# Patient Record
Sex: Female | Born: 1980 | Race: White | Hispanic: No | Marital: Single | Smoking: Current every day smoker
Health system: Southern US, Community
[De-identification: ages and names within clinical notes are randomized; demographics above are authoritative.]

## PROBLEM LIST (undated history)

## (undated) DIAGNOSIS — Z9889 Other specified postprocedural states: Secondary | ICD-10-CM

## (undated) DIAGNOSIS — N3010 Interstitial cystitis (chronic) without hematuria: Secondary | ICD-10-CM

## (undated) DIAGNOSIS — B977 Papillomavirus as the cause of diseases classified elsewhere: Secondary | ICD-10-CM

## (undated) DIAGNOSIS — R0600 Dyspnea, unspecified: Secondary | ICD-10-CM

## (undated) DIAGNOSIS — R569 Unspecified convulsions: Secondary | ICD-10-CM

## (undated) DIAGNOSIS — I10 Essential (primary) hypertension: Secondary | ICD-10-CM

## (undated) DIAGNOSIS — M797 Fibromyalgia: Secondary | ICD-10-CM

## (undated) DIAGNOSIS — E282 Polycystic ovarian syndrome: Secondary | ICD-10-CM

## (undated) DIAGNOSIS — K219 Gastro-esophageal reflux disease without esophagitis: Secondary | ICD-10-CM

## (undated) DIAGNOSIS — G8929 Other chronic pain: Secondary | ICD-10-CM

## (undated) DIAGNOSIS — R109 Unspecified abdominal pain: Secondary | ICD-10-CM

## (undated) DIAGNOSIS — K449 Diaphragmatic hernia without obstruction or gangrene: Secondary | ICD-10-CM

## (undated) DIAGNOSIS — R112 Nausea with vomiting, unspecified: Secondary | ICD-10-CM

## (undated) DIAGNOSIS — R0609 Other forms of dyspnea: Secondary | ICD-10-CM

## (undated) DIAGNOSIS — F419 Anxiety disorder, unspecified: Secondary | ICD-10-CM

## (undated) DIAGNOSIS — G2581 Restless legs syndrome: Secondary | ICD-10-CM

## (undated) DIAGNOSIS — M549 Dorsalgia, unspecified: Secondary | ICD-10-CM

## (undated) DIAGNOSIS — Z9289 Personal history of other medical treatment: Secondary | ICD-10-CM

## (undated) DIAGNOSIS — R609 Edema, unspecified: Secondary | ICD-10-CM

## (undated) DIAGNOSIS — N200 Calculus of kidney: Secondary | ICD-10-CM

## (undated) DIAGNOSIS — K589 Irritable bowel syndrome without diarrhea: Secondary | ICD-10-CM

## (undated) DIAGNOSIS — R51 Headache: Secondary | ICD-10-CM

## (undated) DIAGNOSIS — E119 Type 2 diabetes mellitus without complications: Secondary | ICD-10-CM

## (undated) HISTORY — PX: ESOPHAGOGASTRODUODENOSCOPY ENDOSCOPY: SHX5814

## (undated) HISTORY — PX: TYMPANOPLASTY: SHX33

## (undated) HISTORY — DX: Dyspnea, unspecified: R06.00

## (undated) HISTORY — DX: Other forms of dyspnea: R06.09

## (undated) HISTORY — DX: Edema, unspecified: R60.9

## (undated) HISTORY — PX: CHOLECYSTECTOMY: SHX55

## (undated) HISTORY — PX: TUBAL LIGATION: SHX77

## (undated) HISTORY — PX: TONSILLECTOMY: SUR1361

---

## 1999-04-21 ENCOUNTER — Other Ambulatory Visit: Admission: RE | Admit: 1999-04-21 | Discharge: 1999-04-21 | Payer: Self-pay | Admitting: Obstetrics and Gynecology

## 1999-08-06 ENCOUNTER — Inpatient Hospital Stay (HOSPITAL_COMMUNITY): Admission: AD | Admit: 1999-08-06 | Discharge: 1999-08-06 | Payer: Self-pay | Admitting: *Deleted

## 1999-09-05 ENCOUNTER — Observation Stay (HOSPITAL_COMMUNITY): Admission: AD | Admit: 1999-09-05 | Discharge: 1999-09-06 | Payer: Self-pay | Admitting: Obstetrics and Gynecology

## 1999-09-22 ENCOUNTER — Inpatient Hospital Stay (HOSPITAL_COMMUNITY): Admission: AD | Admit: 1999-09-22 | Discharge: 1999-09-22 | Payer: Self-pay | Admitting: *Deleted

## 1999-09-29 ENCOUNTER — Inpatient Hospital Stay (HOSPITAL_COMMUNITY): Admission: AD | Admit: 1999-09-29 | Discharge: 1999-09-29 | Payer: Self-pay | Admitting: Obstetrics and Gynecology

## 1999-09-30 ENCOUNTER — Inpatient Hospital Stay (HOSPITAL_COMMUNITY): Admission: AD | Admit: 1999-09-30 | Discharge: 1999-09-30 | Payer: Self-pay | Admitting: *Deleted

## 1999-10-17 ENCOUNTER — Observation Stay (HOSPITAL_COMMUNITY): Admission: AD | Admit: 1999-10-17 | Discharge: 1999-10-18 | Payer: Self-pay | Admitting: *Deleted

## 1999-10-21 ENCOUNTER — Inpatient Hospital Stay (HOSPITAL_COMMUNITY): Admission: AD | Admit: 1999-10-21 | Discharge: 1999-10-21 | Payer: Self-pay | Admitting: Obstetrics & Gynecology

## 1999-11-04 ENCOUNTER — Inpatient Hospital Stay (HOSPITAL_COMMUNITY): Admission: AD | Admit: 1999-11-04 | Discharge: 1999-11-04 | Payer: Self-pay | Admitting: Obstetrics and Gynecology

## 1999-11-08 ENCOUNTER — Inpatient Hospital Stay (HOSPITAL_COMMUNITY): Admission: AD | Admit: 1999-11-08 | Discharge: 1999-11-11 | Payer: Self-pay | Admitting: Obstetrics & Gynecology

## 2000-02-28 ENCOUNTER — Encounter: Admission: RE | Admit: 2000-02-28 | Discharge: 2000-02-28 | Payer: Self-pay | Admitting: Obstetrics and Gynecology

## 2000-02-28 ENCOUNTER — Encounter: Payer: Self-pay | Admitting: Obstetrics and Gynecology

## 2001-04-20 ENCOUNTER — Emergency Department (HOSPITAL_COMMUNITY): Admission: EM | Admit: 2001-04-20 | Discharge: 2001-04-20 | Payer: Self-pay | Admitting: Emergency Medicine

## 2001-04-23 ENCOUNTER — Emergency Department (HOSPITAL_COMMUNITY): Admission: EM | Admit: 2001-04-23 | Discharge: 2001-04-23 | Payer: Self-pay | Admitting: Emergency Medicine

## 2001-04-26 ENCOUNTER — Emergency Department (HOSPITAL_COMMUNITY): Admission: EM | Admit: 2001-04-26 | Discharge: 2001-04-26 | Payer: Self-pay | Admitting: Emergency Medicine

## 2002-04-24 ENCOUNTER — Other Ambulatory Visit: Admission: RE | Admit: 2002-04-24 | Discharge: 2002-04-24 | Payer: Self-pay | Admitting: Obstetrics and Gynecology

## 2002-07-08 ENCOUNTER — Ambulatory Visit (HOSPITAL_COMMUNITY): Admission: RE | Admit: 2002-07-08 | Discharge: 2002-07-08 | Payer: Self-pay | Admitting: Obstetrics and Gynecology

## 2002-07-08 ENCOUNTER — Encounter: Payer: Self-pay | Admitting: Obstetrics and Gynecology

## 2002-10-22 ENCOUNTER — Encounter: Payer: Self-pay | Admitting: Obstetrics and Gynecology

## 2002-10-22 ENCOUNTER — Inpatient Hospital Stay (HOSPITAL_COMMUNITY): Admission: AD | Admit: 2002-10-22 | Discharge: 2002-10-22 | Payer: Self-pay | Admitting: Obstetrics and Gynecology

## 2002-10-27 ENCOUNTER — Inpatient Hospital Stay (HOSPITAL_COMMUNITY): Admission: AD | Admit: 2002-10-27 | Discharge: 2002-10-29 | Payer: Self-pay | Admitting: Obstetrics and Gynecology

## 2002-12-11 ENCOUNTER — Ambulatory Visit (HOSPITAL_COMMUNITY): Admission: RE | Admit: 2002-12-11 | Discharge: 2002-12-11 | Payer: Self-pay | Admitting: Obstetrics and Gynecology

## 2003-03-15 ENCOUNTER — Encounter: Payer: Self-pay | Admitting: Emergency Medicine

## 2003-03-15 ENCOUNTER — Emergency Department (HOSPITAL_COMMUNITY): Admission: EM | Admit: 2003-03-15 | Discharge: 2003-03-15 | Payer: Self-pay | Admitting: Emergency Medicine

## 2003-03-27 ENCOUNTER — Inpatient Hospital Stay (HOSPITAL_COMMUNITY): Admission: RE | Admit: 2003-03-27 | Discharge: 2003-03-28 | Payer: Self-pay | Admitting: Family Medicine

## 2003-03-27 ENCOUNTER — Encounter (INDEPENDENT_AMBULATORY_CARE_PROVIDER_SITE_OTHER): Payer: Self-pay

## 2003-03-27 ENCOUNTER — Encounter: Payer: Self-pay | Admitting: Surgery

## 2003-07-07 ENCOUNTER — Encounter: Payer: Self-pay | Admitting: Surgery

## 2003-07-07 ENCOUNTER — Encounter: Admission: RE | Admit: 2003-07-07 | Discharge: 2003-07-07 | Payer: Self-pay | Admitting: Surgery

## 2003-11-14 ENCOUNTER — Emergency Department (HOSPITAL_COMMUNITY): Admission: EM | Admit: 2003-11-14 | Discharge: 2003-11-14 | Payer: Self-pay | Admitting: Emergency Medicine

## 2003-11-25 ENCOUNTER — Emergency Department (HOSPITAL_COMMUNITY): Admission: EM | Admit: 2003-11-25 | Discharge: 2003-11-26 | Payer: Self-pay | Admitting: Emergency Medicine

## 2004-02-19 ENCOUNTER — Emergency Department (HOSPITAL_COMMUNITY): Admission: AD | Admit: 2004-02-19 | Discharge: 2004-02-19 | Payer: Self-pay | Admitting: Family Medicine

## 2004-07-11 ENCOUNTER — Ambulatory Visit (HOSPITAL_COMMUNITY): Admission: RE | Admit: 2004-07-11 | Discharge: 2004-07-11 | Payer: Self-pay | Admitting: Family Medicine

## 2004-11-08 ENCOUNTER — Emergency Department (HOSPITAL_COMMUNITY): Admission: EM | Admit: 2004-11-08 | Discharge: 2004-11-08 | Payer: Self-pay | Admitting: Family Medicine

## 2005-02-23 ENCOUNTER — Ambulatory Visit (HOSPITAL_COMMUNITY): Admission: RE | Admit: 2005-02-23 | Discharge: 2005-02-23 | Payer: Self-pay | Admitting: Family Medicine

## 2008-02-18 ENCOUNTER — Ambulatory Visit: Admission: RE | Admit: 2008-02-18 | Discharge: 2008-02-18 | Payer: Self-pay | Admitting: Family Medicine

## 2008-02-18 ENCOUNTER — Encounter (INDEPENDENT_AMBULATORY_CARE_PROVIDER_SITE_OTHER): Payer: Self-pay | Admitting: Family Medicine

## 2008-02-18 ENCOUNTER — Ambulatory Visit: Payer: Self-pay | Admitting: Vascular Surgery

## 2008-04-22 ENCOUNTER — Encounter: Admission: RE | Admit: 2008-04-22 | Discharge: 2008-04-22 | Payer: Self-pay | Admitting: Rheumatology

## 2008-04-23 ENCOUNTER — Emergency Department (HOSPITAL_COMMUNITY): Admission: EM | Admit: 2008-04-23 | Discharge: 2008-04-23 | Payer: Self-pay | Admitting: Emergency Medicine

## 2008-08-12 ENCOUNTER — Emergency Department (HOSPITAL_COMMUNITY): Admission: EM | Admit: 2008-08-12 | Discharge: 2008-08-12 | Payer: Self-pay | Admitting: Emergency Medicine

## 2008-12-10 ENCOUNTER — Emergency Department (HOSPITAL_COMMUNITY): Admission: EM | Admit: 2008-12-10 | Discharge: 2008-12-10 | Payer: Self-pay | Admitting: Emergency Medicine

## 2009-02-28 ENCOUNTER — Emergency Department (HOSPITAL_COMMUNITY): Admission: EM | Admit: 2009-02-28 | Discharge: 2009-02-28 | Payer: Self-pay | Admitting: Emergency Medicine

## 2009-03-16 ENCOUNTER — Emergency Department (HOSPITAL_COMMUNITY): Admission: EM | Admit: 2009-03-16 | Discharge: 2009-03-16 | Payer: Self-pay | Admitting: Emergency Medicine

## 2009-03-19 ENCOUNTER — Encounter: Admission: RE | Admit: 2009-03-19 | Discharge: 2009-03-19 | Payer: Self-pay | Admitting: Family Medicine

## 2009-03-23 ENCOUNTER — Emergency Department (HOSPITAL_COMMUNITY): Admission: EM | Admit: 2009-03-23 | Discharge: 2009-03-23 | Payer: Self-pay | Admitting: Emergency Medicine

## 2009-05-08 ENCOUNTER — Emergency Department (HOSPITAL_COMMUNITY): Admission: EM | Admit: 2009-05-08 | Discharge: 2009-05-09 | Payer: Self-pay | Admitting: Emergency Medicine

## 2009-05-16 ENCOUNTER — Emergency Department (HOSPITAL_COMMUNITY): Admission: EM | Admit: 2009-05-16 | Discharge: 2009-05-17 | Payer: Self-pay | Admitting: Emergency Medicine

## 2009-06-18 ENCOUNTER — Ambulatory Visit (HOSPITAL_COMMUNITY): Admission: RE | Admit: 2009-06-18 | Discharge: 2009-06-18 | Payer: Self-pay | Admitting: Urology

## 2009-08-20 ENCOUNTER — Emergency Department (HOSPITAL_COMMUNITY): Admission: EM | Admit: 2009-08-20 | Discharge: 2009-08-20 | Payer: Self-pay | Admitting: Emergency Medicine

## 2009-08-24 ENCOUNTER — Emergency Department (HOSPITAL_COMMUNITY): Admission: EM | Admit: 2009-08-24 | Discharge: 2009-08-24 | Payer: Self-pay | Admitting: Emergency Medicine

## 2009-10-13 ENCOUNTER — Emergency Department (HOSPITAL_COMMUNITY): Admission: EM | Admit: 2009-10-13 | Discharge: 2009-10-13 | Payer: Self-pay | Admitting: Emergency Medicine

## 2009-11-30 ENCOUNTER — Emergency Department (HOSPITAL_COMMUNITY): Admission: EM | Admit: 2009-11-30 | Discharge: 2009-11-30 | Payer: Self-pay | Admitting: Emergency Medicine

## 2010-02-13 ENCOUNTER — Emergency Department (HOSPITAL_COMMUNITY): Admission: EM | Admit: 2010-02-13 | Discharge: 2010-02-13 | Payer: Self-pay | Admitting: Emergency Medicine

## 2010-02-21 ENCOUNTER — Emergency Department (HOSPITAL_COMMUNITY): Admission: EM | Admit: 2010-02-21 | Discharge: 2010-02-21 | Payer: Self-pay | Admitting: Emergency Medicine

## 2010-03-21 ENCOUNTER — Emergency Department (HOSPITAL_COMMUNITY): Admission: EM | Admit: 2010-03-21 | Discharge: 2010-03-21 | Payer: Self-pay | Admitting: Emergency Medicine

## 2010-03-26 ENCOUNTER — Emergency Department (HOSPITAL_COMMUNITY): Admission: EM | Admit: 2010-03-26 | Discharge: 2010-03-26 | Payer: Self-pay | Admitting: Emergency Medicine

## 2010-04-16 ENCOUNTER — Encounter: Admission: RE | Admit: 2010-04-16 | Discharge: 2010-04-16 | Payer: Self-pay | Admitting: Internal Medicine

## 2010-07-18 ENCOUNTER — Emergency Department (HOSPITAL_COMMUNITY): Admission: EM | Admit: 2010-07-18 | Discharge: 2010-07-18 | Payer: Self-pay | Admitting: Emergency Medicine

## 2010-07-28 ENCOUNTER — Encounter: Admission: RE | Admit: 2010-07-28 | Discharge: 2010-07-28 | Payer: Self-pay | Admitting: Family Medicine

## 2010-08-18 ENCOUNTER — Emergency Department (HOSPITAL_COMMUNITY): Admission: EM | Admit: 2010-08-18 | Discharge: 2010-08-18 | Payer: Self-pay | Admitting: Emergency Medicine

## 2010-08-29 ENCOUNTER — Emergency Department (HOSPITAL_COMMUNITY): Admission: EM | Admit: 2010-08-29 | Discharge: 2010-08-29 | Payer: Self-pay | Admitting: Emergency Medicine

## 2010-09-15 ENCOUNTER — Emergency Department (HOSPITAL_COMMUNITY): Admission: EM | Admit: 2010-09-15 | Discharge: 2010-09-15 | Payer: Self-pay | Admitting: Emergency Medicine

## 2010-09-23 ENCOUNTER — Ambulatory Visit (HOSPITAL_BASED_OUTPATIENT_CLINIC_OR_DEPARTMENT_OTHER): Admission: RE | Admit: 2010-09-23 | Discharge: 2010-09-23 | Payer: Self-pay | Admitting: Urology

## 2010-11-30 ENCOUNTER — Encounter
Admission: RE | Admit: 2010-11-30 | Discharge: 2010-12-20 | Payer: Self-pay | Source: Home / Self Care | Attending: Physical Medicine & Rehabilitation | Admitting: Physical Medicine & Rehabilitation

## 2010-12-02 ENCOUNTER — Ambulatory Visit
Admission: RE | Admit: 2010-12-02 | Discharge: 2010-12-02 | Payer: Self-pay | Source: Home / Self Care | Attending: Physical Medicine & Rehabilitation | Admitting: Physical Medicine & Rehabilitation

## 2011-01-30 ENCOUNTER — Emergency Department (HOSPITAL_COMMUNITY): Payer: Medicaid Other

## 2011-01-30 ENCOUNTER — Emergency Department (HOSPITAL_COMMUNITY)
Admission: EM | Admit: 2011-01-30 | Discharge: 2011-01-30 | Disposition: A | Discharge status: Home / Self Care | Payer: Medicaid Other | Attending: Emergency Medicine | Admitting: Emergency Medicine

## 2011-01-30 DIAGNOSIS — M79609 Pain in unspecified limb: Secondary | ICD-10-CM | POA: Insufficient documentation

## 2011-01-30 DIAGNOSIS — G8929 Other chronic pain: Secondary | ICD-10-CM | POA: Insufficient documentation

## 2011-01-30 DIAGNOSIS — IMO0001 Reserved for inherently not codable concepts without codable children: Secondary | ICD-10-CM | POA: Insufficient documentation

## 2011-01-31 LAB — POCT I-STAT 4, (NA,K, GLUC, HGB,HCT): Hemoglobin: 15 g/dL (ref 12.0–15.0)

## 2011-02-01 LAB — URINE CULTURE: Culture  Setup Time: 201110271158

## 2011-02-01 LAB — URINALYSIS, ROUTINE W REFLEX MICROSCOPIC
Bilirubin Urine: NEGATIVE
Leukocytes, UA: NEGATIVE
Nitrite: POSITIVE — AB
Protein, ur: NEGATIVE mg/dL
Specific Gravity, Urine: 1.01 (ref 1.005–1.030)
pH: 5.5 (ref 5.0–8.0)

## 2011-02-01 LAB — POCT PREGNANCY, URINE: Preg Test, Ur: NEGATIVE

## 2011-02-01 LAB — URINE MICROSCOPIC-ADD ON

## 2011-02-02 LAB — URINALYSIS, ROUTINE W REFLEX MICROSCOPIC
Hgb urine dipstick: NEGATIVE
Nitrite: NEGATIVE
Specific Gravity, Urine: 1.025 (ref 1.005–1.030)

## 2011-02-02 LAB — POCT PREGNANCY, URINE: Preg Test, Ur: NEGATIVE

## 2011-02-02 LAB — URINE MICROSCOPIC-ADD ON

## 2011-02-07 LAB — URINALYSIS, ROUTINE W REFLEX MICROSCOPIC
Bilirubin Urine: NEGATIVE
Hgb urine dipstick: NEGATIVE
Ketones, ur: NEGATIVE mg/dL
Protein, ur: NEGATIVE mg/dL
Urobilinogen, UA: 0.2 mg/dL (ref 0.0–1.0)

## 2011-02-07 LAB — URINE CULTURE: Colony Count: 9000

## 2011-02-07 LAB — PREGNANCY, URINE: Preg Test, Ur: NEGATIVE

## 2011-02-08 LAB — POCT I-STAT, CHEM 8
Hemoglobin: 16.3 g/dL — ABNORMAL HIGH (ref 12.0–15.0)
Sodium: 137 mEq/L (ref 135–145)
TCO2: 26 mmol/L (ref 0–100)

## 2011-02-08 LAB — POCT CARDIAC MARKERS: Myoglobin, poc: 46.6 ng/mL (ref 12–200)

## 2011-02-22 ENCOUNTER — Other Ambulatory Visit (HOSPITAL_COMMUNITY): Payer: Self-pay | Admitting: Physician Assistant

## 2011-02-22 ENCOUNTER — Ambulatory Visit (HOSPITAL_COMMUNITY)
Admission: RE | Admit: 2011-02-22 | Discharge: 2011-02-22 | Disposition: A | Discharge status: Home / Self Care | Payer: Medicaid Other | Source: Ambulatory Visit | Attending: Physician Assistant | Admitting: Physician Assistant

## 2011-02-22 ENCOUNTER — Emergency Department (HOSPITAL_COMMUNITY)
Admission: EM | Admit: 2011-02-22 | Discharge: 2011-02-22 | Disposition: A | Discharge status: Home / Self Care | Payer: Medicaid Other | Attending: Emergency Medicine | Admitting: Emergency Medicine

## 2011-02-22 DIAGNOSIS — Y9311 Activity, swimming: Secondary | ICD-10-CM | POA: Insufficient documentation

## 2011-02-22 DIAGNOSIS — S60229A Contusion of unspecified hand, initial encounter: Secondary | ICD-10-CM | POA: Insufficient documentation

## 2011-02-22 DIAGNOSIS — Z886 Allergy status to analgesic agent status: Secondary | ICD-10-CM | POA: Insufficient documentation

## 2011-02-22 DIAGNOSIS — W1809XA Striking against other object with subsequent fall, initial encounter: Secondary | ICD-10-CM | POA: Insufficient documentation

## 2011-02-22 DIAGNOSIS — Z880 Allergy status to penicillin: Secondary | ICD-10-CM | POA: Insufficient documentation

## 2011-02-22 DIAGNOSIS — Z79899 Other long term (current) drug therapy: Secondary | ICD-10-CM | POA: Insufficient documentation

## 2011-02-22 DIAGNOSIS — G8929 Other chronic pain: Secondary | ICD-10-CM | POA: Insufficient documentation

## 2011-02-22 DIAGNOSIS — N3010 Interstitial cystitis (chronic) without hematuria: Secondary | ICD-10-CM | POA: Insufficient documentation

## 2011-02-22 DIAGNOSIS — T1490XA Injury, unspecified, initial encounter: Secondary | ICD-10-CM

## 2011-02-22 DIAGNOSIS — R609 Edema, unspecified: Secondary | ICD-10-CM

## 2011-02-22 DIAGNOSIS — E282 Polycystic ovarian syndrome: Secondary | ICD-10-CM | POA: Insufficient documentation

## 2011-02-22 DIAGNOSIS — Y999 Unspecified external cause status: Secondary | ICD-10-CM | POA: Insufficient documentation

## 2011-02-22 DIAGNOSIS — Y9239 Other specified sports and athletic area as the place of occurrence of the external cause: Secondary | ICD-10-CM | POA: Insufficient documentation

## 2011-02-22 DIAGNOSIS — E669 Obesity, unspecified: Secondary | ICD-10-CM | POA: Insufficient documentation

## 2011-02-22 DIAGNOSIS — K219 Gastro-esophageal reflux disease without esophagitis: Secondary | ICD-10-CM | POA: Insufficient documentation

## 2011-02-22 DIAGNOSIS — M79609 Pain in unspecified limb: Secondary | ICD-10-CM | POA: Insufficient documentation

## 2011-02-22 DIAGNOSIS — K589 Irritable bowel syndrome without diarrhea: Secondary | ICD-10-CM | POA: Insufficient documentation

## 2011-02-22 DIAGNOSIS — IMO0001 Reserved for inherently not codable concepts without codable children: Secondary | ICD-10-CM | POA: Insufficient documentation

## 2011-02-22 DIAGNOSIS — M7989 Other specified soft tissue disorders: Secondary | ICD-10-CM | POA: Insufficient documentation

## 2011-02-23 LAB — URINALYSIS, ROUTINE W REFLEX MICROSCOPIC
Bilirubin Urine: NEGATIVE
Glucose, UA: NEGATIVE mg/dL
Ketones, ur: NEGATIVE mg/dL
Leukocytes, UA: NEGATIVE
pH: 5 (ref 5.0–8.0)

## 2011-02-23 LAB — URINE MICROSCOPIC-ADD ON

## 2011-02-26 LAB — HEMOGLOBIN AND HEMATOCRIT, BLOOD: Hemoglobin: 15.5 g/dL — ABNORMAL HIGH (ref 12.0–15.0)

## 2011-02-26 LAB — PREGNANCY, URINE: Preg Test, Ur: NEGATIVE

## 2011-02-27 LAB — STREP A DNA PROBE

## 2011-02-27 LAB — RAPID STREP SCREEN (MED CTR MEBANE ONLY): Streptococcus, Group A Screen (Direct): NEGATIVE

## 2011-02-28 LAB — URINALYSIS, ROUTINE W REFLEX MICROSCOPIC
Glucose, UA: NEGATIVE mg/dL
Leukocytes, UA: NEGATIVE
Nitrite: NEGATIVE
Protein, ur: NEGATIVE mg/dL
Urobilinogen, UA: 0.2 mg/dL (ref 0.0–1.0)

## 2011-02-28 LAB — URINE MICROSCOPIC-ADD ON

## 2011-02-28 LAB — PREGNANCY, URINE: Preg Test, Ur: NEGATIVE

## 2011-03-01 LAB — CBC
HCT: 46 % (ref 36.0–46.0)
Hemoglobin: 16 g/dL — ABNORMAL HIGH (ref 12.0–15.0)
RBC: 5.42 MIL/uL — ABNORMAL HIGH (ref 3.87–5.11)
WBC: 9.2 10*3/uL (ref 4.0–10.5)

## 2011-03-01 LAB — URINALYSIS, ROUTINE W REFLEX MICROSCOPIC
Bilirubin Urine: NEGATIVE
Glucose, UA: NEGATIVE mg/dL
Ketones, ur: 15 mg/dL — AB
Protein, ur: >300 mg/dL — AB
pH: 6.5 (ref 5.0–8.0)

## 2011-03-01 LAB — URINE MICROSCOPIC-ADD ON

## 2011-03-01 LAB — RAPID STREP SCREEN (MED CTR MEBANE ONLY): Streptococcus, Group A Screen (Direct): NEGATIVE

## 2011-04-04 NOTE — Op Note (Signed)
NAMERENLEY, GUTMAN              ACCOUNT NO.:  [id number]   MEDICAL RECORD NO.:  [id number]          PATIENT TYPE:  AMB   LOCATION:  DAY                          FACILITY:  [facility]   PHYSICIAN:  Maretta Bees. Vonita Moss, M.D.DATE OF BIRTH:  [birth year]   DATE OF PROCEDURE:  06/18/2009  DATE OF DISCHARGE:                               OPERATIVE REPORT   Preop Diagnosis: Left Ureteral Stone  Postop Diagnosis: Same  Procedure: Cystoscopy and Left Ureteroscopy  Surgeon: Dr Larey Dresser  Anesthesia: General   This 30 year old lady has had several weeks of left lower abdominal pain  due to a stone.  She was found to have a stone at the left UPJ in May  2010, and has been treated with pain medications and recently Rapaflo.  CT scan done on June 08, 2009, showed a 6 x 3 mm stone at the left UVJ  and a tiny nonobstructing left renal calculus.  Because of persistent  symptoms and chronic narcotic usage, the patient requested operative  intervention.  She has been having pain up until now and has not seen a  stone pass.   PROCEDURE:  The patient was brought to the operating room and placed in  lithotomy position.  The external genitalia prepped and draped in the  usual fashion.  She was cystoscoped and the bladder was normal with no  stones.  I could not see a definite stone under fluoroscopy, but that  did not mean that there was not a stone.  Therefore, I inserted a  retractable core guidewire up the left ureter without difficulty.  I  then dilated the intramural ureter with the inner core of an ureteral  access sheath.  I then performed rigid ureteroscopy and saw no evidence  of a stone.  I inserted the scope all the way up to the very upper  ureter.  No proximal stones were seen.  There was no significant edema  or irritation in the ureteral mucosa.  I think it would be unlikely that  the stone was pushed back up in the kidney with the guidewire.  At this  point, I looked my way out and there  was no evidence of any trauma or  injury to the ureter and I felt it best probably just to leave out a  double-J catheter since they can be symptomatic in and of themselves.  At this point, the bladder was reexamined to see if there was a stone  and there was not.  The bladder was emptied, the scope removed and the  patient sent to the recovery room in good condition having tolerated the  procedure well.      Maretta Bees. Vonita Moss, M.D.  Electronically Signed     LJP/MEDQ  D:  06/18/2009  T:  06/18/2009  Job:  [id number]

## 2011-04-07 NOTE — Op Note (Signed)
NAME:  Isabella Buchanan, Isabella Buchanan                        ACCOUNT NO.:  [id number]   MEDICAL RECORD NO.:  [id number]                   PATIENT TYPE:  AMB   LOCATION:  SDC                                  FACILITY:  [facility]   PHYSICIAN:  Hal Morales, M.D.             DATE OF BIRTH:  1981/05/28   DATE OF PROCEDURE:  12/11/2002  DATE OF DISCHARGE:                                 OPERATIVE REPORT   PREOPERATIVE DIAGNOSIS:  Desire for surgical sterilization.   POSTOPERATIVE DIAGNOSIS:  Desire for surgical sterilization.   PROCEDURE:  Laparoscopic tubal cautery.   SURGEON:  Hal Morales, M.D.   ANESTHESIA:  General orotracheal and local.   ESTIMATED BLOOD LOSS:  Less than 25 mL.   COMPLICATIONS:  Initial  preperitoneal trocar placement.   FINDINGS:  The uterus, tubes, and ovaries were within normal limits.   DESCRIPTION OF PROCEDURE:  The patient was taken to the operating room after  appropriate identification and placed on the operating table.  After the  attainment of adequate general anesthesia, she was placed in the modified  lithotomy position.  The abdomen, perineum, and vagina were prepped with  multiple layers of Betadine and a red Robinson catheter used to empty the  bladder.  A single-tooth tenaculum was placed on the anterior cervix.  The  abdomen was draped as a sterile field.  The subumbilical area was  infiltrated with 0.25% Marcaine for a total of 7 mL.  Two areas on either  side of midline suprapubically were likewise infiltrated with a total of 3  mL of 0.25% Marcaine.  A subumbilical incision was made and the Veress  cannula placed through that incision into the peritoneal cavity.  A  pneumoperitoneum was created with 3 L of CO2.  The Veress cannula was  removed and the laparoscopic trocar placed.  The laparoscope was placed with  the trocar sleeve and though it could be seen that a pneumoperitoneum  existed, the trocar had not progressed completely to the  pneumoperitoneum.  The laparoscope was thus removed, as was the trocar, and the trocar  replaced, this time within the peritoneal cavity.  The laparoscope was  placed through the trocar sleeve into the peritoneal cavity.  The right  fallopian tube was identified, followed to its fimbriated end, then grasped  at the isthmic portion and cauterized in two adjacent areas.  A similar  procedure was carried out on the opposite side.  Copious irrigation was  carried out.  It was noted that there was some bleeding from the abdominal  incision site that was dripping along the laparoscope.  This was identified.  The peritoneum was copiously irrigated to make sure that no intraperitoneal  bleeding was occurring and approximately 60 mL of warm lactated Ringer's  left in the peritoneal cavity.  The CO2 was then allowed to escape and the  laparoscope and trocar sleeve removed under direct visualization.  A deep  fascial suture of 0 Vicryl was then placed in the subumbilical incision to  allow for hemostasis at the incision site.  The incision edges were  approximated with subcuticular sutures  of 3-0 Vicryl.  A sterile dressing was applied and the single-tooth  tenaculum removed from the anterior cervix.  The patient was awakened from  general anesthesia and taken to the recovery room in satisfactory condition,  having tolerated the procedure well with sponge and instrument counts  correct.                                               Hal Morales, M.D.    VPH/MEDQ  D:  12/11/2002  T:  12/11/2002  Job:  [id number]

## 2011-04-07 NOTE — Op Note (Signed)
NAME:  Isabella Buchanan, Isabella Buchanan                        ACCOUNT NO.:  [id number]   MEDICAL RECORD NO.:  [id number]                   PATIENT TYPE:  INP   LOCATION:  0451                                 FACILITY:  [facility]   PHYSICIAN:  Thornton Park. Daphine Deutscher, M.D.             DATE OF BIRTH:  17-Mar-1981   DATE OF PROCEDURE:  03/27/2003  DATE OF DISCHARGE:  03/28/2003                                 OPERATIVE REPORT   PREOPERATIVE DIAGNOSES:  Gallstones and chronic cholecystitis.   POSTOPERATIVE DIAGNOSES:  Gallstones and chronic cholecystitis.   PROCEDURE:  Laparoscopic cholecystectomy with intraoperative cholangiogram.   SURGEON:  Thornton Park. Daphine Deutscher, M.D.   ASSISTANT:  Sandria Bales. Ezzard Standing, M.D.   DESCRIPTION OF PROCEDURE:  [name] is a 30 year old lady who had been  seen in the ED earlier this month with right upper quadrant abdominal pain  with gallstones on an ultrasound and slightly elevated liver function  studies. She has managed to have recurrent bouts of upper abdominal pain.   Informed consent was obtained once again in the holding prior to taking her  back regarding potential complications not limited to common duct injury,  bleeding, bile leaks and the need to do it open.   Taken back to the OR and given general anesthesia. The whole abdomen was  prepped with Betadine and draped sterilely. I infiltrated the umbilical  region with Marcaine and made a longitudinal incision into a previously made  tubal ligation site. Using Hasson technique, I entered the abdomen,  insufflated and placed three trocars in the upper abdomen. The gallbladder  was grasped and elevated and I dissected free Calot's triangle. There was a  large plump Calot's node. I clipped upon the gallbladder and incised that  and did a dynamic cholangiogram showing free flow into the duodenum and good  intrahepatic filling. The cystic duct was then triple clipped, divided,  cystic artery was triple clipped divided. This  Calot's node was sent along  with the specimen. I removed the gallbladder from the gallbladder bed with  the hook cautery without entering it. It was then easily brought out through  the umbilicus. I went back in and looked at the gallbladder bed and no  bleeding or bile leaks were noted. The umbilical defect was repaired with a  figure-of-eight suture of #0 Vicryl under laparoscopic vision. This was tied  down. The abdomen was then reinspected and the port sites were reinspected  as the trocars were withdrawn. The skin was closed with 4-0 Monocryl with  Benzoin and Steri-Strips. The patient seemed to tolerate the procedure well  and was taken to the recovery room in satisfactory condition.                                               Molli Hazard  Hortencia Conradi, M.D.    MBM/MEDQ  D:  03/27/2003  T:  03/30/2003  Job:  [id number]

## 2011-04-07 NOTE — H&P (Signed)
NAME:  Isabella Buchanan, Isabella Buchanan                        ACCOUNT NO.:  [id number]   MEDICAL RECORD NO.:  [id number]                   PATIENT TYPE:  AMB   LOCATION:  SDC                                  FACILITY:  [facility]   PHYSICIAN:  Hal Morales, M.D.             DATE OF BIRTH:  1980-12-11   DATE OF ADMISSION:  DATE OF DISCHARGE:                                HISTORY & PHYSICAL   HISTORY OF PRESENT ILLNESS:  The patient is a 30 year old white female Para  2, 0/0/2, who presents for surgical sterilization. She underwent a normal  spontaneous vaginal delivery on October 27, 2002 and did well post partum.  She had her last menstrual period prior to her delivery. She presents for  surgical sterilization, having considered other options for birth control.   PAST OB HISTORY:  In 2000, the patient delivered an 8 pound 2 ounce female  infant at 70 1/2 weeks, complicated by third degree laceration but did well  subsequent to that. She then delivered a 6 pound 2 ounce female infant on  October 27, 2002 without difficulty.   PAST MEDICAL HISTORY:  The patient has a history of interstitial cystitis  and depression.   PAST SURGICAL HISTORY:  Tonsillectomy and adenoidectomy in 1994 and  tympanoplasty in 1994.   FAMILY HISTORY:  Positive for heart disease, chronic hypertension, asthma,  diabetes mellitus, cancer, and drug use.   SOCIAL HISTORY:  The patient takes a regular diet. She does smoke 1/2 to 1  pack per day of cigarettes per day. She is currently unemployed.   REVIEW OF SYSTEMS:  Positive for some epigastric burning pain which has  recently been treated with Nexium with good relief. The patient also has  some suprapubic discomfort, thought to be secondary to interstitial  cystitis.   CURRENT MEDICATIONS:  Nexium 40 mg per day.   ALLERGIES:  SULFA, AMOXICILLIN, PENICILLIN.   PHYSICAL EXAMINATION:  GENERAL: A well developed, well nourished  white  female in no acute distress.  VITAL  SIGNS: Blood pressure 140/80.  LUNGS: Clear.  HEART: Regular rate and rhythm.  ABDOMEN: Benign.  PELVIC: Within normal limits. Vagina rugae. Cervix without lesions. Uterus  normal size, shape, and consistency. Anterior mobile and nontender. Adnexa  no masses.   IMPRESSION:  1. Desire for surgical sterilization.  2. Gastroesophageal reflux disease.  3. Cigarette use.   DISPOSITION:  Several discussions were held with the patient concerning  indications for her procedure as well as options for other birth control  methods which include barrier methods, oral contraceptive pills, other  hormonal methods, intrauterine contraceptive device and surgical  sterilization for her partner. She wishes to proceed with surgical  sterilization for herself and understands the risks of anesthesia, bleeding,  infection, damage to adjacent organs and the small risk of failure of the  tubal sterilization resulting in subsequent pregnancy. She wishes to proceed  with tubal sterilization by  laparoscopic tubal cautery at [facility]  on December 11, 2002.                                                Hal Morales, M.D.    VPH/MEDQ  D:  12/10/2002  T:  12/10/2002  Job:  [id number]

## 2011-04-07 NOTE — H&P (Signed)
NAME:  Isabella Buchanan, Isabella Buchanan                        ACCOUNT NO.:  [id number]   MEDICAL RECORD NO.:  [id number]                   PATIENT TYPE:  INP   LOCATION:  9164                                 FACILITY:  [facility]   PHYSICIAN:  Hal Morales, M.D.             DATE OF BIRTH:  [birth year]   DATE OF ADMISSION:  10/27/2002  DATE OF DISCHARGE:                                HISTORY & PHYSICAL   HISTORY OF PRESENT ILLNESS:  The patient is a 30 year old single white  female gravida 2, para 1-0-0-1 at 57 and 4/7 weeks by ultrasound and LMP who  presents complaining of uterine contractions every three to five minutes all  night and all morning.  She denies any leaking or vaginal bleeding.  She  denies any nausea, vomiting, headaches, or visual disturbances.  She reports  that she was 3 cm at her previous examination.  She is requesting an  epidural for labor.  She desires a bilateral tubal ligation, but will  probably have it done at postpartum visit.  Her pregnancy has been followed  at [facility] by the certified nurse midwife service and has  been essentially uncomplicated, though at risk for history of preterm labor  with full-term delivery in the past, history of smoking with this pregnancy,  history of interstitial cystitis, history of depression.  Her group B Strep  is negative.   OB/GYN HISTORY:  She is a gravida 2, para 1-0-0-1 who delivered a viable  female infant in December 2000 that weighed 8 pounds 2 ounces at 37-1/[redacted]  weeks gestation following a 16-hour labor.  She has a history requiring a  vacuum for that delivery.  She had a third degree laceration with that  delivery also.   ALLERGIES:  She is allergic to SULFA, PENICILLIN, and AMOXICILLIN.   PAST MEDICAL HISTORY:  She reports occasional yeast infections.  She reports  having the usual childhood diseases.  She reports occasional urinary tract  infection with a history of interstitial cystitis, occasional  pyelonephritis, history of depression in the past and had been on Wellbutrin  in the past.  Smoking.  She smokes about half a pack a day.  She has had  surgery with a right tympanoplasty in 1994 and her tonsils and adenoids were  out also in 1994.   FAMILY HISTORY:  Significant for maternal great grandfather with MI.  Maternal grandfather with MI.  Cousins with irregular heartbeat.  Multiple  relatives with hypertension.  Cousin with asthma.  Multiple relatives with  diabetes.  Maternal grandfather leukemia.  Maternal grandmother with  lymphoma.  Maternal great grandmother with breast cancer.   GENETIC HISTORY:  Essentially negative, though she has a cousin with some  kind of congenital heart defect.   SOCIAL HISTORY:  She is single.  High school educated.  Her current partner  is different than the father of her last baby.  His name  is Ray and he is  supportive.  They deny any religious affiliation affecting this pregnancy.   PRENATAL LABORATORIES:  Her blood type is O+.  Antibody screen is negative.  Syphilis is nonreactive.  Rubella is immune.  Hepatitis B surface antigen is  negative.  HIV is negative.  RPR at 28 weeks is negative.  Cystic fibrosis  screen was negative.  Gonorrhea and Chlamydia are all negative.  Her one-  hour Glucola was slightly elevated but her three-hour GTT was within normal  range.  Her group B Strep was negative.   PHYSICAL EXAMINATION:  VITAL SIGNS:  Vital signs are stable.  She is  afebrile.  HEENT:  Grossly within normal limits.  HEART:  Regular rhythm and rate.  CHEST:  Clear.  BREASTS:  Soft and nontender.  ABDOMEN:  Gravid with uterine contractions every three to five minutes.  Her  fetal heart rate is reactive and reassuring.  PELVIC:  5-6 cm, 80%, vertex, -2 per Chip Boer L. Emilee Hero, C.N.M. at [facility].  EXTREMITIES:  Within normal limits.   ASSESSMENT:  1. Intrauterine pregnancy at 37-1/2 weeks.  2. Active labor.  3. Negative  group B Strep.  4. Desires epidural for labor.   PLAN:  Admit to labor and delivery, to follow routine standard orders.  The  patient may have an epidural for labor.  Will notify Hal Morales, M.D.  of patient's admission.     Concha Pyo. Duplantis, C.N.M.              Hal Morales, M.D.    SJD/MEDQ  D:  10/27/2002  T:  10/27/2002  Job:  [id number]

## 2011-04-07 NOTE — Consult Note (Signed)
NAME:  Isabella Buchanan, Isabella Buchanan                        ACCOUNT NO.:  [id number]   MEDICAL RECORD NO.:  [id number]                   PATIENT TYPE:  EMS   LOCATION:  ED                                   FACILITY:  [facility]   PHYSICIAN:  Thornton Park. Daphine Deutscher, M.D.             DATE OF BIRTH:  [birth year]   DATE OF CONSULTATION:  03/15/2003  DATE OF DISCHARGE:                                   CONSULTATION   CHIEF COMPLAINT:  Upper abdominal pain.   HISTORY OF PRESENT ILLNESS:  The patient is a 30 year old white female who  is about four months' postpartum, who has been having episodic midepigastric  pain with nausea and vomiting.  This can be moderate to severe.  She had an  attack in midday today, March 15, 2003.  She had no pain medications, and  the pain was described as epigastric radiating through to her back.  She was  seen in the emergency department, where an ultrasound was obtained which I  reviewed, and which shows gallstones.  Her common duct is not dilated.   PAST MEDICAL HISTORY:  Positive for interstitial cystitis for which she sees  Dr. Marcelyn Bruins.   PAST SURGICAL HISTORY:  Bilateral tubal ligation.   MEDICATIONS:  She was given Nexium which was stopped in February 2004.   ALLERGIES:  1. SULFA.  2. AMOXICILLIN.  3. PENICILLIN.   SOCIAL HISTORY:  Positive for smoking.  The patient is unemployed.   REVIEW OF SYSTEMS:  Negative for dark urine.  The last menstrual period was  February 11, 2003.  Negative for fevers or chills, headache, sore throat,  blurred vision.   PHYSICAL EXAMINATION:  GENERAL:  The patient is sitting up and is  comfortable at this time, 2030 hours on Sunday night, March 15, 2003.  She  indicates that her pain has passed.  She does have some residual soreness in  her right upper quadrant.  VITAL SIGNS:  Temperature has been 97.8, pulse 54, respirations 20, blood  pressure 112/65.   LABORATORY DATA:  Includes a urine pregnancy test which was negative.   White  count 17,700, hemoglobin 14.6, normal platelet count.  Lipase is 22 which is  normal.  Electrolytes are normal.  Creatinine 0.9.  Liver function studies  include SGOT elevated at 231 (0-37), SGPT 102 (0-40), alkaline phosphatase  177 (normal 39-117), total bilirubin 0.6 mg/dl (0.4-5.4).   I have discussed laparoscopic cholecystectomy with her and she wants to go  home and schedule this through our office.  I will notify our office to  expect her call and will try to set her up with the first available surgeon  who can  perform this when she calls and gives [place] some times.  She does have two  small children at home and needs to make some arrangements there.   Will give her a prescription for Tylox (20) to take for pain  along with a  card and a number to call.                                                   Thornton Park Daphine Deutscher, M.D.    MBM/MEDQ  D:  03/15/2003  T:  03/15/2003  Job:  [id number]   cc:   [facility]   [facility]
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
## 2011-04-28 ENCOUNTER — Emergency Department (HOSPITAL_COMMUNITY): Payer: Medicaid Other

## 2011-04-28 ENCOUNTER — Emergency Department (HOSPITAL_COMMUNITY)
Admission: EM | Admit: 2011-04-28 | Discharge: 2011-04-28 | Disposition: A | Discharge status: Home / Self Care | Payer: Medicaid Other | Attending: Emergency Medicine | Admitting: Emergency Medicine

## 2011-04-28 DIAGNOSIS — K589 Irritable bowel syndrome without diarrhea: Secondary | ICD-10-CM | POA: Insufficient documentation

## 2011-04-28 DIAGNOSIS — R071 Chest pain on breathing: Secondary | ICD-10-CM | POA: Insufficient documentation

## 2011-04-28 DIAGNOSIS — J189 Pneumonia, unspecified organism: Secondary | ICD-10-CM | POA: Insufficient documentation

## 2011-04-28 DIAGNOSIS — R05 Cough: Secondary | ICD-10-CM | POA: Insufficient documentation

## 2011-04-28 DIAGNOSIS — R059 Cough, unspecified: Secondary | ICD-10-CM | POA: Insufficient documentation

## 2011-05-27 DIAGNOSIS — G8929 Other chronic pain: Secondary | ICD-10-CM | POA: Insufficient documentation

## 2011-05-27 DIAGNOSIS — M549 Dorsalgia, unspecified: Secondary | ICD-10-CM | POA: Insufficient documentation

## 2011-05-27 DIAGNOSIS — IMO0001 Reserved for inherently not codable concepts without codable children: Secondary | ICD-10-CM | POA: Insufficient documentation

## 2011-05-28 ENCOUNTER — Encounter: Payer: Self-pay | Admitting: *Deleted

## 2011-05-28 ENCOUNTER — Emergency Department (HOSPITAL_COMMUNITY)
Admission: EM | Admit: 2011-05-28 | Discharge: 2011-05-28 | Disposition: A | Discharge status: Home / Self Care | Payer: Medicaid Other | Attending: Emergency Medicine | Admitting: Emergency Medicine

## 2011-05-28 DIAGNOSIS — G8929 Other chronic pain: Secondary | ICD-10-CM

## 2011-05-28 HISTORY — DX: Fibromyalgia: M79.7

## 2011-05-28 HISTORY — DX: Restless legs syndrome: G25.81

## 2011-05-28 HISTORY — DX: Dorsalgia, unspecified: M54.9

## 2011-05-28 HISTORY — DX: Calculus of kidney: N20.0

## 2011-05-28 HISTORY — DX: Other chronic pain: G89.29

## 2011-05-28 HISTORY — DX: Anxiety disorder, unspecified: F41.9

## 2011-05-28 MED ORDER — HYDROMORPHONE HCL 2 MG/ML IJ SOLN
2.0000 mg | Freq: Once | INTRAMUSCULAR | Status: AC
  Administered 2011-05-28: 2 mg via INTRAMUSCULAR
  Filled 2011-05-28: qty 1

## 2011-05-28 MED ORDER — ONDANSETRON 8 MG PO TBDP
8.0000 mg | ORAL_TABLET | Freq: Once | ORAL | Status: AC
  Administered 2011-05-28: 8 mg via ORAL
  Filled 2011-05-28: qty 1

## 2011-05-28 NOTE — ED Provider Notes (Addendum)
History     Chief Complaint  Patient presents with  . Back Pain    pt c/o lower back pain radiating down lower left leg x 1 wk.   Patient is a 30 y.o. female presenting with back pain. The history is provided by the patient.  Back Pain  This is a chronic problem. Episode onset: more than a year ago. The problem occurs constantly. The problem has been gradually worsening. The pain is associated with no known injury. The pain is present in the lumbar spine. The pain radiates to the left thigh. The pain is severe. The symptoms are aggravated by bending, twisting and certain positions. Pertinent negatives include no fever, no numbness, no perianal numbness, no bladder incontinence, no dysuria, no pelvic pain, no paresthesias, no paresis and no weakness.    Past Medical History  Diagnosis Date  . Anxiety   . Fibromyalgia   . Kidney stones   . Restless leg   . Chronic back pain     Past Surgical History  Procedure Date  . Cholecystectomy   . Tonsillectomy   . Tubal ligation     Family History  Problem Relation Age of Onset  . Hypertension Father   . Diabetes Father   . Cancer Other   . Diabetes Other     History  Substance Use Topics  . Smoking status: Current Everyday Smoker -- 1.0 packs/day  . Smokeless tobacco: Not on file  . Alcohol Use: No    OB History    Grav Para Term Preterm Abortions TAB SAB Ect Mult Living   2 2  2             Review of Systems  Constitutional: Negative for fever.  Genitourinary: Negative for bladder incontinence, dysuria and pelvic pain.  Musculoskeletal: Positive for back pain.  Neurological: Negative for weakness, numbness and paresthesias.  All other systems reviewed and are negative.    Physical Exam  BP 119/86  Pulse 110  Temp(Src) 98 F (36.7 C) (Oral)  Resp 20  Ht 5\' 7"  (1.702 m)  Wt 201 lb (91.173 kg)  BMI 31.48 kg/m2  SpO2 98%  Physical Exam  Constitutional: She appears well-developed and well-nourished.  HENT:    Head: Normocephalic and atraumatic.  Right Ear: External ear normal.  Left Ear: External ear normal.  Nose: Nose normal.  Eyes: Conjunctivae and EOM are normal.  Neck: Neck supple. No tracheal deviation present.  Pulmonary/Chest: Effort normal. No stridor. No respiratory distress.  Musculoskeletal: She exhibits no edema and no tenderness.       Lumbar back: She exhibits decreased range of motion, tenderness, pain and spasm. She exhibits no swelling and no edema.  Neurological: She is alert. She is not disoriented. No cranial nerve deficit or sensory deficit. She exhibits normal muscle tone. Coordination normal.  Reflex Scores:      Patellar reflexes are 2+ on the right side and 2+ on the left side.      Achilles reflexes are 2+ on the right side and 2+ on the left side. Skin: Skin is warm and dry. No rash noted. She is not diaphoretic. No erythema.  Psychiatric: She has a normal mood and affect. Her behavior is normal. Thought content normal.    ED Course  Procedures  MDM No sign of acute neurological or vascular emergency associated with pt's back pain.  May have a component of sciatica.  Safe for outpatient follow up.  Pt has seen Dr Jillyn Hidden in the  past and her PCP.  May want to consider MRI but no emergent need      Celene Kras, MD 05/28/11 4782  Celene Kras, MD 05/28/11 [phone]

## 2011-05-28 NOTE — ED Notes (Signed)
Able to dress self w/o difficulty. Ambulates slowly declining w/c.

## 2011-05-29 ENCOUNTER — Encounter (HOSPITAL_COMMUNITY): Payer: Self-pay

## 2011-07-23 IMAGING — CT CT ABD-PELV W/O CM
3 of 5 series · 6 of 46 positions shown, 11 images · non-contrast
Comparison: 08/18/2010

CLINICAL DATA: Left flank pain.  History of kidney stones

CT ABDOMEN AND PELVIS WITHOUT CONTRAST
TECHNIQUE: Multidetector CT imaging of the abdomen and pelvis was
performed following the standard protocol without intravenous
contrast.

[Series 3: lung 5.0 b60f · axial · 0.66mm/px · z∈[-26,-1]mm · 2 of 17 slices shown, 5 images]
[im 6/17  soft-tissue]
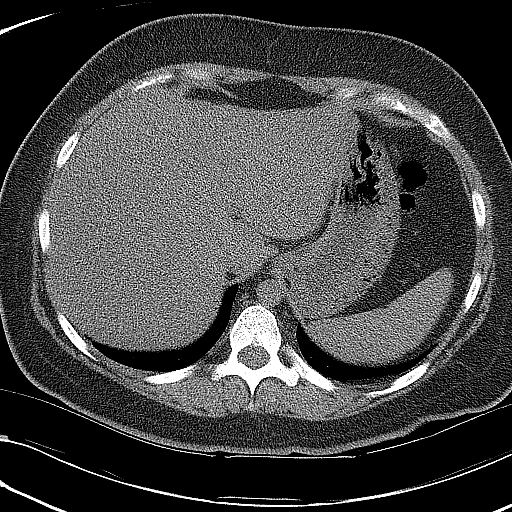
[im 6/17  lung]
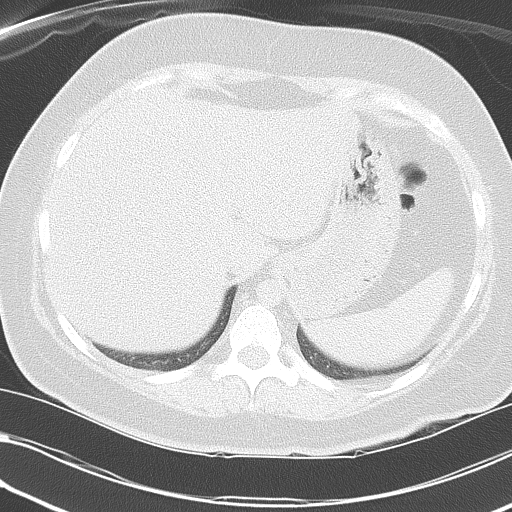
[im 6/17  bone]
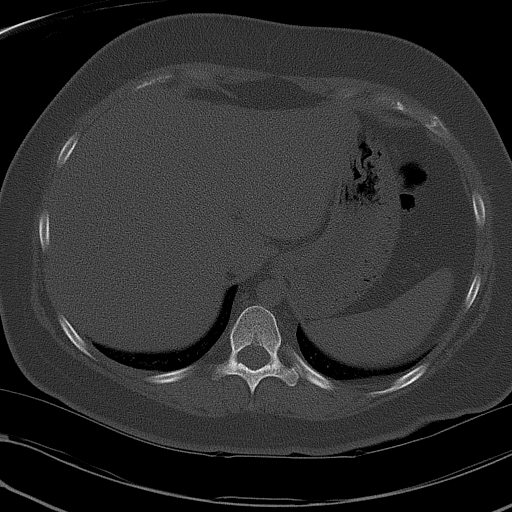
[im 11/17  soft-tissue]
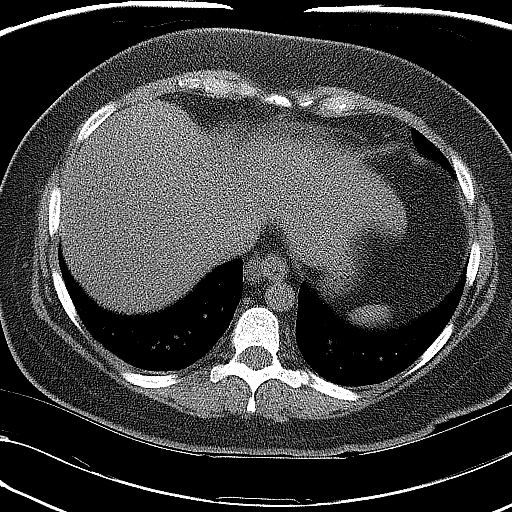
[im 11/17  lung]
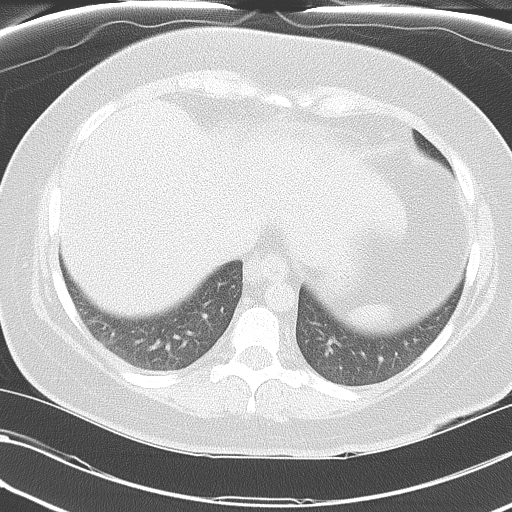

[Series 4: mpr coronal (id) · coronal · 0.68mm/px · 3 of 73 slices shown, 4 images]
[im 25/73  soft-tissue]
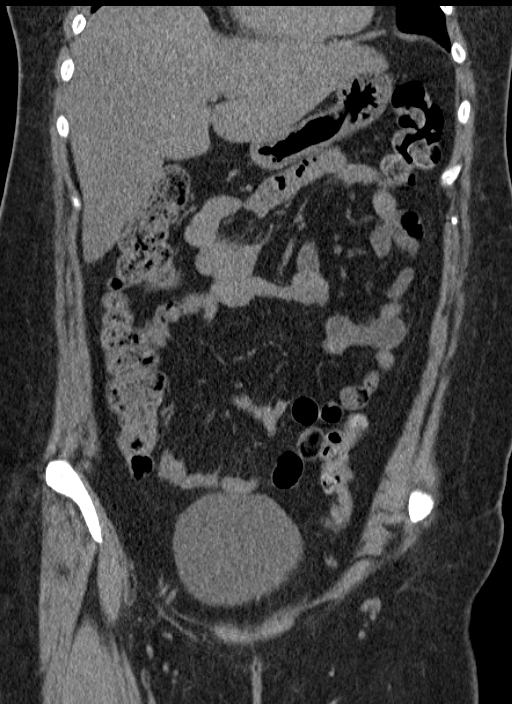
[im 33/73  soft-tissue]
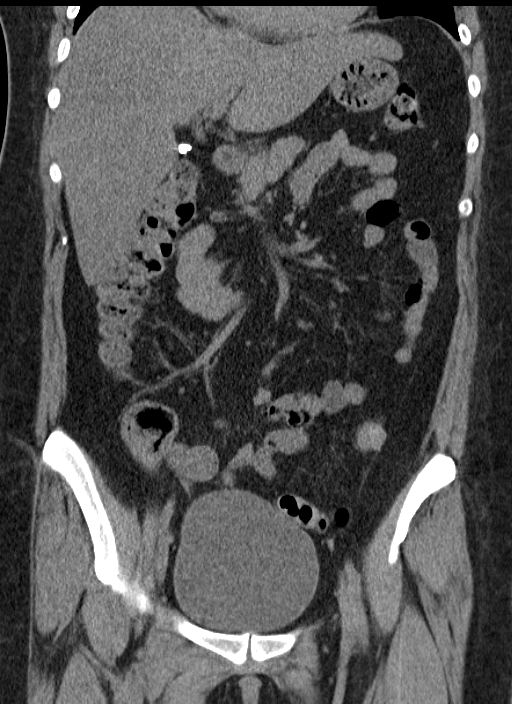
[im 33/73  bone]
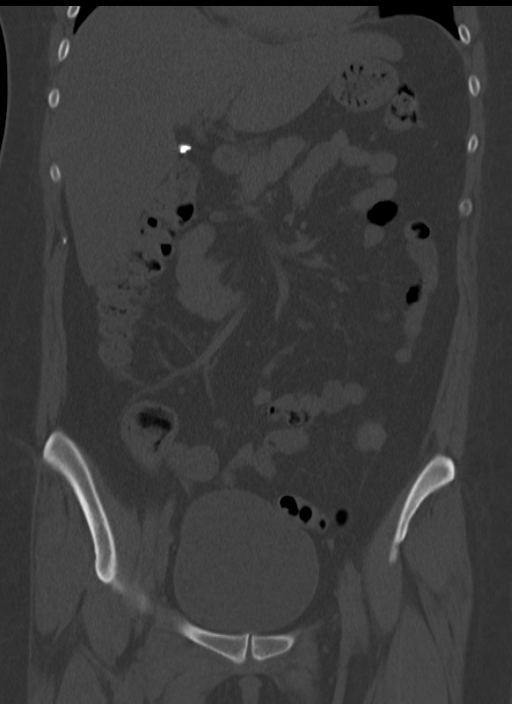
[im 41/73  soft-tissue]
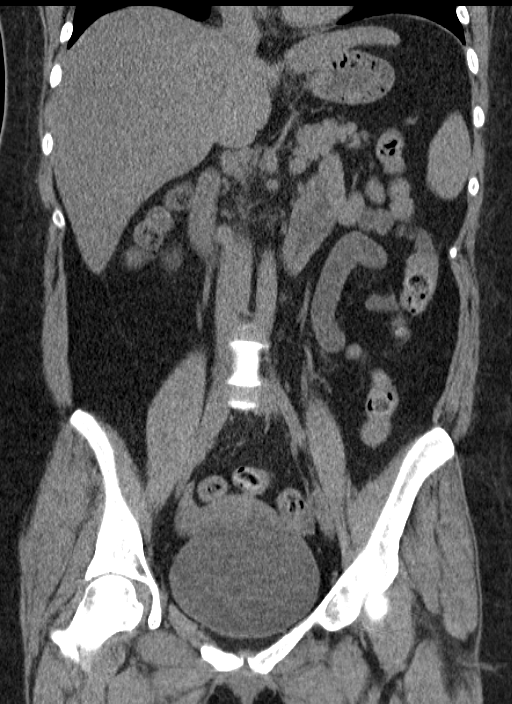

[Series 5: mpr sagittal (id) · sagittal · 0.60mm/px · 1 of 105 slices shown, 2 images]
[im 35/105  soft-tissue]
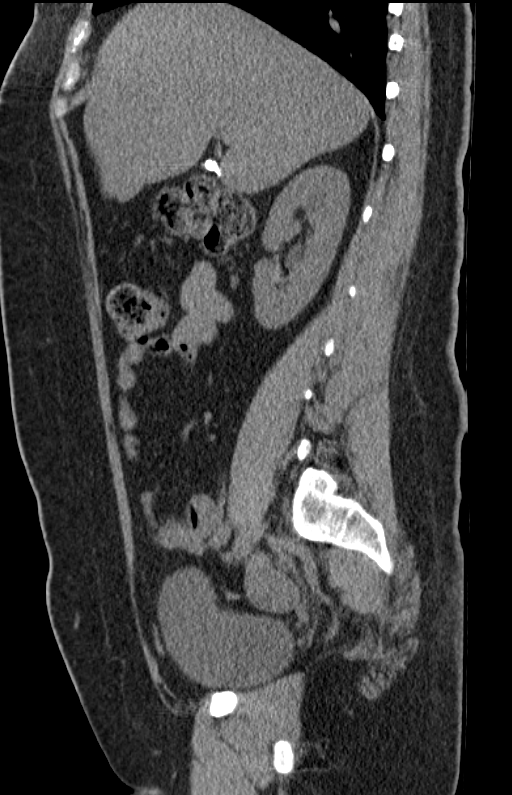
[im 35/105  bone]
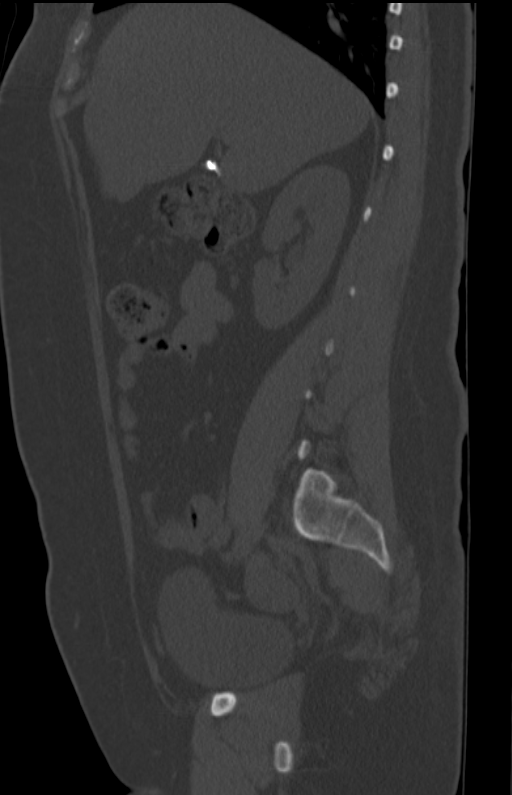

[6 of 46 positions shown; findings below may reference images not displayed]

FINDINGS: The lung bases are clear.  There is a small left renal
calculus as before.  A mildly obstructing stone in the proximal
left ureter has dropped down into the distal ureter.  It measures
approximately 4.4 x  5.5 x 4.8 mm.  There is mild, stable
hydronephrosis and hydroureter.  No calculi are present on the
right.

Prior cholecystectomy.  Abdominal viscera unremarkable in the
unenhanced state. No other acute or significant findings.
IMPRESSION: 1. A 4.4 x 5.5 x 4.8 mm calculus has dropped from the proximal left
ureter to the distal left ureter.  There is a mild degree of
obstruction which has not significantly changed.
2.  No other acute or significant findings.

## 2011-10-08 ENCOUNTER — Encounter (HOSPITAL_COMMUNITY): Payer: Self-pay

## 2011-10-08 DIAGNOSIS — N200 Calculus of kidney: Secondary | ICD-10-CM | POA: Insufficient documentation

## 2011-10-08 DIAGNOSIS — F411 Generalized anxiety disorder: Secondary | ICD-10-CM | POA: Insufficient documentation

## 2011-10-08 DIAGNOSIS — IMO0001 Reserved for inherently not codable concepts without codable children: Secondary | ICD-10-CM | POA: Insufficient documentation

## 2011-10-08 DIAGNOSIS — F17200 Nicotine dependence, unspecified, uncomplicated: Secondary | ICD-10-CM | POA: Insufficient documentation

## 2011-10-08 NOTE — ED Notes (Signed)
Left lower back pain radiates around to left lower abd since yesterday, also nausea and vomiting

## 2011-10-09 ENCOUNTER — Emergency Department (HOSPITAL_COMMUNITY)
Admission: EM | Admit: 2011-10-09 | Discharge: 2011-10-09 | Disposition: A | Discharge status: Home / Self Care | Payer: Medicaid Other | Attending: Emergency Medicine | Admitting: Emergency Medicine

## 2011-10-09 ENCOUNTER — Emergency Department (HOSPITAL_COMMUNITY): Payer: Medicaid Other

## 2011-10-09 DIAGNOSIS — N200 Calculus of kidney: Secondary | ICD-10-CM

## 2011-10-09 LAB — URINE MICROSCOPIC-ADD ON

## 2011-10-09 LAB — URINALYSIS, ROUTINE W REFLEX MICROSCOPIC: Glucose, UA: NEGATIVE mg/dL

## 2011-10-09 MED ORDER — HYDROMORPHONE HCL PF 1 MG/ML IJ SOLN
1.0000 mg | Freq: Once | INTRAMUSCULAR | Status: AC
  Administered 2011-10-09: 1 mg via INTRAVENOUS

## 2011-10-09 MED ORDER — CIPROFLOXACIN HCL 500 MG PO TABS: 500.0000 mg | ORAL_TABLET | Freq: Two times a day (BID) | ORAL | Status: AC

## 2011-10-09 MED ORDER — ONDANSETRON HCL 4 MG/2ML IJ SOLN
4.0000 mg | Freq: Once | INTRAMUSCULAR | Status: AC
  Administered 2011-10-09: 4 mg via INTRAVENOUS
  Filled 2011-10-09: qty 2

## 2011-10-09 MED ORDER — SODIUM CHLORIDE 0.9 % IV SOLN
Freq: Once | INTRAVENOUS | Status: AC
  Administered 2011-10-09: 1000 mL via INTRAVENOUS

## 2011-10-09 MED ORDER — HYDROMORPHONE HCL PF 1 MG/ML IJ SOLN
1.0000 mg | Freq: Once | INTRAMUSCULAR | Status: AC
  Administered 2011-10-09: 1 mg via INTRAVENOUS
  Filled 2011-10-09: qty 1

## 2011-10-09 MED ORDER — HYDROMORPHONE HCL PF 1 MG/ML IJ SOLN
INTRAMUSCULAR | Status: AC
  Filled 2011-10-09: qty 1

## 2011-10-09 MED ORDER — HYDROCODONE-ACETAMINOPHEN 5-325 MG PO TABS: 1.0000 | ORAL_TABLET | ORAL | Status: DC | PRN

## 2011-10-09 MED ORDER — CIPROFLOXACIN IN D5W 400 MG/200ML IV SOLN
400.0000 mg | Freq: Once | INTRAVENOUS | Status: AC
  Administered 2011-10-09: 400 mg via INTRAVENOUS
  Filled 2011-10-09: qty 200

## 2011-10-09 NOTE — ED Provider Notes (Addendum)
History     CSN: [id number] Arrival date & time: 10/09/2011 12:19 AM   First MD Initiated Contact with Patient 10/09/11 0134      Chief Complaint  Patient presents with  . Back Pain    (Consider location/radiation/quality/duration/timing/severity/associated sxs/prior treatment) HPI Comments: Isabella Buchanan is a 30 y.o. female who presents to the Emergency Department complaining of left flank and back pain with radiation to the left abdomen that began yesterday. Associated with nausea and vomiting. Denies fever, chills. H/o kidney stones, all of which have required intervention. Has not taken any medicines for the pain and nothing improves the pain.Patient of [facility].  Patient is a 30 y.o. female presenting with back pain. The history is provided by the patient.  Back Pain  This is a new problem. The current episode started yesterday. The problem has been gradually worsening. The pain is associated with no known injury. Pain location: left flank and lower back. The quality of the pain is described as stabbing and aching. Radiates to: left abdomen. The pain is at a severity of 6/10. The pain is moderate. Exacerbated by: nothing. Associated symptoms include abdominal pain. Pertinent negatives include no chest pain, no fever and no headaches. Associated symptoms comments: Nausea, vomiting. She has tried nothing for the symptoms. The treatment provided no relief.    Past Medical History  Diagnosis Date  . Anxiety   . Fibromyalgia   . Kidney stones   . Restless leg   . Chronic back pain     Past Surgical History  Procedure Date  . Cholecystectomy   . Tonsillectomy   . Tubal ligation     Family History  Problem Relation Age of Onset  . Hypertension Father   . Diabetes Father   . Cancer Other   . Diabetes Other     History  Substance Use Topics  . Smoking status: Current Everyday Smoker -- 1.0 packs/day  . Smokeless tobacco: Not on file  . Alcohol Use: No    OB  History    Grav Para Term Preterm Abortions TAB SAB Ect Mult Living   2 2  2             Review of Systems  Constitutional: Negative for fever.  Cardiovascular: Negative for chest pain.  Gastrointestinal: Positive for abdominal pain.  Musculoskeletal: Positive for back pain.  Neurological: Negative for headaches.  10 Systems reviewed and are negative for acute change except as noted in the HPI.  Allergies  Amoxicillin; Codeine; Omnicef; and Penicillins  Home Medications   Current Outpatient Rx  Name Route Sig Dispense Refill  . CITALOPRAM HYDROBROMIDE 40 MG PO TABS Oral Take 40 mg by mouth daily.      Marland Kitchen CLONAZEPAM 0.5 MG PO TABS Oral Take 0.5 mg by mouth 2 (two) times daily as needed.      Marland Kitchen HYDROCODONE-ACETAMINOPHEN 7.5-650 MG PO TABS Oral Take 1 tablet by mouth every 6 (six) hours as needed.      Marland Kitchen PROMETHAZINE HCL 25 MG PO TABS Oral Take 25 mg by mouth every 6 (six) hours as needed.      Marland Kitchen PRAMIPEXOLE DIHYDROCHLORIDE 0.125 MG PO TABS Oral Take 0.125 mg by mouth 3 (three) times daily.        BP 141/96  Pulse 87  Resp 16  Ht 5\' 7"  (1.702 m)  Wt 214 lb (97.07 kg)  BMI 33.52 kg/m2  SpO2 98%  LMP 08/28/2011  Physical Exam  Nursing note and vitals  reviewed. Constitutional: She is oriented to person, place, and time. She appears well-developed and well-nourished. She appears distressed.  HENT:  Right Ear: External ear normal.  Left Ear: External ear normal.  Mouth/Throat: Oropharynx is clear and moist.  Eyes: Conjunctivae and EOM are normal. Pupils are equal, round, and reactive to light.  Neck: Normal range of motion. Neck supple.  Cardiovascular: Normal rate, normal heart sounds and intact distal pulses.   Pulmonary/Chest: Effort normal and breath sounds normal.  Abdominal: Soft. Bowel sounds are normal. There is tenderness. There is no rebound and no guarding.       LLQ tenderness, mild to palpation  Genitourinary:       Left CVA tenderness to percussion.    Musculoskeletal: Normal range of motion.  Neurological: She is alert and oriented to person, place, and time. She has normal reflexes.  Skin: Skin is warm and dry.    ED Course  Procedures (including critical care time)  Results for orders placed during the hospital encounter of 10/09/11  URINALYSIS, ROUTINE W REFLEX MICROSCOPIC      Component Value Range   Color, Urine YELLOW  YELLOW    Appearance HAZY (*) CLEAR    Specific Gravity, Urine >1.030 (*) 1.005 - 1.030    pH 5.5  5.0 - 8.0    Glucose, UA NEGATIVE  NEGATIVE (mg/dL)   Hgb urine dipstick MODERATE (*) NEGATIVE    Bilirubin Urine SMALL (*) NEGATIVE    Ketones, ur TRACE (*) NEGATIVE (mg/dL)   Protein, ur TRACE (*) NEGATIVE (mg/dL)   Urobilinogen, UA 0.2  0.0 - 1.0 (mg/dL)   Nitrite NEGATIVE  NEGATIVE    Leukocytes, UA TRACE (*) NEGATIVE   PREGNANCY, URINE      Component Value Range   Preg Test, Ur NEGATIVE    URINE MICROSCOPIC-ADD ON      Component Value Range   Squamous Epithelial / LPF MANY (*) RARE    WBC, UA 7-10  <3 (WBC/hpf)   RBC / HPF 7-10  <3 (RBC/hpf)   Bacteria, UA MANY (*) RARE    Crystals CA OXALATE CRYSTALS (*) NEGATIVE    Urine-Other CLUE CELLS PRESENT     Ct Abdomen Pelvis Wo Contrast  10/09/2011  *RADIOLOGY REPORT*  Clinical Data: Left-sided flank pain.  CT ABDOMEN AND PELVIS WITHOUT CONTRAST  Technique:  Multidetector CT imaging of the abdomen and pelvis was performed following the standard protocol without intravenous contrast.  Comparison: 10/11/2010  Findings: Limited images through the lung bases demonstrate no significant appreciable abnormality. The heart size is within normal limits. No pleural or pericardial effusion.  Intra-abdominal organ evaluation is limited without intravenous contrast.  Within this limitation, unremarkable liver, spleen, pancreas, adrenal glands.  Status post cholecystectomy.  No biliary ductal dilatation.  Unremarkable right kidney.  There are nonobstructing left renal  stones and mild hydroureteronephrosis to the level of a 4 mm stone within the mid left ureter.  No bowel obstruction.  No CT evidence for colitis.  Normal appendix.  No free intraperitoneal air or fluid.  No lymphadenopathy.  Normal caliber vasculature.  Thin-walled bladder.  Unremarkable uterus and adnexa.  No aggressive osseous abnormality.  IMPRESSION: 4 mm stone within the mid left ureter, results in mild proximal hydroureteronephrosis.  There are additional nonobstructing left renal stones.  Original Report Authenticated By: Waneta Martins, M.D.    No diagnosis found.    MDM  Patient with left flank pain, hematuria, CT with 4 mm stone in left mid  ureter. Given IVF, analgesics, antibiotics. Patient to follow up with [facility].Pt feels improved after observation and/or treatment in ED.Pt stable in ED with no significant deterioration in condition.The patient appears reasonably screened and/or stabilized for discharge and I doubt any other medical condition or other [facility] requiring further screening, evaluation, or treatment in the ED at this time prior to discharge.  CRITICAL CARE Performed by: Annamarie Dawley.   Total critical care time: 40 Critical care time was exclusive of separately billable procedures and treating other patients.  Critical care was necessary to treat or prevent imminent or life-threatening deterioration.  Critical care was time spent personally by me on the following activities: development of treatment plan with patient and/or surrogate as well as nursing, discussions with consultants, evaluation of patient's response to treatment, examination of patient, obtaining history from patient or surrogate, ordering and performing treatments and interventions, ordering and review of laboratory studies, ordering and review of radiographic studies, pulse oximetry and re-evaluation of patient's condition.  Nicoletta Dress. Colon Branch, MD 10/09/11 0501  Nicoletta Dress. Colon Branch,  MD 12/06/11 [phone]

## 2011-10-10 ENCOUNTER — Other Ambulatory Visit: Payer: Self-pay | Admitting: Urology

## 2011-10-10 ENCOUNTER — Ambulatory Visit (HOSPITAL_COMMUNITY)
Admission: RE | Admit: 2011-10-10 | Discharge: 2011-10-10 | Disposition: A | Discharge status: Home / Self Care | Payer: Medicaid Other | Source: Ambulatory Visit | Attending: Urology | Admitting: Urology

## 2011-10-10 ENCOUNTER — Ambulatory Visit (INDEPENDENT_AMBULATORY_CARE_PROVIDER_SITE_OTHER): Payer: Medicaid Other | Admitting: Urology

## 2011-10-10 DIAGNOSIS — Z87442 Personal history of urinary calculi: Secondary | ICD-10-CM | POA: Insufficient documentation

## 2011-10-10 DIAGNOSIS — N201 Calculus of ureter: Secondary | ICD-10-CM

## 2011-10-10 DIAGNOSIS — N3010 Interstitial cystitis (chronic) without hematuria: Secondary | ICD-10-CM

## 2011-10-10 DIAGNOSIS — R9389 Abnormal findings on diagnostic imaging of other specified body structures: Secondary | ICD-10-CM | POA: Insufficient documentation

## 2011-10-10 DIAGNOSIS — R1032 Left lower quadrant pain: Secondary | ICD-10-CM | POA: Insufficient documentation

## 2011-10-11 ENCOUNTER — Encounter (HOSPITAL_COMMUNITY): Payer: Self-pay | Admitting: *Deleted

## 2011-10-11 ENCOUNTER — Other Ambulatory Visit: Payer: Self-pay | Admitting: Urology

## 2011-10-11 NOTE — Progress Notes (Signed)
Pt instructed to bring blue folder take laxative HS before procedure and no aspirin 3 days prior to procedure

## 2011-10-13 ENCOUNTER — Emergency Department (HOSPITAL_COMMUNITY)
Admission: EM | Admit: 2011-10-13 | Discharge: 2011-10-13 | Disposition: A | Discharge status: Home / Self Care | Payer: Medicaid Other | Attending: Emergency Medicine | Admitting: Emergency Medicine

## 2011-10-13 ENCOUNTER — Encounter (HOSPITAL_COMMUNITY): Payer: Self-pay | Admitting: Emergency Medicine

## 2011-10-13 ENCOUNTER — Encounter (HOSPITAL_COMMUNITY): Payer: Self-pay

## 2011-10-13 DIAGNOSIS — N23 Unspecified renal colic: Secondary | ICD-10-CM

## 2011-10-13 DIAGNOSIS — N201 Calculus of ureter: Secondary | ICD-10-CM | POA: Insufficient documentation

## 2011-10-13 DIAGNOSIS — R112 Nausea with vomiting, unspecified: Secondary | ICD-10-CM | POA: Insufficient documentation

## 2011-10-13 DIAGNOSIS — R1032 Left lower quadrant pain: Secondary | ICD-10-CM | POA: Insufficient documentation

## 2011-10-13 LAB — URINALYSIS, ROUTINE W REFLEX MICROSCOPIC
Ketones, ur: NEGATIVE mg/dL
Nitrite: NEGATIVE
Protein, ur: NEGATIVE mg/dL
Urobilinogen, UA: 0.2 mg/dL (ref 0.0–1.0)

## 2011-10-13 LAB — URINE MICROSCOPIC-ADD ON

## 2011-10-13 MED ORDER — MORPHINE SULFATE 4 MG/ML IJ SOLN
8.0000 mg | Freq: Once | INTRAMUSCULAR | Status: AC
  Administered 2011-10-13: 8 mg via INTRAVENOUS
  Filled 2011-10-13: qty 2

## 2011-10-13 MED ORDER — SODIUM CHLORIDE 0.9 % IV SOLN
Freq: Once | INTRAVENOUS | Status: AC
  Administered 2011-10-13: 02:00:00 via INTRAVENOUS

## 2011-10-13 MED ORDER — KETOROLAC TROMETHAMINE 30 MG/ML IJ SOLN
30.0000 mg | Freq: Once | INTRAMUSCULAR | Status: DC
  Filled 2011-10-13: qty 1

## 2011-10-13 MED ORDER — OXYCODONE-ACETAMINOPHEN 10-325 MG PO TABS: 1.0000 | ORAL_TABLET | ORAL | Status: AC | PRN

## 2011-10-13 MED ORDER — ONDANSETRON HCL 4 MG/2ML IJ SOLN
4.0000 mg | Freq: Once | INTRAMUSCULAR | Status: AC
  Administered 2011-10-13: 4 mg via INTRAVENOUS
  Filled 2011-10-13: qty 2

## 2011-10-13 MED ORDER — HYDROMORPHONE HCL PF 1 MG/ML IJ SOLN
1.0000 mg | INTRAMUSCULAR | Status: AC
  Administered 2011-10-13: 1 mg via INTRAVENOUS
  Filled 2011-10-13: qty 1

## 2011-10-13 NOTE — ED Notes (Signed)
States was seen here on Sunday and diagnosed with kidney stones; states was given Oxycodone and it's not helping.  States having lithotripsy on Monday.

## 2011-10-13 NOTE — ED Notes (Signed)
Patient c/o nausea when she received Morphine.  Dr. Hyacinth Meeker aware and order received for Zofran.

## 2011-10-13 NOTE — ED Provider Notes (Signed)
History     CSN: [id number] Arrival date & time: 10/13/2011  1:33 AM   First MD Initiated Contact with Patient 10/13/11 0155      Chief Complaint  Patient presents with  . Flank Pain    (Consider location/radiation/quality/duration/timing/severity/associated sxs/prior treatment) HPI Comments: 30 year old female  Left flank and abdominal pain, acute in onset several days ago Pain is intermittent Pain is associated with nausea and vomiting Pain is associated with hematuria Symptoms improved with Percocet intermittently Patient is scheduled to see the urologist on Monday for a lithotripsy. Currently symptoms are offered  She was seen and evaluated in the emergency department several days ago and diagnosed with a 4 mm mid ureteral stone. There was some hydronephrosis at that time there was also evidence of a urinary infection which was treated with ciprofloxacin. Since that time she has been seen by her urologist and has been given Flomax, Percocet and scheduled for outpatient therapy for lithotripsy. Today the pain got worse prompting her visit to the emergency department. It still continues to come in a waxing and waning pattern   Patient is a 30 y.o. female presenting with flank pain. The history is provided by the patient, medical records and a friend.  Flank Pain    Past Medical History  Diagnosis Date  . Anxiety   . Fibromyalgia   . Restless leg   . Chronic back pain   . Kidney stones     interstitual cystitis  . GERD (gastroesophageal reflux disease)   . Headache     mygrains    Past Surgical History  Procedure Date  . Cholecystectomy   . Tonsillectomy   . Tubal ligation     Family History  Problem Relation Age of Onset  . Hypertension Father   . Diabetes Father   . Cancer Other   . Diabetes Other     History  Substance Use Topics  . Smoking status: Current Everyday Smoker -- 1.0 packs/day  . Smokeless tobacco: Not on file  . Alcohol Use: No     OB History    Grav Para Term Preterm Abortions TAB SAB Ect Mult Living   2 2  2             Review of Systems  Genitourinary: Positive for flank pain.  All other systems reviewed and are negative.    Allergies  Amoxicillin; Codeine; Omnicef; Penicillins; and Sulfa antibiotics  Home Medications   Current Outpatient Rx  Name Route Sig Dispense Refill  . CIPROFLOXACIN HCL 500 MG PO TABS Oral Take 1 tablet (500 mg total) by mouth 2 (two) times daily. 14 tablet 0  . CITALOPRAM HYDROBROMIDE 40 MG PO TABS Oral Take 40 mg by mouth daily.      Marland Kitchen CLONAZEPAM 0.5 MG PO TABS Oral Take 0.5 mg by mouth 2 (two) times daily as needed.      Marland Kitchen HYDROCODONE-ACETAMINOPHEN 7.5-650 MG PO TABS Oral Take 1 tablet by mouth every 6 (six) hours as needed.      Marland Kitchen HYDROCODONE-ACETAMINOPHEN 5-325 MG PO TABS Oral Take 1 tablet by mouth every 4 (four) hours as needed for pain. 15 tablet 0  . OXYCODONE-ACETAMINOPHEN 10-325 MG PO TABS Oral Take 1 tablet by mouth every 4 (four) hours as needed for pain. 20 tablet 0  . PRAMIPEXOLE DIHYDROCHLORIDE 0.125 MG PO TABS Oral Take 0.125 mg by mouth 3 (three) times daily.      Marland Kitchen PROMETHAZINE HCL 25 MG PO TABS Oral Take 25 mg  by mouth every 6 (six) hours as needed.        BP 137/100  Pulse 91  Temp(Src) 98.5 F (36.9 C) (Oral)  Resp 20  Ht 5\' 7"  (1.702 m)  Wt 214 lb (97.07 kg)  BMI 33.52 kg/m2  SpO2 98%  LMP 10/12/2011  Physical Exam  Nursing note and vitals reviewed. Constitutional: She appears well-developed and well-nourished.       Uncomfortable appearing  HENT:  Head: Normocephalic and atraumatic.  Mouth/Throat: Oropharynx is clear and moist. No oropharyngeal exudate.  Eyes: Conjunctivae and EOM are normal. Pupils are equal, round, and reactive to light. Right eye exhibits no discharge. Left eye exhibits no discharge. No scleral icterus.  Neck: Normal range of motion. Neck supple. No JVD present. No thyromegaly present.  Cardiovascular: Normal rate,  regular rhythm, normal heart sounds and intact distal pulses.  Exam reveals no gallop and no friction rub.   No murmur heard. Pulmonary/Chest: Effort normal and breath sounds normal. No respiratory distress. She has no wheezes. She has no rales.  Abdominal: Soft. Bowel sounds are normal. She exhibits no distension and no mass. There is no tenderness.       Left Sided CVA  Musculoskeletal: Normal range of motion. She exhibits no edema and no tenderness.  Lymphadenopathy:    She has no cervical adenopathy.  Neurological: She is alert. Coordination normal.  Skin: Skin is warm and dry. No rash noted. No erythema.  Psychiatric: She has a normal mood and affect. Her behavior is normal.    ED Course  Procedures (including critical care time)  Labs Reviewed  URINALYSIS, ROUTINE W REFLEX MICROSCOPIC - Abnormal; Notable for the following:    Color, Urine AMBER (*) BIOCHEMICALS MAY BE AFFECTED BY COLOR   Appearance CLOUDY (*)    Hgb urine dipstick LARGE (*)    Leukocytes, UA SMALL (*)    All other components within normal limits  URINE MICROSCOPIC-ADD ON - Abnormal; Notable for the following:    Squamous Epithelial / LPF MANY (*)    Bacteria, UA MANY (*)    All other components within normal limits  URINE CULTURE   No results found.   1. Ureteral colic       MDM  Known kidney stone on the left, ureteral colic consistent with patient's history and exam. We'll recheck urinalysis to rule out worsening infection, vital signs appear stable with no signs of fever or tachycardia. Toradol to be held given the patient's attending procedure on Monday, opiate medication ordered.   Labs reviewed with patient, urine culture sent, significant improvement after pain medications in the emergency department, Toradol held, no fever no vomiting and no tachycardia on discharge.     Vida Roller, MD 10/13/11 [phone]

## 2011-10-16 ENCOUNTER — Ambulatory Visit (HOSPITAL_COMMUNITY): Payer: Medicaid Other

## 2011-10-16 ENCOUNTER — Ambulatory Visit (HOSPITAL_COMMUNITY)
Admission: RE | Admit: 2011-10-16 | Discharge: 2011-10-16 | Disposition: A | Discharge status: Home / Self Care | Payer: Medicaid Other | Source: Ambulatory Visit | Attending: Urology | Admitting: Urology

## 2011-10-16 ENCOUNTER — Encounter (HOSPITAL_COMMUNITY)
Admission: RE | Disposition: A | Discharge status: Home / Self Care | Payer: Self-pay | Source: Ambulatory Visit | Attending: Urology

## 2011-10-16 ENCOUNTER — Encounter (HOSPITAL_COMMUNITY): Payer: Self-pay | Admitting: *Deleted

## 2011-10-16 DIAGNOSIS — N201 Calculus of ureter: Secondary | ICD-10-CM | POA: Insufficient documentation

## 2011-10-16 DIAGNOSIS — Z538 Procedure and treatment not carried out for other reasons: Secondary | ICD-10-CM | POA: Insufficient documentation

## 2011-10-16 DIAGNOSIS — F17200 Nicotine dependence, unspecified, uncomplicated: Secondary | ICD-10-CM | POA: Insufficient documentation

## 2011-10-16 HISTORY — DX: Other specified postprocedural states: Z98.890

## 2011-10-16 HISTORY — DX: Headache: R51

## 2011-10-16 HISTORY — DX: Gastro-esophageal reflux disease without esophagitis: K21.9

## 2011-10-16 HISTORY — DX: Nausea with vomiting, unspecified: R11.2

## 2011-10-16 SURGERY — LITHOTRIPSY, ESWL
Anesthesia: LOCAL | Laterality: Left

## 2011-10-16 MED ORDER — DIAZEPAM 5 MG PO TABS
10.0000 mg | ORAL_TABLET | ORAL | Status: AC
  Administered 2011-10-16: 10 mg via ORAL

## 2011-10-16 MED ORDER — DIPHENHYDRAMINE HCL 25 MG PO CAPS
ORAL_CAPSULE | ORAL | Status: AC
  Administered 2011-10-16: 25 mg via ORAL
  Filled 2011-10-16: qty 1

## 2011-10-16 MED ORDER — DIPHENHYDRAMINE HCL 25 MG PO CAPS
25.0000 mg | ORAL_CAPSULE | ORAL | Status: AC
  Administered 2011-10-16: 25 mg via ORAL

## 2011-10-16 MED ORDER — DEXTROSE-NACL 5-0.45 % IV SOLN
INTRAVENOUS | Status: DC
  Administered 2011-10-16: 1000 mL via INTRAVENOUS

## 2011-10-16 MED ORDER — DIAZEPAM 5 MG PO TABS
ORAL_TABLET | ORAL | Status: AC
  Administered 2011-10-16: 10 mg via ORAL
  Filled 2011-10-16: qty 2

## 2011-10-17 ENCOUNTER — Encounter (HOSPITAL_COMMUNITY): Payer: Self-pay

## 2011-11-28 ENCOUNTER — Ambulatory Visit: Payer: Medicaid Other | Admitting: Urology

## 2012-01-28 ENCOUNTER — Encounter (HOSPITAL_COMMUNITY): Payer: Self-pay | Admitting: Emergency Medicine

## 2012-01-28 ENCOUNTER — Emergency Department (HOSPITAL_COMMUNITY)
Admission: EM | Admit: 2012-01-28 | Discharge: 2012-01-28 | Disposition: A | Discharge status: Home / Self Care | Payer: Medicaid Other | Attending: Emergency Medicine | Admitting: Emergency Medicine

## 2012-01-28 ENCOUNTER — Emergency Department (HOSPITAL_COMMUNITY): Payer: Medicaid Other

## 2012-01-28 DIAGNOSIS — R300 Dysuria: Secondary | ICD-10-CM | POA: Insufficient documentation

## 2012-01-28 DIAGNOSIS — R109 Unspecified abdominal pain: Secondary | ICD-10-CM | POA: Insufficient documentation

## 2012-01-28 DIAGNOSIS — K219 Gastro-esophageal reflux disease without esophagitis: Secondary | ICD-10-CM | POA: Insufficient documentation

## 2012-01-28 DIAGNOSIS — M549 Dorsalgia, unspecified: Secondary | ICD-10-CM | POA: Insufficient documentation

## 2012-01-28 DIAGNOSIS — G8929 Other chronic pain: Secondary | ICD-10-CM | POA: Insufficient documentation

## 2012-01-28 DIAGNOSIS — R112 Nausea with vomiting, unspecified: Secondary | ICD-10-CM | POA: Insufficient documentation

## 2012-01-28 DIAGNOSIS — E860 Dehydration: Secondary | ICD-10-CM

## 2012-01-28 DIAGNOSIS — Z79899 Other long term (current) drug therapy: Secondary | ICD-10-CM | POA: Insufficient documentation

## 2012-01-28 LAB — URINALYSIS, ROUTINE W REFLEX MICROSCOPIC
Ketones, ur: NEGATIVE mg/dL
Leukocytes, UA: NEGATIVE
Nitrite: NEGATIVE
Urobilinogen, UA: 0.2 mg/dL (ref 0.0–1.0)
pH: 5.5 (ref 5.0–8.0)

## 2012-01-28 LAB — URINE MICROSCOPIC-ADD ON

## 2012-01-28 LAB — RAPID URINE DRUG SCREEN, HOSP PERFORMED
Barbiturates: NOT DETECTED
Cocaine: NOT DETECTED
Tetrahydrocannabinol: NOT DETECTED

## 2012-01-28 MED ORDER — FENTANYL CITRATE 0.05 MG/ML IJ SOLN
50.0000 ug | Freq: Once | INTRAMUSCULAR | Status: AC
  Administered 2012-01-28: 50 ug via INTRAVENOUS
  Filled 2012-01-28: qty 2

## 2012-01-28 MED ORDER — PROMETHAZINE HCL 25 MG RE SUPP: 25.0000 mg | Freq: Four times a day (QID) | RECTAL | Status: DC | PRN

## 2012-01-28 MED ORDER — KETOROLAC TROMETHAMINE 30 MG/ML IJ SOLN
30.0000 mg | Freq: Once | INTRAMUSCULAR | Status: AC
  Administered 2012-01-28: 30 mg via INTRAVENOUS
  Filled 2012-01-28: qty 1

## 2012-01-28 MED ORDER — SODIUM CHLORIDE 0.9 % IV BOLUS (SEPSIS)
1000.0000 mL | Freq: Once | INTRAVENOUS | Status: AC
  Administered 2012-01-28: 1000 mL via INTRAVENOUS

## 2012-01-28 MED ORDER — TAMSULOSIN HCL 0.4 MG PO CAPS: ORAL_CAPSULE | ORAL | Status: DC

## 2012-01-28 MED ORDER — DIPHENHYDRAMINE HCL 50 MG/ML IJ SOLN
50.0000 mg | Freq: Once | INTRAMUSCULAR | Status: AC
  Administered 2012-01-28: 50 mg via INTRAVENOUS
  Filled 2012-01-28: qty 1

## 2012-01-28 MED ORDER — PERCOCET 5-325 MG PO TABS: 1.0000 | ORAL_TABLET | Freq: Four times a day (QID) | ORAL | Status: AC | PRN

## 2012-01-28 MED ORDER — METOCLOPRAMIDE HCL 5 MG/ML IJ SOLN
10.0000 mg | Freq: Once | INTRAMUSCULAR | Status: AC
  Administered 2012-01-28: 10 mg via INTRAVENOUS
  Filled 2012-01-28: qty 2

## 2012-01-28 NOTE — ED Notes (Signed)
Patient c/o left flank pain x 2 days. Patient reports nausea, vomiting, painful/bloody urination. Reports hx of kidney stones.

## 2012-01-28 NOTE — ED Notes (Signed)
Patient requesting something for nausea. EDP made aware.  

## 2012-01-28 NOTE — Discharge Instructions (Signed)
Drink plenty of fluids. Take the percocet for severe pain, then your hydrocodone for milder pain. Let Dr Hillis Range know you are having pain again. Return to the ED if you get a fever or have uncontrolled vomiting or pain.

## 2012-01-28 NOTE — ED Provider Notes (Cosign Needed)
History  This chart was scribed for Ward Givens, MD by Bennett Scrape. This patient was seen in room APA08/[facility]08 and the patient's care was started at 11:08AM.  CSN: [id number]  Arrival date & time 01/28/12  1010   First MD Initiated Contact with Patient 01/28/12 1057      Chief Complaint  Patient presents with  . Flank Pain    Patient is a 31 y.o. female presenting with flank pain. The history is provided by the patient. No language interpreter was used.  Flank Pain This is a new problem. The current episode started 2 days ago. The problem occurs constantly. The problem has been gradually worsening. The symptoms are aggravated by nothing. The symptoms are relieved by nothing. She has tried nothing for the symptoms.    Isabella Buchanan is a 31 y.o. female with a h/o kidney stones who presents to the Emergency Department complaining of 2 days of gradual onset, gradually worsening, non-radiating left flank pain that she describes as sharp. She states that the pain was intermittent for the first 24 hours and then became constant last night. She lists nausea, emesis and dysuria as associated symptoms. The pain is worse with movement. She reports taking hydrocodone with no improvement in her symptoms. She denies frequency and fever as associated symptoms. She also has a h/o chronic back pain, fibromyalgia and GERD.  Pt had a lithotripsy done in November by Dr Lynden Ang and her CT at that time noted several small nonobstructing renal stone.    Dr. Lynden Ang for Urology.  Dr. Elvera Lennox is PCP.  Past Medical History  Diagnosis Date  . Anxiety   . Fibromyalgia   . Restless leg   . Chronic back pain   . Kidney stones     interstitual cystitis  . GERD (gastroesophageal reflux disease)   . Headache     mygrains  . PONV (postoperative nausea and vomiting)   . Nephrolithiasis     Past Surgical History  Procedure Date  . Cholecystectomy   . Tonsillectomy   . Tubal ligation      Family History  Problem Relation Age of Onset  . Hypertension Father   . Diabetes Father   . Cancer Other   . Diabetes Other     History  Substance Use Topics  . Smoking status: Current Everyday Smoker -- 1.0 packs/day for 15 years    Types: Cigarettes  . Smokeless tobacco: Never Used  . Alcohol Use: No  Pt lives with boyfriend and daughters.  unemployed  OB History    Grav Para Term Preterm Abortions TAB SAB Ect Mult Living   2 2  2      2       Review of Systems  Genitourinary: Positive for flank pain.    A complete 10 system review of systems was obtained and is otherwise negative except as noted in the HPI.   Allergies  Codeine; Omni-pac; Sulfa antibiotics; Amoxicillin; and Penicillins  Home Medications   Current Outpatient Rx  Name Route Sig Dispense Refill  . CITALOPRAM HYDROBROMIDE 40 MG PO TABS Oral Take 40 mg by mouth every evening.     Marland Kitchen CLONAZEPAM 0.5 MG PO TABS Oral Take 0.5 mg by mouth 2 (two) times daily as needed. For anxiety    . GABAPENTIN 300 MG PO CAPS Oral Take 900 mg by mouth at bedtime.    Marland Kitchen HYDROCODONE-ACETAMINOPHEN 7.5-650 MG PO TABS Oral Take 1 tablet by mouth every 6 (six) hours  as needed. For fibromyalgia pain    . IBUPROFEN 200 MG PO TABS Oral Take 800 mg by mouth every 6 (six) hours as needed. For pain    . PROMETHAZINE HCL 25 MG PO TABS Oral Take 25-50 mg by mouth every 6 (six) hours as needed. For nausea    . SUMATRIPTAN SUCCINATE 100 MG PO TABS Oral Take 100 mg by mouth every 2 (two) hours as needed. For migraine pain      Triage Vitals: BP 146/100  Pulse 103  Temp(Src) 98.8 F (37.1 C) (Oral)  Resp 17  Ht 5\' 7"  (1.702 m)  Wt 210 lb (95.255 kg)  BMI 32.89 kg/m2  SpO2 99%  LMP 01/28/2012  Vital signs normal except for tachycardia   Physical Exam  Nursing note and vitals reviewed. Constitutional: She is oriented to person, place, and time. She appears well-developed and well-nourished.       Appears uncomfortable  HENT:   Head: Normocephalic and atraumatic.  Right Ear: External ear normal.  Left Ear: External ear normal.  Mouth/Throat: Oropharynx is clear and moist.  Eyes: Conjunctivae and EOM are normal.  Neck: Normal range of motion. Neck supple.  Cardiovascular: Normal rate, regular rhythm, normal heart sounds and intact distal pulses.  Exam reveals no gallop and no friction rub.   No murmur heard. Pulmonary/Chest: Effort normal and breath sounds normal. No respiratory distress. She has no wheezes. She has no rales. She exhibits no tenderness.  Abdominal: Soft. Bowel sounds are normal. She exhibits no distension. There is no tenderness. There is no rebound and no guarding.  Musculoskeletal: Normal range of motion. She exhibits tenderness (Left flank). She exhibits no edema.  Neurological: She is alert and oriented to person, place, and time. No cranial nerve deficit.  Skin: Skin is warm and dry.  Psychiatric: She has a normal mood and affect. Her behavior is normal.    ED Course  Procedures (including critical care time)   Medications  ketorolac (TORADOL) 30 MG/ML injection 30 mg (30 mg Intravenous Given 01/28/12 1125)  diphenhydrAMINE (BENADRYL) injection 50 mg (50 mg Intravenous Given 01/28/12 1125)  fentaNYL (SUBLIMAZE) injection 50 mcg (50 mcg Intravenous Given 01/28/12 1126)  metoCLOPramide (REGLAN) injection 10 mg (10 mg Intravenous Given 01/28/12 1138)  sodium chloride 0.9 % bolus 1,000 mL (1000 mL Intravenous Given 01/28/12 1250)  fentaNYL (SUBLIMAZE) injection 50 mcg (50 mcg Intravenous Given 01/28/12 1306)     DIAGNOSTIC STUDIES: Oxygen Saturation is 99% on room air, normal by my interpretation.    COORDINATION OF CARE: 11:10AM-Advised pt to cut down on caffeine use and to stop smoking.  11:15AM-Discussed treatment plan with pt and pt agreed to plan.  2:15PM-Pt rechecked and is feeling better. Will continue with IV fluids.   Review of [facility] site shows she gets# 90  hydrocodone 7.5/500  around the 20th of the month from [place], [place], the last time was 01/11/2012  Results for orders placed during the hospital encounter of 01/28/12  URINALYSIS, ROUTINE W REFLEX MICROSCOPIC      Component Value Range   Color, Urine AMBER (*) YELLOW    APPearance CLOUDY (*) CLEAR    Specific Gravity, Urine >1.030 (*) 1.005 - 1.030    pH 5.5  5.0 - 8.0    Glucose, UA NEGATIVE  NEGATIVE (mg/dL)   Hgb urine dipstick TRACE (*) NEGATIVE    Bilirubin Urine SMALL (*) NEGATIVE    Ketones, ur NEGATIVE  NEGATIVE (mg/dL)   Protein, ur NEGATIVE  NEGATIVE (mg/dL)   Urobilinogen, UA 0.2  0.0 - 1.0 (mg/dL)   Nitrite NEGATIVE  NEGATIVE    Leukocytes, UA NEGATIVE  NEGATIVE   POCT PREGNANCY, URINE      Component Value Range   Preg Test, Ur NEGATIVE  NEGATIVE   URINE RAPID DRUG SCREEN (HOSP PERFORMED)      Component Value Range   Opiates NONE DETECTED  NONE DETECTED    Cocaine NONE DETECTED  NONE DETECTED    Benzodiazepines POSITIVE (*) NONE DETECTED    Amphetamines NONE DETECTED  NONE DETECTED    Tetrahydrocannabinol NONE DETECTED  NONE DETECTED    Barbiturates NONE DETECTED  NONE DETECTED   URINE MICROSCOPIC-ADD ON      Component Value Range   Squamous Epithelial / LPF MANY (*) RARE    WBC, UA 7-10  <3 (WBC/hpf)   RBC / HPF 7-10  <3 (RBC/hpf)   Bacteria, UA FEW (*) RARE    Laboratory interpretation all normal except contaminated urine   Dg Abd 1 View  01/28/2012  *RADIOLOGY REPORT*  Clinical Data: Flank pain and hematuria.  ABDOMEN - 1 VIEW  Comparison: 10/18/2011.  Findings: No definite renal or ureteral calcifications.  The bowel gas pattern is unremarkable.  The bony structures are intact.  IMPRESSION: No definite renal or ureteral calculi.  Original Report Authenticated By: P. Loralie Champagne, M.D.      1. Lt flank pain   2. Dehydration    New Prescriptions   PERCOCET 5-325 MG PER TABLET    Take 1 tablet by mouth every 6 (six) hours as needed for pain.   PROMETHAZINE (PHENERGAN) 25  MG SUPPOSITORY    Place 1 suppository (25 mg total) rectally every 6 (six) hours as needed for nausea.   TAMSULOSIN HCL (FLOMAX) 0.4 MG CAPS    Take 1 po QD until you pass the stone.   Plan discharge  Devoria Albe, MD, FACEP    MDM    I personally performed the services described in this documentation, which was scribed in my presence. The recorded information has been reviewed and considered. Devoria Albe, MD, Armando Gang        Ward Givens, MD 01/28/12 [phone]

## 2012-03-23 ENCOUNTER — Encounter (HOSPITAL_COMMUNITY): Payer: Self-pay

## 2012-03-23 ENCOUNTER — Emergency Department (HOSPITAL_COMMUNITY)
Admission: EM | Admit: 2012-03-23 | Discharge: 2012-03-23 | Disposition: A | Discharge status: Home / Self Care | Payer: Medicaid Other | Attending: Emergency Medicine | Admitting: Emergency Medicine

## 2012-03-23 DIAGNOSIS — X500XXA Overexertion from strenuous movement or load, initial encounter: Secondary | ICD-10-CM | POA: Insufficient documentation

## 2012-03-23 DIAGNOSIS — IMO0001 Reserved for inherently not codable concepts without codable children: Secondary | ICD-10-CM | POA: Insufficient documentation

## 2012-03-23 DIAGNOSIS — M545 Low back pain, unspecified: Secondary | ICD-10-CM | POA: Insufficient documentation

## 2012-03-23 DIAGNOSIS — S39012A Strain of muscle, fascia and tendon of lower back, initial encounter: Secondary | ICD-10-CM

## 2012-03-23 DIAGNOSIS — N3010 Interstitial cystitis (chronic) without hematuria: Secondary | ICD-10-CM | POA: Insufficient documentation

## 2012-03-23 DIAGNOSIS — S335XXA Sprain of ligaments of lumbar spine, initial encounter: Secondary | ICD-10-CM | POA: Insufficient documentation

## 2012-03-23 DIAGNOSIS — F17200 Nicotine dependence, unspecified, uncomplicated: Secondary | ICD-10-CM | POA: Insufficient documentation

## 2012-03-23 HISTORY — DX: Interstitial cystitis (chronic) without hematuria: N30.10

## 2012-03-23 MED ORDER — METHOCARBAMOL 750 MG PO TABS: ORAL_TABLET | ORAL | Status: DC

## 2012-03-23 MED ORDER — METHOCARBAMOL 500 MG PO TABS
ORAL_TABLET | ORAL | Status: AC
  Administered 2012-03-23: 1000 mg via ORAL
  Filled 2012-03-23: qty 2

## 2012-03-23 MED ORDER — PREDNISONE 50 MG PO TABS: 50.0000 mg | ORAL_TABLET | Freq: Every day | ORAL | Status: AC

## 2012-03-23 MED ORDER — METHOCARBAMOL 500 MG PO TABS
1000.0000 mg | ORAL_TABLET | Freq: Once | ORAL | Status: AC
  Administered 2012-03-23: 1000 mg via ORAL

## 2012-03-23 MED ORDER — PREDNISONE 20 MG PO TABS
ORAL_TABLET | ORAL | Status: AC
  Administered 2012-03-23: 60 mg via ORAL
  Filled 2012-03-23: qty 3

## 2012-03-23 MED ORDER — PREDNISONE 20 MG PO TABS
60.0000 mg | ORAL_TABLET | Freq: Once | ORAL | Status: AC
  Administered 2012-03-23: 60 mg via ORAL

## 2012-03-23 NOTE — ED Provider Notes (Signed)
History     CSN: [id number]  Arrival date & time 03/23/12  1218   First MD Initiated Contact with Patient 03/23/12 1309      Chief Complaint  Patient presents with  . Back Pain    (Consider location/radiation/quality/duration/timing/severity/associated sxs/prior treatment) HPI Comments: Injured lower back helping a friend move a couch.  Patient is a 31 y.o. female presenting with back pain. The history is provided by the patient. No language interpreter was used.  Back Pain  This is a recurrent problem. The problem occurs constantly. The problem has not changed since onset.The pain is associated with lifting heavy objects. The pain is present in the lumbar spine. The pain is moderate. The pain is the same all the time. Pertinent negatives include no numbness and no weakness. She has tried NSAIDs for the symptoms. The treatment provided no relief. Risk factors include obesity.    Past Medical History  Diagnosis Date  . Anxiety   . Fibromyalgia   . Restless leg   . Chronic back pain   . Kidney stones     interstitual cystitis  . GERD (gastroesophageal reflux disease)   . Headache     mygrains  . PONV (postoperative nausea and vomiting)   . Nephrolithiasis   . Interstitial cystitis     Past Surgical History  Procedure Date  . Cholecystectomy   . Tonsillectomy   . Tubal ligation     Family History  Problem Relation Age of Onset  . Hypertension Father   . Diabetes Father   . Cancer Other   . Diabetes Other     History  Substance Use Topics  . Smoking status: Current Everyday Smoker -- 1.0 packs/day for 15 years    Types: Cigarettes  . Smokeless tobacco: Never Used  . Alcohol Use: No    OB History    Grav Para Term Preterm Abortions TAB SAB Ect Mult Living   2 2  2      2       Review of Systems  Musculoskeletal: Positive for back pain.  Neurological: Negative for weakness and numbness.  All other systems reviewed and are negative.    Allergies    Omnicef; Codeine; Sulfa antibiotics; Amoxicillin; and Penicillins  Home Medications   Current Outpatient Rx  Name Route Sig Dispense Refill  . CITALOPRAM HYDROBROMIDE 40 MG PO TABS Oral Take 40 mg by mouth every evening.     Marland Kitchen CLONAZEPAM 0.5 MG PO TABS Oral Take 0.5 mg by mouth 2 (two) times daily as needed. For anxiety    . GABAPENTIN 300 MG PO CAPS Oral Take 900 mg by mouth at bedtime.    Marland Kitchen HYDROCODONE-ACETAMINOPHEN 7.5-650 MG PO TABS Oral Take 1 tablet by mouth every 6 (six) hours as needed. For pain    . IBUPROFEN 200 MG PO TABS Oral Take 800 mg by mouth every 6 (six) hours as needed. For pain    . PROMETHAZINE HCL 25 MG PO TABS Oral Take 25-50 mg by mouth every 6 (six) hours as needed. For nausea    . METHOCARBAMOL 750 MG PO TABS  One or two tabs po QID. 40 tablet 0  . PREDNISONE 50 MG PO TABS Oral Take 1 tablet (50 mg total) by mouth daily. 6 tablet 0  . SUMATRIPTAN SUCCINATE 100 MG PO TABS Oral Take 100 mg by mouth every 2 (two) hours as needed. For migraine pain      BP 125/89  Pulse 100  Temp(Src)  98.7 F (37.1 C) (Oral)  Resp 20  Ht 5\' 7"  (1.702 m)  Wt 185 lb (83.915 kg)  BMI 28.97 kg/m2  SpO2 100%  LMP 02/19/2012  Physical Exam  Nursing note and vitals reviewed. Constitutional: She is oriented to person, place, and time. She appears well-developed and well-nourished. No distress.  HENT:  Head: Normocephalic and atraumatic.  Eyes: EOM are normal.  Neck: Normal range of motion.  Cardiovascular: Normal rate, regular rhythm and normal heart sounds.   Pulmonary/Chest: Effort normal and breath sounds normal.  Abdominal: Soft. She exhibits no distension. There is no tenderness.  Musculoskeletal: She exhibits tenderness.       Lumbar back: She exhibits decreased range of motion, tenderness and pain. She exhibits no bony tenderness, no deformity, no laceration and no spasm.       B lumbar paravertebral muscle pain and PT.  Neurological: She is alert and oriented to  person, place, and time.  Skin: Skin is warm and dry.  Psychiatric: She has a normal mood and affect. Judgment normal.    ED Course  Procedures (including critical care time)  Labs Reviewed - No data to display No results found.   1. Lumbar strain       MDM  rx- pred 50 mg, 6 rx-robaxin 750 mg, 40 Ice F/u with dr. Shelle Iron.        Worthy Rancher, PA 03/23/12 1407

## 2012-03-23 NOTE — Discharge Instructions (Signed)
Back Pain, Adult Low back pain is very common. About 1 in 5 people have back pain.The cause of low back pain is rarely dangerous. The pain often gets better over time.About half of people with a sudden onset of back pain feel better in just 2 weeks. About 8 in 10 people feel better by 6 weeks.  CAUSES Some common causes of back pain include:  Strain of the muscles or ligaments supporting the spine.   Wear and tear (degeneration) of the spinal discs.   Arthritis.   Direct injury to the back.  DIAGNOSIS Most of the time, the direct cause of low back pain is not known.However, back pain can be treated effectively even when the exact cause of the pain is unknown.Answering your caregiver's questions about your overall health and symptoms is one of the most accurate ways to make sure the cause of your pain is not dangerous. If your caregiver needs more information, he or she may order lab work or imaging tests (X-rays or MRIs).However, even if imaging tests show changes in your back, this usually does not require surgery. HOME CARE INSTRUCTIONS For many people, back pain returns.Since low back pain is rarely dangerous, it is often a condition that people can learn to manageon their own.   Remain active. It is stressful on the back to sit or stand in one place. Do not sit, drive, or stand in one place for more than 30 minutes at a time. Take short walks on level surfaces as soon as pain allows.Try to increase the length of time you walk each day.   Do not stay in bed.Resting more than 1 or 2 days can delay your recovery.   Do not avoid exercise or work.Your body is made to move.It is not dangerous to be active, even though your back may hurt.Your back will likely heal faster if you return to being active before your pain is gone.   Pay attention to your body when you bend and lift. Many people have less discomfortwhen lifting if they bend their knees, keep the load close to their  bodies,and avoid twisting. Often, the most comfortable positions are those that put less stress on your recovering back.   Find a comfortable position to sleep. Use a firm mattress and lie on your side with your knees slightly bent. If you lie on your back, put a pillow under your knees.   Only take over-the-counter or prescription medicines as directed by your caregiver. Over-the-counter medicines to reduce pain and inflammation are often the most helpful.Your caregiver may prescribe muscle relaxant drugs.These medicines help dull your pain so you can more quickly return to your normal activities and healthy exercise.   Put ice on the injured area.   Put ice in a plastic bag.   Place a towel between your skin and the bag.   Leave the ice on for 15 to 20 minutes, 3 to 4 times a day for the first 2 to 3 days. After that, ice and heat may be alternated to reduce pain and spasms.   Ask your caregiver about trying back exercises and gentle massage. This may be of some benefit.   Avoid feeling anxious or stressed.Stress increases muscle tension and can worsen back pain.It is important to recognize when you are anxious or stressed and learn ways to manage it.Exercise is a great option.  SEEK MEDICAL CARE IF:  You have pain that is not relieved with rest or medicine.   You have   pain that does not improve in 1 week.   You have new symptoms.   You are generally not feeling well.  SEEK IMMEDIATE MEDICAL CARE IF:   You have pain that radiates from your back into your legs.   You develop new bowel or bladder control problems.   You have unusual weakness or numbness in your arms or legs.   You develop nausea or vomiting.   You develop abdominal pain.   You feel faint.  Document Released: 11/06/2005 Document Revised: 10/26/2011 Document Reviewed: 03/27/2011 [facility] Patient Information 2012 [place], [place].Cryotherapy Cryotherapy means treatment with cold. Ice or gel packs can be  used to reduce both pain and swelling. Ice is the most helpful within the first 24 to 48 hours after an injury or flareup from overusing a muscle or joint. Sprains, strains, spasms, burning pain, shooting pain, and aches can all be eased with ice. Ice can also be used when recovering from surgery. Ice is effective, has very few side effects, and is safe for most people to use. PRECAUTIONS  Ice is not a safe treatment option for people with:  Raynaud's phenomenon. This is a condition affecting small blood vessels in the extremities. Exposure to cold may cause your problems to return.   Cold hypersensitivity. There are many forms of cold hypersensitivity, including:   Cold urticaria. Red, itchy hives appear on the skin when the tissues begin to warm after being iced.   Cold erythema. This is a red, itchy rash caused by exposure to cold.   Cold hemoglobinuria. Red blood cells break down when the tissues begin to warm after being iced. The hemoglobin that carry oxygen are passed into the urine because they cannot combine with blood proteins fast enough.   Numbness or altered sensitivity in the area being iced.  If you have any of the following conditions, do not use ice until you have discussed cryotherapy with your caregiver:  Heart conditions, such as arrhythmia, angina, or chronic heart disease.   High blood pressure.   Healing wounds or open skin in the area being iced.   Current infections.   Rheumatoid arthritis.   Poor circulation.   Diabetes.  Ice slows the blood flow in the region it is applied. This is beneficial when trying to stop inflamed tissues from spreading irritating chemicals to surrounding tissues. However, if you expose your skin to cold temperatures for too long or without the proper protection, you can damage your skin or nerves. Watch for signs of skin damage due to cold. HOME CARE INSTRUCTIONS Follow these tips to use ice and cold packs safely.  Place a dry or  damp towel between the ice and skin. A damp towel will cool the skin more quickly, so you may need to shorten the time that the ice is used.   For a more rapid response, add gentle compression to the ice.   Ice for no more than 10 to 20 minutes at a time. The bonier the area you are icing, the less time it will take to get the benefits of ice.   Check your skin after 5 minutes to make sure there are no signs of a poor response to cold or skin damage.   Rest 20 minutes or more in between uses.   Once your skin is numb, you can end your treatment. You can test numbness by very lightly touching your skin. The touch should be so light that you do not see the skin dimple from  the pressure of your fingertip. When using ice, most people will feel these normal sensations in this order: cold, burning, aching, and numbness.   Do not use ice on someone who cannot communicate their responses to pain, such as small children or people with dementia.  HOW TO MAKE AN ICE PACK Ice packs are the most common way to use ice therapy. Other methods include ice massage, ice baths, and cryo-sprays. Muscle creams that cause a cold, tingly feeling do not offer the same benefits that ice offers and should not be used as a substitute unless recommended by your caregiver. To make an ice pack, do one of the following:  Place crushed ice or a bag of frozen vegetables in a sealable plastic bag. Squeeze out the excess air. Place this bag inside another plastic bag. Slide the bag into a pillowcase or place a damp towel between your skin and the bag.   Mix 3 parts water with 1 part rubbing alcohol. Freeze the mixture in a sealable plastic bag. When you remove the mixture from the freezer, it will be slushy. Squeeze out the excess air. Place this bag inside another plastic bag. Slide the bag into a pillowcase or place a damp towel between your skin and the bag.  SEEK MEDICAL CARE IF:  You develop white spots on your skin. This  may give the skin a blotchy (mottled) appearance.   Your skin turns blue or pale.   Your skin becomes waxy or hard.   Your swelling gets worse.  MAKE SURE YOU:   Understand these instructions.   Will watch your condition.   Will get help right away if you are not doing well or get worse.  Document Released: 07/03/2011 Document Revised: 10/26/2011 Document Reviewed: 07/03/2011 [facility] Patient Information 2012 [place], [place]. Take the meds as directed.  Do NOT take any NSAID's while taking the prednisone.  Apply ice several times daily.  Follow  Up with dr. Shelle Iron as needed.

## 2012-03-23 NOTE — ED Provider Notes (Signed)
Medical screening examination/treatment/procedure(s) were performed by non-physician practitioner and as supervising physician I was immediately available for consultation/collaboration.   Joya Gaskins, MD 03/23/12 1435

## 2012-03-23 NOTE — ED Notes (Signed)
Pt presents with lower back pain that started today when moving a couch. Pt has history of chronic back pain. No deformity noted. Pt denies loss of bowel/bladder function.

## 2012-03-23 NOTE — ED Notes (Signed)
Pt reports that she was moving furniture today and is now having low back  Pain.

## 2012-05-11 ENCOUNTER — Emergency Department (HOSPITAL_COMMUNITY)
Admission: EM | Admit: 2012-05-11 | Discharge: 2012-05-11 | Disposition: A | Discharge status: Home / Self Care | Payer: Medicaid Other | Attending: Emergency Medicine | Admitting: Emergency Medicine

## 2012-05-11 ENCOUNTER — Encounter (HOSPITAL_COMMUNITY): Payer: Self-pay

## 2012-05-11 DIAGNOSIS — Z882 Allergy status to sulfonamides status: Secondary | ICD-10-CM | POA: Insufficient documentation

## 2012-05-11 DIAGNOSIS — Z87442 Personal history of urinary calculi: Secondary | ICD-10-CM | POA: Insufficient documentation

## 2012-05-11 DIAGNOSIS — M5432 Sciatica, left side: Secondary | ICD-10-CM

## 2012-05-11 DIAGNOSIS — G2581 Restless legs syndrome: Secondary | ICD-10-CM | POA: Insufficient documentation

## 2012-05-11 DIAGNOSIS — F17200 Nicotine dependence, unspecified, uncomplicated: Secondary | ICD-10-CM | POA: Insufficient documentation

## 2012-05-11 DIAGNOSIS — Z880 Allergy status to penicillin: Secondary | ICD-10-CM | POA: Insufficient documentation

## 2012-05-11 DIAGNOSIS — X500XXA Overexertion from strenuous movement or load, initial encounter: Secondary | ICD-10-CM | POA: Insufficient documentation

## 2012-05-11 DIAGNOSIS — S335XXA Sprain of ligaments of lumbar spine, initial encounter: Secondary | ICD-10-CM | POA: Insufficient documentation

## 2012-05-11 DIAGNOSIS — K219 Gastro-esophageal reflux disease without esophagitis: Secondary | ICD-10-CM | POA: Insufficient documentation

## 2012-05-11 DIAGNOSIS — S39012A Strain of muscle, fascia and tendon of lower back, initial encounter: Secondary | ICD-10-CM

## 2012-05-11 DIAGNOSIS — M5430 Sciatica, unspecified side: Secondary | ICD-10-CM | POA: Insufficient documentation

## 2012-05-11 DIAGNOSIS — F411 Generalized anxiety disorder: Secondary | ICD-10-CM | POA: Insufficient documentation

## 2012-05-11 MED ORDER — PREDNISONE 50 MG PO TABS: ORAL_TABLET | ORAL | Status: AC

## 2012-05-11 MED ORDER — CYCLOBENZAPRINE HCL 10 MG PO TABS
10.0000 mg | ORAL_TABLET | Freq: Once | ORAL | Status: AC
  Administered 2012-05-11: 10 mg via ORAL
  Filled 2012-05-11: qty 1

## 2012-05-11 MED ORDER — CYCLOBENZAPRINE HCL 10 MG PO TABS: ORAL_TABLET | ORAL | Status: DC

## 2012-05-11 MED ORDER — PREDNISONE 20 MG PO TABS
60.0000 mg | ORAL_TABLET | Freq: Once | ORAL | Status: AC
  Administered 2012-05-11: 60 mg via ORAL
  Filled 2012-05-11: qty 3

## 2012-05-11 NOTE — ED Notes (Signed)
Pt verbalized understanding of d/c instructions and without any further questions

## 2012-05-11 NOTE — ED Notes (Signed)
Pt states they were moving furniture yesterday. Complain of pain in lower back today

## 2012-05-11 NOTE — ED Notes (Signed)
C/o lower back pain that radiates to left hip and left leg, hx of sciatica pain, started yesterday after moving furniture

## 2012-05-11 NOTE — ED Notes (Signed)
R. Miller, PA at bedside  

## 2012-05-11 NOTE — ED Provider Notes (Signed)
History     CSN: [id number]  Arrival date & time 05/11/12  1209   First MD Initiated Contact with Patient 05/11/12 1358      Chief Complaint  Patient presents with  . Back Pain    (Consider location/radiation/quality/duration/timing/severity/associated sxs/prior treatment) HPI Comments: Lifting and moving furniture this AM.  Radiates to L thigh.  Patient is a 31 y.o. female presenting with back pain. The history is provided by the patient. No language interpreter was used.  Back Pain  This is a new problem. The current episode started 3 to 5 hours ago. The problem occurs constantly. The problem has not changed since onset.The pain is associated with lifting heavy objects. The pain radiates to the left thigh. The pain is at a severity of 7/10. The symptoms are aggravated by bending, twisting and certain positions. The pain is the same all the time. Associated symptoms include leg pain. Pertinent negatives include no numbness, no bowel incontinence, no perianal numbness, no bladder incontinence, no paresthesias, no paresis, no tingling and no weakness. She has tried NSAIDs for the symptoms. Risk factors include obesity.    Past Medical History  Diagnosis Date  . Anxiety   . Fibromyalgia   . Restless leg   . Chronic back pain   . Kidney stones     interstitual cystitis  . GERD (gastroesophageal reflux disease)   . Headache     mygrains  . PONV (postoperative nausea and vomiting)   . Nephrolithiasis   . Interstitial cystitis     Past Surgical History  Procedure Date  . Cholecystectomy   . Tonsillectomy   . Tubal ligation     Family History  Problem Relation Age of Onset  . Hypertension Father   . Diabetes Father   . Cancer Other   . Diabetes Other     History  Substance Use Topics  . Smoking status: Current Everyday Smoker -- 1.0 packs/day for 15 years    Types: Cigarettes  . Smokeless tobacco: Never Used  . Alcohol Use: No    OB History    Grav Para Term  Preterm Abortions TAB SAB Ect Mult Living   2 2  2      2       Review of Systems  Gastrointestinal: Negative for bowel incontinence.  Genitourinary: Negative for bladder incontinence.  Musculoskeletal: Positive for back pain.  Neurological: Negative for tingling, weakness, numbness and paresthesias.  All other systems reviewed and are negative.    Allergies  Omnicef; Codeine; Sulfa antibiotics; Amoxicillin; and Penicillins  Home Medications   Current Outpatient Rx  Name Route Sig Dispense Refill  . CITALOPRAM HYDROBROMIDE 40 MG PO TABS Oral Take 40 mg by mouth every evening.     Marland Kitchen CLONAZEPAM 0.5 MG PO TABS Oral Take 0.5 mg by mouth 2 (two) times daily as needed. For anxiety    . CYCLOBENZAPRINE HCL 10 MG PO TABS  1/2 to one tab po TID for low back pain 20 tablet 0  . GABAPENTIN 300 MG PO CAPS Oral Take 900 mg by mouth at bedtime.    Marland Kitchen HYDROCODONE-ACETAMINOPHEN 7.5-650 MG PO TABS Oral Take 1 tablet by mouth every 6 (six) hours as needed. For pain    . IBUPROFEN 200 MG PO TABS Oral Take 800 mg by mouth every 6 (six) hours as needed. For pain    . METHOCARBAMOL 750 MG PO TABS  One or two tabs po QID. 40 tablet 0  . PREDNISONE 50 MG  PO TABS  One tab po QD 6 tablet 0  . PROMETHAZINE HCL 25 MG PO TABS Oral Take 25-50 mg by mouth every 6 (six) hours as needed. For nausea    . SUMATRIPTAN SUCCINATE 100 MG PO TABS Oral Take 100 mg by mouth every 2 (two) hours as needed. For migraine pain      BP 117/82  Pulse 94  Temp 98.4 F (36.9 C) (Oral)  Resp 18  Ht 5\' 6"  (1.676 m)  Wt 211 lb (95.709 kg)  BMI 34.06 kg/m2  SpO2 98%  LMP 03/20/2012  Physical Exam  Nursing note and vitals reviewed. Constitutional: She is oriented to person, place, and time. She appears well-developed and well-nourished. No distress.  HENT:  Head: Normocephalic and atraumatic.  Eyes: EOM are normal.  Neck: Normal range of motion.  Cardiovascular: Normal rate, regular rhythm and normal heart sounds.     Pulmonary/Chest: Effort normal and breath sounds normal.  Abdominal: Soft. She exhibits no distension. There is no tenderness.  Musculoskeletal:       Lumbar back: She exhibits decreased range of motion and tenderness.       Back:  Neurological: She is alert and oriented to person, place, and time. She has normal strength. She displays normal reflexes. No cranial nerve deficit or sensory deficit. Coordination and gait normal. GCS eye subscore is 4. GCS verbal subscore is 5. GCS motor subscore is 6.  Skin: Skin is warm and dry.  Psychiatric: She has a normal mood and affect. Judgment normal.    ED Course  Procedures (including critical care time)  Labs Reviewed - No data to display No results found.   1. Lumbar strain   2. Left sided sciatica       MDM  rx flexeril 10 mg, 20 rx-prednisone 50 mg, 6 No saddle dysthesia or incontinence.        Worthy Rancher, PA 05/11/12 1421

## 2012-05-11 NOTE — Discharge Instructions (Signed)
Back Pain, Adult Low back pain is very common. About 1 in 5 people have back pain.The cause of low back pain is rarely dangerous. The pain often gets better over time.About half of people with a sudden onset of back pain feel better in just 2 weeks. About 8 in 10 people feel better by 6 weeks.  CAUSES Some common causes of back pain include:  Strain of the muscles or ligaments supporting the spine.   Wear and tear (degeneration) of the spinal discs.   Arthritis.   Direct injury to the back.  DIAGNOSIS Most of the time, the direct cause of low back pain is not known.However, back pain can be treated effectively even when the exact cause of the pain is unknown.Answering your caregiver's questions about your overall health and symptoms is one of the most accurate ways to make sure the cause of your pain is not dangerous. If your caregiver needs more information, he or she may order lab work or imaging tests (X-rays or MRIs).However, even if imaging tests show changes in your back, this usually does not require surgery. HOME CARE INSTRUCTIONS For many people, back pain returns.Since low back pain is rarely dangerous, it is often a condition that people can learn to manageon their own.   Remain active. It is stressful on the back to sit or stand in one place. Do not sit, drive, or stand in one place for more than 30 minutes at a time. Take short walks on level surfaces as soon as pain allows.Try to increase the length of time you walk each day.   Do not stay in bed.Resting more than 1 or 2 days can delay your recovery.   Do not avoid exercise or work.Your body is made to move.It is not dangerous to be active, even though your back may hurt.Your back will likely heal faster if you return to being active before your pain is gone.   Pay attention to your body when you bend and lift. Many people have less discomfortwhen lifting if they bend their knees, keep the load close to their  bodies,and avoid twisting. Often, the most comfortable positions are those that put less stress on your recovering back.   Find a comfortable position to sleep. Use a firm mattress and lie on your side with your knees slightly bent. If you lie on your back, put a pillow under your knees.   Only take over-the-counter or prescription medicines as directed by your caregiver. Over-the-counter medicines to reduce pain and inflammation are often the most helpful.Your caregiver may prescribe muscle relaxant drugs.These medicines help dull your pain so you can more quickly return to your normal activities and healthy exercise.   Put ice on the injured area.   Put ice in a plastic bag.   Place a towel between your skin and the bag.   Leave the ice on for 15 to 20 minutes, 3 to 4 times a day for the first 2 to 3 days. After that, ice and heat may be alternated to reduce pain and spasms.   Ask your caregiver about trying back exercises and gentle massage. This may be of some benefit.   Avoid feeling anxious or stressed.Stress increases muscle tension and can worsen back pain.It is important to recognize when you are anxious or stressed and learn ways to manage it.Exercise is a great option.  SEEK MEDICAL CARE IF:  You have pain that is not relieved with rest or medicine.   You have   pain that does not improve in 1 week.   You have new symptoms.   You are generally not feeling well.  SEEK IMMEDIATE MEDICAL CARE IF:   You have pain that radiates from your back into your legs.   You develop new bowel or bladder control problems.   You have unusual weakness or numbness in your arms or legs.   You develop nausea or vomiting.   You develop abdominal pain.   You feel faint.  Document Released: 11/06/2005 Document Revised: 10/26/2011 Document Reviewed: 03/27/2011 [facility] Patient Information 2012 [employer], [place].   Take the meds as directed.  Do NOT take any NSAID's while taking the  prednisone.  Apply ice several times daily to lower. Back.  Follow up with your MD as needed.

## 2012-05-14 NOTE — ED Provider Notes (Signed)
Medical screening examination/treatment/procedure(s) were performed by non-physician practitioner and as supervising physician I was immediately available for consultation/collaboration.   Shelda Jakes, MD 05/14/12 1106

## 2012-06-03 ENCOUNTER — Encounter (HOSPITAL_COMMUNITY): Payer: Self-pay | Admitting: *Deleted

## 2012-06-03 ENCOUNTER — Emergency Department (HOSPITAL_COMMUNITY)
Admission: EM | Admit: 2012-06-03 | Discharge: 2012-06-03 | Disposition: A | Discharge status: Home / Self Care | Payer: Medicaid Other | Attending: Emergency Medicine | Admitting: Emergency Medicine

## 2012-06-03 ENCOUNTER — Emergency Department (HOSPITAL_COMMUNITY): Payer: Medicaid Other

## 2012-06-03 DIAGNOSIS — K219 Gastro-esophageal reflux disease without esophagitis: Secondary | ICD-10-CM | POA: Insufficient documentation

## 2012-06-03 DIAGNOSIS — F411 Generalized anxiety disorder: Secondary | ICD-10-CM | POA: Insufficient documentation

## 2012-06-03 DIAGNOSIS — M545 Low back pain, unspecified: Secondary | ICD-10-CM

## 2012-06-03 DIAGNOSIS — Z9089 Acquired absence of other organs: Secondary | ICD-10-CM | POA: Insufficient documentation

## 2012-06-03 DIAGNOSIS — F17200 Nicotine dependence, unspecified, uncomplicated: Secondary | ICD-10-CM | POA: Insufficient documentation

## 2012-06-03 DIAGNOSIS — R300 Dysuria: Secondary | ICD-10-CM | POA: Insufficient documentation

## 2012-06-03 DIAGNOSIS — Z79899 Other long term (current) drug therapy: Secondary | ICD-10-CM | POA: Insufficient documentation

## 2012-06-03 DIAGNOSIS — IMO0001 Reserved for inherently not codable concepts without codable children: Secondary | ICD-10-CM | POA: Insufficient documentation

## 2012-06-03 DIAGNOSIS — N200 Calculus of kidney: Secondary | ICD-10-CM | POA: Insufficient documentation

## 2012-06-03 DIAGNOSIS — G8929 Other chronic pain: Secondary | ICD-10-CM | POA: Insufficient documentation

## 2012-06-03 LAB — URINALYSIS, ROUTINE W REFLEX MICROSCOPIC
Hgb urine dipstick: NEGATIVE
Ketones, ur: NEGATIVE mg/dL
Leukocytes, UA: NEGATIVE
Protein, ur: NEGATIVE mg/dL
Urobilinogen, UA: 0.2 mg/dL (ref 0.0–1.0)

## 2012-06-03 LAB — PREGNANCY, URINE: Preg Test, Ur: NEGATIVE

## 2012-06-03 MED ORDER — HYDROCODONE-ACETAMINOPHEN 5-325 MG PO TABS: ORAL_TABLET | ORAL | Status: AC

## 2012-06-03 MED ORDER — ONDANSETRON 8 MG PO TBDP
8.0000 mg | ORAL_TABLET | Freq: Once | ORAL | Status: AC
Start: 2012-06-03 — End: 2012-06-03
  Administered 2012-06-03: 8 mg via ORAL
  Filled 2012-06-03: qty 1

## 2012-06-03 MED ORDER — HYDROMORPHONE HCL PF 1 MG/ML IJ SOLN
1.0000 mg | Freq: Once | INTRAMUSCULAR | Status: AC
  Administered 2012-06-03: 1 mg via INTRAMUSCULAR
  Filled 2012-06-03: qty 1

## 2012-06-03 MED ORDER — CYCLOBENZAPRINE HCL 10 MG PO TABS: 10.0000 mg | ORAL_TABLET | Freq: Three times a day (TID) | ORAL | Status: AC | PRN

## 2012-06-03 NOTE — ED Notes (Signed)
Nausea ,onset this am. Pain lt flank for 3 hours.  Vomiting today.  NO fever or chills.

## 2012-06-03 NOTE — ED Provider Notes (Signed)
History     CSN: [id number]  Arrival date & time 06/03/12  1552   First MD Initiated Contact with Patient 06/03/12 1623      Chief Complaint  Patient presents with  . Flank Pain     HPI Pt was seen at 1635. Isabella Buchanan is a 31 y.o. female who presents to the Emergency Department complaining of gradual onset and persistence of constant acute flair of her chronic left sided back "pain" that began approx 3 hours ago.  Has been assoc with mild dysuria as well as several episodes of N/V.  Describes the pain as "either my kidney stones or my back pain." Pain worsens with palpation of the area and body position changes. Denies fevers, no diarrhea, no CP/SOB, no cough, no rash, no vaginal bleeding/discharge, no hematuria.  Denies incont/retention of bowel or bladder, no saddle anesthesia, no focal motor weakness, no tingling/numbness in extremities.  The patient has a significant history of similar symptoms previously, recently being evaluated for this complaint and multiple prior evals for same.    Past Medical History  Diagnosis Date  . Anxiety   . Fibromyalgia   . Restless leg   . Chronic back pain   . GERD (gastroesophageal reflux disease)   . Headache     mygrains  . PONV (postoperative nausea and vomiting)   . Interstitial cystitis   . Kidney stones     interstitual cystitis  . Nephrolithiasis     Past Surgical History  Procedure Date  . Cholecystectomy   . Tonsillectomy   . Tubal ligation   . Tympanoplasty     Family History  Problem Relation Age of Onset  . Hypertension Father   . Diabetes Father   . Cancer Other   . Diabetes Other     History  Substance Use Topics  . Smoking status: Current Everyday Smoker -- 1.0 packs/day for 15 years    Types: Cigarettes  . Smokeless tobacco: Never Used  . Alcohol Use: No    OB History    Grav Para Term Preterm Abortions TAB SAB Ect Mult Living   2 2  2      2       Review of Systems ROS: Statement: All systems  negative except as marked or noted in the HPI; Constitutional: Negative for fever and chills. ; ; Eyes: Negative for eye pain, redness and discharge. ; ; ENMT: Negative for ear pain, hoarseness, nasal congestion, sinus pressure and sore throat. ; ; Cardiovascular: Negative for chest pain, palpitations, diaphoresis, dyspnea and peripheral edema. ; ; Respiratory: Negative for cough, wheezing and stridor. ; ; Gastrointestinal: +N/V. Negative for diarrhea, abdominal pain, blood in stool, hematemesis, jaundice and rectal bleeding. . ; ; Genitourinary: +dysuria, flank/back pain. Negative for hematuria. ; ; Musculoskeletal: Negative for back pain and neck pain. Negative for swelling and trauma.; ; Skin: Negative for pruritus, rash, abrasions, blisters, bruising and skin lesion.; ; Neuro: Negative for headache, lightheadedness and neck stiffness. Negative for weakness, altered level of consciousness , altered mental status, extremity weakness, paresthesias, involuntary movement, seizure and syncope.     Allergies  Omnicef; Codeine; Sulfa antibiotics; Amoxicillin; and Penicillins  Home Medications   Current Outpatient Rx  Name Route Sig Dispense Refill  . CITALOPRAM HYDROBROMIDE 40 MG PO TABS Oral Take 40 mg by mouth every evening.     Marland Kitchen CLONAZEPAM 0.5 MG PO TABS Oral Take 0.5 mg by mouth 2 (two) times daily as needed. For anxiety    .  CYCLOBENZAPRINE HCL 10 MG PO TABS  1/2 to one tab po TID for low back pain 20 tablet 0  . GABAPENTIN 300 MG PO CAPS Oral Take 900 mg by mouth at bedtime.    Marland Kitchen HYDROCODONE-ACETAMINOPHEN 7.5-650 MG PO TABS Oral Take 1 tablet by mouth every 6 (six) hours as needed. For pain    . IBUPROFEN 200 MG PO TABS Oral Take 800 mg by mouth every 6 (six) hours as needed. For pain    . METHOCARBAMOL 750 MG PO TABS  One or two tabs po QID. 40 tablet 0  . PROMETHAZINE HCL 25 MG PO TABS Oral Take 25-50 mg by mouth every 6 (six) hours as needed. For nausea    . SUMATRIPTAN SUCCINATE 100 MG PO  TABS Oral Take 100 mg by mouth every 2 (two) hours as needed. For migraine pain      BP 126/85  Pulse 105  Temp 98.4 F (36.9 C) (Oral)  Resp 20  Ht 5\' 7"  (1.702 m)  Wt 195 lb (88.451 kg)  BMI 30.54 kg/m2  SpO2 98%  LMP 05/06/2012  Physical Exam 1640: Physical examination:  Nursing notes reviewed; Vital signs and O2 SAT reviewed;  Constitutional: Well developed, Well nourished, Well hydrated, In no acute distress; Head:  Normocephalic, atraumatic; Eyes: EOMI, PERRL, No scleral icterus; ENMT: Mouth and pharynx normal, Mucous membranes moist; Neck: Supple, Full range of motion, No lymphadenopathy; Cardiovascular: Regular rate and rhythm, No murmur, rub, or gallop; Respiratory: Breath sounds clear & equal bilaterally, No rales, rhonchi, wheezes.  Speaking full sentences with ease, Normal respiratory effort/excursion; Chest: Nontender, Movement normal; Abdomen: Soft, Nontender, Nondistended, Normal bowel sounds; Genitourinary: No CVA tenderness; Spine:  No midline CS, TS, LS tenderness.  +TTP left lumbar paraspinal muscles.;;   Extremities: Pulses normal, No tenderness, No edema, No calf edema or asymmetry.; Neuro: AA&Ox3, Major CN grossly intact.  Speech clear. Strength 5/5 equal bilat UE's and LE's, including great toe dorsiflexion.  DTR 2/4 equal bilat UE's and LE's.  No gross sensory deficits.  Neg straight leg raises bilat.;; Skin: Color normal, Warm, Dry, no rash.   ED Course  Procedures    MDM  MDM Reviewed: previous chart, nursing note and vitals Reviewed previous: CT scan Interpretation: CT scan and labs   Results for orders placed during the hospital encounter of 06/03/12  URINALYSIS, ROUTINE W REFLEX MICROSCOPIC      Component Value Range   Color, Urine AMBER (*) YELLOW   APPearance CLOUDY (*) CLEAR   Specific Gravity, Urine 1.015  1.005 - 1.030   pH 7.0  5.0 - 8.0   Glucose, UA NEGATIVE  NEGATIVE mg/dL   Hgb urine dipstick NEGATIVE  NEGATIVE   Bilirubin Urine NEGATIVE   NEGATIVE   Ketones, ur NEGATIVE  NEGATIVE mg/dL   Protein, ur NEGATIVE  NEGATIVE mg/dL   Urobilinogen, UA 0.2  0.0 - 1.0 mg/dL   Nitrite NEGATIVE  NEGATIVE   Leukocytes, UA NEGATIVE  NEGATIVE  PREGNANCY, URINE      Component Value Range   Preg Test, Ur NEGATIVE  NEGATIVE   Ct Abdomen Pelvis Wo Contrast 06/03/2012  *RADIOLOGY REPORT*  Clinical Data: Flank pain.  History of kidney stones.  CT ABDOMEN AND PELVIS WITHOUT CONTRAST  Technique:  Multidetector CT imaging of the abdomen and pelvis was performed following the standard protocol without intravenous contrast.  Comparison: CT of abdomen and pelvis 03/13/2012.  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  Abdomen/Pelvis:  10 mm nonobstructive calculus in the left renal collecting system.  No additional calculi are noted within the right renal collecting system, along the course of either ureter, or within the lumen of the urinary bladder.  No hydroureteronephrosis or perinephric stranding to suggest urinary tract obstruction at this time. Normal appendix.  Status post cholecystectomy.  The unenhanced appearance of the liver, pancreas, spleen and bilateral adrenal glands is unremarkable.  No ascites or pneumoperitoneum and no pathologic distension of bowel.  No definite pathologic lymphadenopathy identified within the abdomen or pelvis on this noncontrast CT examination.  Uterus and bilateral ovaries are unremarkable.  Musculoskeletal: There are no aggressive appearing lytic or blastic lesions noted in the visualized portions of the skeleton.  IMPRESSION: 1.  3 mm nonobstructive calculus in the left renal collecting system. 2.  No ureteral calculi or findings of  urinary tract obstruction at this time. 3.  Normal appendix. 4.  Status post cholecystectomy.  Original Report Authenticated By: Florencia Reasons, M.D.      5:56 PM:  Improved after meds.  No N/V while in the ED.  No ureteral calculi.  Appears msk pain at this time, will tx symptomatically.  Dx testing d/w pt.  Questions answered.  Verb understanding, agreeable to d/c home with outpt f/u.      Laray Anger, DO 06/05/12 2220

## 2012-09-02 ENCOUNTER — Encounter (HOSPITAL_COMMUNITY): Payer: Self-pay | Admitting: *Deleted

## 2012-09-02 ENCOUNTER — Emergency Department (HOSPITAL_COMMUNITY): Payer: Medicaid Other

## 2012-09-02 ENCOUNTER — Emergency Department (HOSPITAL_COMMUNITY)
Admission: EM | Admit: 2012-09-02 | Discharge: 2012-09-02 | Disposition: A | Discharge status: Home / Self Care | Payer: Medicaid Other | Attending: Emergency Medicine | Admitting: Emergency Medicine

## 2012-09-02 DIAGNOSIS — K219 Gastro-esophageal reflux disease without esophagitis: Secondary | ICD-10-CM | POA: Insufficient documentation

## 2012-09-02 DIAGNOSIS — S7000XA Contusion of unspecified hip, initial encounter: Secondary | ICD-10-CM | POA: Insufficient documentation

## 2012-09-02 DIAGNOSIS — M25559 Pain in unspecified hip: Secondary | ICD-10-CM | POA: Insufficient documentation

## 2012-09-02 DIAGNOSIS — F411 Generalized anxiety disorder: Secondary | ICD-10-CM | POA: Insufficient documentation

## 2012-09-02 DIAGNOSIS — M545 Low back pain, unspecified: Secondary | ICD-10-CM | POA: Insufficient documentation

## 2012-09-02 DIAGNOSIS — S300XXA Contusion of lower back and pelvis, initial encounter: Secondary | ICD-10-CM

## 2012-09-02 DIAGNOSIS — S20229A Contusion of unspecified back wall of thorax, initial encounter: Secondary | ICD-10-CM | POA: Insufficient documentation

## 2012-09-02 DIAGNOSIS — W108XXA Fall (on) (from) other stairs and steps, initial encounter: Secondary | ICD-10-CM | POA: Insufficient documentation

## 2012-09-02 DIAGNOSIS — F17200 Nicotine dependence, unspecified, uncomplicated: Secondary | ICD-10-CM | POA: Insufficient documentation

## 2012-09-02 DIAGNOSIS — IMO0001 Reserved for inherently not codable concepts without codable children: Secondary | ICD-10-CM | POA: Insufficient documentation

## 2012-09-02 DIAGNOSIS — Z79899 Other long term (current) drug therapy: Secondary | ICD-10-CM | POA: Insufficient documentation

## 2012-09-02 DIAGNOSIS — M533 Sacrococcygeal disorders, not elsewhere classified: Secondary | ICD-10-CM | POA: Insufficient documentation

## 2012-09-02 HISTORY — DX: Polycystic ovarian syndrome: E28.2

## 2012-09-02 LAB — URINALYSIS, ROUTINE W REFLEX MICROSCOPIC
Bilirubin Urine: NEGATIVE
Hgb urine dipstick: NEGATIVE
Ketones, ur: NEGATIVE mg/dL
Nitrite: NEGATIVE
Specific Gravity, Urine: <1.005 — ABNORMAL LOW (ref 1.005–1.030)
pH: 7 (ref 5.0–8.0)

## 2012-09-02 MED ORDER — MORPHINE SULFATE 4 MG/ML IJ SOLN
6.0000 mg | Freq: Once | INTRAMUSCULAR | Status: AC
  Administered 2012-09-02: 6 mg via INTRAMUSCULAR
  Filled 2012-09-02: qty 2

## 2012-09-02 MED ORDER — CYCLOBENZAPRINE HCL 10 MG PO TABS: 10.0000 mg | ORAL_TABLET | Freq: Three times a day (TID) | ORAL | Status: DC | PRN

## 2012-09-02 NOTE — ED Notes (Signed)
Pt reports that she slipped and fell last night, she injured left lower back, denies loc, cont. To have pain this am. Able to walk.

## 2012-09-02 NOTE — ED Notes (Signed)
Pt slipped on wet steps yesterday, falling down 5 wooden steps, pt hit her lower back and left hip area. C/o pain to both areas. No abrasion or bruising noted.

## 2012-09-02 NOTE — ED Provider Notes (Signed)
History     CSN: [id number]  Arrival date & time 09/02/12  0911   First MD Initiated Contact with Patient 09/02/12 [phone]      Chief Complaint  Patient presents with  . Back Pain    (Consider location/radiation/quality/duration/timing/severity/associated sxs/prior treatment) HPI Comments: Patient complains of left lower back pain and left hip pain that began last evening. She states pain began when she slipped on some wet steps and fell. She states that she struck the lower portion of her back on the edge of a step. She states the back pain and hip pain are worse with weightbearing and improves with rest. She has been taking her regular pain medication as directed without any improvement in her pain. She denies any incontinence, urinary symptoms, saddle anesthesia"s, or numbness or weakness to the lower extremities.  Patient is a 31 y.o. female presenting with back pain. The history is provided by the patient.  Back Pain  This is a new problem. The current episode started yesterday. The problem occurs constantly. The problem has not changed since onset.The pain is associated with falling. The pain is present in the lumbar spine, sacro-iliac joint and gluteal region. The quality of the pain is described as aching. Radiates to: left hip. The pain is moderate. The symptoms are aggravated by bending, twisting and certain positions. Pertinent negatives include no chest pain, no fever, no numbness, no abdominal pain, no abdominal swelling, no bowel incontinence, no perianal numbness, no bladder incontinence, no dysuria, no pelvic pain, no leg pain, no paresthesias, no paresis, no tingling and no weakness. She has tried analgesics for the symptoms. The treatment provided no relief.    Past Medical History  Diagnosis Date  . Anxiety   . Fibromyalgia   . Restless leg   . Chronic back pain   . GERD (gastroesophageal reflux disease)   . Headache     mygrains  . PONV (postoperative nausea and  vomiting)   . Interstitial cystitis   . Kidney stones     interstitual cystitis  . Nephrolithiasis   . Polycystic ovarian syndrome     Past Surgical History  Procedure Date  . Cholecystectomy   . Tonsillectomy   . Tubal ligation   . Tympanoplasty     Family History  Problem Relation Age of Onset  . Hypertension Father   . Diabetes Father   . Cancer Other   . Diabetes Other     History  Substance Use Topics  . Smoking status: Current Every Day Smoker -- 1.0 packs/day for 15 years    Types: Cigarettes  . Smokeless tobacco: Never Used  . Alcohol Use: No    OB History    Grav Para Term Preterm Abortions TAB SAB Ect Mult Living   2 2  2      2       Review of Systems  Constitutional: Negative for fever.  Respiratory: Negative for shortness of breath.   Cardiovascular: Negative for chest pain.  Gastrointestinal: Negative for vomiting, abdominal pain, constipation and bowel incontinence.  Genitourinary: Negative for bladder incontinence, dysuria, hematuria, flank pain, decreased urine volume, vaginal bleeding, difficulty urinating and pelvic pain.       No perineal numbness or incontinence of urine or feces  Musculoskeletal: Positive for back pain. Negative for joint swelling.  Skin: Negative for rash.  Neurological: Negative for dizziness, tingling, weakness, numbness and paresthesias.  All other systems reviewed and are negative.    Allergies  Omnicef; Codeine; Sulfa  antibiotics; Amoxicillin; and Penicillins  Home Medications   Current Outpatient Rx  Name Route Sig Dispense Refill  . CAMPHOR-MENTHOL-METHYL SAL 1.2-5.7-6.3 % EX PTCH Apply externally Apply 1 application topically as needed. For pain in legs    . CITALOPRAM HYDROBROMIDE 40 MG PO TABS Oral Take 40 mg by mouth every evening.     Marland Kitchen CLONAZEPAM 0.5 MG PO TABS Oral Take 0.5 mg by mouth 2 (two) times daily as needed. For anxiety    . GABAPENTIN 300 MG PO CAPS Oral Take 900 mg by mouth at bedtime.    Marland Kitchen  HYDROCODONE-ACETAMINOPHEN 7.5-650 MG PO TABS Oral Take 1 tablet by mouth every 6 (six) hours as needed. For pain    . IBUPROFEN 200 MG PO TABS Oral Take 800 mg by mouth every 6 (six) hours as needed. For pain    . PENTOSAN POLYSULFATE SODIUM 100 MG PO CAPS Oral Take 100 mg by mouth 2 (two) times daily.    Marland Kitchen PHENAZOPYRIDINE HCL 200 MG PO TABS Oral Take 200 mg by mouth 3 (three) times daily as needed. For urinary pain    . PROMETHAZINE HCL 25 MG PO TABS Oral Take 25-50 mg by mouth every 6 (six) hours as needed. For nausea    . SUMATRIPTAN SUCCINATE 100 MG PO TABS Oral Take 100 mg by mouth every 2 (two) hours as needed. For migraine pain    . ZOLPIDEM TARTRATE 10 MG PO TABS Oral Take 10 mg by mouth at bedtime as needed.      BP 141/102  Pulse 107  Temp 98.1 F (36.7 C)  Resp 20  Ht 5\' 7"  (1.702 m)  Wt 201 lb (91.173 kg)  BMI 31.48 kg/m2  SpO2 99%  LMP 08/04/2012  Physical Exam  Nursing note and vitals reviewed. Constitutional: She is oriented to person, place, and time. She appears well-developed and well-nourished. No distress.  HENT:  Head: Normocephalic and atraumatic.  Neck: Normal range of motion. Neck supple.  Cardiovascular: Normal rate, regular rhythm and intact distal pulses.   No murmur heard. Pulmonary/Chest: Effort normal and breath sounds normal.  Musculoskeletal: She exhibits tenderness. She exhibits no edema.       Lumbar back: She exhibits tenderness and pain. She exhibits normal range of motion, no swelling, no deformity, no laceration and normal pulse.       Back:       Tender to palpation over the left lumbar paraspinal muscles and left SI joint.  No abrasion, bruising, or edema.  Neurological: She is alert and oriented to person, place, and time. No cranial nerve deficit or sensory deficit. She exhibits normal muscle tone. Coordination and gait normal.  Reflex Scores:      Patellar reflexes are 2+ on the right side and 2+ on the left side.      Achilles reflexes  are 2+ on the right side and 2+ on the left side. Skin: Skin is warm and dry.    ED Course  Procedures (including critical care time)  Results for orders placed during the hospital encounter of 09/02/12  URINALYSIS, ROUTINE W REFLEX MICROSCOPIC      Component Value Range   Color, Urine YELLOW  YELLOW   APPearance CLEAR  CLEAR   Specific Gravity, Urine <1.005 (*) 1.005 - 1.030   pH 7.0  5.0 - 8.0   Glucose, UA NEGATIVE  NEGATIVE mg/dL   Hgb urine dipstick NEGATIVE  NEGATIVE   Bilirubin Urine NEGATIVE  NEGATIVE   Ketones,  ur NEGATIVE  NEGATIVE mg/dL   Protein, ur NEGATIVE  NEGATIVE mg/dL   Urobilinogen, UA 0.2  0.0 - 1.0 mg/dL   Nitrite NEGATIVE  NEGATIVE   Leukocytes, UA NEGATIVE  NEGATIVE  PREGNANCY, URINE      Component Value Range   Preg Test, Ur NEGATIVE  NEGATIVE      Dg Lumbar Spine Complete  09/02/2012  *RADIOLOGY REPORT*  Clinical Data: Back pain, left hip pain  LUMBAR SPINE - COMPLETE 4+ VIEW  Comparison: 06/03/2012  Findings: Five views of the lumbar spine submitted.  No acute fracture or subluxation. Alignment and vertebral height are preserved.  Mild disc space flattening at L5 S1 level.  IMPRESSION: No acute fracture or subluxation.  Mild disc space flattening at L5- S1 level.   Original Report Authenticated By: Natasha Mead, M.D.    Dg Hip Complete Left  09/02/2012  *RADIOLOGY REPORT*  Clinical Data: Back pain, left hip pain post fall  LEFT HIP - COMPLETE 2+ VIEW  Comparison: None.  Findings: Three views of the left hip submitted.  No acute fracture or subluxation. No radiopaque foreign body.  IMPRESSION: No acute fracture or subluxation.   Original Report Authenticated By: Natasha Mead, M.D.     MDM     Patient has ttp of the left lumbar paraspinal muscles and left lateral hip.  No focal neuro deficits on exam.  Ambulates with a steady gait.   Patient has a history of fibromyalgia and chronic back pain  X-ray results were reviewed by me and findings discussed with  the patient. I doubt emergent neurological process. Patient agrees to close followup with her primary care physician.  Prescribed: Flexeril   Maxey Ransom L. [place], [place] 09/03/12 1839

## 2012-09-02 NOTE — ED Notes (Signed)
Report to christy rn.

## 2012-09-04 NOTE — ED Provider Notes (Signed)
Medical screening examination/treatment/procedure(s) were performed by non-physician practitioner and as supervising physician I was immediately available for consultation/collaboration.   Gwyneth Sprout, MD 09/04/12 [phone]

## 2012-11-20 ENCOUNTER — Encounter (HOSPITAL_COMMUNITY): Payer: Self-pay | Admitting: Emergency Medicine

## 2012-11-20 ENCOUNTER — Emergency Department (HOSPITAL_COMMUNITY)
Admission: EM | Admit: 2012-11-20 | Discharge: 2012-11-21 | Disposition: A | Discharge status: Home / Self Care | Payer: Medicaid Other | Attending: Emergency Medicine | Admitting: Emergency Medicine

## 2012-11-20 DIAGNOSIS — Z79899 Other long term (current) drug therapy: Secondary | ICD-10-CM | POA: Insufficient documentation

## 2012-11-20 DIAGNOSIS — J069 Acute upper respiratory infection, unspecified: Secondary | ICD-10-CM

## 2012-11-20 DIAGNOSIS — Z8742 Personal history of other diseases of the female genital tract: Secondary | ICD-10-CM | POA: Insufficient documentation

## 2012-11-20 DIAGNOSIS — Z8639 Personal history of other endocrine, nutritional and metabolic disease: Secondary | ICD-10-CM | POA: Insufficient documentation

## 2012-11-20 DIAGNOSIS — Z862 Personal history of diseases of the blood and blood-forming organs and certain disorders involving the immune mechanism: Secondary | ICD-10-CM | POA: Insufficient documentation

## 2012-11-20 DIAGNOSIS — J3489 Other specified disorders of nose and nasal sinuses: Secondary | ICD-10-CM | POA: Insufficient documentation

## 2012-11-20 DIAGNOSIS — R079 Chest pain, unspecified: Secondary | ICD-10-CM | POA: Insufficient documentation

## 2012-11-20 DIAGNOSIS — H9209 Otalgia, unspecified ear: Secondary | ICD-10-CM | POA: Insufficient documentation

## 2012-11-20 DIAGNOSIS — M549 Dorsalgia, unspecified: Secondary | ICD-10-CM | POA: Insufficient documentation

## 2012-11-20 DIAGNOSIS — Z87442 Personal history of urinary calculi: Secondary | ICD-10-CM | POA: Insufficient documentation

## 2012-11-20 DIAGNOSIS — Z8719 Personal history of other diseases of the digestive system: Secondary | ICD-10-CM | POA: Insufficient documentation

## 2012-11-20 DIAGNOSIS — J40 Bronchitis, not specified as acute or chronic: Secondary | ICD-10-CM | POA: Insufficient documentation

## 2012-11-20 DIAGNOSIS — G8929 Other chronic pain: Secondary | ICD-10-CM | POA: Insufficient documentation

## 2012-11-20 DIAGNOSIS — R51 Headache: Secondary | ICD-10-CM | POA: Insufficient documentation

## 2012-11-20 DIAGNOSIS — F411 Generalized anxiety disorder: Secondary | ICD-10-CM | POA: Insufficient documentation

## 2012-11-20 DIAGNOSIS — R6883 Chills (without fever): Secondary | ICD-10-CM | POA: Insufficient documentation

## 2012-11-20 DIAGNOSIS — F17200 Nicotine dependence, unspecified, uncomplicated: Secondary | ICD-10-CM | POA: Insufficient documentation

## 2012-11-20 DIAGNOSIS — Z8739 Personal history of other diseases of the musculoskeletal system and connective tissue: Secondary | ICD-10-CM | POA: Insufficient documentation

## 2012-11-20 MED ORDER — HYDROCOD POLST-CHLORPHEN POLST 10-8 MG/5ML PO LQCR
5.0000 mL | Freq: Once | ORAL | Status: AC
  Administered 2012-11-20: 5 mL via ORAL
  Filled 2012-11-20: qty 5

## 2012-11-20 MED ORDER — PREDNISONE 50 MG PO TABS
60.0000 mg | ORAL_TABLET | Freq: Once | ORAL | Status: AC
  Administered 2012-11-20: 60 mg via ORAL
  Filled 2012-11-20: qty 1

## 2012-11-20 NOTE — ED Provider Notes (Signed)
History     CSN: [id number]  Arrival date & time 11/20/12  2233   First MD Initiated Contact with Patient 11/20/12 2300      Chief Complaint  Patient presents with  . Cough  . Otalgia  . congestion     (Consider location/radiation/quality/duration/timing/severity/associated sxs/prior treatment) Patient is a 32 y.o. female presenting with cough and ear pain. The history is provided by the patient.  Cough This is a new problem. The current episode started more than 2 days ago. The problem occurs hourly. The problem has been gradually worsening. The cough is non-productive. The maximum temperature recorded prior to her arrival was 100 to 100.9 F. Associated symptoms include chest pain, chills, ear pain and headaches. Pertinent negatives include no shortness of breath and no wheezing. She has tried cough syrup for the symptoms. The treatment provided no relief. She is a smoker. Her past medical history is significant for bronchitis.  Otalgia Associated symptoms include headaches and cough. Pertinent negatives include no abdominal pain and no neck pain.    Past Medical History  Diagnosis Date  . Anxiety   . Fibromyalgia   . Restless leg   . Chronic back pain   . GERD (gastroesophageal reflux disease)   . Headache     mygrains  . PONV (postoperative nausea and vomiting)   . Interstitial cystitis   . Kidney stones     interstitual cystitis  . Nephrolithiasis   . Polycystic ovarian syndrome     Past Surgical History  Procedure Date  . Cholecystectomy   . Tonsillectomy   . Tubal ligation   . Tympanoplasty     Family History  Problem Relation Age of Onset  . Hypertension Father   . Diabetes Father   . Cancer Other   . Diabetes Other     History  Substance Use Topics  . Smoking status: Current Every Day Smoker -- 1.0 packs/day for 15 years    Types: Cigarettes  . Smokeless tobacco: Never Used  . Alcohol Use: No    OB History    Grav Para Term Preterm Abortions  TAB SAB Ect Mult Living   2 2  2      2       Review of Systems  Constitutional: Positive for chills. Negative for activity change.       All ROS Neg except as noted in HPI  HENT: Positive for ear pain. Negative for nosebleeds and neck pain.   Eyes: Negative for photophobia and discharge.  Respiratory: Positive for cough. Negative for shortness of breath and wheezing.   Cardiovascular: Positive for chest pain. Negative for palpitations.  Gastrointestinal: Negative for abdominal pain and blood in stool.  Genitourinary: Negative for dysuria, frequency and hematuria.  Musculoskeletal: Negative for back pain and arthralgias.  Skin: Negative.   Neurological: Positive for headaches. Negative for dizziness, seizures and speech difficulty.  Psychiatric/Behavioral: Negative for hallucinations and confusion.    Allergies  Omnicef; Codeine; Sulfa antibiotics; Amoxicillin; and Penicillins  Home Medications   Current Outpatient Rx  Name  Route  Sig  Dispense  Refill  . CITALOPRAM HYDROBROMIDE 40 MG PO TABS   Oral   Take 40 mg by mouth every evening.          Marland Kitchen CLONAZEPAM 0.5 MG PO TABS   Oral   Take 0.5 mg by mouth daily as needed. For anxiety         . GABAPENTIN 300 MG PO CAPS   Oral  Take 900 mg by mouth at bedtime.         Marland Kitchen HYDROCODONE-ACETAMINOPHEN 7.5-650 MG PO TABS   Oral   Take 1 tablet by mouth every 6 (six) hours as needed. For pain         . IBUPROFEN 200 MG PO TABS   Oral   Take 800 mg by mouth every 6 (six) hours as needed. For pain         . PROMETHAZINE HCL 25 MG PO TABS   Oral   Take 25-50 mg by mouth every 6 (six) hours as needed. For nausea         . ZOLPIDEM TARTRATE 10 MG PO TABS   Oral   Take 10 mg by mouth at bedtime as needed. Sleep.           BP 126/96  Pulse 96  Temp 98.6 F (37 C) (Oral)  Resp 20  Ht 5\' 6"  (1.676 m)  Wt 195 lb (88.451 kg)  BMI 31.47 kg/m2  LMP 11/06/2012  Physical Exam  Nursing note and vitals  reviewed. Constitutional: She is oriented to person, place, and time. She appears well-developed and well-nourished.  Non-toxic appearance.  HENT:  Head: Normocephalic.  Right Ear: Tympanic membrane normal.  Left Ear: Tympanic membrane normal.       Nasal congestion Scarring of TM for previous TM ruptures.  Eyes: EOM and lids are normal. Pupils are equal, round, and reactive to light.  Neck: Normal range of motion. Neck supple. Carotid bruit is not present.  Cardiovascular: Normal rate, regular rhythm, normal heart sounds, intact distal pulses and normal pulses.   Pulmonary/Chest: Breath sounds normal. No respiratory distress.       Anterior and lateral chest wall soreness. Rhonchi present.  Abdominal: Soft. Bowel sounds are normal. There is no tenderness. There is no guarding.  Musculoskeletal: Normal range of motion.  Lymphadenopathy:       Head (right side): No submandibular adenopathy present.       Head (left side): No submandibular adenopathy present.    She has no cervical adenopathy.  Neurological: She is alert and oriented to person, place, and time. She has normal strength. No cranial nerve deficit or sensory deficit.  Skin: Skin is warm and dry.  Psychiatric: She has a normal mood and affect. Her speech is normal.    ED Course  Procedures (including critical care time)  Labs Reviewed - No data to display No results found.   No diagnosis found.    MDM  I have reviewed nursing notes, vital signs, and all appropriate lab and imaging results for this patient. Patient presents to the emergency department with 3-4 days of ear pain, congestion, and chest burning with cough. She also reports some body aches. She noted temperature of 101 on one of the days that she's been sick. The examination is consistent with upper respiratory infection. The patient will be treated with promethazine DM cough medication, Sudafed and, and ibuprofen 800 mg. Patient advised to increase fluids  and wash hands frequently.       Kathie Dike, [place] 11/21/12 0002

## 2012-11-20 NOTE — ED Notes (Signed)
Pt c/o cough, congestion, chest burning and bilateral ear pain x 3 days.

## 2012-11-21 MED ORDER — PSEUDOEPHEDRINE HCL 60 MG PO TABS: ORAL_TABLET | ORAL | Status: DC

## 2012-11-21 MED ORDER — PROMETHAZINE-DM 6.25-15 MG/5ML PO SYRP: 5.0000 mL | ORAL_SOLUTION | Freq: Four times a day (QID) | ORAL | Status: DC | PRN

## 2012-11-21 MED ORDER — IBUPROFEN 800 MG PO TABS: 800.0000 mg | ORAL_TABLET | Freq: Three times a day (TID) | ORAL | Status: DC

## 2012-11-21 NOTE — ED Provider Notes (Signed)
Medical screening examination/treatment/procedure(s) were performed by non-physician practitioner and as supervising physician I was immediately available for consultation/collaboration.  Nicoletta Dress. [place], MD 11/21/12 [phone]

## 2012-11-26 ENCOUNTER — Emergency Department (HOSPITAL_COMMUNITY): Payer: Medicaid Other

## 2012-11-26 ENCOUNTER — Emergency Department (HOSPITAL_COMMUNITY)
Admission: EM | Admit: 2012-11-26 | Discharge: 2012-11-26 | Disposition: A | Discharge status: Home / Self Care | Payer: Medicaid Other | Attending: Emergency Medicine | Admitting: Emergency Medicine

## 2012-11-26 ENCOUNTER — Encounter (HOSPITAL_COMMUNITY): Payer: Self-pay | Admitting: Emergency Medicine

## 2012-11-26 DIAGNOSIS — Z791 Long term (current) use of non-steroidal anti-inflammatories (NSAID): Secondary | ICD-10-CM | POA: Insufficient documentation

## 2012-11-26 DIAGNOSIS — Z8701 Personal history of pneumonia (recurrent): Secondary | ICD-10-CM | POA: Insufficient documentation

## 2012-11-26 DIAGNOSIS — F411 Generalized anxiety disorder: Secondary | ICD-10-CM | POA: Insufficient documentation

## 2012-11-26 DIAGNOSIS — G8929 Other chronic pain: Secondary | ICD-10-CM | POA: Insufficient documentation

## 2012-11-26 DIAGNOSIS — Z8679 Personal history of other diseases of the circulatory system: Secondary | ICD-10-CM | POA: Insufficient documentation

## 2012-11-26 DIAGNOSIS — J111 Influenza due to unidentified influenza virus with other respiratory manifestations: Secondary | ICD-10-CM

## 2012-11-26 DIAGNOSIS — J3489 Other specified disorders of nose and nasal sinuses: Secondary | ICD-10-CM | POA: Insufficient documentation

## 2012-11-26 DIAGNOSIS — Z87442 Personal history of urinary calculi: Secondary | ICD-10-CM | POA: Insufficient documentation

## 2012-11-26 DIAGNOSIS — M2550 Pain in unspecified joint: Secondary | ICD-10-CM | POA: Insufficient documentation

## 2012-11-26 DIAGNOSIS — F17200 Nicotine dependence, unspecified, uncomplicated: Secondary | ICD-10-CM | POA: Insufficient documentation

## 2012-11-26 DIAGNOSIS — K219 Gastro-esophageal reflux disease without esophagitis: Secondary | ICD-10-CM | POA: Insufficient documentation

## 2012-11-26 DIAGNOSIS — G2581 Restless legs syndrome: Secondary | ICD-10-CM | POA: Insufficient documentation

## 2012-11-26 DIAGNOSIS — Z79899 Other long term (current) drug therapy: Secondary | ICD-10-CM | POA: Insufficient documentation

## 2012-11-26 DIAGNOSIS — R509 Fever, unspecified: Secondary | ICD-10-CM | POA: Insufficient documentation

## 2012-11-26 DIAGNOSIS — IMO0001 Reserved for inherently not codable concepts without codable children: Secondary | ICD-10-CM | POA: Insufficient documentation

## 2012-11-26 DIAGNOSIS — E282 Polycystic ovarian syndrome: Secondary | ICD-10-CM | POA: Insufficient documentation

## 2012-11-26 MED ORDER — PROMETHAZINE-DM 6.25-15 MG/5ML PO SYRP: ORAL_SOLUTION | ORAL | Status: DC

## 2012-11-26 MED ORDER — IBUPROFEN 800 MG PO TABS
800.0000 mg | ORAL_TABLET | Freq: Once | ORAL | Status: AC
  Administered 2012-11-26: 800 mg via ORAL
  Filled 2012-11-26: qty 1

## 2012-11-26 MED ORDER — HYDROCOD POLST-CHLORPHEN POLST 10-8 MG/5ML PO LQCR
5.0000 mL | Freq: Once | ORAL | Status: AC
  Administered 2012-11-26: 5 mL via ORAL
  Filled 2012-11-26: qty 5

## 2012-11-26 MED ORDER — ALBUTEROL SULFATE HFA 108 (90 BASE) MCG/ACT IN AERS
2.0000 | INHALATION_SPRAY | RESPIRATORY_TRACT | Status: DC
  Administered 2012-11-26: 2 via RESPIRATORY_TRACT
  Filled 2012-11-26: qty 6.7

## 2012-11-26 MED ORDER — HYDROCOD POLST-CHLORPHEN POLST 10-8 MG/5ML PO LQCR: 5.0000 mL | Freq: Two times a day (BID) | ORAL | Status: DC | PRN

## 2012-11-26 MED ORDER — PREDNISONE 10 MG PO TABS: ORAL_TABLET | ORAL | Status: DC

## 2012-11-26 NOTE — ED Provider Notes (Signed)
History     CSN: [id number]  Arrival date & time 11/26/12  1006   First MD Initiated Contact with Patient 11/26/12 1011      Chief Complaint  Patient presents with  . Cough  . Fever    (Consider location/radiation/quality/duration/timing/severity/associated sxs/prior treatment) Patient is a 32 y.o. female presenting with cough and fever. The history is provided by the patient.  Cough This is a recurrent problem. The current episode started more than 1 week ago. The problem occurs every few minutes. The problem has been gradually worsening. The cough is productive of sputum. There has been no fever. Associated symptoms include chills, rhinorrhea and myalgias. Pertinent negatives include no chest pain, no shortness of breath and no wheezing. She has tried decongestants and cough syrup for the symptoms. The treatment provided no relief. She is a smoker. Her past medical history is significant for pneumonia. Her past medical history does not include emphysema.  Fever Primary symptoms of the febrile illness include fever, cough, myalgias and arthralgias. Primary symptoms do not include wheezing, shortness of breath, abdominal pain or dysuria.    Past Medical History  Diagnosis Date  . Anxiety   . Fibromyalgia   . Restless leg   . Chronic back pain   . GERD (gastroesophageal reflux disease)   . Headache     mygrains  . PONV (postoperative nausea and vomiting)   . Interstitial cystitis   . Kidney stones     interstitual cystitis  . Nephrolithiasis   . Polycystic ovarian syndrome     Past Surgical History  Procedure Date  . Cholecystectomy   . Tonsillectomy   . Tubal ligation   . Tympanoplasty     Family History  Problem Relation Age of Onset  . Hypertension Father   . Diabetes Father   . Cancer Other   . Diabetes Other     History  Substance Use Topics  . Smoking status: Current Every Day Smoker -- 1.0 packs/day for 15 years    Types: Cigarettes  . Smokeless  tobacco: Never Used  . Alcohol Use: No    OB History    Grav Para Term Preterm Abortions TAB SAB Ect Mult Living   2 2  2      2       Review of Systems  Constitutional: Positive for fever and chills. Negative for activity change.       All ROS Neg except as noted in HPI  HENT: Positive for rhinorrhea. Negative for nosebleeds and neck pain.   Eyes: Negative for photophobia and discharge.  Respiratory: Positive for cough. Negative for shortness of breath and wheezing.   Cardiovascular: Negative for chest pain and palpitations.  Gastrointestinal: Negative for abdominal pain and blood in stool.  Genitourinary: Negative for dysuria, frequency and hematuria.  Musculoskeletal: Positive for myalgias and arthralgias. Negative for back pain.  Skin: Negative.   Neurological: Negative for dizziness, seizures and speech difficulty.  Psychiatric/Behavioral: Negative for hallucinations and confusion.    Allergies  Omnicef; Codeine; Sulfa antibiotics; Amoxicillin; and Penicillins  Home Medications   Current Outpatient Rx  Name  Route  Sig  Dispense  Refill  . CARISOPRODOL 350 MG PO TABS   Oral   Take 350 mg by mouth 1 day or 1 dose.         Marland Kitchen HYDROXYZINE HCL 10 MG PO TABS   Oral   Take 10 mg by mouth at bedtime.         Marland Kitchen  PANTOPRAZOLE SODIUM 40 MG PO TBEC   Oral   Take 40 mg by mouth daily.         Marland Kitchen VALACYCLOVIR HCL 1 G PO TABS   Oral   Take 1,000 mg by mouth 3 (three) times daily as needed.         Marland Kitchen CITALOPRAM HYDROBROMIDE 40 MG PO TABS   Oral   Take 40 mg by mouth every evening.          Marland Kitchen CLONAZEPAM 0.5 MG PO TABS   Oral   Take 0.5 mg by mouth daily as needed. For anxiety         . GABAPENTIN 300 MG PO CAPS   Oral   Take 600 mg by mouth at bedtime.          Marland Kitchen HYDROCODONE-ACETAMINOPHEN 7.5-650 MG PO TABS   Oral   Take 1 tablet by mouth 3 (three) times daily. Fibromyalgia         . IBUPROFEN 200 MG PO TABS   Oral   Take 800 mg by mouth every 6  (six) hours as needed. For pain         . IBUPROFEN 800 MG PO TABS   Oral   Take 1 tablet (800 mg total) by mouth 3 (three) times daily.   21 tablet   0   . PROMETHAZINE HCL 25 MG PO TABS   Oral   Take 25-50 mg by mouth every 6 (six) hours as needed. For nausea         . PROMETHAZINE-DM 6.25-15 MG/5ML PO SYRP   Oral   Take 5 mLs by mouth 4 (four) times daily as needed for cough.   150 mL   0   . PSEUDOEPHEDRINE HCL 60 MG PO TABS      1 po tid for congestion   30 tablet   0   . ZOLPIDEM TARTRATE 10 MG PO TABS   Oral   Take 10 mg by mouth at bedtime as needed. Sleep.           BP 144/100  Pulse 94  Temp 98.3 F (36.8 C) (Oral)  Resp 20  SpO2 99%  LMP 11/26/2012  Physical Exam  Nursing note and vitals reviewed. Constitutional: She is oriented to person, place, and time. She appears well-developed and well-nourished.  Non-toxic appearance.  HENT:  Head: Normocephalic.  Right Ear: Tympanic membrane and external ear normal.  Left Ear: Tympanic membrane and external ear normal.       Nasal congestion present.  Eyes: EOM and lids are normal. Pupils are equal, round, and reactive to light.  Neck: Normal range of motion. Neck supple. Carotid bruit is not present.  Cardiovascular: Normal rate, regular rhythm, normal heart sounds, intact distal pulses and normal pulses.   Pulmonary/Chest: Breath sounds normal. No respiratory distress.       Few scattered rhonchi wth course breath sounds.  Abdominal: Soft. Bowel sounds are normal. There is no tenderness. There is no guarding.  Musculoskeletal: Normal range of motion.  Lymphadenopathy:       Head (right side): No submandibular adenopathy present.       Head (left side): No submandibular adenopathy present.    She has no cervical adenopathy.  Neurological: She is alert and oriented to person, place, and time. She has normal strength. No cranial nerve deficit or sensory deficit.  Skin: Skin is warm and dry. No rash  noted.  Psychiatric: She has a normal mood  and affect. Her speech is normal.    ED Course  Procedures (including critical care time)  Labs Reviewed - No data to display No results found. Pulse ox 99% on room air. WNL by my interpretation.  No diagnosis found.    MDM  I have reviewed nursing notes, vital signs, and all appropriate lab and imaging results for this patient. Chest xray is negative for pneumonia or acute problem. Pt feeling some better after tussionex and ibuprofen. Rx for Ibuprofen, tussionex, deltasone given to the patient. She is to return if any changes or problem.Kathie Dike, PA 12/04/12 1430

## 2012-11-26 NOTE — ED Notes (Signed)
States was recently diagnosed with URI, not improving since then.  States she continues with fever, cough and chest congestion.

## 2012-12-05 NOTE — ED Provider Notes (Signed)
Medical screening examination/treatment/procedure(s) were performed by non-physician practitioner and as supervising physician I was immediately available for consultation/collaboration.  Jarman Litton, MD 12/05/12 1643 

## 2012-12-17 ENCOUNTER — Emergency Department (HOSPITAL_COMMUNITY): Payer: Medicaid Other

## 2012-12-17 ENCOUNTER — Encounter (HOSPITAL_COMMUNITY): Payer: Self-pay | Admitting: Emergency Medicine

## 2012-12-17 ENCOUNTER — Emergency Department (HOSPITAL_COMMUNITY)
Admission: EM | Admit: 2012-12-17 | Discharge: 2012-12-17 | Disposition: A | Discharge status: Home / Self Care | Payer: Medicaid Other | Attending: Emergency Medicine | Admitting: Emergency Medicine

## 2012-12-17 DIAGNOSIS — G8929 Other chronic pain: Secondary | ICD-10-CM | POA: Insufficient documentation

## 2012-12-17 DIAGNOSIS — Z8742 Personal history of other diseases of the female genital tract: Secondary | ICD-10-CM | POA: Insufficient documentation

## 2012-12-17 DIAGNOSIS — R10A0 Flank pain, unspecified side: Secondary | ICD-10-CM

## 2012-12-17 DIAGNOSIS — Z79899 Other long term (current) drug therapy: Secondary | ICD-10-CM | POA: Insufficient documentation

## 2012-12-17 DIAGNOSIS — Z8679 Personal history of other diseases of the circulatory system: Secondary | ICD-10-CM | POA: Insufficient documentation

## 2012-12-17 DIAGNOSIS — Z8639 Personal history of other endocrine, nutritional and metabolic disease: Secondary | ICD-10-CM | POA: Insufficient documentation

## 2012-12-17 DIAGNOSIS — F411 Generalized anxiety disorder: Secondary | ICD-10-CM | POA: Insufficient documentation

## 2012-12-17 DIAGNOSIS — Z862 Personal history of diseases of the blood and blood-forming organs and certain disorders involving the immune mechanism: Secondary | ICD-10-CM | POA: Insufficient documentation

## 2012-12-17 DIAGNOSIS — R109 Unspecified abdominal pain: Secondary | ICD-10-CM | POA: Insufficient documentation

## 2012-12-17 DIAGNOSIS — Z87448 Personal history of other diseases of urinary system: Secondary | ICD-10-CM | POA: Insufficient documentation

## 2012-12-17 DIAGNOSIS — F17200 Nicotine dependence, unspecified, uncomplicated: Secondary | ICD-10-CM | POA: Insufficient documentation

## 2012-12-17 DIAGNOSIS — R112 Nausea with vomiting, unspecified: Secondary | ICD-10-CM | POA: Insufficient documentation

## 2012-12-17 DIAGNOSIS — N200 Calculus of kidney: Secondary | ICD-10-CM | POA: Insufficient documentation

## 2012-12-17 DIAGNOSIS — K219 Gastro-esophageal reflux disease without esophagitis: Secondary | ICD-10-CM | POA: Insufficient documentation

## 2012-12-17 DIAGNOSIS — Z8669 Personal history of other diseases of the nervous system and sense organs: Secondary | ICD-10-CM | POA: Insufficient documentation

## 2012-12-17 LAB — BASIC METABOLIC PANEL
BUN: 7 mg/dL (ref 6–23)
Calcium: 10.1 mg/dL (ref 8.4–10.5)
Chloride: 93 mEq/L — ABNORMAL LOW (ref 96–112)
Creatinine, Ser: 0.72 mg/dL (ref 0.50–1.10)
GFR calc Af Amer: >90 mL/min (ref >90)
GFR calc non Af Amer: >90 mL/min (ref >90)

## 2012-12-17 LAB — CBC WITH DIFFERENTIAL/PLATELET
Basophils Relative: 1 % (ref 0–1)
Eosinophils Absolute: 0.5 10*3/uL (ref 0.0–0.7)
Eosinophils Relative: 5 % (ref 0–5)
HCT: 43.9 % (ref 36.0–46.0)
Hemoglobin: 15.2 g/dL — ABNORMAL HIGH (ref 12.0–15.0)
MCH: 29.4 pg (ref 26.0–34.0)
MCHC: 34.6 g/dL (ref 30.0–36.0)
MCV: 84.9 fL (ref 78.0–100.0)
Monocytes Absolute: 0.9 10*3/uL (ref 0.1–1.0)
Monocytes Relative: 9 % (ref 3–12)
RDW: 14.2 % (ref 11.5–15.5)

## 2012-12-17 LAB — URINE MICROSCOPIC-ADD ON

## 2012-12-17 LAB — URINALYSIS, ROUTINE W REFLEX MICROSCOPIC
Bilirubin Urine: NEGATIVE
Glucose, UA: NEGATIVE mg/dL
Ketones, ur: NEGATIVE mg/dL
Protein, ur: NEGATIVE mg/dL
pH: 5.5 (ref 5.0–8.0)

## 2012-12-17 MED ORDER — HYDROMORPHONE HCL PF 1 MG/ML IJ SOLN
1.0000 mg | Freq: Once | INTRAMUSCULAR | Status: AC
  Administered 2012-12-17: 1 mg via INTRAVENOUS
  Filled 2012-12-17: qty 1

## 2012-12-17 MED ORDER — KETOROLAC TROMETHAMINE 30 MG/ML IJ SOLN
30.0000 mg | Freq: Once | INTRAMUSCULAR | Status: AC
  Administered 2012-12-17: 30 mg via INTRAVENOUS
  Filled 2012-12-17: qty 1

## 2012-12-17 MED ORDER — OXYCODONE-ACETAMINOPHEN 5-325 MG PO TABS: 1.0000 | ORAL_TABLET | Freq: Four times a day (QID) | ORAL | Status: AC | PRN

## 2012-12-17 MED ORDER — CIPROFLOXACIN HCL 500 MG PO TABS: 500.0000 mg | ORAL_TABLET | Freq: Two times a day (BID) | ORAL | Status: DC

## 2012-12-17 MED ORDER — ONDANSETRON HCL 4 MG/2ML IJ SOLN
4.0000 mg | Freq: Once | INTRAMUSCULAR | Status: AC
  Administered 2012-12-17: 4 mg via INTRAVENOUS
  Filled 2012-12-17: qty 2

## 2012-12-17 NOTE — ED Notes (Signed)
Pt c/o L flank pain, dark conc urine. Emesis this am.

## 2012-12-17 NOTE — ED Provider Notes (Signed)
History    This chart was scribed for [name], MD, MD by Smitty Pluck, ED Scribe. The patient was seen in room APA05/[facility]05 and the patient's care was started at 1:29 PM.   CSN: [id number]  Arrival date & time 12/17/12  1232    Chief Complaint  Patient presents with  . Nephrolithiasis    Patient is a 32 y.o. female presenting with flank pain. The history is provided by the patient. No language interpreter was used.  Flank Pain This is a recurrent problem. The current episode started yesterday. The problem occurs constantly. The problem has not changed since onset.Pertinent negatives include no chest pain, no abdominal pain and no headaches. Nothing aggravates the symptoms. Nothing relieves the symptoms. She has tried nothing for the symptoms.   Isabella Buchanan is a 32 y.o. female who presents to the Emergency Department complaining of constant, moderate left flank pain onset 1 day ago. Pt reports having hx of kidney stones (4 in total). Her last kidney stone was 1 year ago. She reports sudden onset. She vomited once this AM. She denies fever and any other pain.  Past Medical History  Diagnosis Date  . Anxiety   . Fibromyalgia   . Restless leg   . Chronic back pain   . GERD (gastroesophageal reflux disease)   . Headache     mygrains  . PONV (postoperative nausea and vomiting)   . Interstitial cystitis   . Kidney stones     interstitual cystitis  . Nephrolithiasis   . Polycystic ovarian syndrome     Past Surgical History  Procedure Date  . Cholecystectomy   . Tonsillectomy   . Tubal ligation   . Tympanoplasty     Family History  Problem Relation Age of Onset  . Hypertension Father   . Diabetes Father   . Cancer Other   . Diabetes Other     History  Substance Use Topics  . Smoking status: Current Every Day Smoker -- 1.0 packs/day for 15 years    Types: Cigarettes  . Smokeless tobacco: Never Used  . Alcohol Use: No    OB History    Grav Para Term  Preterm Abortions TAB SAB Ect Mult Living   2 2  2      2       Review of Systems  Constitutional: Negative for fatigue.  HENT: Negative for congestion, sinus pressure and ear discharge.   Eyes: Negative for discharge.  Respiratory: Negative for cough.   Cardiovascular: Negative for chest pain.  Gastrointestinal: Positive for nausea and vomiting. Negative for abdominal pain and diarrhea.  Genitourinary: Positive for flank pain. Negative for frequency and hematuria.  Musculoskeletal: Negative for back pain.  Skin: Negative for rash.  Neurological: Negative for seizures and headaches.  Hematological: Negative.   Psychiatric/Behavioral: Negative for hallucinations.  All other systems reviewed and are negative.    Allergies  Omnicef; Codeine; Sulfa antibiotics; Amoxicillin; and Penicillins  Home Medications   Current Outpatient Rx  Name  Route  Sig  Dispense  Refill  . AMITRIPTYLINE HCL 25 MG PO TABS   Oral   Take 25 mg by mouth at bedtime.         Marland Kitchen CARISOPRODOL 350 MG PO TABS   Oral   Take 350 mg by mouth daily.          Marland Kitchen HYDROCOD POLST-CPM POLST ER 10-8 MG/5ML PO LQCR   Oral   Take 5 mLs by mouth every 12 (  twelve) hours as needed.   100 mL   1   . CLONAZEPAM 1 MG PO TABS   Oral   Take 1 mg by mouth daily as needed. For anxiety         . HYDROCODONE-ACETAMINOPHEN 7.5-650 MG PO TABS   Oral   Take 1 tablet by mouth 3 (three) times daily. Fibromyalgia         . IBUPROFEN 800 MG PO TABS   Oral   Take 1 tablet (800 mg total) by mouth 3 (three) times daily.   21 tablet   0   . PANTOPRAZOLE SODIUM 40 MG PO TBEC   Oral   Take 40 mg by mouth daily.         Marland Kitchen PROMETHAZINE HCL 25 MG PO TABS   Oral   Take 25-50 mg by mouth every 6 (six) hours as needed. For nausea         . VALACYCLOVIR HCL 1 G PO TABS   Oral   Take 1,000 mg by mouth 3 (three) times daily as needed. Cold Sores         . ZOLPIDEM TARTRATE 10 MG PO TABS   Oral   Take 10 mg by mouth  at bedtime as needed. Sleep.           BP 135/102  Pulse 101  Temp 98.7 F (37.1 C)  Resp 16  Ht 5\' 6"  (1.676 m)  Wt 213 lb (96.616 kg)  BMI 34.38 kg/m2  SpO2 97%  LMP 11/26/2012  Physical Exam  Nursing note and vitals reviewed. Constitutional: She is oriented to person, place, and time. She appears well-developed.  HENT:  Head: Normocephalic and atraumatic.  Eyes: Conjunctivae normal and EOM are normal. No scleral icterus.  Neck: Neck supple. No thyromegaly present.  Cardiovascular: Normal rate and regular rhythm.  Exam reveals no gallop and no friction rub.   No murmur heard. Pulmonary/Chest: No stridor. She has no wheezes. She has no rales. She exhibits no tenderness.  Abdominal: She exhibits no distension. There is no tenderness. There is no rebound.  Genitourinary:       Left flank tenderness   Musculoskeletal: Normal range of motion. She exhibits no edema.  Lymphadenopathy:    She has no cervical adenopathy.  Neurological: She is oriented to person, place, and time. Coordination normal.  Skin: No rash noted. No erythema.  Psychiatric: She has a normal mood and affect. Her behavior is normal.    ED Course  Procedures (including critical care time) DIAGNOSTIC STUDIES: Oxygen Saturation is 97% on room air, normal by my interpretation.    COORDINATION OF CARE: 1:31 PM Discussed ED treatment with pt and pt agrees.  Meds ordered this encounter  Medications  . amitriptyline (ELAVIL) 25 MG tablet    Sig: Take 25 mg by mouth at bedtime.  . clonazePAM (KLONOPIN) 1 MG tablet    Sig: Take 1 mg by mouth daily as needed. For anxiety  . ketorolac (TORADOL) 30 MG/ML injection 30 mg    Sig:   . ondansetron (ZOFRAN) injection 4 mg    Sig:   . HYDROmorphone (DILAUDID) injection 1 mg    Sig:    2:42 PM  Recheck: Discussed lab results with pt. Pt's symptoms have improved. Pt is ready for discharge.    Labs Reviewed  URINALYSIS, ROUTINE W REFLEX MICROSCOPIC - Abnormal;  Notable for the following:    Leukocytes, UA TRACE (*)     All other components  within normal limits  CBC WITH DIFFERENTIAL - Abnormal; Notable for the following:    RBC 5.17 (*)     Hemoglobin 15.2 (*)     All other components within normal limits  BASIC METABOLIC PANEL - Abnormal; Notable for the following:    Potassium 3.0 (*)     Chloride 93 (*)     Glucose, Bld 153 (*)     All other components within normal limits  URINE MICROSCOPIC-ADD ON - Abnormal; Notable for the following:    Squamous Epithelial / LPF MANY (*)     Bacteria, UA FEW (*)     All other components within normal limits  URINE CULTURE   Dg Abd 1 View  12/17/2012  *RADIOLOGY REPORT*  Clinical Data: Left sided nephrolithiasis.  ABDOMEN - 1 VIEW  Comparison: 06/03/2012 CT urogram.  Findings: Moderate stool overlies the right and left kidney.  This obscures visualization of any potential renal calculi.  There are further investigation desired, consider CT urogram.  Prior cholecystectomy.  Negative osseous structures.  No visible proximal or distal ureteral calculi. Nonobstructive gas pattern.  IMPRESSION: Moderate stool overlies the kidneys.  Difficult to confirm or exclude nephrolithiasis.   Original Report Authenticated By: Davonna Belling, M.D.      No diagnosis found.    MDM  The chart was scribed for me under my direct supervision.  I personally performed the history, physical, and medical decision making and all procedures in the evaluation of this patient.[name], MD 12/17/12 1452

## 2012-12-18 LAB — URINE CULTURE
Colony Count: NO GROWTH
Culture: NO GROWTH

## 2013-01-10 ENCOUNTER — Encounter (HOSPITAL_COMMUNITY): Payer: Self-pay | Admitting: *Deleted

## 2013-01-10 ENCOUNTER — Emergency Department (HOSPITAL_COMMUNITY)
Admission: EM | Admit: 2013-01-10 | Discharge: 2013-01-11 | Disposition: A | Discharge status: Home / Self Care | Payer: Medicaid Other | Source: Home / Self Care | Attending: Emergency Medicine | Admitting: Emergency Medicine

## 2013-01-10 DIAGNOSIS — IMO0001 Reserved for inherently not codable concepts without codable children: Secondary | ICD-10-CM | POA: Insufficient documentation

## 2013-01-10 DIAGNOSIS — G2581 Restless legs syndrome: Secondary | ICD-10-CM | POA: Insufficient documentation

## 2013-01-10 DIAGNOSIS — K219 Gastro-esophageal reflux disease without esophagitis: Secondary | ICD-10-CM | POA: Insufficient documentation

## 2013-01-10 DIAGNOSIS — Z8639 Personal history of other endocrine, nutritional and metabolic disease: Secondary | ICD-10-CM | POA: Insufficient documentation

## 2013-01-10 DIAGNOSIS — Z79899 Other long term (current) drug therapy: Secondary | ICD-10-CM | POA: Insufficient documentation

## 2013-01-10 DIAGNOSIS — G8929 Other chronic pain: Secondary | ICD-10-CM | POA: Insufficient documentation

## 2013-01-10 DIAGNOSIS — F411 Generalized anxiety disorder: Secondary | ICD-10-CM | POA: Insufficient documentation

## 2013-01-10 DIAGNOSIS — Z9889 Other specified postprocedural states: Secondary | ICD-10-CM | POA: Insufficient documentation

## 2013-01-10 DIAGNOSIS — Z87442 Personal history of urinary calculi: Secondary | ICD-10-CM | POA: Insufficient documentation

## 2013-01-10 DIAGNOSIS — Z8669 Personal history of other diseases of the nervous system and sense organs: Secondary | ICD-10-CM | POA: Insufficient documentation

## 2013-01-10 DIAGNOSIS — Z8739 Personal history of other diseases of the musculoskeletal system and connective tissue: Secondary | ICD-10-CM | POA: Insufficient documentation

## 2013-01-10 DIAGNOSIS — M797 Fibromyalgia: Secondary | ICD-10-CM

## 2013-01-10 DIAGNOSIS — Z8742 Personal history of other diseases of the female genital tract: Secondary | ICD-10-CM | POA: Insufficient documentation

## 2013-01-10 DIAGNOSIS — Z791 Long term (current) use of non-steroidal anti-inflammatories (NSAID): Secondary | ICD-10-CM | POA: Insufficient documentation

## 2013-01-10 DIAGNOSIS — F17200 Nicotine dependence, unspecified, uncomplicated: Secondary | ICD-10-CM | POA: Insufficient documentation

## 2013-01-10 DIAGNOSIS — Z862 Personal history of diseases of the blood and blood-forming organs and certain disorders involving the immune mechanism: Secondary | ICD-10-CM | POA: Insufficient documentation

## 2013-01-10 DIAGNOSIS — M79609 Pain in unspecified limb: Secondary | ICD-10-CM | POA: Insufficient documentation

## 2013-01-10 DIAGNOSIS — M7989 Other specified soft tissue disorders: Secondary | ICD-10-CM | POA: Insufficient documentation

## 2013-01-10 MED ORDER — HYDROMORPHONE HCL PF 1 MG/ML IJ SOLN
0.5000 mg | Freq: Once | INTRAMUSCULAR | Status: DC
  Filled 2013-01-10: qty 1

## 2013-01-10 NOTE — ED Provider Notes (Signed)
History    This chart was scribed for Jones Skene, MD by Gerlean Ren, ED Scribe. This patient was seen in room APA19/APA19 and the patient's care was started at 11:40 PM    CSN: [id number]  Arrival date & time 01/10/13  2235   First MD Initiated Contact with Patient 01/10/13 2259      Chief Complaint  Patient presents with  . Back Pain    The history is provided by the patient. No language interpreter was used.  Isabella Buchanan is a 32 y.o. female who presents to the Emergency Department complaining of constant, aching lower back pain rated as 8-9/10 first noticed this morning and gradually worsening throughout the day that feels similar in type and severity to h/o nephrolithiasis.  Pt denies fever, chills, unexpected weight loss, numbness or tingling, dyspnea, cough, emesis, nausea, vaginal discharge, vaginal pain.  No falls, injuries, or traumas.  Pt has IBS but denies any recent changes in chronic diarrhea/constipation.  Pt reports she has been retaining fluid and has occasional pain with urinating, attributed to interstitial cystitis. Denies any urinary or fecal incontinence, denies urinary retention or saddle anesthesia. Pt also c/o pain in bilateral posterior lower extremities attributed to h/o fibromyalgias that has been worsened since onset of current back pain but is not a result of pain radiating from back.  Pt reports 5 year h/o occasional chest pain that is secondary to anxiety and has not recently worsened or changed in any way.  LNMP 11/26/2012, but pt states she has had a tubal ligation and that her menstrual cycle is irregular at baseline.  Pt has used [employer] around 7:00 PM with no improvements.  Past Medical History  Diagnosis Date  . Anxiety   . Fibromyalgia   . Restless leg   . Chronic back pain   . GERD (gastroesophageal reflux disease)   . Headache     mygrains  . PONV (postoperative nausea and vomiting)   . Interstitial cystitis   . Polycystic ovarian  syndrome   . Kidney stones     interstitual cystitis  . Nephrolithiasis     Past Surgical History  Procedure Laterality Date  . Cholecystectomy    . Tonsillectomy    . Tubal ligation    . Tympanoplasty      Family History  Problem Relation Age of Onset  . Hypertension Father   . Diabetes Father   . Cancer Other   . Diabetes Other     History  Substance Use Topics  . Smoking status: Current Every Day Smoker -- 1.00 packs/day for 15 years    Types: Cigarettes  . Smokeless tobacco: Never Used  . Alcohol Use: No    OB History   Grav Para Term Preterm Abortions TAB SAB Ect Mult Living   2 2  2      2       Review of Systems  Constitutional: Negative for fever, chills and unexpected weight change.  Respiratory: Negative for cough and shortness of breath.   Cardiovascular: Positive for leg swelling. Negative for chest pain.  Gastrointestinal: Negative for nausea, vomiting, abdominal pain and diarrhea.  Genitourinary: Negative for flank pain, vaginal discharge, vaginal pain and menstrual problem.  Musculoskeletal: Positive for back pain.  Neurological: Negative for numbness.  All other systems reviewed and are negative.    Allergies  Omnicef; Codeine; Sulfa antibiotics; Amoxicillin; and Penicillins  Home Medications   Current Outpatient Rx  Name  Route  Sig  Dispense  Refill  . amitriptyline (ELAVIL) 25 MG tablet   Oral   Take 25 mg by mouth at bedtime.         . carisoprodol (SOMA) 350 MG tablet   Oral   Take 350 mg by mouth daily.          . clonazePAM (KLONOPIN) 1 MG tablet   Oral   Take 1 mg by mouth daily as needed. For anxiety         . diphenhydrAMINE (BENADRYL) 25 MG tablet   Oral   Take 50 mg by mouth daily as needed for allergies.         . hydrocodone-acetaminophen (LORCET PLUS) 7.5-650 MG per tablet   Oral   Take 1 tablet by mouth 3 (three) times daily. Fibromyalgia         . ibuprofen (ADVIL,MOTRIN) 800 MG tablet   Oral   Take  1 tablet (800 mg total) by mouth 3 (three) times daily.   21 tablet   0   . pantoprazole (PROTONIX) 40 MG tablet   Oral   Take 40 mg by mouth daily.         . promethazine (PHENERGAN) 25 MG tablet   Oral   Take 25-50 mg by mouth every 6 (six) hours as needed. For nausea         . valACYclovir (VALTREX) 1000 MG tablet   Oral   Take 1,000 mg by mouth 3 (three) times daily as needed. Cold Sores         . zolpidem (AMBIEN) 10 MG tablet   Oral   Take 10 mg by mouth at bedtime as needed. Sleep.           BP 120/82  Pulse 91  Temp(Src) 98.7 F (37.1 C) (Oral)  Resp 20  Ht 5\' 7"  (1.702 m)  Wt 235 lb (106.595 kg)  BMI 36.8 kg/m2  SpO2 98%  LMP 11/26/2012  Physical Exam  Nursing notes reviewed.  Electronic medical record reviewed. VITAL SIGNS:   Filed Vitals:   01/10/13 2242 01/11/13 0104  BP: 120/82 120/67  Pulse: 91 74  Temp: 98.7 F (37.1 C)   TempSrc: Oral   Resp: 20 20  Height: 5\' 7"  (1.702 m)   Weight: 235 lb (106.595 kg)   SpO2: 98% 96%   CONSTITUTIONAL: Awake, oriented, appears non-toxic HENT: Atraumatic, normocephalic, oral mucosa pink and moist, airway patent. Nares patent without drainage. External ears normal. EYES: Conjunctiva clear, EOMI, PERRLA NECK: Trachea midline, non-tender, supple CARDIOVASCULAR: Normal heart rate, Normal rhythm, No murmurs, rubs, gallops PULMONARY/CHEST: Clear to auscultation, no rhonchi, wheezes, or rales. Symmetrical breath sounds. Non-tender. ABDOMINAL: Non-distended, soft, non-tender - no rebound or guarding.  BS normal. Back: mild tenderness to palpation NEUROLOGIC: Non-focal, moving all four extremities, no gross sensory or motor deficits. 2+ patellar reflexes bilaterally, 1+ Achilles reflexes bilaterally no clonus. EXTREMITIES: No clubbing, cyanosis, or edema SKIN: Warm, Dry, No erythema, No rash  ED Course  Procedures (including critical care time) DIAGNOSTIC STUDIES: Oxygen Saturation is 98% on room air,  normal by my interpretation.    COORDINATION OF CARE: 11:48 PM- Patient informed of clinical course, understands medical decision-making process, and agrees with plan.  Labs Reviewed  URINALYSIS, ROUTINE W REFLEX MICROSCOPIC   No results found.   1. Fibromyalgia muscle pain   2. Chronic pain       MDM  Isabella Buchanan is a 32 y.o. female With history of chronic pain, fibromyalgia which are  taken soma today and is mildly somnolent presents for back pain. I do not think this back pain represents an acute problem nor do I think it represents an epidural abscess or any other cord compression syndrome or cauda equina syndrome. Patient is nontoxic and afebrile.  No physical exam findings suggestive of neurologic compromise.  I do not think imaging or labs are indicated at this time  Give the patient some treatment for pain in the ER.  Patient's pain is better after treatment, she'll be discharged home stable and in good condition. Do not think she needs any further medications at home for her chronic pain.  I personally performed the services described in this documentation, which was scribed in my presence. The recorded information has been reviewed and is accurate. Jones Skene, M.D.      Jones Skene, MD 01/13/13 1610

## 2013-01-10 NOTE — ED Notes (Signed)
Lt side back pain with radiation down lt  leg

## 2013-01-11 ENCOUNTER — Encounter (HOSPITAL_COMMUNITY): Payer: Self-pay | Admitting: *Deleted

## 2013-01-11 ENCOUNTER — Emergency Department (HOSPITAL_COMMUNITY)
Admission: EM | Admit: 2013-01-11 | Discharge: 2013-01-12 | Disposition: A | Discharge status: Home / Self Care | Payer: Medicaid Other | Attending: Emergency Medicine | Admitting: Emergency Medicine

## 2013-01-11 DIAGNOSIS — M797 Fibromyalgia: Secondary | ICD-10-CM

## 2013-01-11 LAB — URINALYSIS, ROUTINE W REFLEX MICROSCOPIC
Glucose, UA: NEGATIVE mg/dL
Ketones, ur: NEGATIVE mg/dL
Leukocytes, UA: NEGATIVE
Protein, ur: NEGATIVE mg/dL

## 2013-01-11 MED ORDER — HYDROMORPHONE HCL PF 1 MG/ML IJ SOLN
0.5000 mg | Freq: Once | INTRAMUSCULAR | Status: AC
  Administered 2013-01-11: 0.5 mg via INTRAMUSCULAR

## 2013-01-11 MED ORDER — HYDROMORPHONE HCL PF 1 MG/ML IJ SOLN
0.5000 mg | Freq: Once | INTRAMUSCULAR | Status: AC
  Administered 2013-01-11: 0.5 mg via INTRAMUSCULAR
  Filled 2013-01-11: qty 1

## 2013-01-11 NOTE — ED Notes (Signed)
Pt states she was seen here yesterday for a fibromyalgia flare up and is here tonight for the same. Pt c/o back and leg pain.

## 2013-01-12 MED ORDER — HYDROMORPHONE HCL PF 1 MG/ML IJ SOLN
1.0000 mg | Freq: Once | INTRAMUSCULAR | Status: AC
  Administered 2013-01-12: 1 mg via INTRAMUSCULAR
  Filled 2013-01-12: qty 1

## 2013-01-12 NOTE — ED Provider Notes (Signed)
Medical screening examination/treatment/procedure(s) were performed by non-physician practitioner and as supervising physician I was immediately available for consultation/collaboration.   Joya Gaskins, MD 01/12/13 [id number]

## 2013-01-12 NOTE — ED Provider Notes (Signed)
History     CSN: [id number]  Arrival date & time 01/11/13  2350   First MD Initiated Contact with Patient 01/12/13 0015      Chief Complaint  Patient presents with  . Back Pain  . Leg Pain    (Consider location/radiation/quality/duration/timing/severity/associated sxs/prior treatment) HPI Comments: Isabella Buchanan is a 32 y.o. female who presenting with complaint of  constant, aching and buring lower back pain, and upper thighs and bilateral calves  starting yesterday morning and gradually worsening throughout the day that feels similar to previous flare up of her fibromyalgia. Pt denies fever, chills, unexpected weight loss, numbness or tingling, incontinence, hematuria  dyspnea, cough, emesis and nausea.  She has had no  Injuries.  Pt has IBS but denies any recent changes in chronic diarrhea/constipation. LNMP 11/26/2012, but pt states she has had a tubal ligation and that her menstrual cycle is irregular at baseline.  Pt has used [employer] around 7:00 PM with no improvements and is also taking hydrocodone and amitriptyline which she has been trying for the past month, but is not working for her.    Patient is a 32 y.o. female presenting with back pain and leg pain. The history is provided by the patient.  Back Pain Associated symptoms: leg pain   Associated symptoms: no abdominal pain, no chest pain, no fever, no headaches, no numbness and no weakness   Leg Pain Associated symptoms: back pain   Associated symptoms: no fever and no neck pain     Past Medical History  Diagnosis Date  . Anxiety   . Fibromyalgia   . Restless leg   . Chronic back pain   . GERD (gastroesophageal reflux disease)   . Headache     mygrains  . PONV (postoperative nausea and vomiting)   . Interstitial cystitis   . Polycystic ovarian syndrome   . Kidney stones     interstitual cystitis  . Nephrolithiasis     Past Surgical History  Procedure Laterality Date  . Cholecystectomy    .  Tonsillectomy    . Tubal ligation    . Tympanoplasty      Family History  Problem Relation Age of Onset  . Hypertension Father   . Diabetes Father   . Cancer Other   . Diabetes Other     History  Substance Use Topics  . Smoking status: Current Every Day Smoker -- 1.00 packs/day for 15 years    Types: Cigarettes  . Smokeless tobacco: Never Used  . Alcohol Use: No    OB History   Grav Para Term Preterm Abortions TAB SAB Ect Mult Living   2 2  2      2       Review of Systems  Constitutional: Negative for fever and chills.  HENT: Negative for congestion, sore throat and neck pain.   Eyes: Negative.   Respiratory: Negative for chest tightness and shortness of breath.   Cardiovascular: Negative for chest pain.  Gastrointestinal: Negative for nausea and abdominal pain.  Genitourinary: Negative.  Negative for hematuria, flank pain, difficulty urinating and vaginal pain.  Musculoskeletal: Positive for myalgias, back pain and arthralgias. Negative for joint swelling.  Skin: Negative.  Negative for rash and wound.  Neurological: Negative for dizziness, weakness, light-headedness, numbness and headaches.  Psychiatric/Behavioral: Negative.     Allergies  Omnicef; Codeine; Sulfa antibiotics; Amoxicillin; and Penicillins  Home Medications   Current Outpatient Rx  Name  Route  Sig  Dispense  Refill  .  amitriptyline (ELAVIL) 25 MG tablet   Oral   Take 25 mg by mouth at bedtime.         . carisoprodol (SOMA) 350 MG tablet   Oral   Take 350 mg by mouth daily.          . clonazePAM (KLONOPIN) 1 MG tablet   Oral   Take 1 mg by mouth daily as needed. For anxiety         . diphenhydrAMINE (BENADRYL) 25 MG tablet   Oral   Take 50 mg by mouth daily as needed for allergies.         . hydrocodone-acetaminophen (LORCET PLUS) 7.5-650 MG per tablet   Oral   Take 1 tablet by mouth 3 (three) times daily. Fibromyalgia         . ibuprofen (ADVIL,MOTRIN) 800 MG tablet    Oral   Take 1 tablet (800 mg total) by mouth 3 (three) times daily.   21 tablet   0   . pantoprazole (PROTONIX) 40 MG tablet   Oral   Take 40 mg by mouth daily.         . promethazine (PHENERGAN) 25 MG tablet   Oral   Take 25-50 mg by mouth every 6 (six) hours as needed. For nausea         . valACYclovir (VALTREX) 1000 MG tablet   Oral   Take 1,000 mg by mouth 3 (three) times daily as needed. Cold Sores         . zolpidem (AMBIEN) 10 MG tablet   Oral   Take 10 mg by mouth at bedtime as needed. Sleep.           BP 130/98  Pulse 86  Temp(Src) 98.3 F (36.8 C) (Oral)  Resp 20  SpO2 97%  LMP 11/26/2012  Physical Exam  Nursing note and vitals reviewed. Constitutional: She appears well-developed and well-nourished.  HENT:  Head: Normocephalic and atraumatic.  Neck: Normal range of motion. Neck supple.  Cardiovascular: Normal rate and intact distal pulses.   Pulses:      Radial pulses are 2+ on the right side, and 2+ on the left side.       Dorsalis pedis pulses are 2+ on the right side, and 2+ on the left side.  Pulmonary/Chest: Effort normal.  Abdominal: Soft. Bowel sounds are normal. She exhibits no distension and no mass.  Musculoskeletal: Normal range of motion. She exhibits tenderness. She exhibits no edema.       Lumbar back: She exhibits tenderness. She exhibits no swelling, no edema and no spasm.  Pain out of proportion to finding upon palpation of bilateral lumbar,  Anterior and posterior thighs and lower extremities.    Neurological: She is alert. She has normal strength. She displays no atrophy and no tremor. No sensory deficit. Gait normal.  Reflex Scores:      Patellar reflexes are 2+ on the right side and 2+ on the left side.      Achilles reflexes are 2+ on the right side and 2+ on the left side. No strength deficit noted in hip and knee flexor and extensor muscle groups.  Ankle flexion and extension intact.  Skin: Skin is warm and dry.   Psychiatric: She has a normal mood and affect.    ED Course  Procedures (including critical care time)  Labs Reviewed - No data to display No results found.   1. Fibromyalgia       MDM  Patient given dilaudid 1 mg IM with improvement in pain.  Pt with chronic pain concerns with fibromyalgia not currently well controlled.  No findings on exam today suggestive other source of pain symptoms.  No focal deficits.  No neuro deficit on exam or by history to suggest emergent or surgical presentation.  Pt was encouraged to f/u with pcp and or pain management - she was given a referral for Dr. Nickola Major.    Chart from last nights visit reviewed,  UA results reviewed.             Burgess Amor, PA 01/12/13 0100

## 2013-01-12 NOTE — ED Notes (Signed)
Pt discharged. Pt stable at time of discharge. Medications reviewed pt has no questions regarding discharge at this time. Pt voiced understanding of discharge instructions.  

## 2013-03-08 ENCOUNTER — Encounter (HOSPITAL_COMMUNITY): Payer: Self-pay | Admitting: Emergency Medicine

## 2013-03-08 ENCOUNTER — Emergency Department (HOSPITAL_COMMUNITY)
Admission: EM | Admit: 2013-03-08 | Discharge: 2013-03-08 | Disposition: A | Discharge status: Home / Self Care | Payer: Medicaid Other | Attending: Emergency Medicine | Admitting: Emergency Medicine

## 2013-03-08 ENCOUNTER — Emergency Department (HOSPITAL_COMMUNITY): Payer: Medicaid Other

## 2013-03-08 DIAGNOSIS — J329 Chronic sinusitis, unspecified: Secondary | ICD-10-CM | POA: Insufficient documentation

## 2013-03-08 DIAGNOSIS — Z8669 Personal history of other diseases of the nervous system and sense organs: Secondary | ICD-10-CM | POA: Insufficient documentation

## 2013-03-08 DIAGNOSIS — H9209 Otalgia, unspecified ear: Secondary | ICD-10-CM | POA: Insufficient documentation

## 2013-03-08 DIAGNOSIS — Z862 Personal history of diseases of the blood and blood-forming organs and certain disorders involving the immune mechanism: Secondary | ICD-10-CM | POA: Insufficient documentation

## 2013-03-08 DIAGNOSIS — R509 Fever, unspecified: Secondary | ICD-10-CM | POA: Insufficient documentation

## 2013-03-08 DIAGNOSIS — K219 Gastro-esophageal reflux disease without esophagitis: Secondary | ICD-10-CM | POA: Insufficient documentation

## 2013-03-08 DIAGNOSIS — J3489 Other specified disorders of nose and nasal sinuses: Secondary | ICD-10-CM | POA: Insufficient documentation

## 2013-03-08 DIAGNOSIS — Z79899 Other long term (current) drug therapy: Secondary | ICD-10-CM | POA: Insufficient documentation

## 2013-03-08 DIAGNOSIS — Z8739 Personal history of other diseases of the musculoskeletal system and connective tissue: Secondary | ICD-10-CM | POA: Insufficient documentation

## 2013-03-08 DIAGNOSIS — R599 Enlarged lymph nodes, unspecified: Secondary | ICD-10-CM | POA: Insufficient documentation

## 2013-03-08 DIAGNOSIS — F17200 Nicotine dependence, unspecified, uncomplicated: Secondary | ICD-10-CM | POA: Insufficient documentation

## 2013-03-08 DIAGNOSIS — Z8742 Personal history of other diseases of the female genital tract: Secondary | ICD-10-CM | POA: Insufficient documentation

## 2013-03-08 DIAGNOSIS — Z87442 Personal history of urinary calculi: Secondary | ICD-10-CM | POA: Insufficient documentation

## 2013-03-08 DIAGNOSIS — R51 Headache: Secondary | ICD-10-CM | POA: Insufficient documentation

## 2013-03-08 DIAGNOSIS — F411 Generalized anxiety disorder: Secondary | ICD-10-CM | POA: Insufficient documentation

## 2013-03-08 DIAGNOSIS — Z8639 Personal history of other endocrine, nutritional and metabolic disease: Secondary | ICD-10-CM | POA: Insufficient documentation

## 2013-03-08 MED ORDER — AZITHROMYCIN 250 MG PO TABS
500.0000 mg | ORAL_TABLET | Freq: Once | ORAL | Status: AC
  Administered 2013-03-08: 500 mg via ORAL
  Filled 2013-03-08: qty 2

## 2013-03-08 MED ORDER — AZITHROMYCIN 250 MG PO TABS: ORAL_TABLET | ORAL | Status: DC

## 2013-03-08 MED ORDER — SODIUM CHLORIDE 0.9 % IV BOLUS (SEPSIS): 1000.0000 mL | Freq: Once | INTRAVENOUS | Status: DC

## 2013-03-08 MED ORDER — METOCLOPRAMIDE HCL 5 MG/ML IJ SOLN: 10.0000 mg | Freq: Once | INTRAMUSCULAR | Status: DC

## 2013-03-08 MED ORDER — DIPHENHYDRAMINE HCL 50 MG/ML IJ SOLN: 25.0000 mg | Freq: Once | INTRAMUSCULAR | Status: DC

## 2013-03-08 MED ORDER — KETOROLAC TROMETHAMINE 30 MG/ML IJ SOLN: 30.0000 mg | Freq: Once | INTRAMUSCULAR | Status: DC

## 2013-03-08 NOTE — ED Provider Notes (Signed)
History     CSN: [id number]  Arrival date & time 03/08/13  1919   First MD Initiated Contact with Patient 03/08/13 1937      Chief Complaint  Patient presents with  . Sore Throat  . Otalgia    (Consider location/radiation/quality/duration/timing/severity/associated sxs/prior treatment) HPI Comments: Patient complains of facial pressure, sore throat, bilateral ear pain for one week. She describes pressure sensation to her face and in her temples that is worse with certain movements. She also reports intermittent fevers at home.  She reports some nasal congestion and" swelling in my throat". She states she has been drinking fluids without difficulty. She denies neck stiffness, visual changes, vomiting, cough or shortness of breath. States she has been taking over-the-counter Benadryl without relief. She states that her daughter has also had similar symptoms.  Patient is a 32 y.o. female presenting with pharyngitis. The history is provided by the patient.  Sore Throat This is a new problem. The current episode started 1 to 4 weeks ago. The problem occurs constantly. The problem has been unchanged. Associated symptoms include congestion, a fever, headaches, a sore throat and swollen glands. Pertinent negatives include no abdominal pain, arthralgias, chest pain, chills, coughing, joint swelling, myalgias, nausea, neck pain, numbness, rash, urinary symptoms, vertigo, visual change or vomiting. Associated symptoms comments: Bilateral ear pain. Nothing aggravates the symptoms. Treatments tried: Benadryl. The treatment provided no relief.    Past Medical History  Diagnosis Date  . Anxiety   . Fibromyalgia   . Restless leg   . Chronic back pain   . GERD (gastroesophageal reflux disease)   . Headache     mygrains  . PONV (postoperative nausea and vomiting)   . Interstitial cystitis   . Polycystic ovarian syndrome   . Kidney stones     interstitual cystitis  . Nephrolithiasis     Past  Surgical History  Procedure Laterality Date  . Cholecystectomy    . Tonsillectomy    . Tubal ligation    . Tympanoplasty      Family History  Problem Relation Age of Onset  . Hypertension Father   . Diabetes Father   . Cancer Other   . Diabetes Other     History  Substance Use Topics  . Smoking status: Current Every Day Smoker -- 1.00 packs/day for 15 years    Types: Cigarettes  . Smokeless tobacco: Never Used  . Alcohol Use: No    OB History   Grav Para Term Preterm Abortions TAB SAB Ect Mult Living   2 2  2      2       Review of Systems  Constitutional: Positive for fever. Negative for chills, activity change and appetite change.  HENT: Positive for ear pain, congestion, sore throat and sinus pressure. Negative for facial swelling, mouth sores, trouble swallowing, neck pain, neck stiffness, dental problem and voice change.   Eyes: Negative for photophobia and visual disturbance.  Respiratory: Negative for cough and chest tightness.   Cardiovascular: Negative for chest pain.  Gastrointestinal: Negative for nausea, vomiting and abdominal pain.  Musculoskeletal: Negative for myalgias, joint swelling and arthralgias.  Skin: Negative for rash.  Neurological: Positive for headaches. Negative for dizziness, vertigo, facial asymmetry, light-headedness and numbness.  Hematological: Positive for adenopathy.  All other systems reviewed and are negative.    Allergies  Omnicef; Codeine; Sulfa antibiotics; Amoxicillin; and Penicillins  Home Medications   Current Outpatient Rx  Name  Route  Sig  Dispense  Refill  .  amitriptyline (ELAVIL) 25 MG tablet   Oral   Take 25 mg by mouth at bedtime.         . carisoprodol (SOMA) 350 MG tablet   Oral   Take 350 mg by mouth daily.          . clonazePAM (KLONOPIN) 1 MG tablet   Oral   Take 1 mg by mouth daily as needed. For anxiety         . diphenhydrAMINE (BENADRYL) 25 MG tablet   Oral   Take 50 mg by mouth daily as  needed for allergies.         . hydrocodone-acetaminophen (LORCET PLUS) 7.5-650 MG per tablet   Oral   Take 1 tablet by mouth 3 (three) times daily. Fibromyalgia         . ibuprofen (ADVIL,MOTRIN) 800 MG tablet   Oral   Take 1 tablet (800 mg total) by mouth 3 (three) times daily.   21 tablet   0   . pantoprazole (PROTONIX) 40 MG tablet   Oral   Take 40 mg by mouth daily.         . promethazine (PHENERGAN) 25 MG tablet   Oral   Take 25-50 mg by mouth every 6 (six) hours as needed. For nausea         . valACYclovir (VALTREX) 1000 MG tablet   Oral   Take 1,000 mg by mouth 3 (three) times daily as needed. Cold Sores         . zolpidem (AMBIEN) 10 MG tablet   Oral   Take 10 mg by mouth at bedtime as needed. Sleep.           Pulse 103  Temp(Src) 99.2 F (37.3 C) (Oral)  Resp 20  Ht 5\' 6"  (1.676 m)  Wt 200 lb (90.719 kg)  BMI 32.3 kg/m2  SpO2 100%  LMP 02/05/2013  Physical Exam  Nursing note and vitals reviewed. Constitutional: She is oriented to person, place, and time. She appears well-developed and well-nourished. No distress.  HENT:  Head: Normocephalic and atraumatic. No trismus in the jaw.  Right Ear: Tympanic membrane and ear canal normal.  Left Ear: Tympanic membrane and ear canal normal.  Nose: Mucosal edema and rhinorrhea present. Right sinus exhibits maxillary sinus tenderness and frontal sinus tenderness. Left sinus exhibits maxillary sinus tenderness and frontal sinus tenderness.  Mouth/Throat: Uvula is midline and mucous membranes are normal. No edematous. Posterior oropharyngeal erythema present. No oropharyngeal exudate, posterior oropharyngeal edema or tonsillar abscesses.  Airway is patent, no edema. No exudates.  Eyes: Conjunctivae and EOM are normal. Pupils are equal, round, and reactive to light.  Neck: Normal range of motion, full passive range of motion without pain and phonation normal. Neck supple. No Brudzinski's sign and no Kernig's  sign noted.  Cardiovascular: Normal rate, regular rhythm, normal heart sounds and intact distal pulses.   No murmur heard. Pulmonary/Chest: Effort normal and breath sounds normal. She has no wheezes. She has no rales.  Abdominal: Soft. Bowel sounds are normal.  Musculoskeletal: She exhibits no edema.  Lymphadenopathy:    She has no cervical adenopathy.  Neurological: She is alert and oriented to person, place, and time. She exhibits normal muscle tone. Coordination normal.  Skin: Skin is warm and dry.    ED Course  Procedures (including critical care time)  Labs Reviewed - No data to display No results found.      MDM    Patient is well  appearing, VSS.  No meningeal signs.  Likely sinusitis.  Airway is patent, no edema.    Patient agrees to zithromax and close f/u with her PMD      Ogechi Kuehnel L. Trisha Mangle, PA-C 03/08/13 2013

## 2013-03-08 NOTE — ED Provider Notes (Signed)
Medical screening examination/treatment/procedure(s) were performed by non-physician practitioner and as supervising physician I was immediately available for consultation/collaboration. Devoria Albe, MD, Armando Gang   Ward Givens, MD 03/08/13 [phone]

## 2013-03-08 NOTE — ED Notes (Signed)
Patient complaining of sore throat and earache x 1 week.

## 2013-03-28 ENCOUNTER — Encounter (HOSPITAL_COMMUNITY): Payer: Self-pay | Admitting: *Deleted

## 2013-03-28 ENCOUNTER — Emergency Department (HOSPITAL_COMMUNITY)
Admission: EM | Admit: 2013-03-28 | Discharge: 2013-03-28 | Disposition: A | Discharge status: Home / Self Care | Payer: Medicaid Other | Attending: Emergency Medicine | Admitting: Emergency Medicine

## 2013-03-28 DIAGNOSIS — R197 Diarrhea, unspecified: Secondary | ICD-10-CM | POA: Insufficient documentation

## 2013-03-28 DIAGNOSIS — F17200 Nicotine dependence, unspecified, uncomplicated: Secondary | ICD-10-CM | POA: Insufficient documentation

## 2013-03-28 DIAGNOSIS — Z880 Allergy status to penicillin: Secondary | ICD-10-CM | POA: Insufficient documentation

## 2013-03-28 DIAGNOSIS — Z8742 Personal history of other diseases of the female genital tract: Secondary | ICD-10-CM | POA: Insufficient documentation

## 2013-03-28 DIAGNOSIS — F411 Generalized anxiety disorder: Secondary | ICD-10-CM | POA: Insufficient documentation

## 2013-03-28 DIAGNOSIS — Z8719 Personal history of other diseases of the digestive system: Secondary | ICD-10-CM | POA: Insufficient documentation

## 2013-03-28 DIAGNOSIS — Z87448 Personal history of other diseases of urinary system: Secondary | ICD-10-CM | POA: Insufficient documentation

## 2013-03-28 DIAGNOSIS — Z8669 Personal history of other diseases of the nervous system and sense organs: Secondary | ICD-10-CM | POA: Insufficient documentation

## 2013-03-28 DIAGNOSIS — G43909 Migraine, unspecified, not intractable, without status migrainosus: Secondary | ICD-10-CM | POA: Insufficient documentation

## 2013-03-28 DIAGNOSIS — Z79899 Other long term (current) drug therapy: Secondary | ICD-10-CM | POA: Insufficient documentation

## 2013-03-28 DIAGNOSIS — M549 Dorsalgia, unspecified: Secondary | ICD-10-CM | POA: Insufficient documentation

## 2013-03-28 DIAGNOSIS — R109 Unspecified abdominal pain: Secondary | ICD-10-CM | POA: Insufficient documentation

## 2013-03-28 DIAGNOSIS — R112 Nausea with vomiting, unspecified: Secondary | ICD-10-CM | POA: Insufficient documentation

## 2013-03-28 DIAGNOSIS — IMO0001 Reserved for inherently not codable concepts without codable children: Secondary | ICD-10-CM | POA: Insufficient documentation

## 2013-03-28 DIAGNOSIS — G8929 Other chronic pain: Secondary | ICD-10-CM | POA: Insufficient documentation

## 2013-03-28 DIAGNOSIS — Z87442 Personal history of urinary calculi: Secondary | ICD-10-CM | POA: Insufficient documentation

## 2013-03-28 LAB — URINALYSIS, ROUTINE W REFLEX MICROSCOPIC
Bilirubin Urine: NEGATIVE
Glucose, UA: NEGATIVE mg/dL
Hgb urine dipstick: NEGATIVE
Ketones, ur: NEGATIVE mg/dL
Leukocytes, UA: NEGATIVE
Nitrite: NEGATIVE
Protein, ur: NEGATIVE mg/dL
Specific Gravity, Urine: >1.03 — ABNORMAL HIGH (ref 1.005–1.030)
Urobilinogen, UA: 0.2 mg/dL (ref 0.0–1.0)
pH: 5.5 (ref 5.0–8.0)

## 2013-03-28 MED ORDER — DIAZEPAM 5 MG PO TABS
ORAL_TABLET | ORAL | Status: AC
  Administered 2013-03-28: 5 mg via ORAL
  Filled 2013-03-28: qty 1

## 2013-03-28 MED ORDER — IBUPROFEN 400 MG PO TABS
600.0000 mg | ORAL_TABLET | Freq: Once | ORAL | Status: AC
  Administered 2013-03-28: 600 mg via ORAL
  Filled 2013-03-28: qty 2

## 2013-03-28 MED ORDER — DIAZEPAM 5 MG PO TABS
5.0000 mg | ORAL_TABLET | Freq: Once | ORAL | Status: AC
  Administered 2013-03-28: 5 mg via ORAL

## 2013-03-28 MED ORDER — HYDROMORPHONE HCL PF 1 MG/ML IJ SOLN
1.0000 mg | Freq: Once | INTRAMUSCULAR | Status: AC
  Administered 2013-03-28: 1 mg via INTRAMUSCULAR
  Filled 2013-03-28: qty 1

## 2013-03-28 MED ORDER — ONDANSETRON 4 MG PO TBDP
4.0000 mg | ORAL_TABLET | Freq: Once | ORAL | Status: AC
  Administered 2013-03-28: 4 mg via ORAL
  Filled 2013-03-28: qty 1

## 2013-03-28 MED ORDER — NAPROXEN 375 MG PO TABS: 375.0000 mg | ORAL_TABLET | Freq: Two times a day (BID) | ORAL | Status: DC | PRN

## 2013-03-28 NOTE — ED Provider Notes (Signed)
History     This chart was scribed for Isabella Razor, MD, MD by Smitty Pluck, ED Scribe. The patient was seen in room [facility]11/[facility] and the patient's care was started at 1:22 PM.   CSN: [id number]  Arrival date & time 03/28/13  1206      Chief Complaint  Patient presents with  . Flank Pain     The history is provided by the patient and medical records. No language interpreter was used.   HPI Comments: Isabella Buchanan is a 32 y.o. female with hx of kidney stones who presents to the Emergency Department complaining of constant, sharp left flank pain onset 1 day ago. She reports sudden onset. She reports that she has nausea, vomiting and dysuria. She has taken hydrocodone without relief of pain. She states this feels similar to past episodes of kidney stones. She has had stint placed for past kidney stones and she has passed 6 kidney stones. Pt denies vaginal bleeding, vaginal discharge, hematuria, fever, chills, diarrhea, weakness, cough, SOB and any other pain. Pt denies chances of being pregnant.   Urologist is Dr. Retta Diones   Past Medical History  Diagnosis Date  . Anxiety   . Fibromyalgia   . Restless leg   . Chronic back pain   . GERD (gastroesophageal reflux disease)   . Headache     mygrains  . PONV (postoperative nausea and vomiting)   . Interstitial cystitis   . Polycystic ovarian syndrome   . Kidney stones     interstitual cystitis  . Nephrolithiasis     Past Surgical History  Procedure Laterality Date  . Cholecystectomy    . Tonsillectomy    . Tubal ligation    . Tympanoplasty      Family History  Problem Relation Age of Onset  . Hypertension Father   . Diabetes Father   . Cancer Other   . Diabetes Other     History  Substance Use Topics  . Smoking status: Current Every Day Smoker -- 1.00 packs/day for 15 years    Types: Cigarettes  . Smokeless tobacco: Never Used  . Alcohol Use: No    OB History   Grav Para Term Preterm Abortions TAB SAB Ect  Mult Living   2 2  2      2       Review of Systems 10 Systems reviewed and all are negative for acute change except as noted in the HPI.   Allergies  Omnicef; Codeine; Sulfa antibiotics; Amoxicillin; and Penicillins  Home Medications   Current Outpatient Rx  Name  Route  Sig  Dispense  Refill  . amitriptyline (ELAVIL) 25 MG tablet   Oral   Take 25 mg by mouth at bedtime.         . carisoprodol (SOMA) 350 MG tablet   Oral   Take 350 mg by mouth daily.          . clonazePAM (KLONOPIN) 1 MG tablet   Oral   Take 1 mg by mouth daily as needed. For anxiety         . diphenhydrAMINE (BENADRYL) 25 MG tablet   Oral   Take 50 mg by mouth daily as needed for allergies.         . hydrocodone-acetaminophen (LORCET PLUS) 7.5-650 MG per tablet   Oral   Take 1 tablet by mouth 3 (three) times daily. Fibromyalgia         . promethazine (PHENERGAN) 25 MG tablet  Oral   Take 25-50 mg by mouth every 6 (six) hours as needed. For nausea         . zolpidem (AMBIEN) 10 MG tablet   Oral   Take 10 mg by mouth at bedtime as needed. Sleep.           BP 121/79  Pulse 102  Temp(Src) 98.2 F (36.8 C) (Oral)  Resp 18  Ht 5\' 7"  (1.702 m)  Wt 205 lb (92.987 kg)  BMI 32.1 kg/m2  SpO2 99%  LMP 02/24/2013  Physical Exam  Nursing note and vitals reviewed. Constitutional: She appears well-developed and well-nourished. No distress.  HENT:  Head: Normocephalic and atraumatic.  Eyes: Conjunctivae are normal. Right eye exhibits no discharge. Left eye exhibits no discharge.  Neck: Neck supple.  Cardiovascular: Normal rate, regular rhythm and normal heart sounds.  Exam reveals no gallop and no friction rub.   No murmur heard. Pulmonary/Chest: Effort normal and breath sounds normal. No respiratory distress.  Abdominal: Soft. She exhibits no distension. There is no tenderness.  questionable left CVA tenderness  Musculoskeletal: She exhibits no edema and no tenderness.   Neurological: She is alert.  Skin: Skin is warm and dry.  Psychiatric: She has a normal mood and affect. Her behavior is normal. Thought content normal.    ED Course  Procedures (including critical care time) DIAGNOSTIC STUDIES: Oxygen Saturation is 99% on room air, normal by my interpretation.    COORDINATION OF CARE: 1:24 PM Discussed ED treatment with pt (labs) and pt agrees.     Labs Reviewed  URINALYSIS, ROUTINE W REFLEX MICROSCOPIC - Abnormal; Notable for the following:    Specific Gravity, Urine >1.030 (*)    All other components within normal limits  URINE MICROSCOPIC-ADD ON   No results found.   1. Left flank pain       MDM  31yF with L flank pain. No abdominal tenderness. UA with blood or evidence of infection. Possibly muscle strain? Plan symptomatic tx. Return precautions discussed.       I personally preformed the services scribed in my presence. The recorded information has been reviewed is accurate. Isabella Razor, MD.    Isabella Razor, MD 04/02/13 [phone]

## 2013-03-28 NOTE — ED Notes (Signed)
Lt flank pain, onset yesterday,N/V,  Hx of kidney stones

## 2013-03-28 NOTE — ED Notes (Signed)
Patient with no complaints at this time. Respirations even and unlabored. Skin warm/dry. Discharge instructions reviewed with patient at this time. Patient given opportunity to voice concerns/ask questions. Patient discharged at this time and left Emergency Department with steady gait.   

## 2013-04-04 ENCOUNTER — Encounter (HOSPITAL_COMMUNITY): Payer: Self-pay | Admitting: *Deleted

## 2013-04-04 ENCOUNTER — Emergency Department (HOSPITAL_COMMUNITY)
Admission: EM | Admit: 2013-04-04 | Discharge: 2013-04-04 | Disposition: A | Discharge status: Home / Self Care | Payer: Medicaid Other | Attending: Emergency Medicine | Admitting: Emergency Medicine

## 2013-04-04 DIAGNOSIS — M549 Dorsalgia, unspecified: Secondary | ICD-10-CM

## 2013-04-04 DIAGNOSIS — Z3202 Encounter for pregnancy test, result negative: Secondary | ICD-10-CM | POA: Insufficient documentation

## 2013-04-04 DIAGNOSIS — M545 Low back pain, unspecified: Secondary | ICD-10-CM | POA: Insufficient documentation

## 2013-04-04 DIAGNOSIS — Z880 Allergy status to penicillin: Secondary | ICD-10-CM | POA: Insufficient documentation

## 2013-04-04 DIAGNOSIS — Z8739 Personal history of other diseases of the musculoskeletal system and connective tissue: Secondary | ICD-10-CM | POA: Insufficient documentation

## 2013-04-04 DIAGNOSIS — R112 Nausea with vomiting, unspecified: Secondary | ICD-10-CM | POA: Insufficient documentation

## 2013-04-04 DIAGNOSIS — G8929 Other chronic pain: Secondary | ICD-10-CM

## 2013-04-04 DIAGNOSIS — Z8669 Personal history of other diseases of the nervous system and sense organs: Secondary | ICD-10-CM | POA: Insufficient documentation

## 2013-04-04 DIAGNOSIS — Z8742 Personal history of other diseases of the female genital tract: Secondary | ICD-10-CM | POA: Insufficient documentation

## 2013-04-04 DIAGNOSIS — Z862 Personal history of diseases of the blood and blood-forming organs and certain disorders involving the immune mechanism: Secondary | ICD-10-CM | POA: Insufficient documentation

## 2013-04-04 DIAGNOSIS — Z79899 Other long term (current) drug therapy: Secondary | ICD-10-CM | POA: Insufficient documentation

## 2013-04-04 DIAGNOSIS — Z8639 Personal history of other endocrine, nutritional and metabolic disease: Secondary | ICD-10-CM | POA: Insufficient documentation

## 2013-04-04 DIAGNOSIS — R300 Dysuria: Secondary | ICD-10-CM | POA: Insufficient documentation

## 2013-04-04 DIAGNOSIS — F17200 Nicotine dependence, unspecified, uncomplicated: Secondary | ICD-10-CM | POA: Insufficient documentation

## 2013-04-04 DIAGNOSIS — Z8719 Personal history of other diseases of the digestive system: Secondary | ICD-10-CM | POA: Insufficient documentation

## 2013-04-04 DIAGNOSIS — Z87442 Personal history of urinary calculi: Secondary | ICD-10-CM | POA: Insufficient documentation

## 2013-04-04 DIAGNOSIS — F411 Generalized anxiety disorder: Secondary | ICD-10-CM | POA: Insufficient documentation

## 2013-04-04 LAB — URINALYSIS, ROUTINE W REFLEX MICROSCOPIC
Bilirubin Urine: NEGATIVE
Hgb urine dipstick: NEGATIVE
Protein, ur: NEGATIVE mg/dL
Urobilinogen, UA: 0.2 mg/dL (ref 0.0–1.0)

## 2013-04-04 LAB — BASIC METABOLIC PANEL
BUN: 10 mg/dL (ref 6–23)
Creatinine, Ser: 0.81 mg/dL (ref 0.50–1.10)
GFR calc non Af Amer: >90 mL/min (ref >90)
Glucose, Bld: 102 mg/dL — ABNORMAL HIGH (ref 70–99)
Potassium: 3.8 mEq/L (ref 3.5–5.1)

## 2013-04-04 LAB — CBC WITH DIFFERENTIAL/PLATELET
Basophils Relative: 1 % (ref 0–1)
Eosinophils Absolute: 0.4 10*3/uL (ref 0.0–0.7)
Hemoglobin: 14.6 g/dL (ref 12.0–15.0)
MCH: 28.8 pg (ref 26.0–34.0)
MCHC: 33 g/dL (ref 30.0–36.0)
Monocytes Absolute: 0.7 10*3/uL (ref 0.1–1.0)
Monocytes Relative: 8 % (ref 3–12)
Neutrophils Relative %: 55 % (ref 43–77)
RDW: 13.9 % (ref 11.5–15.5)

## 2013-04-04 NOTE — ED Notes (Signed)
Seen here on 5/9 with lt flank pain, told she had a kidney stone, says she is no better.

## 2013-04-04 NOTE — ED Notes (Signed)
Checked w/lab on u/a results.  State it should be up in 5 mins.

## 2013-04-04 NOTE — ED Provider Notes (Signed)
History    This chart was scribed for Isabella B. Bernette Mayers, MD by Toya Smothers, ED Scribe. The patient was seen in room APA05/APA05. Patient's care was started at 1132.   CSN: [id number]  Arrival date & time 04/04/13  1132   First MD Initiated Contact with Patient 04/04/13 1502      Chief Complaint  Patient presents with  . Flank Pain    The history is provided by the patient. No language interpreter was used.    HPI Comments:   KYNESHA GUERIN is a 32 y.o. female with h/o kidney stones, who presents to the Emergency Department complaining of 1 week of unchanged, constant, sharp left side flank pain.  Pain is described similar to that of a previous kidney stone. Pt was seen here 03/28/13 for the same complaint and Rx with Naproxen for a possible muscle strain. Despite taking Naproxen there has been no relief. She endorses associated nausea, vomiting and dysuria. Pt denies vaginal bleeding, vaginal discharge, hematuria, fever, chills, diarrhea, weakness, cough, SOB and any other pain. Pt denies chances of being pregnant.   PCP: Dr. Doroteo Glassman  Past Medical History  Diagnosis Date  . Anxiety   . Fibromyalgia   . Restless leg   . Chronic back pain   . GERD (gastroesophageal reflux disease)   . Headache     mygrains  . PONV (postoperative nausea and vomiting)   . Interstitial cystitis   . Polycystic ovarian syndrome   . Kidney stones     interstitual cystitis  . Nephrolithiasis     Past Surgical History  Procedure Laterality Date  . Cholecystectomy    . Tonsillectomy    . Tubal ligation    . Tympanoplasty      Family History  Problem Relation Age of Onset  . Hypertension Father   . Diabetes Father   . Cancer Other   . Diabetes Other     History  Substance Use Topics  . Smoking status: Current Every Day Smoker -- 1.00 packs/day for 15 years    Types: Cigarettes  . Smokeless tobacco: Never Used  . Alcohol Use: No    OB History   Grav Para Term Preterm  Abortions TAB SAB Ect Mult Living   2 2  2      2       Review of Systems  Genitourinary: Positive for flank pain.  All other systems reviewed and are negative.    Allergies  Omnicef; Codeine; Sulfa antibiotics; Amoxicillin; and Penicillins  Home Medications   Current Outpatient Rx  Name  Route  Sig  Dispense  Refill  . amitriptyline (ELAVIL) 25 MG tablet   Oral   Take 25 mg by mouth at bedtime.         . carisoprodol (SOMA) 350 MG tablet   Oral   Take 350 mg by mouth daily.          . clonazePAM (KLONOPIN) 1 MG tablet   Oral   Take 1 mg by mouth daily as needed. For anxiety         . diphenhydrAMINE (BENADRYL) 25 MG tablet   Oral   Take 50 mg by mouth daily as needed for allergies.         . hydrocodone-acetaminophen (LORCET PLUS) 7.5-650 MG per tablet   Oral   Take 1 tablet by mouth 3 (three) times daily. Fibromyalgia         . naproxen (NAPROSYN) 375 MG tablet  Oral   Take 1 tablet (375 mg total) by mouth 2 (two) times daily as needed.   20 tablet   0   . promethazine (PHENERGAN) 25 MG tablet   Oral   Take 25-50 mg by mouth every 6 (six) hours as needed. For nausea         . zolpidem (AMBIEN) 10 MG tablet   Oral   Take 10 mg by mouth at bedtime as needed. Sleep.           BP 128/89  Pulse 93  Temp(Src) 98.9 F (37.2 C) (Oral)  Resp 18  Ht 5\' 7"  (1.702 m)  Wt 214 lb (97.07 kg)  BMI 33.51 kg/m2  SpO2 97%  LMP 02/24/2013  Physical Exam  Nursing note and vitals reviewed. Constitutional: She is oriented to person, place, and time. She appears well-developed and well-nourished.  HENT:  Head: Normocephalic and atraumatic.  Eyes: EOM are normal. Pupils are equal, round, and reactive to light.  Neck: Normal range of motion. Neck supple.  Cardiovascular: Normal rate, normal heart sounds and intact distal pulses.   Pulmonary/Chest: Effort normal and breath sounds normal.  Abdominal: Bowel sounds are normal. She exhibits no distension.  There is no tenderness.  Musculoskeletal: Normal range of motion. She exhibits no edema.  Tenderness to left lower paraspinal  Neurological: She is alert and oriented to person, place, and time. She has normal strength. No cranial nerve deficit or sensory deficit.  Skin: Skin is warm and dry. No rash noted.  Psychiatric: She has a normal mood and affect.    ED Course  Procedures DIAGNOSTIC STUDIES: Oxygen Saturation is 97% on room air, normal by my interpretation.    COORDINATION OF CARE: 123:33- Ordered Basic metabolic panel, CBC with Differential, and Urinalysis, Routine w reflex microscopic. 15:17- Evaluated Pt. Pt is awake, alert, and without distress. Pt appears to be in pain. 15:20- Patient understand and agree with initial ED impression and plan with expectations set for ED visit.   Labs Reviewed  BASIC METABOLIC PANEL - Abnormal; Notable for the following:    Glucose, Bld 102 (*)    All other components within normal limits  URINALYSIS, ROUTINE W REFLEX MICROSCOPIC - Abnormal; Notable for the following:    Specific Gravity, Urine >1.030 (*)    All other components within normal limits  CBC WITH DIFFERENTIAL  PREGNANCY, URINE   No results found.   1. Chronic back pain       MDM  Pt with chronic back pain, no CVA tenderness. Pain is clearly reproducible in the lumbar paraspinal muscles. No concern for renal stone or infection. Advised to follow up with PCP. No Rx for narcotics given.      I personally performed the services described in this documentation, which was scribed in my presence. The recorded information has been reviewed and is accurate.      Isabella B. Bernette Mayers, MD 04/04/13 [phone]

## 2013-04-27 ENCOUNTER — Encounter (HOSPITAL_COMMUNITY): Payer: Self-pay | Admitting: *Deleted

## 2013-04-27 ENCOUNTER — Emergency Department (HOSPITAL_COMMUNITY)
Admission: EM | Admit: 2013-04-27 | Discharge: 2013-04-27 | Discharge status: Against Medical Advice | Payer: Medicaid Other | Attending: Emergency Medicine | Admitting: Emergency Medicine

## 2013-04-27 ENCOUNTER — Emergency Department (HOSPITAL_COMMUNITY): Payer: Medicaid Other

## 2013-04-27 DIAGNOSIS — IMO0002 Reserved for concepts with insufficient information to code with codable children: Secondary | ICD-10-CM | POA: Insufficient documentation

## 2013-04-27 DIAGNOSIS — Z8719 Personal history of other diseases of the digestive system: Secondary | ICD-10-CM | POA: Insufficient documentation

## 2013-04-27 DIAGNOSIS — S060X9A Concussion with loss of consciousness of unspecified duration, initial encounter: Secondary | ICD-10-CM | POA: Insufficient documentation

## 2013-04-27 DIAGNOSIS — R110 Nausea: Secondary | ICD-10-CM | POA: Insufficient documentation

## 2013-04-27 DIAGNOSIS — G8929 Other chronic pain: Secondary | ICD-10-CM | POA: Insufficient documentation

## 2013-04-27 DIAGNOSIS — Z79899 Other long term (current) drug therapy: Secondary | ICD-10-CM | POA: Insufficient documentation

## 2013-04-27 DIAGNOSIS — Z8639 Personal history of other endocrine, nutritional and metabolic disease: Secondary | ICD-10-CM | POA: Insufficient documentation

## 2013-04-27 DIAGNOSIS — Z862 Personal history of diseases of the blood and blood-forming organs and certain disorders involving the immune mechanism: Secondary | ICD-10-CM | POA: Insufficient documentation

## 2013-04-27 DIAGNOSIS — W19XXXA Unspecified fall, initial encounter: Secondary | ICD-10-CM

## 2013-04-27 DIAGNOSIS — Z87442 Personal history of urinary calculi: Secondary | ICD-10-CM | POA: Insufficient documentation

## 2013-04-27 DIAGNOSIS — Y9389 Activity, other specified: Secondary | ICD-10-CM | POA: Insufficient documentation

## 2013-04-27 DIAGNOSIS — F17200 Nicotine dependence, unspecified, uncomplicated: Secondary | ICD-10-CM | POA: Insufficient documentation

## 2013-04-27 DIAGNOSIS — Y929 Unspecified place or not applicable: Secondary | ICD-10-CM | POA: Insufficient documentation

## 2013-04-27 DIAGNOSIS — N3010 Interstitial cystitis (chronic) without hematuria: Secondary | ICD-10-CM | POA: Insufficient documentation

## 2013-04-27 DIAGNOSIS — F411 Generalized anxiety disorder: Secondary | ICD-10-CM | POA: Insufficient documentation

## 2013-04-27 DIAGNOSIS — W1809XA Striking against other object with subsequent fall, initial encounter: Secondary | ICD-10-CM | POA: Insufficient documentation

## 2013-04-27 DIAGNOSIS — Z8669 Personal history of other diseases of the nervous system and sense organs: Secondary | ICD-10-CM | POA: Insufficient documentation

## 2013-04-27 DIAGNOSIS — Z880 Allergy status to penicillin: Secondary | ICD-10-CM | POA: Insufficient documentation

## 2013-04-27 DIAGNOSIS — H538 Other visual disturbances: Secondary | ICD-10-CM | POA: Insufficient documentation

## 2013-04-27 DIAGNOSIS — Z8739 Personal history of other diseases of the musculoskeletal system and connective tissue: Secondary | ICD-10-CM | POA: Insufficient documentation

## 2013-04-27 MED ORDER — HYDROMORPHONE HCL PF 1 MG/ML IJ SOLN
1.0000 mg | Freq: Once | INTRAMUSCULAR | Status: AC
  Administered 2013-04-27: 1 mg via INTRAMUSCULAR
  Filled 2013-04-27: qty 1

## 2013-04-27 MED ORDER — KETOROLAC TROMETHAMINE 60 MG/2ML IM SOLN
60.0000 mg | Freq: Once | INTRAMUSCULAR | Status: AC
  Administered 2013-04-27: 60 mg via INTRAMUSCULAR
  Filled 2013-04-27: qty 2

## 2013-04-27 NOTE — ED Notes (Signed)
Pt upset because she was wanting something else for pain, states dilaudid and toradol "did nothing", pt also upset due to wait time, explained to pt that awaiting for radiologist to read CT scan, pt states "y'all aren't doing anything for me", pt insisting on leaving AMA, EDP made aware of pt leaving AMA, pt did sign AMA form, offered wheelchair for pt and pt refused and insisted on walking out

## 2013-04-27 NOTE — ED Notes (Signed)
MD at bedside. 

## 2013-04-27 NOTE — ED Provider Notes (Signed)
History  This chart was scribed for Isabella Lennert, MD by Bennett Scrape, ED Scribe. This patient was seen in room APA18/APA18 and the patient's care was started at 7:22 PM.  CSN: [id number]  Arrival date & time 04/27/13  1826   First MD Initiated Contact with Patient 04/27/13 1922      Chief Complaint  Patient presents with  . Fall     Patient is a 32 y.o. female presenting with fall. The history is provided by the patient. No language interpreter was used.  Fall This is a new problem. The current episode started yesterday. The problem occurs constantly. The problem has been gradually worsening. Associated symptoms include headaches. Pertinent negatives include no chest pain and no abdominal pain. Nothing aggravates the symptoms. Nothing relieves the symptoms. She has tried nothing for the symptoms.    HPI Comments: Isabella Buchanan is a 32 y.o. female who presents to the Emergency Department complaining of a fall yesterday. Pt states that she fell and hit the back of her head on her closet door knocking her unconscious for an unknown period of time. She c/o associated disorientation, blurred vision, occipitally located HA, nausea, back pain and trouble balancing since. She denies emesis as an associated symptom. She has a h/o anxiety, chronic back pain and fibromyalgia. Pt is a current everyday smoker but denies alcohol use.   Past Medical History  Diagnosis Date  . Anxiety   . Fibromyalgia   . Restless leg   . Chronic back pain   . GERD (gastroesophageal reflux disease)   . Headache(784.0)     mygrains  . PONV (postoperative nausea and vomiting)   . Interstitial cystitis   . Polycystic ovarian syndrome   . Kidney stones     interstitual cystitis  . Nephrolithiasis     Past Surgical History  Procedure Laterality Date  . Cholecystectomy    . Tonsillectomy    . Tubal ligation    . Tympanoplasty      Family History  Problem Relation Age of Onset  . Hypertension  Father   . Diabetes Father   . Cancer Other   . Diabetes Other     History  Substance Use Topics  . Smoking status: Current Every Day Smoker -- 1.00 packs/day for 15 years    Types: Cigarettes  . Smokeless tobacco: Never Used  . Alcohol Use: No    OB History   Grav Para Term Preterm Abortions TAB SAB Ect Mult Living   2 2  2      2       Review of Systems  Constitutional: Negative for appetite change and fatigue.  HENT: Negative for congestion, sinus pressure and ear discharge.   Eyes: Positive for visual disturbance. Negative for discharge.  Respiratory: Negative for cough.   Cardiovascular: Negative for chest pain.  Gastrointestinal: Positive for nausea. Negative for vomiting, abdominal pain and diarrhea.  Genitourinary: Negative for frequency and hematuria.  Musculoskeletal: Positive for back pain.  Skin: Negative for rash.  Neurological: Positive for headaches. Negative for seizures.  Psychiatric/Behavioral: Positive for confusion. Negative for hallucinations.    Allergies  Omnicef; Codeine; Sulfa antibiotics; Amoxicillin; and Penicillins  Home Medications   Current Outpatient Rx  Name  Route  Sig  Dispense  Refill  . amitriptyline (ELAVIL) 25 MG tablet   Oral   Take 25 mg by mouth at bedtime.         . carisoprodol (SOMA) 350 MG tablet   Oral  Take 350 mg by mouth daily.          . clonazePAM (KLONOPIN) 1 MG tablet   Oral   Take 1 mg by mouth daily as needed for anxiety. For anxiety         . diphenhydrAMINE (BENADRYL) 25 MG tablet   Oral   Take 50 mg by mouth daily as needed for allergies.         Marland Kitchen HYDROcodone-acetaminophen (NORCO) 10-325 MG per tablet   Oral   Take 1 tablet by mouth 3 (three) times daily.         . ondansetron (ZOFRAN) 8 MG tablet   Oral   Take 8 mg by mouth every 8 (eight) hours as needed for nausea.         Marland Kitchen zolpidem (AMBIEN) 10 MG tablet   Oral   Take 10 mg by mouth at bedtime as needed. Sleep.            Triage Vitals: BP 125/81  Pulse 112  Temp(Src) 97.5 F (36.4 C) (Oral)  Resp 20  Ht 5\' 7"  (1.702 m)  Wt 200 lb (90.719 kg)  BMI 31.32 kg/m2  SpO2 96%  LMP 04/13/2013  Physical Exam  Nursing note and vitals reviewed. Constitutional: She is oriented to person, place, and time. She appears well-developed and well-nourished.  Pt is tearful  HENT:  Head: Normocephalic.  Mild occipital tenderness  Eyes: Conjunctivae and EOM are normal. No scleral icterus.  Neck: Neck supple. No thyromegaly present.  Cardiovascular: Normal rate and regular rhythm.  Exam reveals no gallop and no friction rub.   No murmur heard. Pulmonary/Chest: Effort normal. No stridor. She has no wheezes. She has no rales. She exhibits no tenderness.  Abdominal: Soft. She exhibits no distension. There is no tenderness. There is no rebound.  Musculoskeletal: Normal range of motion. She exhibits no edema.  Lymphadenopathy:    She has no cervical adenopathy.  Neurological: She is alert and oriented to person, place, and time. Coordination normal.  Skin: Skin is warm and dry. No rash noted. No erythema.  Psychiatric: She has a normal mood and affect. Her behavior is normal.    ED Course  Procedures (including critical care time)  Medications  HYDROmorphone (DILAUDID) injection 1 mg (not administered)    DIAGNOSTIC STUDIES: Oxygen Saturation is 96% on room air, normal by my interpretation.    COORDINATION OF CARE: 7:31 PM-Discussed treatment plan which includes medications, CT of head and CT of c-spine with pt at bedside and pt agreed to plan.   Labs Reviewed - No data to display No results found.   No diagnosis found.    MDM  Contusion to occipital head.  Pt left ama before results from ct back      The chart was scribed for me under my direct supervision.  I personally performed the history, physical, and medical decision making and all procedures in the evaluation of this  patient.Isabella Lennert, MD 04/27/13 2039

## 2013-04-27 NOTE — ED Notes (Addendum)
Pt states that she was standing up and fell hitting her head against the closet door, unsure of any LOC, remembers the fall, pt states that she has been disoriented today, dizzy, headache. denies any n/v. Pt states that she normally takes hydrocodone 10-325mg  and she took one dose around 10 this am. Speech mumbled at times during triage, pt appeared to be 'dosing" off, will respond when spoken to.

## 2013-04-27 NOTE — ED Notes (Signed)
Pt alert and oriented to name, place, year and age but not to month-thinks it's May, gait unsteady in room, pt slurring her words, admits to taking 10mg  hydrocodone at 1000 today, states that she hit her head on closet yesterday, denies N/V, + blurry vision, pt tearful in room, wants something for pain

## 2013-06-28 ENCOUNTER — Emergency Department (HOSPITAL_COMMUNITY)
Admission: EM | Admit: 2013-06-28 | Discharge: 2013-06-28 | Discharge status: Against Medical Advice | Payer: Medicaid Other | Attending: Emergency Medicine | Admitting: Emergency Medicine

## 2013-06-28 ENCOUNTER — Encounter (HOSPITAL_COMMUNITY): Payer: Self-pay | Admitting: *Deleted

## 2013-06-28 ENCOUNTER — Emergency Department (HOSPITAL_COMMUNITY): Payer: Medicaid Other

## 2013-06-28 DIAGNOSIS — Z79899 Other long term (current) drug therapy: Secondary | ICD-10-CM | POA: Insufficient documentation

## 2013-06-28 DIAGNOSIS — Z87442 Personal history of urinary calculi: Secondary | ICD-10-CM | POA: Insufficient documentation

## 2013-06-28 DIAGNOSIS — Z8742 Personal history of other diseases of the female genital tract: Secondary | ICD-10-CM | POA: Insufficient documentation

## 2013-06-28 DIAGNOSIS — M7989 Other specified soft tissue disorders: Secondary | ICD-10-CM | POA: Insufficient documentation

## 2013-06-28 DIAGNOSIS — F17200 Nicotine dependence, unspecified, uncomplicated: Secondary | ICD-10-CM | POA: Insufficient documentation

## 2013-06-28 DIAGNOSIS — Z8679 Personal history of other diseases of the circulatory system: Secondary | ICD-10-CM | POA: Insufficient documentation

## 2013-06-28 DIAGNOSIS — R112 Nausea with vomiting, unspecified: Secondary | ICD-10-CM | POA: Insufficient documentation

## 2013-06-28 DIAGNOSIS — Z862 Personal history of diseases of the blood and blood-forming organs and certain disorders involving the immune mechanism: Secondary | ICD-10-CM | POA: Insufficient documentation

## 2013-06-28 DIAGNOSIS — M79609 Pain in unspecified limb: Secondary | ICD-10-CM | POA: Insufficient documentation

## 2013-06-28 DIAGNOSIS — Z87448 Personal history of other diseases of urinary system: Secondary | ICD-10-CM | POA: Insufficient documentation

## 2013-06-28 DIAGNOSIS — Z8719 Personal history of other diseases of the digestive system: Secondary | ICD-10-CM | POA: Insufficient documentation

## 2013-06-28 DIAGNOSIS — Z8639 Personal history of other endocrine, nutritional and metabolic disease: Secondary | ICD-10-CM | POA: Insufficient documentation

## 2013-06-28 DIAGNOSIS — Z8739 Personal history of other diseases of the musculoskeletal system and connective tissue: Secondary | ICD-10-CM | POA: Insufficient documentation

## 2013-06-28 DIAGNOSIS — F411 Generalized anxiety disorder: Secondary | ICD-10-CM | POA: Insufficient documentation

## 2013-06-28 DIAGNOSIS — Z880 Allergy status to penicillin: Secondary | ICD-10-CM | POA: Insufficient documentation

## 2013-06-28 DIAGNOSIS — G8929 Other chronic pain: Secondary | ICD-10-CM | POA: Insufficient documentation

## 2013-06-28 DIAGNOSIS — M549 Dorsalgia, unspecified: Secondary | ICD-10-CM | POA: Insufficient documentation

## 2013-06-28 DIAGNOSIS — Z8669 Personal history of other diseases of the nervous system and sense organs: Secondary | ICD-10-CM | POA: Insufficient documentation

## 2013-06-28 DIAGNOSIS — M79605 Pain in left leg: Secondary | ICD-10-CM

## 2013-06-28 MED ORDER — OXYCODONE-ACETAMINOPHEN 5-325 MG PO TABS
1.0000 | ORAL_TABLET | Freq: Once | ORAL | Status: AC
  Administered 2013-06-28: 1 via ORAL
  Filled 2013-06-28: qty 1

## 2013-06-28 NOTE — ED Provider Notes (Signed)
CSN: [id number]     Arrival date & time 06/28/13  1541 History     First MD Initiated Contact with Patient 06/28/13 1730     Chief Complaint  Patient presents with  . Leg Pain   (Consider location/radiation/quality/duration/timing/severity/associated sxs/prior Treatment) Patient is a 32 y.o. female presenting with leg pain. The history is provided by the patient.  Leg Pain Location:  Leg Time since incident:  2 hours Leg location:  L lower leg Pain details:    Quality:  Shooting   Radiates to:  Does not radiate   Severity:  Severe   Onset quality:  Sudden   Duration:  1 hour   Timing:  Constant   Progression:  Unchanged Chronicity:  New Dislocation: no   Foreign body present:  No foreign bodies Prior injury to area:  No Relieved by:  Nothing Worsened by:  Bearing weight Associated symptoms: back pain (chronic)   Associated symptoms: no fever and no neck pain    Isabella Buchanan is a 32 y.o. female who presents to the ED with left lower leg pain. The pain is located on he lateral aspect of the lower leg. She complains of swelling. She denies any injury and states she was just sitting on the bed when the pain started. She took 2 of her 10/325mg  Vicodin without relief so she came in to the ED. She states she does not work so could not have injured working and has not been doing any walking. Hx of restless leg syndrome and fibromyalgia.  Past Medical History  Diagnosis Date  . Anxiety   . Fibromyalgia   . Restless leg   . Chronic back pain   . GERD (gastroesophageal reflux disease)   . Headache(784.0)     mygrains  . PONV (postoperative nausea and vomiting)   . Interstitial cystitis   . Polycystic ovarian syndrome   . Kidney stones     interstitual cystitis  . Nephrolithiasis    Past Surgical History  Procedure Laterality Date  . Cholecystectomy    . Tonsillectomy    . Tubal ligation    . Tympanoplasty     Family History  Problem Relation Age of Onset  .  Hypertension Father   . Diabetes Father   . Cancer Other   . Diabetes Other    History  Substance Use Topics  . Smoking status: Current Every Day Smoker -- 1.00 packs/day for 15 years    Types: Cigarettes  . Smokeless tobacco: Never Used  . Alcohol Use: No   OB History   Grav Para Term Preterm Abortions TAB SAB Ect Mult Living   2 2  2      2      Review of Systems  Constitutional: Negative for fever.  HENT: Negative for neck pain.   Musculoskeletal: Positive for back pain (chronic).  Skin: Negative for rash.  Neurological: Negative for headaches.  Psychiatric/Behavioral: The patient is not nervous/anxious.     Allergies  Omnicef; Codeine; Sulfa antibiotics; Amoxicillin; and Penicillins  Home Medications   Current Outpatient Rx  Name  Route  Sig  Dispense  Refill  . amitriptyline (ELAVIL) 25 MG tablet   Oral   Take 25 mg by mouth at bedtime.         . carisoprodol (SOMA) 350 MG tablet   Oral   Take 350 mg by mouth daily.          . clonazePAM (KLONOPIN) 1 MG tablet   Oral  Take 1 mg by mouth daily as needed for anxiety. For anxiety         . diphenhydrAMINE (BENADRYL) 25 MG tablet   Oral   Take 50 mg by mouth daily as needed for allergies.         Marland Kitchen HYDROcodone-acetaminophen (NORCO) 10-325 MG per tablet   Oral   Take 1 tablet by mouth 3 (three) times daily.         . ondansetron (ZOFRAN) 8 MG tablet   Oral   Take 8 mg by mouth every 8 (eight) hours as needed for nausea.         Marland Kitchen zolpidem (AMBIEN) 10 MG tablet   Oral   Take 10 mg by mouth at bedtime as needed. Sleep.          BP 152/81  Pulse 75  Temp(Src) 98.5 F (36.9 C) (Oral)  Resp 20  Ht 5\' 6"  (1.676 m)  Wt 199 lb 9 oz (90.521 kg)  BMI 32.23 kg/m2  SpO2 98%  LMP 06/22/2013 Physical Exam  Nursing note and vitals reviewed. Constitutional: She is oriented to person, place, and time. She appears well-developed and well-nourished. No distress.  HENT:  Head: Normocephalic.    Eyes: EOM are normal.  Neck: Neck supple.  Cardiovascular: Normal rate.   Pulmonary/Chest: Effort normal.  Musculoskeletal: Normal range of motion.       Left lower leg: She exhibits tenderness. She exhibits no deformity and no laceration. Swelling: minimal.       Legs: Patient has no calf pain and no pain with deep palpation of the popliteal area. Pedal pulses strong, adequate circulation, good touch sensation.   Neurological: She is alert and oriented to person, place, and time. She has normal strength. No cranial nerve deficit or sensory deficit.  Skin: Skin is warm and dry.    ED Course  Dg Tibia/fibula Left  06/28/2013   *RADIOLOGY REPORT*  Clinical Data: Left lower leg pain.  LEFT TIBIA AND FIBULA - 2 VIEW  Comparison: None  Findings: No evidence of acute fracture, subluxation or dislocation identified.  No radio-opaque foreign bodies are present.  No focal bony lesions are noted.  The joint spaces are unremarkable.  IMPRESSION: No bony abnormalities.   Original Report Authenticated By: Harmon Pier, M.D.    Procedures  Percocet given for pain and patient states did not help a bit. Had already taken Hydrocodone 10/325 mg. Prior to arrival to the ED.  Discussed this case with Dr. Rosalia Hammers. MDM  32 y.o. female with sudden onset of left lateral lower leg pain. Patient upset because she feels like no one is doing anything to help her. States she came here because she could not control her pain at home and we are not doing any better. I discussed with the patient that the x-ray are normal and we are trying to find the cause of her pain. She states that she is leaving. Patient left without any further evaluation or insturctions.   [facility] Orlene Och, [place] 06/28/13 Ernestina [place]

## 2013-06-28 NOTE — ED Notes (Signed)
Pt got upset with staff and left against the advice of Hope, NP.

## 2013-06-28 NOTE — ED Notes (Signed)
Pt c/o "knot" to outside of left lower leg that she noticed a hour ago, denies any injury.

## 2013-06-28 NOTE — ED Provider Notes (Signed)
History/physical exam/procedure(s) were performed by non-physician practitioner and as supervising physician I was immediately available for consultation/collaboration. I have reviewed all notes and am in agreement with care and plan.   Zaleigh Bermingham S Yaffa Seckman, MD 06/28/13 2001 

## 2013-07-12 ENCOUNTER — Emergency Department (HOSPITAL_COMMUNITY)
Admission: EM | Admit: 2013-07-12 | Discharge: 2013-07-13 | Disposition: A | Discharge status: Home / Self Care | Payer: Medicaid Other | Attending: Emergency Medicine | Admitting: Emergency Medicine

## 2013-07-12 ENCOUNTER — Emergency Department (HOSPITAL_COMMUNITY): Payer: Medicaid Other

## 2013-07-12 ENCOUNTER — Encounter (HOSPITAL_COMMUNITY): Payer: Self-pay | Admitting: *Deleted

## 2013-07-12 DIAGNOSIS — Z880 Allergy status to penicillin: Secondary | ICD-10-CM | POA: Insufficient documentation

## 2013-07-12 DIAGNOSIS — F17200 Nicotine dependence, unspecified, uncomplicated: Secondary | ICD-10-CM | POA: Insufficient documentation

## 2013-07-12 DIAGNOSIS — Z8719 Personal history of other diseases of the digestive system: Secondary | ICD-10-CM | POA: Insufficient documentation

## 2013-07-12 DIAGNOSIS — Z8669 Personal history of other diseases of the nervous system and sense organs: Secondary | ICD-10-CM | POA: Insufficient documentation

## 2013-07-12 DIAGNOSIS — IMO0001 Reserved for inherently not codable concepts without codable children: Secondary | ICD-10-CM | POA: Insufficient documentation

## 2013-07-12 DIAGNOSIS — Z87448 Personal history of other diseases of urinary system: Secondary | ICD-10-CM | POA: Insufficient documentation

## 2013-07-12 DIAGNOSIS — G43909 Migraine, unspecified, not intractable, without status migrainosus: Secondary | ICD-10-CM | POA: Insufficient documentation

## 2013-07-12 DIAGNOSIS — F411 Generalized anxiety disorder: Secondary | ICD-10-CM | POA: Insufficient documentation

## 2013-07-12 DIAGNOSIS — Z87442 Personal history of urinary calculi: Secondary | ICD-10-CM | POA: Insufficient documentation

## 2013-07-12 DIAGNOSIS — G8929 Other chronic pain: Secondary | ICD-10-CM | POA: Insufficient documentation

## 2013-07-12 DIAGNOSIS — Z862 Personal history of diseases of the blood and blood-forming organs and certain disorders involving the immune mechanism: Secondary | ICD-10-CM | POA: Insufficient documentation

## 2013-07-12 DIAGNOSIS — Z8639 Personal history of other endocrine, nutritional and metabolic disease: Secondary | ICD-10-CM | POA: Insufficient documentation

## 2013-07-12 DIAGNOSIS — Z3202 Encounter for pregnancy test, result negative: Secondary | ICD-10-CM | POA: Insufficient documentation

## 2013-07-12 DIAGNOSIS — N23 Unspecified renal colic: Secondary | ICD-10-CM | POA: Insufficient documentation

## 2013-07-12 DIAGNOSIS — Z79899 Other long term (current) drug therapy: Secondary | ICD-10-CM | POA: Insufficient documentation

## 2013-07-12 LAB — URINALYSIS, ROUTINE W REFLEX MICROSCOPIC
Glucose, UA: NEGATIVE mg/dL
pH: 6 (ref 5.0–8.0)

## 2013-07-12 LAB — URINE MICROSCOPIC-ADD ON

## 2013-07-12 MED ORDER — KETOROLAC TROMETHAMINE 15 MG/ML IJ SOLN
15.0000 mg | Freq: Once | INTRAMUSCULAR | Status: AC
  Administered 2013-07-12: 15 mg via INTRAVENOUS
  Filled 2013-07-12: qty 1

## 2013-07-12 MED ORDER — HYDROMORPHONE HCL PF 1 MG/ML IJ SOLN
1.0000 mg | Freq: Once | INTRAMUSCULAR | Status: AC
  Administered 2013-07-12: 1 mg via INTRAVENOUS
  Filled 2013-07-12: qty 1

## 2013-07-12 MED ORDER — SODIUM CHLORIDE 0.9 % IV SOLN
INTRAVENOUS | Status: DC
  Administered 2013-07-12: 23:00:00 via INTRAVENOUS

## 2013-07-12 MED ORDER — KETOROLAC TROMETHAMINE 30 MG/ML IJ SOLN
INTRAMUSCULAR | Status: AC
  Filled 2013-07-12: qty 1

## 2013-07-12 MED ORDER — ONDANSETRON HCL 4 MG/2ML IJ SOLN
4.0000 mg | Freq: Once | INTRAMUSCULAR | Status: AC
  Administered 2013-07-12: 4 mg via INTRAVENOUS
  Filled 2013-07-12: qty 2

## 2013-07-12 NOTE — ED Provider Notes (Signed)
CSN: [id number]     Arrival date & time 07/12/13  2236 History    This chart was scribed for Hanley Seamen, MD, by Yevette Edwards, ED Scribe. This patient was seen in room [facility]12/[facility]12 and the patient's care was started at 11:02 PM.   First MD Initiated Contact with Patient 07/12/13 2301     Chief Complaint  Patient presents with  . Flank Pain    The history is provided by the patient. No language interpreter was used.   HPI Comments: Isabella Buchanan is a 32 y.o. female, with a h/o kidney calculi, who presents to the Emergency Department complaining of non-radiating left-sided flank pain which began two hours ago. She has experienced nausea as an associated symptom. The pt denies any fever, chills, or emesis. Her last episode of kidney calculi was several years ago.  Past Medical History  Diagnosis Date  . Anxiety   . Fibromyalgia   . Restless leg   . Chronic back pain   . GERD (gastroesophageal reflux disease)   . Headache(784.0)     mygrains  . PONV (postoperative nausea and vomiting)   . Interstitial cystitis   . Polycystic ovarian syndrome   . Kidney stones     interstitual cystitis  . Nephrolithiasis    Past Surgical History  Procedure Laterality Date  . Cholecystectomy    . Tonsillectomy    . Tubal ligation    . Tympanoplasty     Family History  Problem Relation Age of Onset  . Hypertension Father   . Diabetes Father   . Cancer Other   . Diabetes Other    History  Substance Use Topics  . Smoking status: Current Every Day Smoker -- 1.00 packs/day for 15 years    Types: Cigarettes  . Smokeless tobacco: Never Used  . Alcohol Use: No   OB History   Grav Para Term Preterm Abortions TAB SAB Ect Mult Living   2 2  2      2      Review of Systems  A complete 10 system review of systems was obtained, and all systems were negative except where indicated in the HPI and PE.   Allergies  Omnicef; Codeine; Sulfa antibiotics; Amoxicillin; and Penicillins  Home  Medications   Current Outpatient Rx  Name  Route  Sig  Dispense  Refill  . carisoprodol (SOMA) 350 MG tablet   Oral   Take 350 mg by mouth daily.          . clonazePAM (KLONOPIN) 1 MG tablet   Oral   Take 1 mg by mouth daily as needed for anxiety. For anxiety         . diphenhydrAMINE (BENADRYL) 25 MG tablet   Oral   Take 50 mg by mouth daily as needed for allergies.         Marland Kitchen HYDROcodone-acetaminophen (NORCO) 10-325 MG per tablet   Oral   Take 1 tablet by mouth every 3 (three) hours as needed.          Marland Kitchen ibuprofen (ADVIL,MOTRIN) 200 MG tablet   Oral   Take 200 mg by mouth every 6 (six) hours as needed for pain.         Marland Kitchen ondansetron (ZOFRAN) 8 MG tablet   Oral   Take 8 mg by mouth every 8 (eight) hours as needed for nausea.         Marland Kitchen zolpidem (AMBIEN) 10 MG tablet   Oral   Take  10 mg by mouth at bedtime as needed. Sleep.          Triage Vitals: BP 132/103  Pulse 89  Temp(Src) 98.4 F (36.9 C) (Oral)  Resp 16  Ht 5\' 6"  (1.676 m)  Wt 186 lb (84.369 kg)  BMI 30.04 kg/m2  SpO2 99%  LMP 06/22/2013  Physical Exam  Nursing note and vitals reviewed. Constitutional: She is oriented to person, place, and time. She appears well-developed and well-nourished. No distress.  HENT:  Head: Normocephalic and atraumatic.  Eyes: Conjunctivae and EOM are normal. Pupils are equal, round, and reactive to light.  Neck: Neck supple. No tracheal deviation present.  Cardiovascular: Normal rate, regular rhythm and normal heart sounds.   No murmur heard. Pulmonary/Chest: Effort normal. No respiratory distress.  Abdominal: Soft. Bowel sounds are normal. She exhibits no distension. There is no tenderness.  Genitourinary:  Urine is grossly bloody.  Musculoskeletal: Normal range of motion. She exhibits tenderness.  Left CVA tenderness.  Neurological: She is alert and oriented to person, place, and time.  Skin: Skin is warm and dry.  Psychiatric: She has a normal mood and  affect. Her behavior is normal.    ED Course   DIAGNOSTIC STUDIES:  Oxygen Saturation is 99% on room air, normal by my interpretation.    COORDINATION OF CARE:  11:04 PM- Discussed treatment plan with patient, and the patient agreed to the plan.   Procedures (including critical care time)   MDM   Nursing notes and vitals signs, including pulse oximetry, reviewed.  Summary of this visit's results, reviewed by myself:  Labs:  Results for orders placed during the hospital encounter of 07/12/13 (from the past 24 hour(s))  URINALYSIS, ROUTINE W REFLEX MICROSCOPIC     Status: Abnormal   Collection Time    07/12/13 11:08 PM      Result Value Range   Color, Urine YELLOW  YELLOW   APPearance CLEAR  CLEAR   Specific Gravity, Urine >1.030 (*) 1.005 - 1.030   pH 6.0  5.0 - 8.0   Glucose, UA NEGATIVE  NEGATIVE mg/dL   Hgb urine dipstick TRACE (*) NEGATIVE   Bilirubin Urine SMALL (*) NEGATIVE   Ketones, ur TRACE (*) NEGATIVE mg/dL   Protein, ur TRACE (*) NEGATIVE mg/dL   Urobilinogen, UA 0.2  0.0 - 1.0 mg/dL   Nitrite NEGATIVE  NEGATIVE   Leukocytes, UA NEGATIVE  NEGATIVE  PREGNANCY, URINE     Status: None   Collection Time    07/12/13 11:08 PM      Result Value Range   Preg Test, Ur NEGATIVE  NEGATIVE  URINE MICROSCOPIC-ADD ON     Status: Abnormal   Collection Time    07/12/13 11:08 PM      Result Value Range   Squamous Epithelial / LPF MANY (*) RARE   WBC, UA 0-2  <3 WBC/hpf   RBC / HPF 3-6  <3 RBC/hpf   Bacteria, UA MANY (*) RARE   Crystals CA OXALATE CRYSTALS (*) NEGATIVE    Imaging Studies: Ct Abdomen Pelvis Wo Contrast  07/13/2013   *RADIOLOGY REPORT*  Clinical Data: Left flank pain.  CT ABDOMEN AND PELVIS WITHOUT CONTRAST  Technique:  Multidetector CT imaging of the abdomen and pelvis was performed following the standard protocol without intravenous contrast.  Comparison: 06/03/2012 and 03/13/2012  Findings: Lung bases are within normal. There is no significant  change in a somewhat ovoid 2.4 x 1.2 cm low density structure adjacent the right lateral  aspect of the distal esophagus.  Abdominal images demonstrate evidence of a prior cholecystectomy. There is a normal liver, spleen, pancreas and adrenal glands. Kidneys are normal in size with to renal stones bilaterally with the largest measuring 4 mm over the lower pole of the right kidney and 4 mm over the midpole of the left kidney.  There is no hydronephrosis and no perinephric inflammation/fluid.  The ureters are within normal without evidence of stones.  The appendix is normal.  Pelvic images demonstrate a normal bladder, uterus, ovaries and bowel.  IMPRESSION: Bilateral nephrolithiasis.  No hydronephrosis or ureteral stones.   No acute findings in the abdomen/pelvis.   Original Report Authenticated By: Elberta Fortis, M.D.   12:22 AM Pain is improved after IV medications but not completely abated. The patient likely passed a stone recently and is still experiencing ureteral spasm.    I personally performed the services described in this documentation, which was scribed in my presence.  The recorded information has been reviewed and is accurate.   Hanley Seamen, MD 07/13/13 [phone]

## 2013-07-12 NOTE — ED Notes (Signed)
Pt c/o left sided flank pain and nausea. Pt has a hx of kidney stones. Pt started hurting 2 hrs ago.

## 2013-07-13 MED ORDER — ONDANSETRON HCL 8 MG PO TABS: 8.0000 mg | ORAL_TABLET | Freq: Three times a day (TID) | ORAL | Status: DC | PRN

## 2013-07-13 MED ORDER — HYDROMORPHONE HCL PF 1 MG/ML IJ SOLN
0.5000 mg | Freq: Once | INTRAMUSCULAR | Status: AC
  Administered 2013-07-13: 0.5 mg via INTRAVENOUS
  Filled 2013-07-13: qty 1

## 2013-07-13 MED ORDER — HYDROMORPHONE HCL 4 MG PO TABS: 2.0000 mg | ORAL_TABLET | ORAL | Status: DC | PRN

## 2013-08-09 ENCOUNTER — Emergency Department (HOSPITAL_COMMUNITY)
Admission: EM | Admit: 2013-08-09 | Discharge: 2013-08-09 | Disposition: A | Discharge status: Home / Self Care | Payer: Medicaid Other | Attending: Emergency Medicine | Admitting: Emergency Medicine

## 2013-08-09 ENCOUNTER — Emergency Department (HOSPITAL_COMMUNITY): Payer: Medicaid Other

## 2013-08-09 ENCOUNTER — Encounter (HOSPITAL_COMMUNITY): Payer: Self-pay

## 2013-08-09 DIAGNOSIS — Z87442 Personal history of urinary calculi: Secondary | ICD-10-CM | POA: Insufficient documentation

## 2013-08-09 DIAGNOSIS — G8929 Other chronic pain: Secondary | ICD-10-CM | POA: Insufficient documentation

## 2013-08-09 DIAGNOSIS — Z79899 Other long term (current) drug therapy: Secondary | ICD-10-CM | POA: Insufficient documentation

## 2013-08-09 DIAGNOSIS — Z8639 Personal history of other endocrine, nutritional and metabolic disease: Secondary | ICD-10-CM | POA: Insufficient documentation

## 2013-08-09 DIAGNOSIS — IMO0001 Reserved for inherently not codable concepts without codable children: Secondary | ICD-10-CM | POA: Insufficient documentation

## 2013-08-09 DIAGNOSIS — R569 Unspecified convulsions: Secondary | ICD-10-CM

## 2013-08-09 DIAGNOSIS — R5381 Other malaise: Secondary | ICD-10-CM | POA: Insufficient documentation

## 2013-08-09 DIAGNOSIS — R51 Headache: Secondary | ICD-10-CM | POA: Insufficient documentation

## 2013-08-09 DIAGNOSIS — Z8719 Personal history of other diseases of the digestive system: Secondary | ICD-10-CM | POA: Insufficient documentation

## 2013-08-09 DIAGNOSIS — Z862 Personal history of diseases of the blood and blood-forming organs and certain disorders involving the immune mechanism: Secondary | ICD-10-CM | POA: Insufficient documentation

## 2013-08-09 DIAGNOSIS — K219 Gastro-esophageal reflux disease without esophagitis: Secondary | ICD-10-CM | POA: Insufficient documentation

## 2013-08-09 DIAGNOSIS — Z880 Allergy status to penicillin: Secondary | ICD-10-CM | POA: Insufficient documentation

## 2013-08-09 DIAGNOSIS — F17200 Nicotine dependence, unspecified, uncomplicated: Secondary | ICD-10-CM | POA: Insufficient documentation

## 2013-08-09 HISTORY — DX: Diaphragmatic hernia without obstruction or gangrene: K44.9

## 2013-08-09 LAB — RAPID URINE DRUG SCREEN, HOSP PERFORMED
Amphetamines: NOT DETECTED
Barbiturates: NOT DETECTED
Benzodiazepines: NOT DETECTED
Tetrahydrocannabinol: NOT DETECTED

## 2013-08-09 LAB — CBC WITH DIFFERENTIAL/PLATELET
Basophils Absolute: 0.1 10*3/uL (ref 0.0–0.1)
Eosinophils Relative: 2 % (ref 0–5)
Lymphocytes Relative: 17 % (ref 12–46)
Neutro Abs: 11.6 10*3/uL — ABNORMAL HIGH (ref 1.7–7.7)
Platelets: 277 10*3/uL (ref 150–400)
RDW: 13.5 % (ref 11.5–15.5)
WBC: 15.4 10*3/uL — ABNORMAL HIGH (ref 4.0–10.5)

## 2013-08-09 LAB — COMPREHENSIVE METABOLIC PANEL
ALT: 11 U/L (ref 0–35)
AST: 13 U/L (ref 0–37)
CO2: 29 mEq/L (ref 19–32)
Calcium: 9.6 mg/dL (ref 8.4–10.5)
GFR calc non Af Amer: >90 mL/min (ref >90)
Sodium: 133 mEq/L — ABNORMAL LOW (ref 135–145)

## 2013-08-09 NOTE — ED Notes (Signed)
Pt alert & oriented x4, stable gait. Patient given discharge instructions, paperwork & prescription(s). Patient  instructed to stop at the registration desk to finish any additional paperwork. Patient verbalized understanding. Pt left department w/ no further questions. 

## 2013-08-09 NOTE — ED Notes (Signed)
She was sitting in the recliner and had a seizure, lasted around 3 minutes. Patient states that she has not ever had a seizure before. She was alert and oriented on arrival of EMS. She walked to the stretcher, answered questions appropriately. Blood sugar was 115. Feeling tired, weak, and c/o of headache now.

## 2013-08-13 NOTE — ED Provider Notes (Signed)
CSN: [id number]     Arrival date & time 08/09/13  1945 History   First MD Initiated Contact with Patient 08/09/13 1946     Chief Complaint  Patient presents with  . Seizures  . Weakness  . Headache   (Consider location/radiation/quality/duration/timing/severity/associated sxs/prior Treatment) Patient is a 32 y.o. female presenting with seizures, weakness, and headaches. The history is provided by the spouse (the pt had a seizure today.  lasted one minute.  no hx of sz).  Seizures Seizure activity on arrival: no   Seizure type:  Grand mal Preceding symptoms: no sensation of an aura present   Initial focality:  None Episode characteristics: no incontinence   Weakness Associated symptoms include headaches. Pertinent negatives include no chest pain and no abdominal pain.  Headache Associated symptoms: seizures   Associated symptoms: no abdominal pain, no back pain, no congestion, no cough, no diarrhea, no fatigue and no sinus pressure     Past Medical History  Diagnosis Date  . Anxiety   . Fibromyalgia   . Restless leg   . Chronic back pain   . GERD (gastroesophageal reflux disease)   . Headache(784.0)     mygrains  . PONV (postoperative nausea and vomiting)   . Interstitial cystitis   . Polycystic ovarian syndrome   . Kidney stones     interstitual cystitis  . Nephrolithiasis   . Hiatal hernia    Past Surgical History  Procedure Laterality Date  . Cholecystectomy    . Tonsillectomy    . Tubal ligation    . Tympanoplasty    . Esophagogastroduodenoscopy endoscopy     Family History  Problem Relation Age of Onset  . Hypertension Father   . Diabetes Father   . Cancer Other   . Diabetes Other    History  Substance Use Topics  . Smoking status: Current Every Day Smoker -- 1.00 packs/day for 15 years    Types: Cigarettes  . Smokeless tobacco: Never Used  . Alcohol Use: No   OB History   Grav Para Term Preterm Abortions TAB SAB Ect Mult Living   2 2  2      2       Review of Systems  Constitutional: Negative for appetite change and fatigue.  HENT: Negative for congestion, sinus pressure and ear discharge.   Eyes: Negative for discharge.  Respiratory: Negative for cough.   Cardiovascular: Negative for chest pain.  Gastrointestinal: Negative for abdominal pain and diarrhea.  Genitourinary: Negative for frequency and hematuria.  Musculoskeletal: Negative for back pain.  Skin: Negative for rash.  Neurological: Positive for seizures, weakness and headaches.  Psychiatric/Behavioral: Negative for hallucinations.    Allergies  Omnicef; Codeine; Sulfa antibiotics; Amoxicillin; and Penicillins  Home Medications   Current Outpatient Rx  Name  Route  Sig  Dispense  Refill  . Camphor-Menthol-Methyl Sal (SALONPAS) 1.2-5.7-6.3 % PT[facility]   Apply externally   Apply 1 patch topically daily as needed (for pain).         . carisoprodol (SOMA) 350 MG tablet   Oral   Take 350 mg by mouth daily.          . clonazePAM (KLONOPIN) 1 MG tablet   Oral   Take 1 mg by mouth daily as needed for anxiety. For anxiety         . diphenhydrAMINE (BENADRYL) 25 MG tablet   Oral   Take 50 mg by mouth daily as needed for allergies.         Marland Kitchen  furosemide (LASIX) 40 MG tablet   Oral   Take 40 mg by mouth daily.         Marland Kitchen HYDROcodone-acetaminophen (NORCO) 10-325 MG per tablet   Oral   Take 1 tablet by mouth every 3 (three) hours as needed.          Marland Kitchen ibuprofen (ADVIL,MOTRIN) 200 MG tablet   Oral   Take 200 mg by mouth every 6 (six) hours as needed for pain.         Marland Kitchen omeprazole (PRILOSEC) 20 MG capsule   Oral   Take 20 mg by mouth 2 (two) times daily.         . ondansetron (ZOFRAN) 8 MG tablet   Oral   Take 1 tablet (8 mg total) by mouth every 8 (eight) hours as needed for nausea.   10 tablet   0   . pregabalin (LYRICA) 75 MG capsule   Oral   Take 75 mg by mouth 2 (two) times daily.         Marland Kitchen zolpidem (AMBIEN) 10 MG tablet   Oral   Take  10 mg by mouth at bedtime as needed. Sleep.          BP 136/97  Pulse 102  Temp(Src) 99.3 F (37.4 C) (Oral)  Resp 16  Ht 5\' 6"  (1.676 m)  Wt 190 lb (86.183 kg)  BMI 30.68 kg/m2  SpO2 94%  LMP 08/05/2013 Physical Exam  Constitutional: She is oriented to person, place, and time. She appears well-developed.  HENT:  Head: Normocephalic.  Eyes: Conjunctivae and EOM are normal. No scleral icterus.  Neck: Neck supple. No thyromegaly present.  Cardiovascular: Normal rate and regular rhythm.  Exam reveals no gallop and no friction rub.   No murmur heard. Pulmonary/Chest: No stridor. She has no wheezes. She has no rales. She exhibits no tenderness.  Abdominal: She exhibits no distension. There is no tenderness. There is no rebound.  Musculoskeletal: Normal range of motion. She exhibits no edema.  Lymphadenopathy:    She has no cervical adenopathy.  Neurological: She is oriented to person, place, and time. No cranial nerve deficit. Coordination normal.  Skin: No rash noted. No erythema.  Psychiatric: She has a normal mood and affect. Her behavior is normal.    ED Course  Procedures (including critical care time) Labs Review Labs Reviewed  CBC WITH DIFFERENTIAL - Abnormal; Notable for the following:    WBC 15.4 (*)    RBC 5.18 (*)    Neutro Abs 11.6 (*)    All other components within normal limits  COMPREHENSIVE METABOLIC PANEL - Abnormal; Notable for the following:    Sodium 133 (*)    Chloride 94 (*)    Glucose, Bld 113 (*)    Total Bilirubin <0.1 (*)    All other components within normal limits  URINE RAPID DRUG SCREEN (HOSP PERFORMED) - Abnormal; Notable for the following:    Opiates POSITIVE (*)    All other components within normal limits   Imaging Review No results found.  MDM   1. Seizure        [name], MD 08/13/13 1322

## 2013-08-28 ENCOUNTER — Other Ambulatory Visit: Payer: Self-pay | Admitting: Neurology

## 2013-08-28 DIAGNOSIS — G40909 Epilepsy, unspecified, not intractable, without status epilepticus: Secondary | ICD-10-CM

## 2013-09-04 ENCOUNTER — Ambulatory Visit (HOSPITAL_COMMUNITY)
Admission: RE | Admit: 2013-09-04 | Discharge: 2013-09-04 | Disposition: A | Discharge status: Home / Self Care | Payer: Medicaid Other | Source: Ambulatory Visit | Attending: Neurology | Admitting: Neurology

## 2013-09-04 DIAGNOSIS — R51 Headache: Secondary | ICD-10-CM | POA: Insufficient documentation

## 2013-09-04 DIAGNOSIS — R569 Unspecified convulsions: Secondary | ICD-10-CM | POA: Insufficient documentation

## 2013-09-04 DIAGNOSIS — G40909 Epilepsy, unspecified, not intractable, without status epilepticus: Secondary | ICD-10-CM

## 2013-09-04 MED ORDER — GADOBENATE DIMEGLUMINE 529 MG/ML IV SOLN
18.0000 mL | Freq: Once | INTRAVENOUS | Status: AC | PRN
  Administered 2013-09-04: 18 mL via INTRAVENOUS

## 2013-10-14 ENCOUNTER — Encounter (HOSPITAL_COMMUNITY): Payer: Self-pay | Admitting: Emergency Medicine

## 2013-10-14 ENCOUNTER — Emergency Department (HOSPITAL_COMMUNITY)
Admission: EM | Admit: 2013-10-14 | Discharge: 2013-10-14 | Disposition: A | Discharge status: Home / Self Care | Payer: Medicaid Other | Attending: Emergency Medicine | Admitting: Emergency Medicine

## 2013-10-14 DIAGNOSIS — R5381 Other malaise: Secondary | ICD-10-CM | POA: Insufficient documentation

## 2013-10-14 DIAGNOSIS — Z79899 Other long term (current) drug therapy: Secondary | ICD-10-CM | POA: Insufficient documentation

## 2013-10-14 DIAGNOSIS — H9209 Otalgia, unspecified ear: Secondary | ICD-10-CM | POA: Insufficient documentation

## 2013-10-14 DIAGNOSIS — Z880 Allergy status to penicillin: Secondary | ICD-10-CM | POA: Insufficient documentation

## 2013-10-14 DIAGNOSIS — J0100 Acute maxillary sinusitis, unspecified: Secondary | ICD-10-CM | POA: Insufficient documentation

## 2013-10-14 MED ORDER — FEXOFENADINE-PSEUDOEPHED ER 60-120 MG PO TB12: 1.0000 | ORAL_TABLET | Freq: Two times a day (BID) | ORAL | Status: DC

## 2013-10-14 MED ORDER — AZITHROMYCIN 250 MG PO TABS: ORAL_TABLET | ORAL | Status: DC

## 2013-10-14 NOTE — ED Notes (Signed)
Pt c/o sore throat, headaches, congestion with yellow sputum, right ear pain x 2 weeks. Nad.

## 2013-10-16 NOTE — ED Provider Notes (Signed)
CSN: [id number]     Arrival date & time 10/14/13  1005 History   First MD Initiated Contact with Patient 10/14/13 1043     Chief Complaint  Patient presents with  . Sore Throat  . Headache   (Consider location/radiation/quality/duration/timing/severity/associated sxs/prior Treatment) HPI Comments: Isabella Buchanan is a 32 y.o. Female presenting with nearly a 2 week history of uri type symptoms which includes nasal congestion with thick yellow and sometimes blood streaked discharge, sore throat,subjective low grade fever, right earache, headache and nonproductive cough.  Symptoms due to not include shortness of breath, chest pain,  Nausea, vomiting or diarrhea.  The patient has taken benadryl and advil prior to arrival with no significant improvement in symptoms.      The history is provided by the patient.    Past Medical History  Diagnosis Date  . Anxiety   . Fibromyalgia   . Restless leg   . Chronic back pain   . GERD (gastroesophageal reflux disease)   . Headache(784.0)     mygrains  . PONV (postoperative nausea and vomiting)   . Interstitial cystitis   . Polycystic ovarian syndrome   . Kidney stones     interstitual cystitis  . Nephrolithiasis   . Hiatal hernia    Past Surgical History  Procedure Laterality Date  . Cholecystectomy    . Tonsillectomy    . Tubal ligation    . Tympanoplasty    . Esophagogastroduodenoscopy endoscopy     Family History  Problem Relation Age of Onset  . Hypertension Father   . Diabetes Father   . Cancer Other   . Diabetes Other    History  Substance Use Topics  . Smoking status: Current Every Day Smoker -- 1.00 packs/day for 15 years    Types: Cigarettes  . Smokeless tobacco: Never Used  . Alcohol Use: No   OB History   Grav Para Term Preterm Abortions TAB SAB Ect Mult Living   2 2  2      2      Review of Systems  Constitutional: Positive for fever and fatigue.  HENT: Positive for congestion, ear pain, postnasal drip,  rhinorrhea, sinus pressure and sore throat. Negative for trouble swallowing and voice change.   Eyes: Negative for discharge.  Respiratory: Positive for cough. Negative for shortness of breath, wheezing and stridor.   Cardiovascular: Negative for chest pain.  Gastrointestinal: Negative for abdominal pain.  Genitourinary: Negative.   Neurological: Positive for headaches. Negative for dizziness.    Allergies  Omnicef; Codeine; Sulfa antibiotics; Amoxicillin; and Penicillins  Home Medications   Current Outpatient Rx  Name  Route  Sig  Dispense  Refill  . Camphor-Menthol-Methyl Sal (SALONPAS) 1.2-5.7-6.3 % [facility]   Apply externally   Apply 1 patch topically daily as needed (for pain).         . carisoprodol (SOMA) 350 MG tablet   Oral   Take 350 mg by mouth daily.          . clonazePAM (KLONOPIN) 1 MG tablet   Oral   Take 1 mg by mouth daily as needed for anxiety. For anxiety         . dexlansoprazole (DEXILANT) 60 MG capsule   Oral   Take 60 mg by mouth daily.         . diphenhydrAMINE (BENADRYL) 25 MG tablet   Oral   Take 50 mg by mouth daily as needed for allergies.         Marland Kitchen  escitalopram (LEXAPRO) 10 MG tablet   Oral   Take 10 mg by mouth daily.         Marland Kitchen ibuprofen (ADVIL,MOTRIN) 200 MG tablet   Oral   Take 200 mg by mouth every 6 (six) hours as needed for pain.         Marland Kitchen oxyCODONE-acetaminophen (PERCOCET) 10-325 MG per tablet   Oral   Take 1 tablet by mouth every 6 (six) hours as needed for pain.         . pregabalin (LYRICA) 75 MG capsule   Oral   Take 75 mg by mouth 2 (two) times daily.         Marland Kitchen topiramate (TOPAMAX) 50 MG tablet   Oral   Take 50 mg by mouth daily.         Marland Kitchen azithromycin (ZITHROMAX Z-PAK) 250 MG tablet      Take 2 tablets by mouth on day one followed by one tablet daily for 4 days.   6 tablet   0   . fexofenadine-pseudoephedrine (ALLEGRA-D) 60-120 MG per tablet   Oral   Take 1 tablet by mouth every 12 (twelve)  hours.   30 tablet   0   . ondansetron (ZOFRAN) 8 MG tablet   Oral   Take 1 tablet (8 mg total) by mouth every 8 (eight) hours as needed for nausea.   10 tablet   0    BP 118/75  Pulse 82  Temp(Src) 98.4 F (36.9 C) (Oral)  Resp 19  SpO2 99%  LMP 10/06/2013 Physical Exam  Constitutional: She is oriented to person, place, and time. She appears well-developed and well-nourished.  HENT:  Head: Normocephalic and atraumatic.  Right Ear: Tympanic membrane and ear canal normal.  Left Ear: Tympanic membrane and ear canal normal.  Nose: Mucosal edema and rhinorrhea present. Right sinus exhibits maxillary sinus tenderness.  Mouth/Throat: Uvula is midline, oropharynx is clear and moist and mucous membranes are normal. No oropharyngeal exudate, posterior oropharyngeal edema, posterior oropharyngeal erythema or tonsillar abscesses.  Thick pnd present.  Eyes: Conjunctivae are normal.  Cardiovascular: Normal rate and normal heart sounds.   Pulmonary/Chest: Effort normal. No respiratory distress. She has no wheezes. She has no rales.  Abdominal: Soft. There is no tenderness.  Musculoskeletal: Normal range of motion.  Neurological: She is alert and oriented to person, place, and time.  Skin: Skin is warm and dry. No rash noted.  Psychiatric: She has a normal mood and affect.    ED Course  Procedures (including critical care time) Labs Review Labs Reviewed - No data to display Imaging Review No results found.  EKG Interpretation   None       MDM   1. Sinusitis, acute maxillary    Exam c/w acute sinusitis.  Pt was prescribed amoxil, allegra d for congestion.  Encouraged increased fluids, motrin for pain reduction, warm facial compresses, advised smoking cessation. F/u with pcp for recheck if not improved over the next week.    Burgess Amor, PA-C 10/16/13 1138

## 2013-10-17 NOTE — ED Provider Notes (Signed)
Medical screening examination/treatment/procedure(s) were performed by non-physician practitioner and as supervising physician I was immediately available for consultation/collaboration.  EKG Interpretation   None         Laray Anger, DO 10/17/13 1046

## 2013-11-18 ENCOUNTER — Encounter (HOSPITAL_COMMUNITY): Payer: Self-pay | Admitting: Emergency Medicine

## 2013-11-18 ENCOUNTER — Emergency Department (HOSPITAL_COMMUNITY)
Admission: EM | Admit: 2013-11-18 | Discharge: 2013-11-18 | Disposition: A | Discharge status: Home / Self Care | Payer: Medicaid Other | Attending: Emergency Medicine | Admitting: Emergency Medicine

## 2013-11-18 ENCOUNTER — Emergency Department (HOSPITAL_COMMUNITY): Payer: Medicaid Other

## 2013-11-18 DIAGNOSIS — Z87442 Personal history of urinary calculi: Secondary | ICD-10-CM | POA: Insufficient documentation

## 2013-11-18 DIAGNOSIS — Z8742 Personal history of other diseases of the female genital tract: Secondary | ICD-10-CM | POA: Insufficient documentation

## 2013-11-18 DIAGNOSIS — Z8739 Personal history of other diseases of the musculoskeletal system and connective tissue: Secondary | ICD-10-CM | POA: Insufficient documentation

## 2013-11-18 DIAGNOSIS — G8929 Other chronic pain: Secondary | ICD-10-CM | POA: Insufficient documentation

## 2013-11-18 DIAGNOSIS — Z79899 Other long term (current) drug therapy: Secondary | ICD-10-CM | POA: Insufficient documentation

## 2013-11-18 DIAGNOSIS — Z880 Allergy status to penicillin: Secondary | ICD-10-CM | POA: Insufficient documentation

## 2013-11-18 DIAGNOSIS — J029 Acute pharyngitis, unspecified: Secondary | ICD-10-CM | POA: Insufficient documentation

## 2013-11-18 DIAGNOSIS — J40 Bronchitis, not specified as acute or chronic: Secondary | ICD-10-CM

## 2013-11-18 DIAGNOSIS — Z9089 Acquired absence of other organs: Secondary | ICD-10-CM | POA: Insufficient documentation

## 2013-11-18 DIAGNOSIS — F411 Generalized anxiety disorder: Secondary | ICD-10-CM | POA: Insufficient documentation

## 2013-11-18 DIAGNOSIS — F17200 Nicotine dependence, unspecified, uncomplicated: Secondary | ICD-10-CM

## 2013-11-18 DIAGNOSIS — G40909 Epilepsy, unspecified, not intractable, without status epilepticus: Secondary | ICD-10-CM | POA: Insufficient documentation

## 2013-11-18 DIAGNOSIS — K219 Gastro-esophageal reflux disease without esophagitis: Secondary | ICD-10-CM | POA: Insufficient documentation

## 2013-11-18 DIAGNOSIS — G43909 Migraine, unspecified, not intractable, without status migrainosus: Secondary | ICD-10-CM | POA: Insufficient documentation

## 2013-11-18 DIAGNOSIS — R52 Pain, unspecified: Secondary | ICD-10-CM | POA: Insufficient documentation

## 2013-11-18 HISTORY — DX: Unspecified convulsions: R56.9

## 2013-11-18 MED ORDER — BENZONATATE 100 MG PO CAPS
200.0000 mg | ORAL_CAPSULE | Freq: Once | ORAL | Status: AC
  Administered 2013-11-18: 200 mg via ORAL
  Filled 2013-11-18: qty 2

## 2013-11-18 MED ORDER — BENZONATATE 200 MG PO CAPS: 200.0000 mg | ORAL_CAPSULE | Freq: Three times a day (TID) | ORAL | Status: DC | PRN

## 2013-11-18 NOTE — ED Provider Notes (Signed)
CSN: [id number]     Arrival date & time 11/18/13  0911 History   First MD Initiated Contact with Patient 11/18/13 0932     Chief Complaint  Patient presents with  . Influenza   (Consider location/radiation/quality/duration/timing/severity/associated sxs/prior Treatment) HPI Comments: Isabella Buchanan is a 32 y.o. Female presenting with an almost 3 week history of nonproductive cough,  Burning chest pain with coughing, nasal congestion, sore throat, body aches and subjective fevers.  She was treated for a sinusitis with antibiotics one month ago and felt better for a couple of weeks after this treatment.  She denies sinus pain or pressure with today's symptoms.  She continues to smoke cigarettes.  Her symptoms are worse at night, especially the coughing.  She denies shortness of breath and wheezing.  Other negatives include nausea, vomiting, abdominal pain.     The history is provided by the patient.    Past Medical History  Diagnosis Date  . Anxiety   . Fibromyalgia   . Restless leg   . Chronic back pain   . GERD (gastroesophageal reflux disease)   . Headache(784.0)     mygrains  . PONV (postoperative nausea and vomiting)   . Interstitial cystitis   . Polycystic ovarian syndrome   . Kidney stones     interstitual cystitis  . Nephrolithiasis   . Hiatal hernia   . Seizures    Past Surgical History  Procedure Laterality Date  . Cholecystectomy    . Tonsillectomy    . Tubal ligation    . Tympanoplasty    . Esophagogastroduodenoscopy endoscopy     Family History  Problem Relation Age of Onset  . Hypertension Father   . Diabetes Father   . Cancer Other   . Diabetes Other    History  Substance Use Topics  . Smoking status: Current Every Day Smoker -- 1.00 packs/day for 15 years    Types: Cigarettes  . Smokeless tobacco: Never Used  . Alcohol Use: No   OB History   Grav Para Term Preterm Abortions TAB SAB Ect Mult Living   2 2  2      2      Review of Systems   Constitutional: Positive for fever and chills.  HENT: Positive for congestion. Negative for ear pain, rhinorrhea, sinus pressure, sore throat, trouble swallowing and voice change.   Eyes: Negative for discharge.  Respiratory: Positive for cough and chest tightness. Negative for shortness of breath, wheezing and stridor.   Cardiovascular: Negative for chest pain.  Gastrointestinal: Negative for abdominal pain.  Genitourinary: Negative.     Allergies  Omnicef; Codeine; Sulfa antibiotics; Amoxicillin; and Penicillins  Home Medications   Current Outpatient Rx  Name  Route  Sig  Dispense  Refill  . Camphor-Menthol-Methyl Sal (SALONPAS) 1.2-5.7-6.3 % PTCH   Apply externally   Apply 1 patch topically daily as needed (for pain).         . carisoprodol (SOMA) 350 MG tablet   Oral   Take 350 mg by mouth daily.          . clonazePAM (KLONOPIN) 1 MG tablet   Oral   Take 1 mg by mouth daily as needed for anxiety. For anxiety         . dexlansoprazole (DEXILANT) 60 MG capsule   Oral   Take 60 mg by mouth daily.         . diphenhydrAMINE (BENADRYL) 25 MG tablet   Oral   Take 50 mg  by mouth daily as needed for allergies.         Marland Kitchen escitalopram (LEXAPRO) 10 MG tablet   Oral   Take 10 mg by mouth daily.         Marland Kitchen HYDROcodone-acetaminophen (NORCO) 10-325 MG per tablet   Oral   Take 1 tablet by mouth 3 (three) times daily.         Marland Kitchen ibuprofen (ADVIL,MOTRIN) 200 MG tablet   Oral   Take 800 mg by mouth every 6 (six) hours as needed for pain.          Marland Kitchen ondansetron (ZOFRAN) 8 MG tablet   Oral   Take 8 mg by mouth daily as needed for nausea or vomiting.         . pramipexole (MIRAPEX) 1 MG tablet   Oral   Take 1 mg by mouth at bedtime.         . pregabalin (LYRICA) 75 MG capsule   Oral   Take 75 mg by mouth 2 (two) times daily.         Marland Kitchen topiramate (TOPAMAX) 50 MG tablet   Oral   Take 50 mg by mouth daily.         . benzonatate (TESSALON) 200 MG  capsule   Oral   Take 1 capsule (200 mg total) by mouth 3 (three) times daily as needed for cough.   20 capsule   0    BP 138/86  Pulse 95  Temp(Src) 98.3 F (36.8 C) (Oral)  Resp 20  Ht 5\' 6"  (1.676 m)  Wt 205 lb (92.987 kg)  BMI 33.10 kg/m2  SpO2 98% Physical Exam  Constitutional: She is oriented to person, place, and time. She appears well-developed and well-nourished.  HENT:  Head: Normocephalic and atraumatic.  Right Ear: Tympanic membrane and ear canal normal.  Left Ear: Tympanic membrane and ear canal normal.  Nose: Mucosal edema and rhinorrhea present.  Mouth/Throat: Uvula is midline, oropharynx is clear and moist and mucous membranes are normal. No oropharyngeal exudate, posterior oropharyngeal edema, posterior oropharyngeal erythema or tonsillar abscesses.  Eyes: Conjunctivae are normal.  Neck: Normal range of motion.  Cardiovascular: Normal rate and normal heart sounds.   Pulmonary/Chest: Effort normal. No respiratory distress. She has no wheezes. She has no rales.  Frequent cough, dry  Abdominal: Soft. There is no tenderness.  Musculoskeletal: Normal range of motion.  Lymphadenopathy:    She has no cervical adenopathy.  Neurological: She is alert and oriented to person, place, and time.  Skin: Skin is warm and dry. No rash noted.  Psychiatric: She has a normal mood and affect.    ED Course  Procedures (including critical care time) Labs Review Labs Reviewed - No data to display Imaging Review Dg Chest 2 View  11/18/2013   CLINICAL DATA:  Cough for 3 weeks duration.  EXAM: CHEST  2 VIEW  COMPARISON:  11/26/2012  FINDINGS: Heart size is normal. Mediastinal shadows are normal. There is central bronchial thickening but no infiltrate, collapse or effusion. No bony abnormality.  IMPRESSION: Bronchitis.  No consolidation or collapse.   Electronically Signed   By: Paulina Fusi M.D.   On: 11/18/2013 10:41    EKG Interpretation   None       MDM   1.  Bronchitis   2. Smoking addiction    Patients labs and/or radiological studies were viewed and considered during the medical decision making and disposition process.  Acute bronchitis per x-rays and  exam.  Patient was prescribed Tessalon Perles to help her with her cough symptoms.  She was strongly encouraged smoking cessation.  Encouraged followup with PCP if not improving over the next week.    Burgess Amor, PA-C 11/18/13 1110

## 2013-11-18 NOTE — ED Notes (Signed)
Pt alert & oriented x4, stable gait. Patient given discharge instructions, paperwork & prescription(s). Patient  instructed to stop at the registration desk to finish any additional paperwork. Patient verbalized understanding. Pt left department w/ no further questions. 

## 2013-11-18 NOTE — ED Notes (Signed)
Complain of congestion, aching and fever

## 2013-11-19 NOTE — ED Provider Notes (Signed)
Medical screening examination/treatment/procedure(s) were performed by non-physician practitioner and as supervising physician I was immediately available for consultation/collaboration.  EKG Interpretation   None         Turon Kilmer W. Polo Mcmartin, MD 11/19/13 1927 

## 2013-11-20 DIAGNOSIS — Z9289 Personal history of other medical treatment: Secondary | ICD-10-CM

## 2013-11-20 HISTORY — DX: Personal history of other medical treatment: Z92.89

## 2013-12-13 ENCOUNTER — Encounter (HOSPITAL_COMMUNITY): Payer: Self-pay | Admitting: Emergency Medicine

## 2013-12-13 ENCOUNTER — Observation Stay (HOSPITAL_COMMUNITY)
Admission: EM | Admit: 2013-12-13 | Discharge: 2013-12-13 | DRG: 101 | Disposition: A | Discharge status: Home / Self Care | Payer: Medicaid Other | Attending: Internal Medicine | Admitting: Internal Medicine

## 2013-12-13 ENCOUNTER — Inpatient Hospital Stay (HOSPITAL_COMMUNITY): Payer: Medicaid Other

## 2013-12-13 ENCOUNTER — Emergency Department (HOSPITAL_COMMUNITY): Payer: Medicaid Other

## 2013-12-13 DIAGNOSIS — Z8249 Family history of ischemic heart disease and other diseases of the circulatory system: Secondary | ICD-10-CM

## 2013-12-13 DIAGNOSIS — K219 Gastro-esophageal reflux disease without esophagitis: Secondary | ICD-10-CM | POA: Diagnosis present

## 2013-12-13 DIAGNOSIS — Z833 Family history of diabetes mellitus: Secondary | ICD-10-CM

## 2013-12-13 DIAGNOSIS — M549 Dorsalgia, unspecified: Secondary | ICD-10-CM | POA: Diagnosis present

## 2013-12-13 DIAGNOSIS — Z8719 Personal history of other diseases of the digestive system: Secondary | ICD-10-CM

## 2013-12-13 DIAGNOSIS — N3010 Interstitial cystitis (chronic) without hematuria: Secondary | ICD-10-CM

## 2013-12-13 DIAGNOSIS — IMO0001 Reserved for inherently not codable concepts without codable children: Secondary | ICD-10-CM | POA: Diagnosis present

## 2013-12-13 DIAGNOSIS — F411 Generalized anxiety disorder: Secondary | ICD-10-CM | POA: Diagnosis present

## 2013-12-13 DIAGNOSIS — G2581 Restless legs syndrome: Secondary | ICD-10-CM | POA: Diagnosis present

## 2013-12-13 DIAGNOSIS — G40802 Other epilepsy, not intractable, without status epilepticus: Principal | ICD-10-CM | POA: Diagnosis present

## 2013-12-13 DIAGNOSIS — R569 Unspecified convulsions: Secondary | ICD-10-CM | POA: Diagnosis present

## 2013-12-13 DIAGNOSIS — E282 Polycystic ovarian syndrome: Secondary | ICD-10-CM | POA: Diagnosis present

## 2013-12-13 DIAGNOSIS — M797 Fibromyalgia: Secondary | ICD-10-CM | POA: Diagnosis present

## 2013-12-13 DIAGNOSIS — Z87442 Personal history of urinary calculi: Secondary | ICD-10-CM

## 2013-12-13 DIAGNOSIS — K589 Irritable bowel syndrome without diarrhea: Secondary | ICD-10-CM | POA: Diagnosis present

## 2013-12-13 DIAGNOSIS — F17200 Nicotine dependence, unspecified, uncomplicated: Secondary | ICD-10-CM | POA: Diagnosis present

## 2013-12-13 DIAGNOSIS — G8929 Other chronic pain: Secondary | ICD-10-CM | POA: Diagnosis present

## 2013-12-13 HISTORY — DX: Irritable bowel syndrome, unspecified: K58.9

## 2013-12-13 LAB — URINALYSIS, ROUTINE W REFLEX MICROSCOPIC
Bilirubin Urine: NEGATIVE
Glucose, UA: NEGATIVE mg/dL
Ketones, ur: NEGATIVE mg/dL
Nitrite: NEGATIVE
Protein, ur: 30 mg/dL — AB
SPECIFIC GRAVITY, URINE: 1.023 (ref 1.005–1.030)
Urobilinogen, UA: 0.2 mg/dL (ref 0.0–1.0)
pH: 7.5 (ref 5.0–8.0)

## 2013-12-13 LAB — CBC
HCT: 44.4 % (ref 36.0–46.0)
Hemoglobin: 14.8 g/dL (ref 12.0–15.0)
MCH: 28.8 pg (ref 26.0–34.0)
MCHC: 33.3 g/dL (ref 30.0–36.0)
MCV: 86.4 fL (ref 78.0–100.0)
PLATELETS: 312 10*3/uL (ref 150–400)
RBC: 5.14 MIL/uL — AB (ref 3.87–5.11)
RDW: 14.9 % (ref 11.5–15.5)
WBC: 10.1 10*3/uL (ref 4.0–10.5)

## 2013-12-13 LAB — RAPID URINE DRUG SCREEN, HOSP PERFORMED
Amphetamines: NOT DETECTED
Barbiturates: NOT DETECTED
Benzodiazepines: POSITIVE — AB
Cocaine: NOT DETECTED
Opiates: POSITIVE — AB
Tetrahydrocannabinol: NOT DETECTED

## 2013-12-13 LAB — POCT I-STAT, CHEM 8
BUN: 12 mg/dL (ref 6–23)
CALCIUM ION: 1.21 mmol/L (ref 1.12–1.23)
CREATININE: 0.9 mg/dL (ref 0.50–1.10)
Chloride: 106 mEq/L (ref 96–112)
GLUCOSE: 109 mg/dL — AB (ref 70–99)
HEMATOCRIT: 41 % (ref 36.0–46.0)
HEMOGLOBIN: 13.9 g/dL (ref 12.0–15.0)
Potassium: 3.5 mEq/L — ABNORMAL LOW (ref 3.7–5.3)
Sodium: 141 mEq/L (ref 137–147)
TCO2: 23 mmol/L (ref 0–100)

## 2013-12-13 LAB — BASIC METABOLIC PANEL
BUN: 13 mg/dL (ref 6–23)
CO2: 23 meq/L (ref 19–32)
Calcium: 8.9 mg/dL (ref 8.4–10.5)
Chloride: 107 mEq/L (ref 96–112)
Creatinine, Ser: 0.72 mg/dL (ref 0.50–1.10)
Glucose, Bld: 102 mg/dL — ABNORMAL HIGH (ref 70–99)
Potassium: 4.1 mEq/L (ref 3.7–5.3)
SODIUM: 142 meq/L (ref 137–147)

## 2013-12-13 LAB — URINE MICROSCOPIC-ADD ON

## 2013-12-13 MED ORDER — ONDANSETRON HCL 4 MG/2ML IJ SOLN: 4.0000 mg | Freq: Four times a day (QID) | INTRAMUSCULAR | Status: DC | PRN

## 2013-12-13 MED ORDER — ONDANSETRON HCL 4 MG/2ML IJ SOLN
4.0000 mg | Freq: Once | INTRAMUSCULAR | Status: AC
  Administered 2013-12-13: 4 mg via INTRAVENOUS
  Filled 2013-12-13: qty 2

## 2013-12-13 MED ORDER — ACETAMINOPHEN 650 MG RE SUPP: 650.0000 mg | Freq: Four times a day (QID) | RECTAL | Status: DC | PRN

## 2013-12-13 MED ORDER — ESCITALOPRAM OXALATE 10 MG PO TABS
10.0000 mg | ORAL_TABLET | Freq: Every day | ORAL | Status: DC
  Administered 2013-12-13: 10 mg via ORAL
  Filled 2013-12-13: qty 1

## 2013-12-13 MED ORDER — HYDROCODONE-ACETAMINOPHEN 10-325 MG PO TABS: 1.0000 | ORAL_TABLET | Freq: Four times a day (QID) | ORAL | Status: DC | PRN

## 2013-12-13 MED ORDER — PRAMIPEXOLE DIHYDROCHLORIDE 1 MG PO TABS
1.0000 mg | ORAL_TABLET | Freq: Every day | ORAL | Status: DC
  Filled 2013-12-13: qty 1

## 2013-12-13 MED ORDER — IBUPROFEN 800 MG PO TABS
800.0000 mg | ORAL_TABLET | Freq: Four times a day (QID) | ORAL | Status: DC | PRN
  Filled 2013-12-13: qty 1

## 2013-12-13 MED ORDER — ALUM & MAG HYDROXIDE-SIMETH 200-200-20 MG/5ML PO SUSP: 30.0000 mL | Freq: Four times a day (QID) | ORAL | Status: DC | PRN

## 2013-12-13 MED ORDER — OXYCODONE HCL 5 MG PO TABS: 5.0000 mg | ORAL_TABLET | ORAL | Status: DC | PRN

## 2013-12-13 MED ORDER — ACETAMINOPHEN 325 MG PO TABS: 650.0000 mg | ORAL_TABLET | Freq: Four times a day (QID) | ORAL | Status: DC | PRN

## 2013-12-13 MED ORDER — PREGABALIN 75 MG PO CAPS: 75.0000 mg | ORAL_CAPSULE | Freq: Two times a day (BID) | ORAL | Status: DC | PRN

## 2013-12-13 MED ORDER — LACOSAMIDE 50 MG PO TABS: 50.0000 mg | ORAL_TABLET | Freq: Two times a day (BID) | ORAL | Status: DC

## 2013-12-13 MED ORDER — LORAZEPAM 2 MG/ML IJ SOLN
1.0000 mg | Freq: Four times a day (QID) | INTRAMUSCULAR | Status: DC | PRN
  Administered 2013-12-13: 1 mg via INTRAVENOUS
  Filled 2013-12-13: qty 1

## 2013-12-13 MED ORDER — SODIUM CHLORIDE 0.9 % IV SOLN
1000.0000 mg | Freq: Once | INTRAVENOUS | Status: AC
  Administered 2013-12-13: 1000 mg via INTRAVENOUS
  Filled 2013-12-13: qty 10

## 2013-12-13 MED ORDER — ZOLPIDEM TARTRATE 5 MG PO TABS: 5.0000 mg | ORAL_TABLET | Freq: Every evening | ORAL | Status: DC | PRN

## 2013-12-13 MED ORDER — SODIUM CHLORIDE 0.9 % IV SOLN
1000.0000 mg | Freq: Two times a day (BID) | INTRAVENOUS | Status: DC
  Administered 2013-12-13: 1000 mg via INTRAVENOUS
  Filled 2013-12-13 (×2): qty 10

## 2013-12-13 MED ORDER — HYDROMORPHONE HCL PF 1 MG/ML IJ SOLN: 0.5000 mg | Freq: Once | INTRAMUSCULAR | Status: DC

## 2013-12-13 MED ORDER — LACOSAMIDE 50 MG PO TABS: 100.0000 mg | ORAL_TABLET | Freq: Two times a day (BID) | ORAL | Status: DC

## 2013-12-13 MED ORDER — HYDROMORPHONE HCL PF 1 MG/ML IJ SOLN
0.5000 mg | INTRAMUSCULAR | Status: DC | PRN
  Administered 2013-12-13: 1 mg via INTRAVENOUS
  Filled 2013-12-13: qty 1

## 2013-12-13 MED ORDER — SODIUM CHLORIDE 0.9 % IV SOLN
INTRAVENOUS | Status: DC
  Administered 2013-12-13: 05:00:00 via INTRAVENOUS

## 2013-12-13 MED ORDER — HYDROCODONE-ACETAMINOPHEN 5-325 MG PO TABS
1.0000 | ORAL_TABLET | Freq: Four times a day (QID) | ORAL | Status: DC | PRN
  Administered 2013-12-13: 2 via ORAL
  Filled 2013-12-13: qty 2

## 2013-12-13 MED ORDER — CARISOPRODOL 350 MG PO TABS
350.0000 mg | ORAL_TABLET | Freq: Every day | ORAL | Status: DC
  Administered 2013-12-13: 350 mg via ORAL
  Filled 2013-12-13: qty 1

## 2013-12-13 MED ORDER — HYDROMORPHONE HCL PF 1 MG/ML IJ SOLN
1.0000 mg | Freq: Once | INTRAMUSCULAR | Status: AC
  Administered 2013-12-13: 1 mg via INTRAVENOUS
  Filled 2013-12-13: qty 1

## 2013-12-13 MED ORDER — ONDANSETRON HCL 4 MG PO TABS: 4.0000 mg | ORAL_TABLET | Freq: Four times a day (QID) | ORAL | Status: DC | PRN

## 2013-12-13 MED ORDER — LACOSAMIDE 50 MG PO TABS
50.0000 mg | ORAL_TABLET | Freq: Two times a day (BID) | ORAL | Status: DC
  Administered 2013-12-13: 50 mg via ORAL
  Filled 2013-12-13: qty 1

## 2013-12-13 MED ORDER — DIPHENHYDRAMINE HCL 25 MG PO CAPS
50.0000 mg | ORAL_CAPSULE | Freq: Every day | ORAL | Status: DC | PRN
  Filled 2013-12-13: qty 2

## 2013-12-13 MED ORDER — PANTOPRAZOLE SODIUM 40 MG PO TBEC
40.0000 mg | DELAYED_RELEASE_TABLET | Freq: Every day | ORAL | Status: DC
  Administered 2013-12-13: 40 mg via ORAL
  Filled 2013-12-13: qty 1

## 2013-12-13 NOTE — ED Notes (Signed)
Pt husband reports pt has had 20 seizures over past day.  [place]ilyn [place] who changed her medication to Vimpat 2 days ago.  Pt into room via WC, head back, eyes fluttering, pt grabbed EDT by sleeve.  Pt has been dx with "partial seizures"

## 2013-12-13 NOTE — Procedures (Signed)
History: 33 yo F with "seizures"  Sedation: None  Background: The background consists of intermixed alpha and beta actvity. With hyperventilation, there is diffuse slowing, but no abnormal discharges. There is also delta associated with drowsiness. There is a well defined posterior dominant rhythm during wakefulness of 9 Hz that attenuates with eye opening.   There was a clinical event of bilateral arm flexion and generalized writhing. There are quasi-purposeful movements(adjusting pillow) during the episode. There is hip thrusting as well as back arching. Other than muscle artifact, no abnormal discharges or change in the underlying background rhythm were seen.   Photic stimulation: Physiologic driving is not performed.   EEG Abnormalities: None  Clinical Interpretation: This normal EEG is recorded in the waking and sleep state. There was no seizure or seizure predisposition recorded on this study.   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  Ritta SlotMcNeill Xareni Kelch, MD [facility] [phone]  If 7pm- 7am, please page neurology on call at [phone].

## 2013-12-13 NOTE — Progress Notes (Signed)
Removed IV by herself after this nurse went to retrieve tape and gauze  In order to remove it properly. Is very upset and pacing the room stating, "It is all in my head and when I drop dead it will be all in my head as well."  This nurse will attempt to go over her discharge information with her.

## 2013-12-13 NOTE — Discharge Summary (Signed)
Isabella Buchanan, is a 33 y.o. female  DOB December 19, 1980  MRN [id number].  Admission date:  12/13/2013  Admitting Physician  Ron ParkerHarvette C Jenkins, MD  Discharge Date:  12/13/2013   Primary MD  Lolita PatellaEADE,ROBERT ALEXANDER, MD  Recommendations for primary care physician for things to follow:       Admission Diagnosis  Polycystic ovarian syndrome [256.4] Seizures [780.39] Interstitial cystitis [595.1] Fibromyalgia [729.1] Chronic back pain [724.5, 338.29]  Discharge Diagnosis  Psychogenic Seizure  Principal Problem:   Seizures Active Problems:   Fibromyalgia   Chronic back pain   History of IBS      Past Medical History  Diagnosis Date  . Anxiety   . Fibromyalgia   . Restless leg   . Chronic back pain   . GERD (gastroesophageal reflux disease)   . Headache(784.0)     mygrains  . PONV (postoperative nausea and vomiting)   . Interstitial cystitis   . Polycystic ovarian syndrome   . Kidney stones     interstitual cystitis  . Nephrolithiasis   . Hiatal hernia   . Seizures   . IBS (irritable bowel syndrome)     Past Surgical History  Procedure Laterality Date  . Cholecystectomy    . Tonsillectomy    . Tubal ligation    . Tympanoplasty    . Esophagogastroduodenoscopy endoscopy       Discharge Condition: Stable    Follow UP  Follow-up Information   Follow up with Lolita PatellaEADE,ROBERT ALEXANDER, MD. Schedule an appointment as soon as possible for a visit in 1 week.   Specialty:  Family Medicine   Contact information:   [phone] [place] [address] [phone]       [facility]. Schedule an appointment as soon as possible for a visit in 1 week.   Specialty:  Neurology   Contact information:   [address] [place] [address] [phone]          Consults obtained - Neuro   Discharge Medications      Medication List         carisoprodol 350 MG tablet  Commonly known as:  SOMA  Take 350 mg by mouth daily.     clonazePAM 1 MG tablet  Commonly known as:  KLONOPIN  Take 1 mg by mouth daily as needed for anxiety. For anxiety     dexlansoprazole 60 MG capsule  Commonly known as:  DEXILANT  Take 60 mg by mouth daily.     diphenhydrAMINE 25 MG tablet  Commonly known as:  BENADRYL  Take 50 mg by mouth daily as needed for allergies.     escitalopram 10 MG tablet  Commonly known as:  LEXAPRO  Take 10 mg by mouth daily.     HYDROcodone-acetaminophen 10-325 MG per tablet  Commonly known as:  NORCO  Take 1 tablet by mouth every 6 (six) hours as needed for moderate pain or severe pain.     ibuprofen 200 MG  tablet  Commonly known as:  ADVIL,MOTRIN  Take 800 mg by mouth every 6 (six) hours as needed for pain.     lacosamide 50 MG Tabs tablet  Commonly known as:  VIMPAT  - Take 50-100 mg by mouth 2 (two) times daily. Take 1 tablet (50mg ) twice daily for 1 week, then increase to 100mg  twice daily  - Patient has started pack with both strengths of medication     ondansetron 8 MG tablet  Commonly known as:  ZOFRAN  Take 8 mg by mouth daily as needed for nausea or vomiting.     pramipexole 1 MG tablet  Commonly known as:  MIRAPEX  Take 1 mg by mouth at bedtime.     pregabalin 75 MG capsule  Commonly known as:  LYRICA  Take 75 mg by mouth 2 (two) times daily as needed (for excessive pain).     SALONPAS 1.2-5.7-6.3 % Ptch  Generic drug:  Camphor-Menthol-Methyl Sal  Apply 1 patch topically daily as needed (for pain).         Diet and Activity recommendation: See Discharge Instructions below   Discharge Instructions     Do not drive and provide baby sitting services until you have seen by Primary MD or a Neurologist and advised to do so again  Follow with Primary MD [name], MD in 7 days    Get CBC, CMP, checked 7 days by Primary MD and again as instructed by your Primary MD.    Activity: As tolerated with Full fall precautions use walker/cane & assistance as needed   Disposition Home     Diet: Heart Healthy    For Heart failure patients - Check your Weight same time everyday, if you gain over 2 pounds, or you develop in leg swelling, experience more shortness of breath or chest pain, call your Primary MD immediately. Follow Cardiac Low Salt Diet and 1.8 lit/day fluid restriction.   On your next visit with her primary care physician please Get Medicines reviewed and adjusted.  Please request your Prim.MD to go over all Hospital Tests and Procedure/Radiological results at the follow up, please get all Hospital records sent to your Prim MD by signing hospital release before you go home.   If you experience worsening of your admission symptoms, develop shortness of breath, life threatening emergency, suicidal or homicidal thoughts you must seek medical attention immediately by calling 911 or calling your MD immediately  if symptoms less severe.  You Must read complete instructions/literature along with all the possible adverse reactions/side effects for all the Medicines you take and that have been prescribed to you. Take any new Medicines after you have completely understood and accpet all the possible adverse reactions/side effects.    Do not drive when taking Pain medications.    Do not take more than prescribed Pain, Sleep and Anxiety Medications  Special Instructions: If you have smoked or chewed Tobacco  in the last 2 yrs please stop smoking, stop any regular Alcohol  and or any Recreational drug use.  Wear Seat belts while driving.   Please note  You were cared for by a hospitalist during your hospital stay. If you have any questions about your discharge medications or the care you received while you were in the hospital after you are discharged, you can call  the unit and asked to speak with the hospitalist on call if the hospitalist that took care of you is not available. Once you are discharged, your primary care  physician will handle any further medical issues. Please note that NO REFILLS for any discharge medications will be authorized once you are discharged, as it is imperative that you return to your primary care physician (or establish a relationship with a primary care physician if you do not have one) for your aftercare needs so that they can reassess your need for medications and monitor your lab values.    Major procedures and Radiology Reports - PLEASE review detailed and final reports for all details, in brief -    EEG  - EEG reviewed, spell captured with no EEG cahnge    Dg Chest 2 View  11/18/2013   CLINICAL DATA:  Cough for 3 weeks duration.  EXAM: CHEST  2 VIEW  COMPARISON:  11/26/2012  FINDINGS: Heart size is normal. Mediastinal shadows are normal. There is central bronchial thickening but no infiltrate, collapse or effusion. No bony abnormality.  IMPRESSION: Bronchitis.  No consolidation or collapse.   Electronically Signed   By: Paulina Fusi M.D.   On: 11/18/2013 10:41   Ct Head Wo Contrast  12/13/2013   CLINICAL DATA:  Seizures.  EXAM: CT HEAD WITHOUT CONTRAST  TECHNIQUE: Contiguous axial images were obtained from the base of the skull through the vertex without intravenous contrast.  COMPARISON:  MR HEAD WO/W CM dated 09/04/2013; CT HEAD W/O CM dated 08/09/2013  FINDINGS: There is no evidence of acute infarction, mass lesion, or intra- or extra-axial hemorrhage on CT.  The posterior fossa, including the cerebellum, brainstem and fourth ventricle, is within normal limits. The third and lateral ventricles, and basal ganglia are unremarkable in appearance. The cerebral hemispheres are symmetric in appearance, with normal gray-white differentiation. No mass effect or midline shift is seen.  There is no evidence of fracture; visualized  osseous structures are unremarkable in appearance. The orbits are within normal limits. The paranasal sinuses and mastoid air cells are well-aerated. No significant soft tissue abnormalities are seen.  IMPRESSION: Unremarkable noncontrast CT of the head.   Electronically Signed   By: Roanna Raider M.D.   On: 12/13/2013 02:50    Micro Results      No results found for this or any previous visit (from the past 240 hour(s)).   History of present illness and  Hospital Course:     Kindly see H&P for history of present illness and admission details, please review complete Labs, Consult reports and Test reports for all details in brief Isabella Buchanan, is a 33 y.o. female, patient with history of  Seizures who has had worsening of her seizures for over 1 week, she had been seen by her Neurologist Dr. Janann Colonel in [place] 3 days ago and was taken off of her Topamax therapy and started on Vimpat.     She continued to have worsening of her Seizures that Were witnessed and described by her Fiance, who observed tonic clonic activity which he states occurred 20 times over the past 3 days. He also reports that he and her 81 year old daughter were insisted that she go to the hospital for evaluation but she kept refusing to go. She finally went to the ED at [facility] after her 33 year old daughter demanded that she go tonight. She had 3 more witnesses Seizures in the ED by staff and was loaded with IV Keppra, and a CT Scan of the Head was performed which was negative for acute findings. Neurology was called and consulted and recommended admission.  Patient was seen here by Neuro, history and EEG consistent with Psudo Seizures, Neuro recommends Home DC with outpt Neuro and Psych follow up.   She will commence her Home meds completely unchanged.    Neuro Note  Impression: 33 yo F with no EEG cahnge during event which both fiance and patient state are typical of the spells promtping admission. I  am not certain of what her neurologist saw on the EEG he performed and therefore will not stop her AED at this time, but do not feel that advancing therapy is really needed either. This is consistent with psychogenic non-epileptic spells.   Recommendations:   1) Vimpat 50mg  BID as previously prescribed.  2) I advised patient to seek psychological counseling for her PNES.  3) Pt will call to schedule appointment with primary neurologist.  4) No further recommendations at this time, please call with any further questions          Today   Subjective:   Isabella Buchanan today has no headache,no chest abdominal pain,no new weakness tingling or numbness, feels much better wants to go home today. Chronic body aches and pains  Objective:    Blood pressure 116/66, pulse 74, temperature 98.5 F (36.9 C), temperature source Oral, resp. rate 20, height 5\' 6"  (1.676 m), weight 90.357 kg (199 lb 3.2 oz), last menstrual period 12/06/2013, SpO2 93.00%.  No intake or output data in the 24 hours ending 12/13/13 1138  Exam Awake Alert, Oriented *3, No new F.N deficits, Normal affect, in no distress [place].AT,PERRAL Supple Neck,No JVD, No cervical lymphadenopathy appriciated.  Symmetrical Chest wall movement, Good air movement bilaterally, CTAB RRR,No Gallops,Rubs or new Murmurs, No Parasternal Heave +ve B.Sounds, Abd Soft, Non tender, No organomegaly appriciated, No rebound -guarding or rigidity. No Cyanosis, Clubbing or edema, No new Rash or bruise  Data Review   CBC w Diff:  Lab Results  Component Value Date   WBC 10.1 12/13/2013   HGB 13.9 12/13/2013   HCT 41.0 12/13/2013   PLT 312 12/13/2013   LYMPHOPCT 17 08/09/2013   MONOPCT 5 08/09/2013   EOSPCT 2 08/09/2013   BASOPCT 1 08/09/2013    CMP:  Lab Results  Component Value Date   NA 142 12/13/2013   K 4.1 12/13/2013   CL 107 12/13/2013   CO2 23 12/13/2013   BUN 13 12/13/2013   CREATININE 0.72 12/13/2013   PROT 7.1 08/09/2013   ALBUMIN 3.6  08/09/2013   BILITOT <0.1* 08/09/2013   ALKPHOS 88 08/09/2013   AST 13 08/09/2013   ALT 11 08/09/2013  .   Total Time in preparing paper work, data evaluation and todays exam - 35 minutes  Leroy Sea M.D on 12/13/2013 at 11:38 AM  [facility] Office  [phone]

## 2013-12-13 NOTE — Progress Notes (Signed)
EEG at bedside completed.  Notified Dr. Amada JupiterKirkpatrick of completion.

## 2013-12-13 NOTE — Progress Notes (Signed)
Witnessed seizure post CT exam.  Seizure lasted for about 5 minutes. Rapid response called and patient placed on oxygen, patient then transported to ED with Rapid Response nurse.  Kim Stanislaw Acton RT-R-CT

## 2013-12-13 NOTE — ED Notes (Signed)
Pt.'s fiancee reported grand mal seizures at home this evening with generalized body aches at arrival , respirations unlabored / alert and oriented.

## 2013-12-13 NOTE — Progress Notes (Signed)
Patient received from emergency dept to 3w05 via stretcher at 0515. Patient awake, oriented, verbal, and able to follow all commands with no difficulty. Patient denies pain or discomfort. Vital signs stable. IV site saline locked. Patient assisted to bed. EKG monitor placed on patient. Patient oriented to unit and room. Will continue to monitor.

## 2013-12-13 NOTE — Progress Notes (Signed)
She admitted few hours ago for recurrent seizures, history of fibromyalgia and chronic pain. Currently appears stable with stable vital signs. Neurology to see. Continue supportive care. Appears to be seizure free and symptom free at this time.

## 2013-12-13 NOTE — Progress Notes (Signed)
Received call from room 3w05 in R/T pain meds when this nurse informed patient that when pain med was due had to be verified she became verbally aggressive, "this shit is ridiculous, I am being told different things by different people" and hung up. This nurse verified that Dilaudid 0.5mg  to 1mg  was PRN every 3 hours, a dose was pulled from pyxis and taken to the room.  This nurse explained to patient that staff is here to take care of her and not to be yelled at and spoken to in an ugly manner, she informed this nurse "you ain't seen ugly yet".  This nurse informed her I would not tolerate being verbally abused and that our day would go better if she were civil and polite instead of illmannered with staff.  Finally received an understanding from her and I believe that the day will proceed without further incidence of verbal abuse.

## 2013-12-13 NOTE — Consult Note (Signed)
Reason for Consult: Recurrent "seizures"  HPI:                                                                                                                                          Isabella Buchanan is an 33 y.o. female with a history of fibromyalgia, restless leg syndrome, chronic anxiety, irritable bowel syndrome and recent onset seizure activity, presenting to the emergency room following multiple apparent seizures. Patient reported that about 20 seizures described as generalized tonic-clonic prior to coming to the hospital. She had multiple spells after arrival. Onset of seizures is somewhat vague. There was no associated acute illness or injury. Patient was taking Topamax at the time of onset. Topamax was changed to Vimpat 2 days prior to admission. She was taking Vimpat 50 mg twice a day. Patient was noted to have a spell in the emergency room consisting of shaking of her lower extremities. At one point she reportedly stopped taking, repositioned himself, then resumed shaking her lower extremities. She reportedly was oriented x4 immediately afterwards. She also reportedly was somewhat confused following a different spell the emergency room. She had an MRI study of the brain at [facility] on 09/02/2013 which was normal. She also reportedly had an outpatient EEG which showed focal seizure discharges.  Past Medical History  Diagnosis Date  . Anxiety   . Fibromyalgia   . Restless leg   . Chronic back pain   . GERD (gastroesophageal reflux disease)   . Headache(784.0)     mygrains  . PONV (postoperative nausea and vomiting)   . Interstitial cystitis   . Polycystic ovarian syndrome   . Kidney stones     interstitual cystitis  . Nephrolithiasis   . Hiatal hernia   . Seizures   . IBS (irritable bowel syndrome)     Past Surgical History  Procedure Laterality Date  . Cholecystectomy    . Tonsillectomy    . Tubal ligation    . Tympanoplasty    . Esophagogastroduodenoscopy endoscopy       Family History  Problem Relation Age of Onset  . Hypertension Father   . Diabetes Father   . Cancer Other   . Diabetes Other   . Seizures Father     Social History:  reports that she has been smoking Cigarettes.  She has a 15 pack-year smoking history. She has never used smokeless tobacco. She reports that she does not drink alcohol or use illicit drugs.  Allergies  Allergen Reactions  . Omnicef [Cefdinir] Swelling    Swelling of tongue  . Codeine Other (See Comments)    Stomach aches  . Sulfa Antibiotics Nausea And Vomiting  . Amoxicillin Rash and Other (See Comments)    vomitting  . Penicillins Rash and Other (See Comments)    vomitting    MEDICATIONS:  I have reviewed the patient's current medications.   ROS:                                                                                                                                       History obtained from the patient  General ROS: negative for - chills, fatigue, fever, night sweats, weight gain or weight loss Psychological ROS: negative for - behavioral disorder, hallucinations, memory difficulties, mood swings or suicidal ideation Ophthalmic ROS: negative for - blurry vision, double vision, eye pain or loss of vision ENT ROS: negative for - epistaxis, nasal discharge, oral lesions, sore throat, tinnitus or vertigo Allergy and Immunology ROS: negative for - hives or itchy/watery eyes Hematological and Lymphatic ROS: negative for - bleeding problems, bruising or swollen lymph nodes Endocrine ROS: negative for - galactorrhea, hair pattern changes, polydipsia/polyuria or temperature intolerance Respiratory ROS: negative for - cough, hemoptysis, shortness of breath or wheezing Cardiovascular ROS: negative for - chest pain, dyspnea on exertion, edema or irregular heartbeat Gastrointestinal ROS: negative  for - abdominal pain, diarrhea, hematemesis, nausea/vomiting or stool incontinence Genito-Urinary ROS: negative for - dysuria, hematuria, incontinence or urinary frequency/urgency Musculoskeletal ROS: Chronic pain associated with fibromyalgia Neurological ROS: as noted in HPI Dermatological ROS: negative for rash and skin lesion changes   Blood pressure 123/77, pulse 76, temperature 98.2 F (36.8 C), temperature source Oral, resp. rate 24, height 5\' 6"  (1.676 m), last menstrual period 12/06/2013, SpO2 95.00%.   Neurologic Examination:                                                                                                      Mental Status: Alert, oriented, depressed affect and tearful.  Speech fluent without evidence of aphasia. Able to follow commands without difficulty. Cranial Nerves: II-Visual fields were normal. III/IV/VI-Pupils were equal and reacted. Extraocular movements were full and conjugate.    V/VII-no facial numbness and no facial weakness. VIII-normal. X-normal speech and symmetrical palatal movement Motor: 5/5 bilaterally with normal tone and bulk Sensory: Normal throughout. Deep Tendon Reflexes: 2+ and symmetric. Plantars: Flexor bilaterally Cerebellar: Normal finger-to-nose testing.  No results found for this basename: cbc, bmp, coags, chol, tri, ldl, hga1c    Results for orders placed during the hospital encounter of 12/13/13 (from the past 48 hour(s))  CBC     Status: Abnormal   Collection Time    12/13/13  1:24 AM      Result Value Range   WBC 10.1  4.0 -  10.5 K/uL   RBC 5.14 (*) 3.87 - 5.11 MIL/uL   Hemoglobin 14.8  12.0 - 15.0 g/dL   HCT 16.1  09.6 - 04.5 %   MCV 86.4  78.0 - 100.0 fL   MCH 28.8  26.0 - 34.0 pg   MCHC 33.3  30.0 - 36.0 g/dL   RDW 40.9  81.1 - 91.4 %   Platelets 312  150 - 400 K/uL  URINE RAPID DRUG SCREEN (HOSP PERFORMED)     Status: Abnormal   Collection Time    12/13/13  2:05 AM      Result Value Range   Opiates  POSITIVE (*) NONE DETECTED   Cocaine NONE DETECTED  NONE DETECTED   Benzodiazepines POSITIVE (*) NONE DETECTED   Amphetamines NONE DETECTED  NONE DETECTED   Tetrahydrocannabinol NONE DETECTED  NONE DETECTED   Barbiturates NONE DETECTED  NONE DETECTED   Comment:            DRUG SCREEN FOR MEDICAL PURPOSES     ONLY.  IF CONFIRMATION IS NEEDED     FOR ANY PURPOSE, NOTIFY LAB     WITHIN 5 DAYS.                LOWEST DETECTABLE LIMITS     FOR URINE DRUG SCREEN     Drug Class       Cutoff (ng/mL)     Amphetamine      1000     Barbiturate      200     Benzodiazepine   200     Tricyclics       300     Opiates          300     Cocaine          300     THC              50  URINALYSIS, ROUTINE W REFLEX MICROSCOPIC     Status: Abnormal   Collection Time    12/13/13  2:05 AM      Result Value Range   Color, Urine AMBER (*) YELLOW   Comment: BIOCHEMICALS MAY BE AFFECTED BY COLOR   APPearance TURBID (*) CLEAR   Specific Gravity, Urine 1.023  1.005 - 1.030   pH 7.5  5.0 - 8.0   Glucose, UA NEGATIVE  NEGATIVE mg/dL   Hgb urine dipstick LARGE (*) NEGATIVE   Bilirubin Urine NEGATIVE  NEGATIVE   Ketones, ur NEGATIVE  NEGATIVE mg/dL   Protein, ur 30 (*) NEGATIVE mg/dL   Urobilinogen, UA 0.2  0.0 - 1.0 mg/dL   Nitrite NEGATIVE  NEGATIVE   Leukocytes, UA SMALL (*) NEGATIVE  URINE MICROSCOPIC-ADD ON     Status: Abnormal   Collection Time    12/13/13  2:05 AM      Result Value Range   Squamous Epithelial / LPF FEW (*) RARE   WBC, UA 3-6  <3 WBC/hpf   RBC / HPF TOO NUMEROUS TO COUNT  <3 RBC/hpf   Bacteria, UA FEW (*) RARE  POCT I-STAT, CHEM 8     Status: Abnormal   Collection Time    12/13/13  4:10 AM      Result Value Range   Sodium 141  137 - 147 mEq/L   Potassium 3.5 (*) 3.7 - 5.3 mEq/L   Chloride 106  96 - 112 mEq/L   BUN 12  6 - 23 mg/dL   Creatinine, Ser 7.82  0.50 -  1.10 mg/dL   Glucose, Bld 161 (*) 70 - 99 mg/dL   Calcium, Ion 0.96  0.45 - 1.23 mmol/L   TCO2 23  0 - 100 mmol/L    Hemoglobin 13.9  12.0 - 15.0 g/dL   HCT 40.9  81.1 - 91.4 %    Ct Head Wo Contrast  12/13/2013   CLINICAL DATA:  Seizures.  EXAM: CT HEAD WITHOUT CONTRAST  TECHNIQUE: Contiguous axial images were obtained from the base of the skull through the vertex without intravenous contrast.  COMPARISON:  MR HEAD WO/W CM dated 09/04/2013; CT HEAD W/O CM dated 08/09/2013  FINDINGS: There is no evidence of acute infarction, mass lesion, or intra- or extra-axial hemorrhage on CT.  The posterior fossa, including the cerebellum, brainstem and fourth ventricle, is within normal limits. The third and lateral ventricles, and basal ganglia are unremarkable in appearance. The cerebral hemispheres are symmetric in appearance, with normal gray-white differentiation. No mass effect or midline shift is seen.  There is no evidence of fracture; visualized osseous structures are unremarkable in appearance. The orbits are within normal limits. The paranasal sinuses and mastoid air cells are well-aerated. No significant soft tissue abnormalities are seen.  IMPRESSION: Unremarkable noncontrast CT of the head.   Electronically Signed   By: Roanna Raider M.D.   On: 12/13/2013 02:50   Assessment/Plan: 33 year old lady with presenting with recurrent spells with seizure-like activity. She reportedly has had an abnormal EEG. It appears to be significant psychophysiologic factors contributing to patient's recurrent spells, although she may have epilepsy as well is possibly pseudoseizures.  Recommendations: 1. Keppra 500 mg twice a day, for now. 2. Hold Vimpat 3. EEG with consideration for long-term video EEG monitoring.  We will continue to follow this patient with you.  C.R. Roseanne Reno, MD [facility] [phone]  12/13/2013, 6:06 AM

## 2013-12-13 NOTE — Significant Event (Addendum)
Rapid Response Event Note  Overview: Time Called: 0230 Arrival Time: 0230 Event Type: Neurologic  Initial Focused Assessment:  Called by CT technician for assistance with pt  who apparently had a witnessed  seizure  After completion of CT scan.  According to staff pt arched her back and drew up her right arm with a "shaking motion" lasting 5 minutes.  Upon my arrival pt is alert, oriented times 4 , MAE, had no incontinence of bladder or bowel, no seizure activity noted  receiving O2 via simple face mask at 15 liters.     Pt was moved from CT table to stretcher and returned to ED A6.  Dr Dierdre Highmanpitz and pt's nurse Lesle ReekBarb notified of event.  VS on return to ED:  129/78  76 SR 16 99% RAl    Interventions:    Event Summary: Name of Physician Notified: Dr Dierdre Highmanopitz at 0240    at    Outcome: Other (Comment) (Returned to ED A6 from CT #2)  Event End Time: 0245  Jules Schickrivette, Eiliana Drone K

## 2013-12-13 NOTE — ED Provider Notes (Signed)
CSN: [id number]     Arrival date & time 12/13/13  0050 History   First MD Initiated Contact with Patient 12/13/13 0120     Chief Complaint  Patient presents with  . Seizures   (Consider location/radiation/quality/duration/timing/severity/associated sxs/prior Treatment) HPI History provided by patient. Followed by neurology Dr. Gerilyn Pilgrimoonquah for history of seizures. For increasing seizure activity he was switched from her Topamax to Vimpat 2 days ago. She reports persistent and increasing frequency of seizures over the last 2 days. She denies any fall or trauma. No tongue laceration. No incontinence. Significant other bedside reports last seizure tonight prior to arrival was generalized tonic-clonic activity lasting over 3 minutes. Patient amnestic to seizure events. She also has history of kidney stones, fibromyalgia chronic pain. She states her pain medications at home are helping and is requesting narcotics. She denies any sensation of her klonopin, recent fevers chills, recent illness. She reports some sleep deprivation related to her chronic pain.   Past Medical History  Diagnosis Date  . Anxiety   . Fibromyalgia   . Restless leg   . Chronic back pain   . GERD (gastroesophageal reflux disease)   . Headache(784.0)     mygrains  . PONV (postoperative nausea and vomiting)   . Interstitial cystitis   . Polycystic ovarian syndrome   . Kidney stones     interstitual cystitis  . Nephrolithiasis   . Hiatal hernia   . Seizures    Past Surgical History  Procedure Laterality Date  . Cholecystectomy    . Tonsillectomy    . Tubal ligation    . Tympanoplasty    . Esophagogastroduodenoscopy endoscopy     Family History  Problem Relation Age of Onset  . Hypertension Father   . Diabetes Father   . Cancer Other   . Diabetes Other    History  Substance Use Topics  . Smoking status: Current Every Day Smoker -- 1.00 packs/day for 15 years    Types: Cigarettes  . Smokeless tobacco: Never  Used  . Alcohol Use: No   OB History   Grav Para Term Preterm Abortions TAB SAB Ect Mult Living   2 2  2      2      Review of Systems  Constitutional: Negative for fever and chills.  Respiratory: Negative for shortness of breath.   Cardiovascular: Negative for chest pain.  Gastrointestinal: Negative for vomiting and abdominal pain.  Genitourinary: Negative for dysuria.  Musculoskeletal: Negative for back pain, neck pain and neck stiffness.  Skin: Negative for rash.  Neurological: Positive for seizures. Negative for headaches.  All other systems reviewed and are negative.    Allergies  Omnicef; Codeine; Sulfa antibiotics; Amoxicillin; and Penicillins  Home Medications   Current Outpatient Rx  Name  Route  Sig  Dispense  Refill  . benzonatate (TESSALON) 200 MG capsule   Oral   Take 1 capsule (200 mg total) by mouth 3 (three) times daily as needed for cough.   20 capsule   0   . Camphor-Menthol-Methyl Sal (SALONPAS) 1.2-5.7-6.3 % PTCH   Apply externally   Apply 1 patch topically daily as needed (for pain).         . carisoprodol (SOMA) 350 MG tablet   Oral   Take 350 mg by mouth daily.          . clonazePAM (KLONOPIN) 1 MG tablet   Oral   Take 1 mg by mouth daily as needed for anxiety. For anxiety         .  dexlansoprazole (DEXILANT) 60 MG capsule   Oral   Take 60 mg by mouth daily.         . diphenhydrAMINE (BENADRYL) 25 MG tablet   Oral   Take 50 mg by mouth daily as needed for allergies.         Marland Kitchen escitalopram (LEXAPRO) 10 MG tablet   Oral   Take 10 mg by mouth daily.         Marland Kitchen HYDROcodone-acetaminophen (NORCO) 10-325 MG per tablet   Oral   Take 1 tablet by mouth 3 (three) times daily.         Marland Kitchen ibuprofen (ADVIL,MOTRIN) 200 MG tablet   Oral   Take 800 mg by mouth every 6 (six) hours as needed for pain.          Marland Kitchen ondansetron (ZOFRAN) 8 MG tablet   Oral   Take 8 mg by mouth daily as needed for nausea or vomiting.         .  pramipexole (MIRAPEX) 1 MG tablet   Oral   Take 1 mg by mouth at bedtime.         . pregabalin (LYRICA) 75 MG capsule   Oral   Take 75 mg by mouth 2 (two) times daily.         Marland Kitchen topiramate (TOPAMAX) 50 MG tablet   Oral   Take 50 mg by mouth daily.          BP 146/103  Pulse 80  Temp(Src) 98.2 F (36.8 C) (Oral)  Resp 13  SpO2 98%  LMP 12/06/2013 Physical Exam  Constitutional: She is oriented to person, place, and time. She appears well-developed and well-nourished.  HENT:  Head: Normocephalic and atraumatic.  Mouth/Throat: Oropharynx is clear and moist.  Edentulous, no tongue laceration  Eyes: EOM are normal. Pupils are equal, round, and reactive to light.  Neck: Neck supple.  Cardiovascular: Normal rate, regular rhythm and intact distal pulses.   Pulmonary/Chest: Effort normal and breath sounds normal. No respiratory distress.  Abdominal: Soft. She exhibits no distension. There is no tenderness.  Musculoskeletal: Normal range of motion. She exhibits no edema and no tenderness.  Neurological: She is alert and oriented to person, place, and time. No cranial nerve deficit. Coordination normal.  Skin: Skin is warm and dry.    ED Course  Procedures (including critical care time) Labs Review Labs Reviewed  CBC - Abnormal; Notable for the following:    RBC 5.14 (*)    All other components within normal limits  URINE RAPID DRUG SCREEN (HOSP PERFORMED) - Abnormal; Notable for the following:    Opiates POSITIVE (*)    Benzodiazepines POSITIVE (*)    All other components within normal limits  URINALYSIS, ROUTINE W REFLEX MICROSCOPIC - Abnormal; Notable for the following:    Color, Urine AMBER (*)    APPearance TURBID (*)    Hgb urine dipstick LARGE (*)    Protein, ur 30 (*)    Leukocytes, UA SMALL (*)    All other components within normal limits  URINE MICROSCOPIC-ADD ON - Abnormal; Notable for the following:    Squamous Epithelial / LPF FEW (*)    Bacteria, UA FEW  (*)    All other components within normal limits  URINE CULTURE   Imaging Review Ct Head Wo Contrast  12/13/2013   CLINICAL DATA:  Seizures.  EXAM: CT HEAD WITHOUT CONTRAST  TECHNIQUE: Contiguous axial images were obtained from the base of the skull through  the vertex without intravenous contrast.  COMPARISON:  MR HEAD WO/W CM dated 09/04/2013; CT HEAD W/O CM dated 08/09/2013  FINDINGS: There is no evidence of acute infarction, mass lesion, or intra- or extra-axial hemorrhage on CT.  The posterior fossa, including the cerebellum, brainstem and fourth ventricle, is within normal limits. The third and lateral ventricles, and basal ganglia are unremarkable in appearance. The cerebral hemispheres are symmetric in appearance, with normal gray-white differentiation. No mass effect or midline shift is seen.  There is no evidence of fracture; visualized osseous structures are unremarkable in appearance. The orbits are within normal limits. The paranasal sinuses and mastoid air cells are well-aerated. No significant soft tissue abnormalities are seen.  IMPRESSION: Unremarkable noncontrast CT of the head.   Electronically Signed   By: Roanna Raider M.D.   On: 12/13/2013 02:50    EKG Interpretation   None      RN reports witnessed seizure activity while in CT scan followed by postictal state  3:18 AM discussed with neurology, Dr. Roseanne Reno recommends load with Keppra 1 g now and admit to hospitalist service - will follow.  MDM  Diagnosis: Recurrent seizure activity  CT brain, labs, UDS reviewed as above Neurology consult and Keppra load Medical admission    Sunnie Nielsen, MD 12/13/13 [phone]

## 2013-12-13 NOTE — Discharge Instructions (Signed)
Do not drive and provide baby sitting services until you have seen by Primary MD or a Neurologist and advised to do so again  Follow with Primary MD Lolita PatellaEADE,ROBERT ALEXANDER, MD in 7 days   Get CBC, CMP, checked 7 days by Primary MD and again as instructed by your Primary MD.    Activity: As tolerated with Full fall precautions use walker/cane & assistance as needed   Disposition Home     Diet: Heart Healthy    For Heart failure patients - Check your Weight same time everyday, if you gain over 2 pounds, or you develop in leg swelling, experience more shortness of breath or chest pain, call your Primary MD immediately. Follow Cardiac Low Salt Diet and 1.8 lit/day fluid restriction.   On your next visit with her primary care physician please Get Medicines reviewed and adjusted.  Please request your Prim.MD to go over all Hospital Tests and Procedure/Radiological results at the follow up, please get all Hospital records sent to your Prim MD by signing hospital release before you go home.   If you experience worsening of your admission symptoms, develop shortness of breath, life threatening emergency, suicidal or homicidal thoughts you must seek medical attention immediately by calling 911 or calling your MD immediately  if symptoms less severe.  You Must read complete instructions/literature along with all the possible adverse reactions/side effects for all the Medicines you take and that have been prescribed to you. Take any new Medicines after you have completely understood and accpet all the possible adverse reactions/side effects.    Do not drive when taking Pain medications.    Do not take more than prescribed Pain, Sleep and Anxiety Medications  Special Instructions: If you have smoked or chewed Tobacco  in the last 2 yrs please stop smoking, stop any regular Alcohol  and or any Recreational drug use.  Wear Seat belts while driving.   Please note  You were cared for by a  hospitalist during your hospital stay. If you have any questions about your discharge medications or the care you received while you were in the hospital after you are discharged, you can call the unit and asked to speak with the hospitalist on call if the hospitalist that took care of you is not available. Once you are discharged, your primary care physician will handle any further medical issues. Please note that NO REFILLS for any discharge medications will be authorized once you are discharged, as it is imperative that you return to your primary care physician (or establish a relationship with a primary care physician if you do not have one) for your aftercare needs so that they can reassess your need for medications and monitor your lab values.

## 2013-12-13 NOTE — Progress Notes (Signed)
Subjective: Had spell captured by EEG. Both patient and fiance state that this is typical of the spells that prompted admission today.   Further history finds that her father died 7 years ago on 1/31, and her daughter has begun cutting herself. She is not willing to pursue psychotherapy based on previous experiences with psychologists.   Exam: Filed Vitals:   12/13/13 0545  BP: 116/66  Pulse: 74  Temp: 98.5 F (36.9 C)  Resp: 20   Gen: In bed, NAD MS: Awake, Alert, intera ZO:XWRUECN:PERRL, EOMI Motor: MAEW Sensory:intact to LT  EEG reviewed, spell captured with no EEG cahnge   Impression: 33 yo F with no EEG cahnge during event which both fiance and patient state are typical of the spells promtping admission. I am not certain of what her neurologist saw on the EEG he performed and therefore will not stop her AED at this time, but do not feel that advancing therapy is really needed either. This is consistent with psychogenic non-epileptic spells.   Recommendations: 1) Vimpat 50mg  BID as previously prescribed.  2) I advised patient to seek psychological counseling for her PNES.  3) Pt will call to schedule appointment with primary neurologist.  4) No further recommendations at this time, please call with any further questions.   Ritta SlotMcNeill Orian Figueira, MD [facility] [phone]  If 7pm- 7am, please page neurology on call at [phone].

## 2013-12-13 NOTE — H&P (Signed)
[facility] History and Physical  Isabella Buchanan ZOX:[id number] DOB: 03-May-1981 DOA: 12/13/2013  Referring physician:  PCP: [name], MD  Specialists:   Chief Complaint:  Seizures  HPI: Isabella Buchanan is a 33 y.o. female with a history of Seizures who has had worsening of her seizures for over 1 week, she had been seen by her Neurologist Dr. Janann Colonel in [place] 3 days ago and was taken off of her Topamax therapy and started on Vimpat.   She continued to have worsening of her Seizures that  Were witnessed and described by her Fiance, who observed tonic clonic activity which he states occurred 20 times over the past 3 days.  He also reports that he and her 99 year old daughter were insisted that she go to the hospital for evaluation but she kept refusing to go.   She finally went to the ED at [facility] after her 34 year old daughter demanded that she go tonight.   She had 3 more witnesses Seizures in the ED by staff and was loaded with IV Keppra, and a CT Scan of the Head was performed which was negative for acute findings.  Neurology was called and consulted and recommended admission.      Review of Systems:  Constitutional: No weight loss, night sweats, Fevers, Chills, +Fatigue, +Generalized Weakness HEENT: +Migraine headaches, Difficulty swallowing,Tooth/dental problems,Sore throat,  No sneezing, itching, ear ache, nasal congestion, post nasal drip,  Cardio-vascular:  No chest pain, Orthopnea, PND, Edema in lower extremities, Anasarca, dizziness, palpitations  Resp: No shortness of breath, DOE. No excess mucus, no productive cough, No non-productive cough, No hemoptysis, No change in color of mucus.No wheezing.No chest wall deformity GI: No heartburn, indigestion, abdominal pain, +Nausea, vomiting, diarrhea, change in bowel habits, loss of appetite  GU: no dysuria, change in color of urine, no urgency or frequency. No flank pain.  Musculoskeletal: +Muscle and Joint  pain,  No swelling. No decreased range of motion. +Back pain.  Neurologic: No syncope, +Seizures, +Muscle Weakness, Paresthesia, Vision disturbance or Loss, No Diplopia, No Vertigo, No Difficulty Walking,  Skin: no rash or lesions. Psych: +Changes in mood or affect. +Depression, + Anxiety. No memory loss. No confusion or hallucinations   Past Medical History  Diagnosis Date  . Anxiety   . Fibromyalgia   . Restless leg   . Chronic back pain   . GERD (gastroesophageal reflux disease)   . Headache(784.0)     mygrains  . PONV (postoperative nausea and vomiting)   . Interstitial cystitis   . Polycystic ovarian syndrome   . Kidney stones     interstitual cystitis  . Nephrolithiasis   . Hiatal hernia   . Seizures   . IBS (irritable bowel syndrome)      Past Surgical History  Procedure Laterality Date  . Cholecystectomy    . Tonsillectomy    . Tubal ligation    . Tympanoplasty    . Esophagogastroduodenoscopy endoscopy      Prior to Admission medications   Medication Sig Start Date End Date Taking? Authorizing Provider  Camphor-Menthol-Methyl Sal (SALONPAS) 1.2-5.7-6.3 % PTCH Apply 1 patch topically daily as needed (for pain).   Yes Historical Provider, MD  carisoprodol (SOMA) 350 MG tablet Take 350 mg by mouth daily.    Yes Historical Provider, MD  clonazePAM (KLONOPIN) 1 MG tablet Take 1 mg by mouth daily as needed for anxiety. For anxiety   Yes Historical Provider, MD  dexlansoprazole (DEXILANT) 60 MG  capsule Take 60 mg by mouth daily.   Yes Historical Provider, MD  diphenhydrAMINE (BENADRYL) 25 MG tablet Take 50 mg by mouth daily as needed for allergies.   Yes Historical Provider, MD  escitalopram (LEXAPRO) 10 MG tablet Take 10 mg by mouth daily.   Yes Historical Provider, MD  HYDROcodone-acetaminophen (NORCO) 10-325 MG per tablet Take 1 tablet by mouth every 6 (six) hours as needed for moderate pain or severe pain.    Yes Historical Provider, MD  ibuprofen (ADVIL,MOTRIN) 200  MG tablet Take 800 mg by mouth every 6 (six) hours as needed for pain.    Yes Historical Provider, MD  lacosamide (VIMPAT) 50 MG TABS tablet Take 50-100 mg by mouth 2 (two) times daily. Take 1 tablet (50mg ) twice daily for 1 week, then increase to 100mg  twice daily Patient has started pack with both strengths of medication   Yes Historical Provider, MD  ondansetron (ZOFRAN) 8 MG tablet Take 8 mg by mouth daily as needed for nausea or vomiting.   Yes Historical Provider, MD  pramipexole (MIRAPEX) 1 MG tablet Take 1 mg by mouth at bedtime.   Yes Historical Provider, MD  pregabalin (LYRICA) 75 MG capsule Take 75 mg by mouth 2 (two) times daily as needed (for excessive pain).    Yes Historical Provider, MD     Allergies  Allergen Reactions  . Omnicef [Cefdinir] Swelling    Swelling of tongue  . Codeine Other (See Comments)    Stomach aches  . Sulfa Antibiotics Nausea And Vomiting  . Amoxicillin Rash and Other (See Comments)    vomitting  . Penicillins Rash and Other (See Comments)    vomitting     Social History:  reports that she has been smoking Cigarettes.  She has a 15 pack-year smoking history. She has never used smokeless tobacco. She reports that she does not drink alcohol or use illicit drugs.     Family History  Problem Relation Age of Onset  . Hypertension Father   . Diabetes Father   . Cancer Other   . Diabetes Other   . Seizures Father        Physical Exam:  GEN:  Agitated Obese 33 y.o. Caucasian  female examined and in no acute distress; cooperative with exam Filed Vitals:   12/13/13 0053 12/13/13 0114 12/13/13 0130 12/13/13 0330  BP: 146/103  131/73 123/77  Pulse: 80  74 76  Temp: 97.9 F (36.6 C) 98.2 F (36.8 C)    TempSrc: Oral Oral    Resp: 18 13 12 24   SpO2: 97% 98% 96% 95%   Blood pressure 123/77, pulse 76, temperature 98.2 F (36.8 C), temperature source Oral, resp. rate 24, last menstrual period 12/06/2013, SpO2 95.00%. PSYCH: She is alert and  oriented x4;  Appears anxious and Depressed;  HEENT: Normocephalic and Atraumatic, Mucous membranes pink; PERRLA; EOM intact; Fundi:  Benign;  No scleral icterus, Nares: Patent, Oropharynx: Clear, Edentulous, Neck:  FROM, no cervical lymphadenopathy nor thyromegaly or carotid bruit; no JVD; Breasts:: Not examined CHEST WALL: No tenderness CHEST: Normal respiration, clear to auscultation bilaterally HEART: Regular rate and rhythm; no murmurs rubs or gallops BACK: No kyphosis or scoliosis; no CVA tenderness ABDOMEN: Positive Bowel Sounds, Obese, soft non-tender; no masses, no organomegaly. Rectal Exam: Not done EXTREMITIES: No cyanosis, clubbing or edema; no ulcerations. Genitalia: not examined PULSES: 2+ and symmetric SKIN: Normal hydration no rash or ulceration CNS: Neurologic Examination:  Mental Status:  Alert, oriented, thought content appropriate.  Speech fluent without evidence of aphasia. Able to follow 3 step commands without difficulty. In No obvious pain.  Cranial Nerves:  II: Discs flat bilaterally; Visual fields Intact,  orr Decreased peripheral vision to the left or right. Pupils equal and reactive.  III,IV, VI: right ptosis, extra-ocular motions intact bilaterally  V,VII: smile symmetric, facial light touch sensation normal bilaterally  VIII: hearing decreasesd bilaterally  IX,X: gag reflex present  XI: bilateral shoulder shrug  XII: midline tongue extension  Sensory: Pinprick and light touch intact throughout, bilaterally Motor: Right : Upper extremity 5/5 Left: Upper extremity 5/5  Lower extremity 5/5 Lower extremity 5/5  Tone and bulk:normal tone throughout; no atrophy noted   Deep Tendon Reflexes: 2+ and symmetric throughout  Plantars/ Babinski: Right: normal Left: normal   Cerebellar: Finger to nose with or without difficulty.  Gait: deferred  Vascular: pulses palpable throughout    Labs on Admission:  Basic Metabolic Panel:  Recent Labs Lab 12/13/13 0410  NA  141  K 3.5*  CL 106  GLUCOSE 109*  BUN 12  CREATININE 0.90   Liver Function Tests: No results found for this basename: AST, ALT, ALKPHOS, BILITOT, PROT, ALBUMIN,  in the last 168 hours No results found for this basename: LIPASE, AMYLASE,  in the last 168 hours No results found for this basename: AMMONIA,  in the last 168 hours CBC:  Recent Labs Lab 12/13/13 0124 12/13/13 0410  WBC 10.1  --   HGB 14.8 13.9  HCT 44.4 41.0  MCV 86.4  --   PLT 312  --    Cardiac Enzymes: No results found for this basename: CKTOTAL, CKMB, CKMBINDEX, TROPONINI,  in the last 168 hours  BNP (last 3 results) No results found for this basename: PROBNP,  in the last 8760 hours CBG: No results found for this basename: GLUCAP,  in the last 168 hours  Radiological Exams on Admission: Ct Head Wo Contrast  12/13/2013   CLINICAL DATA:  Seizures.  EXAM: CT HEAD WITHOUT CONTRAST  TECHNIQUE: Contiguous axial images were obtained from the base of the skull through the vertex without intravenous contrast.  COMPARISON:  MR HEAD WO/W CM dated 09/04/2013; CT HEAD W/O CM dated 08/09/2013  FINDINGS: There is no evidence of acute infarction, mass lesion, or intra- or extra-axial hemorrhage on CT.  The posterior fossa, including the cerebellum, brainstem and fourth ventricle, is within normal limits. The third and lateral ventricles, and basal ganglia are unremarkable in appearance. The cerebral hemispheres are symmetric in appearance, with normal gray-white differentiation. No mass effect or midline shift is seen.  There is no evidence of fracture; visualized osseous structures are unremarkable in appearance. The orbits are within normal limits. The paranasal sinuses and mastoid air cells are well-aerated. No significant soft tissue abnormalities are seen.  IMPRESSION: Unremarkable noncontrast CT of the head.   Electronically Signed   By: Roanna RaiderJeffery  Chang M.D.   On: 12/13/2013 02:50     EKG: Independently reviewed.     Assessment/Plan:   33 y.o. female with  Principal Problem:   Seizures Active Problems:   Seizure   Chronic Pain   IBS   Restless leg Syndrome    Seizures-  Admit to Telemetry Bed, on Seizure Precautions, Loaded with IV Keppra, and to continue with Keppra 1000 mg IV/PO q 12 hours with PRN IV Ativan for active seizures.   EEG study ordered and Neurology to see in AM.     Chronic Pain- Pain Control with IV  Dilaudid PRN.     Fibromyalgia-  Pain control with PRN IV Dilaudid.   Continue Lyrica Rx and Soma PRN.    IBS- stable.    Restless Leg Syndrome - on Mirapex.    DVT prophylaxis with Lovenox.      Code Status:    FULL CODE Family Communication:   Fiance at Bedside Disposition Plan:     Inpatient  Time spent:  39 Minutes  Ron Parker [facility] Pager [phone]  If 7PM-7AM, please contact night-coverage [email or web address] Password [email or web address] 12/13/2013, 4:56 AM

## 2013-12-13 NOTE — ED Notes (Signed)
Pt reported to me that she experienced another seizure, her boyfriend called out and no one came.  When I asked the Secretary about it, she told me that she saw the patient shaking her feet, then she repositioned herself in the bed, then began shaking again; then stopped shaking, sat up in bed and yelled out the door at the secretary.  The secretary spoke to another RN at the desk who peeked in at the patient.

## 2013-12-14 LAB — URINE CULTURE: Colony Count: 60000

## 2013-12-15 LAB — GLUCOSE, CAPILLARY: Glucose-Capillary: 117 mg/dL — ABNORMAL HIGH (ref 70–99)

## 2014-02-22 ENCOUNTER — Emergency Department (HOSPITAL_COMMUNITY): Payer: Medicaid Other

## 2014-02-22 ENCOUNTER — Emergency Department (HOSPITAL_COMMUNITY)
Admission: EM | Admit: 2014-02-22 | Discharge: 2014-02-23 | Disposition: A | Discharge status: Home / Self Care | Payer: Medicaid Other | Source: Home / Self Care | Attending: Emergency Medicine | Admitting: Emergency Medicine

## 2014-02-22 ENCOUNTER — Encounter (HOSPITAL_COMMUNITY): Payer: Self-pay | Admitting: Emergency Medicine

## 2014-02-22 ENCOUNTER — Emergency Department (HOSPITAL_COMMUNITY)
Admission: EM | Admit: 2014-02-22 | Discharge: 2014-02-22 | Disposition: A | Discharge status: Home / Self Care | Payer: Medicaid Other | Attending: Emergency Medicine | Admitting: Emergency Medicine

## 2014-02-22 DIAGNOSIS — G40909 Epilepsy, unspecified, not intractable, without status epilepticus: Secondary | ICD-10-CM

## 2014-02-22 DIAGNOSIS — G43909 Migraine, unspecified, not intractable, without status migrainosus: Secondary | ICD-10-CM | POA: Insufficient documentation

## 2014-02-22 DIAGNOSIS — F411 Generalized anxiety disorder: Secondary | ICD-10-CM

## 2014-02-22 DIAGNOSIS — Z9089 Acquired absence of other organs: Secondary | ICD-10-CM

## 2014-02-22 DIAGNOSIS — K219 Gastro-esophageal reflux disease without esophagitis: Secondary | ICD-10-CM | POA: Insufficient documentation

## 2014-02-22 DIAGNOSIS — IMO0001 Reserved for inherently not codable concepts without codable children: Secondary | ICD-10-CM | POA: Insufficient documentation

## 2014-02-22 DIAGNOSIS — G8929 Other chronic pain: Secondary | ICD-10-CM | POA: Insufficient documentation

## 2014-02-22 DIAGNOSIS — Z95818 Presence of other cardiac implants and grafts: Secondary | ICD-10-CM

## 2014-02-22 DIAGNOSIS — G2581 Restless legs syndrome: Secondary | ICD-10-CM | POA: Insufficient documentation

## 2014-02-22 DIAGNOSIS — Z87442 Personal history of urinary calculi: Secondary | ICD-10-CM | POA: Insufficient documentation

## 2014-02-22 DIAGNOSIS — Z880 Allergy status to penicillin: Secondary | ICD-10-CM | POA: Insufficient documentation

## 2014-02-22 DIAGNOSIS — F17200 Nicotine dependence, unspecified, uncomplicated: Secondary | ICD-10-CM | POA: Insufficient documentation

## 2014-02-22 DIAGNOSIS — Z8669 Personal history of other diseases of the nervous system and sense organs: Secondary | ICD-10-CM

## 2014-02-22 DIAGNOSIS — Z8639 Personal history of other endocrine, nutritional and metabolic disease: Secondary | ICD-10-CM | POA: Insufficient documentation

## 2014-02-22 DIAGNOSIS — Z79899 Other long term (current) drug therapy: Secondary | ICD-10-CM | POA: Insufficient documentation

## 2014-02-22 DIAGNOSIS — N200 Calculus of kidney: Secondary | ICD-10-CM

## 2014-02-22 DIAGNOSIS — Z8739 Personal history of other diseases of the musculoskeletal system and connective tissue: Secondary | ICD-10-CM

## 2014-02-22 DIAGNOSIS — Z3202 Encounter for pregnancy test, result negative: Secondary | ICD-10-CM | POA: Insufficient documentation

## 2014-02-22 DIAGNOSIS — Z862 Personal history of diseases of the blood and blood-forming organs and certain disorders involving the immune mechanism: Secondary | ICD-10-CM | POA: Insufficient documentation

## 2014-02-22 DIAGNOSIS — N201 Calculus of ureter: Secondary | ICD-10-CM | POA: Insufficient documentation

## 2014-02-22 HISTORY — DX: Other chronic pain: G89.29

## 2014-02-22 HISTORY — DX: Unspecified abdominal pain: R10.9

## 2014-02-22 LAB — URINALYSIS, ROUTINE W REFLEX MICROSCOPIC
BILIRUBIN URINE: NEGATIVE
GLUCOSE, UA: NEGATIVE mg/dL
Ketones, ur: NEGATIVE mg/dL
Nitrite: NEGATIVE
PROTEIN: NEGATIVE mg/dL
Specific Gravity, Urine: 1.025 (ref 1.005–1.030)
UROBILINOGEN UA: 0.2 mg/dL (ref 0.0–1.0)
pH: 6 (ref 5.0–8.0)

## 2014-02-22 LAB — CBC WITH DIFFERENTIAL/PLATELET
BASOS ABS: 0.1 10*3/uL (ref 0.0–0.1)
BASOS PCT: 0 % (ref 0–1)
Eosinophils Absolute: 0.4 10*3/uL (ref 0.0–0.7)
Eosinophils Relative: 4 % (ref 0–5)
HCT: 45 % (ref 36.0–46.0)
HEMOGLOBIN: 14.6 g/dL (ref 12.0–15.0)
Lymphocytes Relative: 34 % (ref 12–46)
Lymphs Abs: 3.9 10*3/uL (ref 0.7–4.0)
MCH: 28.9 pg (ref 26.0–34.0)
MCHC: 32.4 g/dL (ref 30.0–36.0)
MCV: 88.9 fL (ref 78.0–100.0)
MONOS PCT: 8 % (ref 3–12)
Monocytes Absolute: 0.9 10*3/uL (ref 0.1–1.0)
NEUTROS ABS: 6.2 10*3/uL (ref 1.7–7.7)
NEUTROS PCT: 54 % (ref 43–77)
Platelets: 292 10*3/uL (ref 150–400)
RBC: 5.06 MIL/uL (ref 3.87–5.11)
RDW: 14.4 % (ref 11.5–15.5)
WBC: 11.5 10*3/uL — ABNORMAL HIGH (ref 4.0–10.5)

## 2014-02-22 LAB — BASIC METABOLIC PANEL
BUN: 12 mg/dL (ref 6–23)
CO2: 25 mEq/L (ref 19–32)
Calcium: 9.4 mg/dL (ref 8.4–10.5)
Chloride: 101 mEq/L (ref 96–112)
Creatinine, Ser: 0.71 mg/dL (ref 0.50–1.10)
GFR calc non Af Amer: >90 mL/min (ref >90)
Glucose, Bld: 80 mg/dL (ref 70–99)
POTASSIUM: 4.4 meq/L (ref 3.7–5.3)
Sodium: 138 mEq/L (ref 137–147)

## 2014-02-22 LAB — URINE MICROSCOPIC-ADD ON

## 2014-02-22 LAB — PREGNANCY, URINE: Preg Test, Ur: NEGATIVE

## 2014-02-22 MED ORDER — HYDROMORPHONE HCL PF 1 MG/ML IJ SOLN
1.0000 mg | Freq: Once | INTRAMUSCULAR | Status: AC
  Administered 2014-02-22: 1 mg via INTRAVENOUS
  Filled 2014-02-22: qty 1

## 2014-02-22 MED ORDER — TAMSULOSIN HCL 0.4 MG PO CAPS: 0.4000 mg | ORAL_CAPSULE | Freq: Every day | ORAL | Status: DC

## 2014-02-22 MED ORDER — ONDANSETRON HCL 4 MG/2ML IJ SOLN
4.0000 mg | Freq: Once | INTRAMUSCULAR | Status: AC
  Administered 2014-02-22: 4 mg via INTRAVENOUS
  Filled 2014-02-22: qty 2

## 2014-02-22 MED ORDER — KETOROLAC TROMETHAMINE 30 MG/ML IJ SOLN
30.0000 mg | Freq: Once | INTRAMUSCULAR | Status: AC
  Administered 2014-02-22: 30 mg via INTRAVENOUS
  Filled 2014-02-22: qty 1

## 2014-02-22 MED ORDER — SODIUM CHLORIDE 0.9 % IV BOLUS (SEPSIS)
1000.0000 mL | Freq: Once | INTRAVENOUS | Status: AC
  Administered 2014-02-22: 1000 mL via INTRAVENOUS

## 2014-02-22 MED ORDER — KETOROLAC TROMETHAMINE 30 MG/ML IJ SOLN
INTRAMUSCULAR | Status: AC
  Administered 2014-02-22: 30 mg via INTRAVENOUS
  Filled 2014-02-22: qty 1

## 2014-02-22 NOTE — ED Provider Notes (Signed)
CSN: [id number]     Arrival date & time 02/22/14  1407 History   First MD Initiated Contact with Patient 02/22/14 1505    This chart was scribed for Joya Gaskins, MD by Marica Otter, ED Scribe. This patient was seen in room APA14/APA14 and the patient's care was started at 3:33 PM.  PCP: [name], MD  Chief Complaint  Patient presents with  . Flank Pain   HPI HPI Comments: Isabella Buchanan is a 33 y.o. female, with a history of GERD, fibromyalgia, and kidney stones, who presents to the Emergency Department complaining of sudden, worsening left sided flank pain onset 2 hours ago. Pt describes the pain as a sharp pain. Pt denies fever, SOB, cough or vomiting.   Past Medical History  Diagnosis Date  . Anxiety   . Fibromyalgia   . Restless leg   . Chronic back pain   . GERD (gastroesophageal reflux disease)   . Headache(784.0)     mygrains  . PONV (postoperative nausea and vomiting)   . Interstitial cystitis   . Polycystic ovarian syndrome   . Hiatal hernia   . Seizures   . IBS (irritable bowel syndrome)   . Chronic pain   . Chronic flank pain   . Kidney stones    Past Surgical History  Procedure Laterality Date  . Cholecystectomy    . Tonsillectomy    . Tubal ligation    . Tympanoplasty    . Esophagogastroduodenoscopy endoscopy     Family History  Problem Relation Age of Onset  . Hypertension Father   . Diabetes Father   . Cancer Other   . Diabetes Other   . Seizures Father    History  Substance Use Topics  . Smoking status: Current Every Day Smoker -- 1.00 packs/day for 15 years    Types: Cigarettes  . Smokeless tobacco: Never Used  . Alcohol Use: No   OB History   Grav Para Term Preterm Abortions TAB SAB Ect Mult Living   2 2  2      2      Review of Systems  Constitutional: Negative for fever.  Respiratory: Negative for cough and shortness of breath.   Cardiovascular: Negative for chest pain.  Gastrointestinal: Negative for vomiting.   Genitourinary: Positive for flank pain (left).  Neurological: Negative for weakness.  All other systems reviewed and are negative.      Allergies  Omnicef; Codeine; Sulfa antibiotics; Amoxicillin; and Penicillins  Home Medications   Current Outpatient Rx  Name  Route  Sig  Dispense  Refill  . carisoprodol (SOMA) 350 MG tablet   Oral   Take 350 mg by mouth daily.          Marland Kitchen dexlansoprazole (DEXILANT) 60 MG capsule   Oral   Take 60 mg by mouth daily as needed (for acid reflux).          Marland Kitchen diphenhydrAMINE (BENADRYL) 25 MG tablet   Oral   Take 50 mg by mouth every 4 (four) hours as needed for allergies.          Marland Kitchen escitalopram (LEXAPRO) 10 MG tablet   Oral   Take 10 mg by mouth daily.         Marland Kitchen HYDROcodone-acetaminophen (NORCO) 10-325 MG per tablet   Oral   Take 1 tablet by mouth every 6 (six) hours as needed for moderate pain or severe pain.          Marland Kitchen ibuprofen (ADVIL,MOTRIN)  200 MG tablet   Oral   Take 800 mg by mouth every 6 (six) hours as needed for pain.          . Lacosamide (VIMPAT) 150 MG TABS   Oral   Take 150 mg by mouth 2 (two) times daily.         Marland Kitchen. LORazepam (ATIVAN) 1 MG tablet   Oral   Take 1 mg by mouth 3 (three) times daily as needed for anxiety.         . ondansetron (ZOFRAN) 8 MG tablet   Oral   Take 8 mg by mouth daily as needed for nausea or vomiting.         . pramipexole (MIRAPEX) 1 MG tablet   Oral   Take 1 mg by mouth at bedtime.         . pregabalin (LYRICA) 75 MG capsule   Oral   Take 75 mg by mouth 2 (two) times daily as needed (for excessive pain).           Triage Vitals: BP 133/89  Pulse 112  Temp(Src) 98.6 F (37 C) (Oral)  Resp 20  Ht 5\' 6"  (1.676 m)  Wt 192 lb (87.091 kg)  BMI 31.00 kg/m2  SpO2 98%  LMP 02/04/2014 Physical Exam CONSTITUTIONAL: Well developed/well nourished, uncomfortable appearing HEAD: Normocephalic/atraumatic EYES: EOMI/PERRL ENMT: Mucous membranes moist NECK: supple no  meningeal signs SPINE:entire spine nontender CV: S1/S2 noted, no murmurs/rubs/gallops noted LUNGS: Lungs are clear to auscultation bilaterally, no apparent distress ABDOMEN: soft, nontender, no rebound or guarding GU: Left CVA tenderness NEURO: Pt is awake/alert, moves all extremitiesx4 EXTREMITIES: pulses normal, full ROM SKIN: warm, color normal PSYCH: no abnormalities of mood noted  ED Course  Procedures  DIAGNOSTIC STUDIES: Oxygen Saturation is 98% on RA, normal by my interpretation.    COORDINATION OF CARE: 3:36 PM-Discussed treatment plan, which includes pain meds, IV fluids, and possible admittance to the hospital, with pt at bedside and pt agreed to plan.   4:55 PM Pt now reports all of her pain is resolved.  She would like to be discharged.  She will call her urologist tomorrow.  She reports she utilized flomax previously with success Stable for d/c home Labs Review Labs Reviewed  URINALYSIS, ROUTINE W REFLEX MICROSCOPIC - Abnormal; Notable for the following:    APPearance HAZY (*)    Hgb urine dipstick TRACE (*)    Leukocytes, UA SMALL (*)    All other components within normal limits  URINE MICROSCOPIC-ADD ON - Abnormal; Notable for the following:    Squamous Epithelial / LPF MANY (*)    Bacteria, UA MANY (*)    Crystals CA OXALATE CRYSTALS (*)    All other components within normal limits  CBC WITH DIFFERENTIAL - Abnormal; Notable for the following:    WBC 11.5 (*)    All other components within normal limits  PREGNANCY, URINE  BASIC METABOLIC PANEL   Imaging Review Ct Abdomen Pelvis Wo Contrast  02/22/2014   CLINICAL DATA:  Flank pain  EXAM: CT ABDOMEN AND PELVIS WITHOUT CONTRAST  TECHNIQUE: Multidetector CT imaging of the abdomen and pelvis was performed following the standard protocol without oral or intravenous contrast maternal administration.  COMPARISON:  July 12, 2013  FINDINGS: Lung bases are clear. 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 It has a benign appearance.  Liver  is enlarged, measuring 19.6 cm in length. No focal liver lesions are identified on this noncontrast enhanced study. Gallbladder is absent. There is no biliary duct dilatation.  Spleen, pancreas, and adrenals appear normal.  There is a 1 mm calculus in the mid right kidney. There is no right renal hydronephrosis or mass. There is no right-sided ureteral calculus or ureterectasis.  On the left, there are two, 2 mm calculi in the upper pole region. There is a 3 mm calculus in the mid left kidney as well as a 5 mm calculus in the mid to lower pole left kidney. There is moderate hydronephrosis on the left. There is no left renal mass. There is a 4 mm calculus, irregular in contour, in the proximal ureter at the level of L4 on the left. No other ureteral calculi are identified.  In the pelvis, there it bladder is midline with normal wall thickness. There is fluid tracking from the right ovary into the cul-de-sac and and toward the left posterior to the uterus. No pelvic mass is appreciable on this study. Appendix appears normal.  There is no bowel obstruction. No free air or portal venous air. There is no adenopathy or abscess in the abdomen or pelvis. There is scarring in the periumbilical region, best noted on sagittal view. There is no abdominal aortic aneurysm. There are no blastic or lytic bone lesions.  IMPRESSION: 4 mm calculus in the proximal left ureter causing moderate hydronephrosis. There are nonobstructing calculi in each kidney.  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  There is fluid in the cul-de-sac, probably due to recent ovarian cyst rupture. No pelvic mass appreciable.  Appendix appears normal.  No bowel obstruction.  No abscess.  Gallbladder absent.   Electronically Signed   By: Bretta Bang M.D.   On:  02/22/2014 15:29      MDM   Final diagnoses:  Ureteral stone    Nursing notes including past medical history and social history reviewed and considered in documentation Labs/vital reviewed and considered Previous records reviewed and considered   I personally performed the services described in this documentation, which was scribed in my presence. The recorded information has been reviewed and is accurate.       Joya Gaskins, MD 02/22/14 [phone]

## 2014-02-22 NOTE — ED Notes (Signed)
Pt c/o left sided flank pain x 1 hour with nausea.  Reports history of kidney stones.

## 2014-02-22 NOTE — ED Notes (Signed)
Pt received discharge instructions and prescriptions, verbalized understanding and has no further questions. Pt ambulated to exit in stable condition accompanied by friend.  Friend to drive pt home. Advised to return to emergency department with new or worsening symptoms.

## 2014-02-22 NOTE — ED Provider Notes (Signed)
CSN: [id number]     Arrival date & time 02/22/14  2329 History   First MD Initiated Contact with Patient 02/22/14 2332     Chief Complaint  Patient presents with  . Flank Pain     (Consider location/radiation/quality/duration/timing/severity/associated sxs/prior Treatment) HPI Comments: Pt is a 33 y/o female - hx of kidney stones - some requiring stent placement in the past who presents with a complaint of L flank pain - this was initially diagnosed earlier on 02/22/14 when she had an ED visit with a CT scan, UA and BMP - showed a 4mm proximal ureteral stone.  She received 2 mg of dilaudid and improved - requested d/c.  When she went home, she had recurrent pain, she took her vicodin and had no relief, zofran without relief and returned for recheck.  She has no fever and states that the pain is sharp and stabbing, located in the L flank and side and no radiation to the abdomen or pelvis or groin.  Not positional.    The history is provided by the patient.    Past Medical History  Diagnosis Date  . Anxiety   . Fibromyalgia   . Restless leg   . Chronic back pain   . GERD (gastroesophageal reflux disease)   . Headache(784.0)     mygrains  . PONV (postoperative nausea and vomiting)   . Interstitial cystitis   . Polycystic ovarian syndrome   . Hiatal hernia   . Seizures   . IBS (irritable bowel syndrome)   . Chronic pain   . Chronic flank pain   . Kidney stones    Past Surgical History  Procedure Laterality Date  . Cholecystectomy    . Tonsillectomy    . Tubal ligation    . Tympanoplasty    . Esophagogastroduodenoscopy endoscopy     Family History  Problem Relation Age of Onset  . Hypertension Father   . Diabetes Father   . Cancer Other   . Diabetes Other   . Seizures Father    History  Substance Use Topics  . Smoking status: Current Every Day Smoker -- 1.00 packs/day for 15 years    Types: Cigarettes  . Smokeless tobacco: Never Used  . Alcohol Use: No   OB History   Grav Para Term Preterm Abortions TAB SAB Ect Mult Living   2 2  2      2      Review of Systems  All other systems reviewed and are negative.      Allergies  Omnicef; Codeine; Sulfa antibiotics; Amoxicillin; and Penicillins  Home Medications   Current Outpatient Rx  Name  Route  Sig  Dispense  Refill  . carisoprodol (SOMA) 350 MG tablet   Oral   Take 350 mg by mouth daily.          Marland Kitchen. dexlansoprazole (DEXILANT) 60 MG capsule   Oral   Take 60 mg by mouth daily as needed (for acid reflux).          Marland Kitchen. diphenhydrAMINE (BENADRYL) 25 MG tablet   Oral   Take 50 mg by mouth every 4 (four) hours as needed for allergies.          Marland Kitchen. escitalopram (LEXAPRO) 10 MG tablet   Oral   Take 10 mg by mouth daily.         Marland Kitchen. HYDROcodone-acetaminophen (NORCO) 10-325 MG per tablet   Oral   Take 1 tablet by mouth every 6 (six) hours as needed  for moderate pain or severe pain.          Marland Kitchen ibuprofen (ADVIL,MOTRIN) 200 MG tablet   Oral   Take 800 mg by mouth every 6 (six) hours as needed for pain.          Marland Kitchen ketorolac (TORADOL) 10 MG tablet   Oral   Take 1 tablet (10 mg total) by mouth every 6 (six) hours as needed.   20 tablet   0   . Lacosamide (VIMPAT) 150 MG TABS   Oral   Take 150 mg by mouth 2 (two) times daily.         Marland Kitchen LORazepam (ATIVAN) 1 MG tablet   Oral   Take 1 mg by mouth 3 (three) times daily as needed for anxiety.         . ondansetron (ZOFRAN) 8 MG tablet   Oral   Take 8 mg by mouth daily as needed for nausea or vomiting.         . pramipexole (MIRAPEX) 1 MG tablet   Oral   Take 1 mg by mouth at bedtime.         . pregabalin (LYRICA) 75 MG capsule   Oral   Take 75 mg by mouth 2 (two) times daily as needed (for excessive pain).          . tamsulosin (FLOMAX) 0.4 MG CAPS capsule   Oral   Take 1 capsule (0.4 mg total) by mouth daily after supper.   14 capsule   0    BP 125/92  Pulse 107  Temp(Src) 98 F (36.7 C) (Oral)  Resp 18  Ht 5'  6" (1.676 m)  Wt 192 lb (87.091 kg)  BMI 31.00 kg/m2  SpO2 97%  LMP 02/04/2014 Physical Exam  Nursing note and vitals reviewed. Constitutional: She appears well-developed and well-nourished.  Uncomfortable appearing, non toxic  HENT:  Head: Normocephalic and atraumatic.  Mouth/Throat: Oropharynx is clear and moist. No oropharyngeal exudate.  Eyes: Conjunctivae and EOM are normal. Pupils are equal, round, and reactive to light. Right eye exhibits no discharge. Left eye exhibits no discharge. No scleral icterus.  Neck: Normal range of motion. Neck supple. No JVD present. No thyromegaly present.  Cardiovascular: Normal rate, regular rhythm, normal heart sounds and intact distal pulses.  Exam reveals no gallop and no friction rub.   No murmur heard. Pulmonary/Chest: Effort normal and breath sounds normal. No respiratory distress.  Abdominal: Soft. Bowel sounds are normal. She exhibits no distension. There is no tenderness.  L CVA ttp  Musculoskeletal: Normal range of motion. She exhibits no edema and no tenderness.  Lymphadenopathy:    She has no cervical adenopathy.  Neurological: She is alert. Coordination normal.  Skin: Skin is warm and dry. No rash noted. No erythema.  Psychiatric: She has a normal mood and affect. Her behavior is normal.    ED Course  Procedures (including critical care time) Labs Review Labs Reviewed - No data to display Imaging Review Ct Abdomen Pelvis Wo Contrast  02/22/2014   CLINICAL DATA:  Flank pain  EXAM: CT ABDOMEN AND PELVIS WITHOUT CONTRAST  TECHNIQUE: Multidetector CT imaging of the abdomen and pelvis was performed following the standard protocol without oral or intravenous contrast maternal administration.  COMPARISON:  July 12, 2013  FINDINGS: Lung bases are clear. 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 It has  a benign appearance.  Liver is  enlarged, measuring 19.6 cm in length. No focal liver lesions are identified on this noncontrast enhanced study. Gallbladder is absent. There is no biliary duct dilatation.  Spleen, pancreas, and adrenals appear normal.  There is a 1 mm calculus in the mid right kidney. There is no right renal hydronephrosis or mass. There is no right-sided ureteral calculus or ureterectasis.  On the left, there are two, 2 mm calculi in the upper pole region. There is a 3 mm calculus in the mid left kidney as well as a 5 mm calculus in the mid to lower pole left kidney. There is moderate hydronephrosis on the left. There is no left renal mass. There is a 4 mm calculus, irregular in contour, in the proximal ureter at the level of L4 on the left. No other ureteral calculi are identified.  In the pelvis, there it bladder is midline with normal wall thickness. There is fluid tracking from the right ovary into the cul-de-sac and and toward the left posterior to the uterus. No pelvic mass is appreciable on this study. Appendix appears normal.  There is no bowel obstruction. No free air or portal venous air. There is no adenopathy or abscess in the abdomen or pelvis. There is scarring in the periumbilical region, best noted on sagittal view. There is no abdominal aortic aneurysm. There are no blastic or lytic bone lesions.  IMPRESSION: 4 mm calculus in the proximal left ureter causing moderate hydronephrosis. There are nonobstructing calculi in each kidney.  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  There is fluid in the cul-de-sac, probably due to recent ovarian cyst rupture. No pelvic mass appreciable.  Appendix appears normal.  No bowel obstruction.  No abscess.  Gallbladder absent.   Electronically Signed   By: Bretta Bang M.D.   On: 02/22/2014 15:29     MDM   Final diagnoses:  Kidney stone on left side    The pt has evidence of hydronephrosis on CT today - she  was seen 8 hours ago - I don't suspect that there is any change in renal function since that time, UA had no infection on prior test today.  There is ongoing pain - she did not have toradol on prior visit and denies allergy to NSAIDs.  Will administer dilaudid, toradol and zofran, reeval.  She has a urolgist  - she was going to call in the morning.  Pt has been improving on meds, 5/10 pain and much more tolerable after initial meds - she requests one more dose of med and d/c to f/u with her Urologist in the AM.  She also requests a toradol Rx stating that this gave her much more relief than other meds.  I agree that at this time the pt appaers stable for d/c.  Meds given in ED:  Medications  HYDROmorphone (DILAUDID) injection 1 mg (1 mg Intravenous Given 02/22/14 2350)  ketorolac (TORADOL) 30 MG/ML injection 30 mg (30 mg Intravenous Given 02/22/14 2359)  ondansetron (ZOFRAN) injection 4 mg (4 mg Intravenous Given 02/22/14 2351)  HYDROmorphone (DILAUDID) injection 0.5 mg (0.5 mg Intravenous Given 02/23/14 0209)    New Prescriptions   KETOROLAC (TORADOL) 10 MG TABLET    Take 1 tablet (10 mg total) by mouth every 6 (six) hours as needed.      Vida Roller, MD 02/23/14 [phone]

## 2014-02-22 NOTE — Discharge Instructions (Signed)
Kidney Stones Kidney stones (urolithiasis) are solid masses that form inside your kidneys. The intense pain is caused by the stone moving through the kidney, ureter, bladder, and urethra (urinary tract). When the stone moves, the ureter starts to spasm around the stone. The stone is usually passed in your pee (urine).  HOME CARE  Drink enough fluids to keep your pee clear or pale yellow. This helps to get the stone out.  Strain all pee through the provided strainer. Do not pee without peeing through the strainer, not even once. If you pee the stone out, catch it in the strainer. The stone may be as small as a grain of salt. Take this to your doctor. This will help your doctor figure out what you can do to try to prevent more kidney stones.  Only take medicine as told by your doctor.  Follow up with your doctor as told.  Get follow-up X-rays as told by your doctor. GET HELP IF: You have pain that gets worse even if you have been taking pain medicine. GET HELP RIGHT AWAY IF:   Your pain does not get better with medicine.  You have a fever or shaking chills.  Your pain increases and gets worse over 18 hours.  You have new belly (abdominal) pain.  You feel faint or pass out.  You are unable to pee. MAKE SURE YOU:   Understand these instructions.  Will watch your condition.  Will get help right away if you are not doing well or get worse. Document Released: 04/24/2008 Document Revised: 07/09/2013 Document Reviewed: 04/09/2013 [employer] Patient Information 2014 [employer].  

## 2014-02-23 MED ORDER — HYDROMORPHONE HCL PF 1 MG/ML IJ SOLN
0.5000 mg | Freq: Once | INTRAMUSCULAR | Status: AC
  Administered 2014-02-23: 0.5 mg via INTRAVENOUS
  Filled 2014-02-23: qty 1

## 2014-02-23 MED ORDER — KETOROLAC TROMETHAMINE 10 MG PO TABS: 10.0000 mg | ORAL_TABLET | Freq: Four times a day (QID) | ORAL | Status: DC | PRN

## 2014-02-23 NOTE — Discharge Instructions (Signed)
Your exam and or your xrays have shown that you likely have a kidney stone.  You should follow up with the Urologist of your choosing or the Urologist listed above in the next 2-3 days if you have not passed the stone.  You should urinate in to the strainer until you pass the stone.    Flomax helps with passing the stone by opening up the Ureters (tubes), Vicodin and an antiinflammatory for pain if you are not allergic to these medicines.  Phenergan or Zofran for nausea.  Return to the ER for severe or worsening pain, vomiting or fevers or if you are unable to control your pain with the medicines provided.  Kidney Stones Kidney stones (ureteral lithiasis) are deposits that form inside your kidneys. The intense pain is caused by the stone moving through the urinary tract. When the stone moves, the ureter goes into spasm around the stone. The stone is usually passed in the urine.  CAUSES  A disorder that makes certain neck glands produce too much parathyroid hormone (primary hyperparathyroidism).  A buildup of uric acid crystals.  Narrowing (stricture) of the ureter.  A kidney obstruction present at birth (congenital obstruction).  Previous surgery on the kidney or ureters.  Numerous kidney infections.  SYMPTOMS  Feeling sick to your stomach (nauseous).  Throwing up (vomiting).  Blood in the urine (hematuria).  Pain that usually spreads (radiates) to the groin.  Frequency or urgency of urination.  DIAGNOSIS  Taking a history and physical exam.  Blood or urine tests.  Computerized X-ray scan (CT scan).  Occasionally, an examination of the inside of the urinary bladder (cystoscopy) is performed.  TREATMENT  Observation.  Increasing your fluid intake.  Surgery may be needed if you have severe pain or persistent obstruction.  The size, location, and chemical composition are all important variables that will determine the proper choice of action for you. Talk to your caregiver to better  understand your situation so that you will minimize the risk of injury to yourself and your kidney.  HOME CARE INSTRUCTIONS  Drink enough water and fluids to keep your urine clear or pale yellow.  Strain all urine through the provided strainer. Keep all particulate matter and stones for your caregiver to see. The stone causing the pain may be as small as a grain of salt. It is very important to use the strainer each and every time you pass your urine. The collection of your stone will allow your caregiver to analyze it and verify that a stone has actually passed.  Only take over-the-counter or prescription medicines for pain, discomfort, or fever as directed by your caregiver.  Make a follow-up appointment with your caregiver as directed.  Get follow-up X-rays if required. The absence of pain does not always mean that the stone has passed. It may have only stopped moving. If the urine remains completely obstructed, it can cause loss of kidney function or even complete destruction of the kidney. It is your responsibility to make sure X-rays and follow-ups are completed. Ultrasounds of the kidney can show blockages and the status of the kidney. Ultrasounds are not associated with any radiation and can be performed easily in a matter of minutes.  SEEK IMMEDIATE MEDICAL CARE IF:  Pain cannot be controlled with the prescribed medicine.  You have a fever.  The severity or intensity of pain increases over 18 hours and is not relieved by pain medicine.  You develop a new onset of abdominal pain.  You   feel faint or pass out.  MAKE SURE YOU:  Understand these instructions.  Will watch your condition.  Will get help right away if you are not doing well or get worse.  Document Released: 11/06/2005 Document Revised: 10/26/2011 Document Reviewed: 03/04/2010 [employer] Patient Information 2012 [employer].    

## 2014-02-23 NOTE — ED Notes (Signed)
Upon entering room pt sleeping peacefully, states she is still having flank pain.

## 2014-03-09 ENCOUNTER — Emergency Department (HOSPITAL_COMMUNITY)
Admission: EM | Admit: 2014-03-09 | Discharge: 2014-03-10 | Disposition: A | Discharge status: Home / Self Care | Payer: Medicaid Other | Attending: Emergency Medicine | Admitting: Emergency Medicine

## 2014-03-09 ENCOUNTER — Encounter (HOSPITAL_COMMUNITY): Payer: Self-pay | Admitting: Emergency Medicine

## 2014-03-09 ENCOUNTER — Emergency Department (HOSPITAL_COMMUNITY): Payer: Medicaid Other

## 2014-03-09 DIAGNOSIS — Z8742 Personal history of other diseases of the female genital tract: Secondary | ICD-10-CM | POA: Insufficient documentation

## 2014-03-09 DIAGNOSIS — K219 Gastro-esophageal reflux disease without esophagitis: Secondary | ICD-10-CM | POA: Insufficient documentation

## 2014-03-09 DIAGNOSIS — F17200 Nicotine dependence, unspecified, uncomplicated: Secondary | ICD-10-CM | POA: Insufficient documentation

## 2014-03-09 DIAGNOSIS — Z8739 Personal history of other diseases of the musculoskeletal system and connective tissue: Secondary | ICD-10-CM | POA: Insufficient documentation

## 2014-03-09 DIAGNOSIS — Z87442 Personal history of urinary calculi: Secondary | ICD-10-CM | POA: Insufficient documentation

## 2014-03-09 DIAGNOSIS — Z79899 Other long term (current) drug therapy: Secondary | ICD-10-CM | POA: Insufficient documentation

## 2014-03-09 DIAGNOSIS — F411 Generalized anxiety disorder: Secondary | ICD-10-CM | POA: Insufficient documentation

## 2014-03-09 DIAGNOSIS — G8929 Other chronic pain: Secondary | ICD-10-CM | POA: Insufficient documentation

## 2014-03-09 DIAGNOSIS — Z880 Allergy status to penicillin: Secondary | ICD-10-CM | POA: Insufficient documentation

## 2014-03-09 DIAGNOSIS — N23 Unspecified renal colic: Secondary | ICD-10-CM

## 2014-03-09 DIAGNOSIS — Z8669 Personal history of other diseases of the nervous system and sense organs: Secondary | ICD-10-CM | POA: Insufficient documentation

## 2014-03-09 DIAGNOSIS — R112 Nausea with vomiting, unspecified: Secondary | ICD-10-CM | POA: Insufficient documentation

## 2014-03-09 DIAGNOSIS — G40909 Epilepsy, unspecified, not intractable, without status epilepticus: Secondary | ICD-10-CM | POA: Insufficient documentation

## 2014-03-09 MED ORDER — ONDANSETRON HCL 4 MG/2ML IJ SOLN
4.0000 mg | Freq: Once | INTRAMUSCULAR | Status: AC
  Administered 2014-03-10: 4 mg via INTRAVENOUS
  Filled 2014-03-09: qty 2

## 2014-03-09 MED ORDER — SODIUM CHLORIDE 0.9 % IV SOLN
1000.0000 mL | INTRAVENOUS | Status: DC
  Administered 2014-03-10: 1000 mL via INTRAVENOUS

## 2014-03-09 MED ORDER — SODIUM CHLORIDE 0.9 % IV SOLN
1000.0000 mL | Freq: Once | INTRAVENOUS | Status: AC
  Administered 2014-03-10: 1000 mL via INTRAVENOUS

## 2014-03-09 MED ORDER — HYDROMORPHONE HCL PF 1 MG/ML IJ SOLN
1.0000 mg | Freq: Once | INTRAMUSCULAR | Status: AC
  Administered 2014-03-10: 1 mg via INTRAVENOUS
  Filled 2014-03-09: qty 1

## 2014-03-09 NOTE — ED Provider Notes (Signed)
CSN: [id number]     Arrival date & time 03/09/14  2035 History  This chart was scribed for Dione Boozeavid Jhonathan Desroches, MD by Ardelia Memsylan Malpass, ED Scribe. This patient was seen in room APA09/APA09 and the patient's care was started at 11:42 PM.   Chief Complaint  Patient presents with  . Flank Pain    The history is provided by the patient. No language interpreter was used.    HPI Comments: Isabella Buchanan is a 33 y.o. female with a history of kidney stone and interstitial cystitis who presents to the Emergency Department complaining of intermittent, "sharp, 9/10" left flank pain onset about 4 hours ago. She states that her pain radiates to her left lower abdomen. She reports associated nausea with episodes of emesis tonight. She believes that she is having a kidney stone, and states that her pain is similar to that which she has had with prior kidney stones. She states that she has been taking her prescribed 10 mg Hydrocodone tonight, which she has been prescribed for years. She states that this has not offered relief. She also states that she has some Toradol from a previous prescription which she has taken with some relief. Pt was seen in the ED 2 weeks ago with a similar complaint, and was found to have a kidney stone, which she states passed.  PCP- Dr. Elias Elseobert Reade   Past Medical History  Diagnosis Date  . Anxiety   . Fibromyalgia   . Restless leg   . Chronic back pain   . GERD (gastroesophageal reflux disease)   . Headache(784.0)     mygrains  . PONV (postoperative nausea and vomiting)   . Interstitial cystitis   . Polycystic ovarian syndrome   . Hiatal hernia   . Seizures   . IBS (irritable bowel syndrome)   . Chronic pain   . Chronic flank pain   . Kidney stones    Past Surgical History  Procedure Laterality Date  . Cholecystectomy    . Tonsillectomy    . Tubal ligation    . Tympanoplasty    . Esophagogastroduodenoscopy endoscopy     Family History  Problem Relation Age of Onset  .  Hypertension Father   . Diabetes Father   . Cancer Other   . Diabetes Other   . Seizures Father    History  Substance Use Topics  . Smoking status: Current Every Day Smoker -- 1.00 packs/day for 15 years    Types: Cigarettes  . Smokeless tobacco: Never Used  . Alcohol Use: No   OB History   Grav Para Term Preterm Abortions TAB SAB Ect Mult Living   2 2  2      2      Review of Systems  Gastrointestinal: Positive for nausea and vomiting.  Genitourinary: Positive for flank pain (left).  All other systems reviewed and are negative.   Allergies  Omnicef; Codeine; Sulfa antibiotics; Amoxicillin; and Penicillins  Home Medications   Prior to Admission medications   Medication Sig Start Date End Date Taking? Authorizing Provider  carisoprodol (SOMA) 350 MG tablet Take 350 mg by mouth daily.     Historical Provider, MD  dexlansoprazole (DEXILANT) 60 MG capsule Take 60 mg by mouth daily as needed (for acid reflux).     Historical Provider, MD  diphenhydrAMINE (BENADRYL) 25 MG tablet Take 50 mg by mouth every 4 (four) hours as needed for allergies.     Historical Provider, MD  escitalopram (LEXAPRO) 10 MG tablet  Take 10 mg by mouth daily.    Historical Provider, MD  HYDROcodone-acetaminophen (NORCO) 10-325 MG per tablet Take 1 tablet by mouth every 6 (six) hours as needed for moderate pain or severe pain.     Historical Provider, MD  ibuprofen (ADVIL,MOTRIN) 200 MG tablet Take 800 mg by mouth every 6 (six) hours as needed for pain.     Historical Provider, MD  ketorolac (TORADOL) 10 MG tablet Take 1 tablet (10 mg total) by mouth every 6 (six) hours as needed. 02/23/14   Vida Roller, MD  Lacosamide (VIMPAT) 150 MG TABS Take 150 mg by mouth 2 (two) times daily.    Historical Provider, MD  LORazepam (ATIVAN) 1 MG tablet Take 1 mg by mouth 3 (three) times daily as needed for anxiety.    Historical Provider, MD  ondansetron (ZOFRAN) 8 MG tablet Take 8 mg by mouth daily as needed for nausea or  vomiting.    Historical Provider, MD  pramipexole (MIRAPEX) 1 MG tablet Take 1 mg by mouth at bedtime.    Historical Provider, MD  pregabalin (LYRICA) 75 MG capsule Take 75 mg by mouth 2 (two) times daily as needed (for excessive pain).     Historical Provider, MD  tamsulosin (FLOMAX) 0.4 MG CAPS capsule Take 1 capsule (0.4 mg total) by mouth daily after supper. 02/22/14   Joya Gaskins, MD   Triage Vitals: BP 155/95  Pulse 113  Temp(Src) 98.8 F (37.1 C) (Oral)  Resp 20  Ht 5\' 6"  (1.676 m)  Wt 190 lb (86.183 kg)  BMI 30.68 kg/m2  SpO2 98%  LMP 03/06/2014  Physical Exam  Nursing note and vitals reviewed. Constitutional: She is oriented to person, place, and time. She appears well-developed and well-nourished. No distress.  Appears uncomfortable  HENT:  Head: Normocephalic and atraumatic.  Eyes: EOM are normal. Pupils are equal, round, and reactive to light.  Neck: Neck supple. No JVD present. No tracheal deviation present.  Cardiovascular: Normal rate, regular rhythm and normal heart sounds.   No murmur heard. Pulmonary/Chest: Effort normal. No respiratory distress. She has no wheezes. She has no rales.  Abdominal: Soft. She exhibits no mass. There is tenderness. There is no rebound and no guarding.  Moderate Left CVA tenderness. Mild LLQ tenderness. No rebound or guarding. Bowel sounds decreased.   Musculoskeletal: Normal range of motion. She exhibits no edema.  Lymphadenopathy:    She has no cervical adenopathy.  Neurological: She is alert and oriented to person, place, and time. No cranial nerve deficit. Coordination normal.  Skin: Skin is warm and dry. No rash noted.  Psychiatric: She has a normal mood and affect. Her behavior is normal.    ED Course  Procedures (including critical care time)  DIAGNOSTIC STUDIES: Oxygen Saturation is 98% on RA, normal by my interpretation.    COORDINATION OF CARE: 11:46 PM- Discussed plan to order medications. Will also obtain  diagnostic lab work and imaging of pt's abdomen. Pt advised of plan for treatment and pt agrees.  Medications  HYDROmorphone (DILAUDID) injection 1 mg (not administered)  0.9 %  sodium chloride infusion (1,000 mLs Intravenous New Bag/Given 03/10/14 0007)    Followed by  0.9 %  sodium chloride infusion (1,000 mLs Intravenous New Bag/Given 03/10/14 0007)  ondansetron (ZOFRAN) injection 4 mg (4 mg Intravenous Given 03/10/14 0009)   Labs Review Results for orders placed during the hospital encounter of 03/09/14  CBC WITH DIFFERENTIAL      Result Value Ref  Range   WBC 12.3 (*) 4.0 - 10.5 K/uL   RBC 5.05  3.87 - 5.11 MIL/uL   Hemoglobin 15.0  12.0 - 15.0 g/dL   HCT 62.144.3  30.836.0 - 65.746.0 %   MCV 87.7  78.0 - 100.0 fL   MCH 29.7  26.0 - 34.0 pg   MCHC 33.9  30.0 - 36.0 g/dL   RDW 84.614.9  96.211.5 - 95.215.5 %   Platelets 338  150 - 400 K/uL   Neutrophils Relative % 55  43 - 77 %   Neutro Abs 6.8  1.7 - 7.7 K/uL   Lymphocytes Relative 33  12 - 46 %   Lymphs Abs 4.0  0.7 - 4.0 K/uL   Monocytes Relative 7  3 - 12 %   Monocytes Absolute 0.9  0.1 - 1.0 K/uL   Eosinophils Relative 4  0 - 5 %   Eosinophils Absolute 0.5  0.0 - 0.7 K/uL   Basophils Relative 1  0 - 1 %   Basophils Absolute 0.1  0.0 - 0.1 K/uL  BASIC METABOLIC PANEL      Result Value Ref Range   Sodium 141  137 - 147 mEq/L   Potassium 3.4 (*) 3.7 - 5.3 mEq/L   Chloride 101  96 - 112 mEq/L   CO2 25  19 - 32 mEq/L   Glucose, Bld 105 (*) 70 - 99 mg/dL   BUN 15  6 - 23 mg/dL   Creatinine, Ser 8.410.86  0.50 - 1.10 mg/dL   Calcium 9.0  8.4 - 32.410.5 mg/dL   GFR calc non Af Amer 88 (*) >90 mL/min   GFR calc Af Amer >90  >90 mL/min  URINALYSIS, ROUTINE W REFLEX MICROSCOPIC      Result Value Ref Range   Color, Urine AMBER (*) YELLOW   APPearance HAZY (*) CLEAR   Specific Gravity, Urine >1.030 (*) 1.005 - 1.030   pH 5.5  5.0 - 8.0   Glucose, UA NEGATIVE  NEGATIVE mg/dL   Hgb urine dipstick LARGE (*) NEGATIVE   Bilirubin Urine NEGATIVE  NEGATIVE    Ketones, ur TRACE (*) NEGATIVE mg/dL   Protein, ur TRACE (*) NEGATIVE mg/dL   Urobilinogen, UA 0.2  0.0 - 1.0 mg/dL   Nitrite NEGATIVE  NEGATIVE   Leukocytes, UA NEGATIVE  NEGATIVE  URINE MICROSCOPIC-ADD ON      Result Value Ref Range   Squamous Epithelial / LPF MANY (*) RARE   WBC, UA 0-2  <3 WBC/hpf   RBC / HPF 7-10  <3 RBC/hpf   Bacteria, UA MANY (*) RARE   Imaging Review Dg Abd 1 View  03/10/2014   CLINICAL DATA:  FLANK PAIN  EXAM: ABDOMEN - 1 VIEW  COMPARISON:  CT ABD/PELV WO CM dated 02/22/2014  FINDINGS: Bowel gas pattern is nondilated and nonobstructive. Moderate amount of retained large bowel stool. Patient's known bilateral nephrolithiasis difficult to appreciate by plain radiograph, however 2 punctate calculi project in the upper pole left kidney and subcentimeter calculus projecting in lower pole of the left kidney. No intra-abdominal mass effect.  IMPRESSION: Moderate amount of retained large bowel stool with non obstructive bowel gas pattern.  Patient's known bilateral nephrolithiasis difficult to appreciate by radiography, however there is re- demonstration of left nephrolithiasis.   Electronically Signed   By: Awilda Metroourtnay  Bloomer   On: 03/10/2014 01:43   Images viewed by me.  MDM   Final diagnoses:  Ureteral colic    Pain consistent with a  ureteral colic. Old records are reviewed and she was seen on April 5 at which time CT showed a 4 mm calculus in the left mid ureter with hydronephrosis. I have reviewed the topogram with a CT scan and calculus is visible, so KUB should be adequate to evaluate position of the calculus. She was given IV fluids, IV ondansetron, and IV hydromorphone. Ketorolac will be withheld should she prove to be a candidate for lithotripsy.  She got good relief with the above-noted treatment although she did require a second dose of hydromorphone. I am KUB, and I can no longer identify the calculus which was in the left mid ureter. This may have passed into  the bladder or just into the lower ureter where overlapping shadows keep me from seeing it clearly. She is discharged with prescription for hydromorphone for pain and is referred back to her urologist.   I personally performed the services described in this documentation, which was scribed in my presence. The recorded information has been reviewed and is accurate.    Dione Booze, MD 03/10/14 [phone]

## 2014-03-09 NOTE — ED Notes (Signed)
Lt flank pain, says she has been dx with kidney stone. Has not seen urologist since dx.   N/V

## 2014-03-10 LAB — CBC WITH DIFFERENTIAL/PLATELET
BASOS ABS: 0.1 10*3/uL (ref 0.0–0.1)
BASOS PCT: 1 % (ref 0–1)
Eosinophils Absolute: 0.5 10*3/uL (ref 0.0–0.7)
Eosinophils Relative: 4 % (ref 0–5)
HCT: 44.3 % (ref 36.0–46.0)
Hemoglobin: 15 g/dL (ref 12.0–15.0)
Lymphocytes Relative: 33 % (ref 12–46)
Lymphs Abs: 4 10*3/uL (ref 0.7–4.0)
MCH: 29.7 pg (ref 26.0–34.0)
MCHC: 33.9 g/dL (ref 30.0–36.0)
MCV: 87.7 fL (ref 78.0–100.0)
MONO ABS: 0.9 10*3/uL (ref 0.1–1.0)
Monocytes Relative: 7 % (ref 3–12)
NEUTROS ABS: 6.8 10*3/uL (ref 1.7–7.7)
Neutrophils Relative %: 55 % (ref 43–77)
PLATELETS: 338 10*3/uL (ref 150–400)
RBC: 5.05 MIL/uL (ref 3.87–5.11)
RDW: 14.9 % (ref 11.5–15.5)
WBC: 12.3 10*3/uL — ABNORMAL HIGH (ref 4.0–10.5)

## 2014-03-10 LAB — BASIC METABOLIC PANEL
BUN: 15 mg/dL (ref 6–23)
CHLORIDE: 101 meq/L (ref 96–112)
CO2: 25 mEq/L (ref 19–32)
CREATININE: 0.86 mg/dL (ref 0.50–1.10)
Calcium: 9 mg/dL (ref 8.4–10.5)
GFR calc non Af Amer: 88 mL/min — ABNORMAL LOW (ref >90)
Glucose, Bld: 105 mg/dL — ABNORMAL HIGH (ref 70–99)
Potassium: 3.4 mEq/L — ABNORMAL LOW (ref 3.7–5.3)
Sodium: 141 mEq/L (ref 137–147)

## 2014-03-10 LAB — URINALYSIS, ROUTINE W REFLEX MICROSCOPIC
Bilirubin Urine: NEGATIVE
Glucose, UA: NEGATIVE mg/dL
Leukocytes, UA: NEGATIVE
Nitrite: NEGATIVE
Specific Gravity, Urine: >1.03 — ABNORMAL HIGH (ref 1.005–1.030)
Urobilinogen, UA: 0.2 mg/dL (ref 0.0–1.0)
pH: 5.5 (ref 5.0–8.0)

## 2014-03-10 LAB — URINE MICROSCOPIC-ADD ON

## 2014-03-10 MED ORDER — HYDROMORPHONE HCL PF 1 MG/ML IJ SOLN
1.0000 mg | Freq: Once | INTRAMUSCULAR | Status: AC
  Administered 2014-03-10: 1 mg via INTRAVENOUS
  Filled 2014-03-10: qty 1

## 2014-03-10 MED ORDER — HYDROMORPHONE HCL 2 MG PO TABS: 2.0000 mg | ORAL_TABLET | ORAL | Status: DC | PRN

## 2014-03-10 NOTE — ED Notes (Signed)
Pt alert & oriented x4, stable gait. Patient given discharge instructions, paperwork & prescription(s). Patient  instructed to stop at the registration desk to finish any additional paperwork. Patient verbalized understanding. Pt left department w/ no further questions. 

## 2014-03-10 NOTE — Discharge Instructions (Signed)
Kidney Stones Kidney stones (urolithiasis) are deposits that form inside your kidneys. The intense pain is caused by the stone moving through the urinary tract. When the stone moves, the ureter goes into spasm around the stone. The stone is usually passed in the urine.  CAUSES   A disorder that makes certain neck glands produce too much parathyroid hormone (primary hyperparathyroidism).  A buildup of uric acid crystals, similar to gout in your joints.  Narrowing (stricture) of the ureter.  A kidney obstruction present at birth (congenital obstruction).  Previous surgery on the kidney or ureters.  Numerous kidney infections. SYMPTOMS   Feeling sick to your stomach (nauseous).  Throwing up (vomiting).  Blood in the urine (hematuria).  Pain that usually spreads (radiates) to the groin.  Frequency or urgency of urination. DIAGNOSIS   Taking a history and physical exam.  Blood or urine tests.  CT scan.  Occasionally, an examination of the inside of the urinary bladder (cystoscopy) is performed. TREATMENT   Observation.  Increasing your fluid intake.  Extracorporeal shock wave lithotripsy This is a noninvasive procedure that uses shock waves to break up kidney stones.  Surgery may be needed if you have severe pain or persistent obstruction. There are various surgical procedures. Most of the procedures are performed with the use of small instruments. Only small incisions are needed to accommodate these instruments, so recovery time is minimized. The size, location, and chemical composition are all important variables that will determine the proper choice of action for you. Talk to your health care provider to better understand your situation so that you will minimize the risk of injury to yourself and your kidney.  HOME CARE INSTRUCTIONS   Drink enough water and fluids to keep your urine clear or pale yellow. This will help you to pass the stone or stone fragments.  Strain  all urine through the provided strainer. Keep all particulate matter and stones for your health care provider to see. The stone causing the pain may be as small as a grain of salt. It is very important to use the strainer each and every time you pass your urine. The collection of your stone will allow your health care provider to analyze it and verify that a stone has actually passed. The stone analysis will often identify what you can do to reduce the incidence of recurrences.  Only take over-the-counter or prescription medicines for pain, discomfort, or fever as directed by your health care provider.  Make a follow-up appointment with your health care provider as directed.  Get follow-up X-rays if required. The absence of pain does not always mean that the stone has passed. It may have only stopped moving. If the urine remains completely obstructed, it can cause loss of kidney function or even complete destruction of the kidney. It is your responsibility to make sure X-rays and follow-ups are completed. Ultrasounds of the kidney can show blockages and the status of the kidney. Ultrasounds are not associated with any radiation and can be performed easily in a matter of minutes. SEEK MEDICAL CARE IF:  You experience pain that is progressive and unresponsive to any pain medicine you have been prescribed. SEEK IMMEDIATE MEDICAL CARE IF:   Pain cannot be controlled with the prescribed medicine.  You have a fever or shaking chills.  The severity or intensity of pain increases over 18 hours and is not relieved by pain medicine.  You develop a new onset of abdominal pain.  You feel faint or pass  out.  You are unable to urinate. MAKE SURE YOU:   Understand these instructions.  Will watch your condition.  Will get help right away if you are not doing well or get worse. Document Released: 11/06/2005 Document Revised: 07/09/2013 Document Reviewed: 04/09/2013 [facility] Patient Information 2014  [employer], [place].  Hydromorphone tablets What is this medicine? HYDROMORPHONE (hye droe MOR fone) is a pain reliever. It is used to treat moderate to severe pain. This medicine may be used for other purposes; ask your health care provider or pharmacist if you have questions. COMMON BRAND NAME(S): Dilaudid What should I tell my health care provider before I take this medicine? They need to know if you have any of these conditions: -brain tumor -drug abuse or addiction -head injury -heart disease -frequently drink alcohol containing drinks -kidney disease or problems going to the bathroom -liver disease -lung disease, asthma, or breathing problems -mental problems -an allergic or unusual reaction to lactose, hydromorphone, other opioid analgesics, other medicines, sulfites, foods, dyes, or preservatives -pregnant or trying to get pregnant -breast-feeding How should I use this medicine? Take this medicine by mouth with a glass of water. If the medicine upsets your stomach, take it with food or milk. Follow the directions on the prescription label. Do not take more medicine than you are told to take. Talk to your pediatrician regarding the use of this medicine in children. Special care may be needed. Overdosage: If you think you have taken too much of this medicine contact a poison control center or emergency room at once. NOTE: This medicine is only for you. Do not share this medicine with others. What if I miss a dose? If you miss a dose, take it as soon as you can. If it is almost time for your next dose, take only that dose. Do not take double or extra doses. What may interact with this medicine? -alcohol -antihistamines for allergy, cough and cold -medicines for anesthesia -medicines for depression, anxiety, or psychotic disturbances -medicines for sleep -muscle relaxants -naltrexone -narcotic medicines (opiates) for pain -phenothiazines like chlorpromazine, mesoridazine,  prochlorperazine, thioridazine -tramadol This list may not describe all possible interactions. Give your health care provider a list of all the medicines, herbs, non-prescription drugs, or dietary supplements you use. Also tell them if you smoke, drink alcohol, or use illegal drugs. Some items may interact with your medicine. What should I watch for while using this medicine? Tell your doctor or health care professional if your pain does not go away, if it gets worse, or if you have new or a different type of pain. You may develop tolerance to the medicine. Tolerance means that you will need a higher dose of the medicine for pain relief. Tolerance is normal and is expected if you take this medicine for a long time. Do not suddenly stop taking your medicine because you may develop a severe reaction. Your body becomes used to the medicine. This does NOT mean you are addicted. Addiction is a behavior related to getting and using a drug for a non-medical reason. If you have pain, you have a medical reason to take pain medicine. Your doctor will tell you how much medicine to take. If your doctor wants you to stop the medicine, the dose will be slowly lowered over time to avoid any side effects. You may get drowsy or dizzy. Do not drive, use machinery, or do anything that needs mental alertness until you know how this medicine affects you. Do not stand or sit  up quickly, especially if you are an older patient. This reduces the risk of dizzy or fainting spells. Alcohol may interfere with the effect of this medicine. Avoid alcoholic drinks. There are different types of narcotic medicines (opiates) for pain. If you take more than one type at the same time, you may have more side effects. Give your health care provider a list of all medicines you use. Your doctor will tell you how much medicine to take. Do not take more medicine than directed. Call emergency for help if you have problems breathing. This medicine will  cause constipation. Try to have a bowel movement at least every 2 to 3 days. If you do not have a bowel movement for 3 days, call your doctor or health care professional. Your mouth may get dry. Chewing sugarless gum or sucking hard candy, and drinking plenty of water may help. Contact your doctor if the problem does not go away or is severe. What side effects may I notice from receiving this medicine? Side effects that you should report to your doctor or health care professional as soon as possible: -allergic reactions like skin rash, itching or hives, swelling of the face, lips, or tongue -breathing problems -changes in vision -confusion -feeling faint or lightheaded, falls -seizures -slow or fast heartbeat -trouble passing urine or change in the amount of urine -trouble with balance, talking, walking -unusually weak or tired  Side effects that usually do not require medical attention (report to your doctor or health care professional if they continue or are bothersome): -difficulty sleeping -drowsiness -dry mouth -flushing -headache -itching -loss of appetite -nausea, vomiting This list may not describe all possible side effects. Call your doctor for medical advice about side effects. You may report side effects to FDA at 1-800-FDA-1088. Where should I keep my medicine? Keep out of the reach of children. This medicine can be abused. Keep your medicine in a safe place to protect it from theft. Do not share this medicine with anyone. Selling or giving away this medicine is dangerous and against the law. Store at room temperature between 15 and 30 degrees C (59 and 86 degrees F). Keep container tightly closed. Protect from light. This medicine may cause accidental overdose and death if it is taken by other adults, children, or pets. Flush any unused medicine down the toilet to reduce the chance of harm. Do not use the medicine after the expiration date. NOTE: This sheet is a summary. It  may not cover all possible information. If you have questions about this medicine, talk to your doctor, pharmacist, or health care provider.  2014, [employer]. (2013-06-10 09:50:15)

## 2014-05-17 ENCOUNTER — Emergency Department (HOSPITAL_COMMUNITY)
Admission: EM | Admit: 2014-05-17 | Discharge: 2014-05-18 | Disposition: A | Discharge status: Home / Self Care | Payer: Medicaid Other | Attending: Emergency Medicine | Admitting: Emergency Medicine

## 2014-05-17 ENCOUNTER — Encounter (HOSPITAL_COMMUNITY): Payer: Self-pay | Admitting: Emergency Medicine

## 2014-05-17 DIAGNOSIS — Z9889 Other specified postprocedural states: Secondary | ICD-10-CM | POA: Insufficient documentation

## 2014-05-17 DIAGNOSIS — F411 Generalized anxiety disorder: Secondary | ICD-10-CM | POA: Insufficient documentation

## 2014-05-17 DIAGNOSIS — Z862 Personal history of diseases of the blood and blood-forming organs and certain disorders involving the immune mechanism: Secondary | ICD-10-CM | POA: Insufficient documentation

## 2014-05-17 DIAGNOSIS — Z8719 Personal history of other diseases of the digestive system: Secondary | ICD-10-CM | POA: Insufficient documentation

## 2014-05-17 DIAGNOSIS — M546 Pain in thoracic spine: Secondary | ICD-10-CM | POA: Insufficient documentation

## 2014-05-17 DIAGNOSIS — Z880 Allergy status to penicillin: Secondary | ICD-10-CM | POA: Insufficient documentation

## 2014-05-17 DIAGNOSIS — R110 Nausea: Secondary | ICD-10-CM | POA: Insufficient documentation

## 2014-05-17 DIAGNOSIS — Z8742 Personal history of other diseases of the female genital tract: Secondary | ICD-10-CM | POA: Insufficient documentation

## 2014-05-17 DIAGNOSIS — Z79899 Other long term (current) drug therapy: Secondary | ICD-10-CM | POA: Insufficient documentation

## 2014-05-17 DIAGNOSIS — R300 Dysuria: Secondary | ICD-10-CM | POA: Insufficient documentation

## 2014-05-17 DIAGNOSIS — Z9851 Tubal ligation status: Secondary | ICD-10-CM | POA: Insufficient documentation

## 2014-05-17 DIAGNOSIS — F17200 Nicotine dependence, unspecified, uncomplicated: Secondary | ICD-10-CM | POA: Insufficient documentation

## 2014-05-17 DIAGNOSIS — Z9089 Acquired absence of other organs: Secondary | ICD-10-CM | POA: Insufficient documentation

## 2014-05-17 DIAGNOSIS — Z3202 Encounter for pregnancy test, result negative: Secondary | ICD-10-CM | POA: Insufficient documentation

## 2014-05-17 DIAGNOSIS — Z8639 Personal history of other endocrine, nutritional and metabolic disease: Secondary | ICD-10-CM | POA: Insufficient documentation

## 2014-05-17 DIAGNOSIS — G40909 Epilepsy, unspecified, not intractable, without status epilepticus: Secondary | ICD-10-CM | POA: Insufficient documentation

## 2014-05-17 DIAGNOSIS — Z87442 Personal history of urinary calculi: Secondary | ICD-10-CM | POA: Insufficient documentation

## 2014-05-17 DIAGNOSIS — R109 Unspecified abdominal pain: Secondary | ICD-10-CM | POA: Insufficient documentation

## 2014-05-17 DIAGNOSIS — G8929 Other chronic pain: Secondary | ICD-10-CM | POA: Insufficient documentation

## 2014-05-17 LAB — URINALYSIS, ROUTINE W REFLEX MICROSCOPIC
BILIRUBIN URINE: NEGATIVE
GLUCOSE, UA: NEGATIVE mg/dL
Hgb urine dipstick: NEGATIVE
Ketones, ur: NEGATIVE mg/dL
Leukocytes, UA: NEGATIVE
Nitrite: NEGATIVE
Protein, ur: NEGATIVE mg/dL
Specific Gravity, Urine: 1.02 (ref 1.005–1.030)
Urobilinogen, UA: 0.2 mg/dL (ref 0.0–1.0)
pH: 6 (ref 5.0–8.0)

## 2014-05-17 LAB — POC URINE PREG, ED: Preg Test, Ur: NEGATIVE

## 2014-05-17 NOTE — ED Provider Notes (Signed)
CSN: [id number]     Arrival date & time 05/17/14  2144 History   First MD Initiated Contact with Patient 05/17/14 2230     Chief Complaint  Patient presents with  . Nephrolithiasis     (Consider location/radiation/quality/duration/timing/severity/associated sxs/prior Treatment) Patient is a 33 y.o. female presenting with flank pain. The history is provided by the patient.  Flank Pain This is a new problem. The current episode started today. The problem occurs constantly. The problem has been unchanged. Associated symptoms include nausea. Pertinent negatives include no abdominal pain. Nothing aggravates the symptoms. Treatments tried: narcotics. The treatment provided mild relief.   Isabella Buchanan is a 33 y.o. female who presents to the ED with left flank pian that started about an hour prior to arrival to the ED. She has a history of kidney stones and this feels similar.  Past Medical History  Diagnosis Date  . Anxiety   . Fibromyalgia   . Restless leg   . Chronic back pain   . GERD (gastroesophageal reflux disease)   . Headache(784.0)     mygrains  . PONV (postoperative nausea and vomiting)   . Interstitial cystitis   . Polycystic ovarian syndrome   . Hiatal hernia   . Seizures   . IBS (irritable bowel syndrome)   . Chronic pain   . Chronic flank pain   . Kidney stones    Past Surgical History  Procedure Laterality Date  . Cholecystectomy    . Tonsillectomy    . Tubal ligation    . Tympanoplasty    . Esophagogastroduodenoscopy endoscopy     Family History  Problem Relation Age of Onset  . Hypertension Father   . Diabetes Father   . Cancer Other   . Diabetes Other   . Seizures Father    History  Substance Use Topics  . Smoking status: Current Every Day Smoker -- 1.00 packs/day for 15 years    Types: Cigarettes  . Smokeless tobacco: Never Used  . Alcohol Use: No   OB History   Grav Para Term Preterm Abortions TAB SAB Ect Mult Living   2 2  2      2       Review of Systems  Gastrointestinal: Positive for nausea. Negative for abdominal pain.  Genitourinary: Positive for dysuria (that is chronic due to interstitial cystitis) and flank pain.  all other systems negative.    Allergies  Omnicef; Codeine; Sulfa antibiotics; Amoxicillin; and Penicillins  Home Medications   Prior to Admission medications   Medication Sig Start Date End Date Taking? Authorizing Provider  carisoprodol (SOMA) 350 MG tablet Take 350 mg by mouth daily.     Historical Provider, MD  dexlansoprazole (DEXILANT) 60 MG capsule Take 60 mg by mouth daily as needed (for acid reflux).     Historical Provider, MD  diphenhydrAMINE (BENADRYL) 25 MG tablet Take 50 mg by mouth every 4 (four) hours as needed for allergies.     Historical Provider, MD  escitalopram (LEXAPRO) 10 MG tablet Take 10 mg by mouth daily.    Historical Provider, MD  HYDROcodone-acetaminophen (NORCO) 10-325 MG per tablet Take 1 tablet by mouth every 6 (six) hours as needed for moderate pain or severe pain.     Historical Provider, MD  HYDROmorphone (DILAUDID) 2 MG tablet Take 1 tablet (2 mg total) by mouth every 4 (four) hours as needed for severe pain. 03/10/14   Isabella Boozeavid Glick, MD  ibuprofen (ADVIL,MOTRIN) 200 MG tablet Take 800 mg  by mouth every 6 (six) hours as needed for pain.     Historical Provider, MD  ketorolac (TORADOL) 10 MG tablet Take 1 tablet (10 mg total) by mouth every 6 (six) hours as needed. 02/23/14   Isabella RollerBrian D Miller, MD  Lacosamide (VIMPAT) 150 MG TABS Take 150 mg by mouth 2 (two) times daily.    Historical Provider, MD  LORazepam (ATIVAN) 1 MG tablet Take 1 mg by mouth 3 (three) times daily as needed for anxiety.    Historical Provider, MD  ondansetron (ZOFRAN) 8 MG tablet Take 8 mg by mouth daily as needed for nausea or vomiting.    Historical Provider, MD  pramipexole (MIRAPEX) 1 MG tablet Take 1 mg by mouth at bedtime.    Historical Provider, MD  pregabalin (LYRICA) 75 MG capsule Take 75 mg by  mouth 2 (two) times daily as needed (for excessive pain).     Historical Provider, MD  tamsulosin (FLOMAX) 0.4 MG CAPS capsule Take 1 capsule (0.4 mg total) by mouth daily after supper. 02/22/14   Isabella Gaskinsonald W Wickline, MD   BP 145/95  Pulse 94  Temp(Src) 98 F (36.7 C) (Oral)  Resp 20  Ht 5\' 5"  (1.651 m)  Wt 195 lb (88.451 kg)  BMI 32.45 kg/m2  SpO2 98%  LMP 05/06/2014 Physical Exam  Nursing note and vitals reviewed. Constitutional: She is oriented to person, place, and time. She appears well-developed and well-nourished.  Patient sleeping in exam room. Woke patient to interview her. Speech slightly slurred due to pain medication.   HENT:  Head: Normocephalic.  Eyes: EOM are normal.  Neck: Neck supple.  Cardiovascular: Normal rate and regular rhythm.   Pulmonary/Chest: Effort normal and breath sounds normal.  Abdominal: Soft. Bowel sounds are normal. There is no tenderness.  Musculoskeletal:       Thoracic back: She exhibits tenderness and spasm. Decreased range of motion: due to pain.       Back:  Neurological: She is alert and oriented to person, place, and time. No cranial nerve deficit.  Skin: Skin is warm and dry.  Psychiatric: She has a normal mood and affect.    ED Course: Dr. Preston FleetingGlick in to examine the patient and ordered IV fluids, medications and CT of abdomen/pelvis.   Procedures (including critical care time) Labs Review Results for orders placed during the hospital encounter of 05/17/14 (from the past 24 hour(s))  URINALYSIS, ROUTINE W REFLEX MICROSCOPIC     Status: Abnormal   Collection Time    05/17/14 10:23 PM      Result Value Ref Range   Color, Urine YELLOW  YELLOW   APPearance CLOUDY (*) CLEAR   Specific Gravity, Urine 1.020  1.005 - 1.030   pH 6.0  5.0 - 8.0   Glucose, UA NEGATIVE  NEGATIVE mg/dL   Hgb urine dipstick NEGATIVE  NEGATIVE   Bilirubin Urine NEGATIVE  NEGATIVE   Ketones, ur NEGATIVE  NEGATIVE mg/dL   Protein, ur NEGATIVE  NEGATIVE mg/dL    Urobilinogen, UA 0.2  0.0 - 1.0 mg/dL   Nitrite NEGATIVE  NEGATIVE   Leukocytes, UA NEGATIVE  NEGATIVE  POC URINE PREG, ED     Status: None   Collection Time    05/17/14 10:28 PM      Result Value Ref Range   Preg Test, Ur NEGATIVE  NEGATIVE    Ct Abdomen Pelvis Wo Contrast  05/18/2014   CLINICAL DATA:  Left flank pain.  Nephrolithiasis.  EXAM: CT ABDOMEN  AND PELVIS WITHOUT CONTRAST  TECHNIQUE: Multidetector CT imaging of the abdomen and pelvis was performed following the standard protocol without IV contrast.  COMPARISON:  02/22/2014  FINDINGS: BODY WALL: Unremarkable.  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 It may arise from the thoracic duct.  ABDOMEN/PELVIS:  Liver: No focal abnormality.  Biliary: Cholecystectomy.  Pancreas: Unremarkable.  Spleen: Unremarkable.  Adrenals: Unremarkable.  Kidneys and ureters: There are multiple left renal calculi, number likely stable from previous. The largest is in the lower pole measuring 6 mm. No hydronephrosis or ureteral calculus.  Bladder: Unremarkable.  Reproductive: Unremarkable.  Bowel: No obstruction. Normal appendix.  Retroperitoneum: No mass or adenopathy.  Peritoneum: No ascites or pneumoperitoneum.  Vascular: No acute abnormality.  OSSEOUS: No acute abnormalities.  IMPRESSION: 1. No hydronephrosis or ureteral calculus. 2. Left nephrolithiasis.   Electronically Signed   By: Tiburcio Pea M.D.   On: 05/18/2014 02:02    MDM  33 y.o. female with hx of kidney stones and episode tonight similar to usual episodes of kidney stones. Patient feeling better after IV fluids and medications. Stable for discharge without obstruction. I have reviewed this patient's vital signs, nurses notes, appropriate labs and imaging.  I have discussed findings with the patient and plan of care. She voices understanding and agrees to plan.      [address] [place], [place] 05/18/14 [id number]

## 2014-05-17 NOTE — ED Notes (Signed)
Left flank pain, kidney stone symptoms.

## 2014-05-18 ENCOUNTER — Emergency Department (HOSPITAL_COMMUNITY): Payer: Medicaid Other

## 2014-05-18 MED ORDER — KETOROLAC TROMETHAMINE 30 MG/ML IJ SOLN
30.0000 mg | Freq: Once | INTRAMUSCULAR | Status: AC
  Administered 2014-05-18: 30 mg via INTRAVENOUS
  Filled 2014-05-18: qty 1

## 2014-05-18 MED ORDER — SODIUM CHLORIDE 0.9 % IV SOLN
1000.0000 mL | Freq: Once | INTRAVENOUS | Status: AC
  Administered 2014-05-18: 1000 mL via INTRAVENOUS

## 2014-05-18 MED ORDER — MORPHINE SULFATE 4 MG/ML IJ SOLN
4.0000 mg | Freq: Once | INTRAMUSCULAR | Status: AC
  Administered 2014-05-18: 4 mg via INTRAVENOUS
  Filled 2014-05-18: qty 1

## 2014-05-18 MED ORDER — SODIUM CHLORIDE 0.9 % IV SOLN
1000.0000 mL | INTRAVENOUS | Status: DC
  Administered 2014-05-18: 1000 mL via INTRAVENOUS

## 2014-05-18 MED ORDER — ONDANSETRON HCL 4 MG/2ML IJ SOLN
4.0000 mg | Freq: Once | INTRAMUSCULAR | Status: AC
  Administered 2014-05-18: 4 mg via INTRAVENOUS
  Filled 2014-05-18: qty 2

## 2014-05-18 NOTE — ED Provider Notes (Signed)
Medical screening examination/treatment/procedure(s) were performed by non-physician practitioner and as supervising physician I was immediately available for consultation/collaboration.   EKG Interpretation None       Elliott L Wentz, MD 05/18/14 1648 

## 2014-05-18 NOTE — ED Notes (Signed)
Pt states she did not want to leave without pain medication. She came here for more than OTC medication. But would go home anyway.

## 2014-05-18 NOTE — ED Provider Notes (Signed)
33 year old female with a history of kidney stones and interstitial cystitis comes in with left flank pain, nausea, vomiting. She has moderate left CVA tenderness. Urine does not show any blood. Recent CT scan showed several real calculi. She will be sent for CT scan to further evaluate this.  Medical screening examination/treatment/procedure(s) were conducted as a shared visit with non-physician practitioner(s) and myself.  I personally evaluated the patient during the encounter.    [name], MD 05/21/14 [phone]

## 2014-07-12 ENCOUNTER — Emergency Department (HOSPITAL_COMMUNITY)
Admission: EM | Admit: 2014-07-12 | Discharge: 2014-07-12 | Disposition: A | Discharge status: Home / Self Care | Payer: Medicaid Other | Attending: Emergency Medicine | Admitting: Emergency Medicine

## 2014-07-12 ENCOUNTER — Encounter (HOSPITAL_COMMUNITY): Payer: Self-pay | Admitting: Emergency Medicine

## 2014-07-12 DIAGNOSIS — Z79899 Other long term (current) drug therapy: Secondary | ICD-10-CM | POA: Insufficient documentation

## 2014-07-12 DIAGNOSIS — Z791 Long term (current) use of non-steroidal anti-inflammatories (NSAID): Secondary | ICD-10-CM | POA: Insufficient documentation

## 2014-07-12 DIAGNOSIS — G40909 Epilepsy, unspecified, not intractable, without status epilepticus: Secondary | ICD-10-CM | POA: Diagnosis not present

## 2014-07-12 DIAGNOSIS — Z8639 Personal history of other endocrine, nutritional and metabolic disease: Secondary | ICD-10-CM | POA: Insufficient documentation

## 2014-07-12 DIAGNOSIS — R569 Unspecified convulsions: Secondary | ICD-10-CM | POA: Diagnosis present

## 2014-07-12 DIAGNOSIS — Z87448 Personal history of other diseases of urinary system: Secondary | ICD-10-CM | POA: Diagnosis not present

## 2014-07-12 DIAGNOSIS — G8929 Other chronic pain: Secondary | ICD-10-CM | POA: Insufficient documentation

## 2014-07-12 DIAGNOSIS — Z880 Allergy status to penicillin: Secondary | ICD-10-CM | POA: Insufficient documentation

## 2014-07-12 DIAGNOSIS — F17200 Nicotine dependence, unspecified, uncomplicated: Secondary | ICD-10-CM | POA: Insufficient documentation

## 2014-07-12 DIAGNOSIS — K219 Gastro-esophageal reflux disease without esophagitis: Secondary | ICD-10-CM | POA: Insufficient documentation

## 2014-07-12 DIAGNOSIS — Z87442 Personal history of urinary calculi: Secondary | ICD-10-CM | POA: Insufficient documentation

## 2014-07-12 DIAGNOSIS — Z862 Personal history of diseases of the blood and blood-forming organs and certain disorders involving the immune mechanism: Secondary | ICD-10-CM | POA: Insufficient documentation

## 2014-07-12 DIAGNOSIS — G2581 Restless legs syndrome: Secondary | ICD-10-CM | POA: Diagnosis not present

## 2014-07-12 DIAGNOSIS — F411 Generalized anxiety disorder: Secondary | ICD-10-CM | POA: Diagnosis not present

## 2014-07-12 MED ORDER — KETOROLAC TROMETHAMINE 30 MG/ML IJ SOLN
30.0000 mg | Freq: Once | INTRAMUSCULAR | Status: AC
  Administered 2014-07-12: 30 mg via INTRAVENOUS
  Filled 2014-07-12: qty 1

## 2014-07-12 MED ORDER — LEVETIRACETAM IN NACL 500 MG/100ML IV SOLN
500.0000 mg | Freq: Once | INTRAVENOUS | Status: AC
  Administered 2014-07-12: 500 mg via INTRAVENOUS
  Filled 2014-07-12: qty 100

## 2014-07-12 MED ORDER — LEVETIRACETAM IN NACL 500 MG/100ML IV SOLN
INTRAVENOUS | Status: AC
  Filled 2014-07-12: qty 100

## 2014-07-12 MED ORDER — LEVETIRACETAM 500 MG PO TABS: 500.0000 mg | ORAL_TABLET | Freq: Two times a day (BID) | ORAL | Status: DC

## 2014-07-12 NOTE — ED Notes (Signed)
Witnessed seizure at h ome lasting for approximately 5 minutes.  Has had 2 seizures since July visit to doctor to change seizure meds. Pt states doctor changed vempat at that visit.

## 2014-07-12 NOTE — Discharge Instructions (Signed)
After looking at the dosing of your medication for seizures, there is some room for increasing this does however this should be done under the care of your neurologist. He may also start the new medication called Keppra if they agree that this is a good choice for you. I have given you a prescription for this but did not fill it until you have discussed this with your neurologist in the morning.

## 2014-07-12 NOTE — ED Provider Notes (Signed)
CSN: [id number]     Arrival date & time 07/12/14  1956 History  This chart was scribed for Vida Roller, MD by Julian Hy, ED Scribe. The patient was seen in [facility]. The patient's care was started at 8:11 PM.     Chief Complaint  Patient presents with  . Seizures   The history is provided by the patient and the spouse. No language interpreter was used.   HPI Comments: Isabella Buchanan is a 33 y.o. female who arrived via [facility] to the Emergency Department complaining of recurrent seizures onset immediately prior to arrival. Pt reports she has been diagnosed with seizures for one year. Pt reports she sees Dr. Gerilyn Pilgrim for her neurology needs. 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 Pt reports her last seizure was 2 weeks ago that lasted for 6 minutes. Pt reports she increased her Vimpat from 150 mg BID to 200 mg BID. Pt reports they tried Topamax but failed due to strange visual sequale. Pt reports a decrease in seizures since increasing her Vimpat. Pt reports she typically left frontal lobe seizures. Pt's husband states the pt had to be restrained during her current seizure.. Pt does not remember the seizure. Pt reports she typically has whole body seizures. Pt reports she took two Valium before the onset of her seizure. Pt reports her blood sugar was low. Pt reports some sleep deprivation with approximately 5 hours of sleep last night. Pt's husband reports her hand curl up and eyes twitch when she starts having seizures.   She has had no other c/o including f/c/n/v/d or any pain until after the seizure and now has whole body pain.  Past Medical History  Diagnosis Date  . Anxiety   . Fibromyalgia   . Restless leg   . Chronic back pain   . GERD (gastroesophageal reflux disease)   . Headache(784.0)     mygrains  . PONV (postoperative nausea and vomiting)   . Interstitial  cystitis   . Polycystic ovarian syndrome   . Hiatal hernia   . Seizures   . IBS (irritable bowel syndrome)   . Chronic pain   . Chronic flank pain   . Kidney stones    Past Surgical History  Procedure Laterality Date  . Cholecystectomy    . Tonsillectomy    . Tubal ligation    . Tympanoplasty    . Esophagogastroduodenoscopy endoscopy     Family History  Problem Relation Age of Onset  . Hypertension Father   . Diabetes Father   . Cancer Other   . Diabetes Other   . Seizures Father    History  Substance Use Topics  . Smoking status: Current Every Day Smoker -- 1.00 packs/day for 15 years    Types: Cigarettes  . Smokeless tobacco: Never Used  . Alcohol Use: No   OB History   Grav Para Term Preterm Abortions TAB SAB Ect Mult Living   Review of Systems  Neurological: Positive for seizures.  All other systems reviewed and are negative.     Allergies  Omnicef; Codeine; Sulfa antibiotics; Amoxicillin; and Penicillins  Home Medications   Prior to Admission medications   Medication Sig Start Date End Date Taking? Authorizing Provider  carisoprodol (SOMA) 350 MG tablet Take 350 mg by mouth daily.  Yes Historical Provider, MD  dexlansoprazole (DEXILANT) 60 MG capsule Take 60 mg by mouth daily as needed (for acid reflux).    Yes Historical Provider, MD  diazepam (VALIUM) 5 MG tablet Take 5 mg by mouth every 8 (eight) hours as needed. FOR SEIZURES   Yes Historical Provider, MD  diphenhydrAMINE (BENADRYL) 25 MG tablet Take 50 mg by mouth every 4 (four) hours as needed for allergies.    Yes Historical Provider, MD  HYDROcodone-acetaminophen (NORCO) 10-325 MG per tablet Take 1 tablet by mouth every 6 (six) hours as needed for moderate pain or severe pain.    Yes Historical Provider, MD  lacosamide (VIMPAT) 200 MG TABS tablet Take 200 mg by mouth 2 (two) times daily.   Yes Historical Provider, MD  pramipexole (MIRAPEX) 1 MG tablet Take 1 mg by mouth at  bedtime.   Yes Historical Provider, MD  QUEtiapine (SEROQUEL) 100 MG tablet Take 100-200 mg by mouth at bedtime as needed (for sleep).   Yes Historical Provider, MD  ibuprofen (ADVIL,MOTRIN) 200 MG tablet Take 800 mg by mouth every 6 (six) hours as needed for pain.     Historical Provider, MD  levETIRAcetam (KEPPRA) 500 MG tablet Take 1 tablet (500 mg total) by mouth 2 (two) times daily. 07/12/14   Vida Roller, MD   Triage Vitals: BP 118/73  Pulse 94  Temp(Src) 98.8 F (37.1 C) (Oral)  Resp 20  Ht  (1.676 m)  Wt 198 lb (89.812 kg)  BMI 31.97 kg/m2  SpO2 97%  LMP 06/05/2014 Physical Exam  Nursing note and vitals reviewed. Constitutional: She appears well-developed and well-nourished. No distress.  HENT:  Head: Normocephalic and atraumatic.  Mouth/Throat: Oropharynx is clear and moist. No oropharyngeal exudate.  Eyes: Conjunctivae and EOM are normal. Pupils are equal, round, and reactive to light. Right eye exhibits no discharge. Left eye exhibits no discharge. No scleral icterus.  Neck: Normal range of motion. Neck supple. No JVD present. No thyromegaly present.  Cardiovascular: Normal rate, regular rhythm, normal heart sounds and intact distal pulses.  Exam reveals no gallop and no friction rub.   No murmur heard. Pulmonary/Chest: Effort normal and breath sounds normal. No respiratory distress. She has no wheezes. She has no rales.  Abdominal: Soft. Bowel sounds are normal. She exhibits no distension and no mass. There is no tenderness.  Musculoskeletal: Normal range of motion. She exhibits no edema and no tenderness.  Lymphadenopathy:    She has no cervical adenopathy.  Neurological: She is alert. Coordination normal.  Neurologic exam:  Speech clear, pupils equal round reactive to light, extraocular movements intact  Normal peripheral visual fields Cranial nerves III through XII normal including no facial droop Follows commands, moves all extremities x4, normal strength to  bilateral upper and lower extremities at all major muscle groups including grip Sensation normal to light touch and pinprick Coordination intact, no limb ataxia, finger-nose-finger normal Rapid alternating movements normal No pronator drift Gait normal   Skin: Skin is warm and dry. No rash noted. No erythema.  Psychiatric: She has a normal mood and affect. Her behavior is normal.    ED Course  Procedures (including critical care time) DIAGNOSTIC STUDIES: Oxygen Saturation is 97% on normal, adequate by my interpretation.    COORDINATION OF CARE: 8:19 PM- Patient informed of current plan for treatment and evaluation and agrees with plan at this time.    Labs Review Labs Reviewed - No data to display  Imaging Review No results  found.    MDM   Final diagnoses:  Seizure    Well appaering at this time - add toradol and keppra tonight - can f/u with Neuro in AM by phone - she had  of Valium prior to seizure feeling as though she was going to have one.  No further seizures occurring after patient given Keppra, requesting discharge, appears stable to followup with neurologist tomorrow.  Meds given in ED:  Medications  levETIRAcetam (KEPPRA) IVPB 500 mg/100 mL premix (500 mg Intravenous Given 07/12/14 2046)  ketorolac (TORADOL) 30 MG/ML injection 30 mg (30 mg Intravenous Given 07/12/14 2035)    New Prescriptions   LEVETIRACETAM (KEPPRA) 500 MG TABLET    Take 1 tablet (500 mg total) by mouth 2 (two) times daily.        Vida Roller, MD 07/12/14 2202

## 2014-07-16 ENCOUNTER — Encounter (HOSPITAL_COMMUNITY): Payer: Self-pay | Admitting: Emergency Medicine

## 2014-07-16 ENCOUNTER — Emergency Department (HOSPITAL_COMMUNITY): Payer: Medicaid Other

## 2014-07-16 ENCOUNTER — Inpatient Hospital Stay (HOSPITAL_COMMUNITY)
Admission: EM | Admit: 2014-07-16 | Discharge: 2014-07-17 | DRG: 871 | Disposition: A | Discharge status: Home / Self Care | Payer: Medicaid Other | Attending: Internal Medicine | Admitting: Internal Medicine

## 2014-07-16 DIAGNOSIS — J189 Pneumonia, unspecified organism: Secondary | ICD-10-CM | POA: Diagnosis present

## 2014-07-16 DIAGNOSIS — A419 Sepsis, unspecified organism: Secondary | ICD-10-CM | POA: Diagnosis present

## 2014-07-16 DIAGNOSIS — F17200 Nicotine dependence, unspecified, uncomplicated: Secondary | ICD-10-CM | POA: Diagnosis present

## 2014-07-16 DIAGNOSIS — G9340 Encephalopathy, unspecified: Secondary | ICD-10-CM | POA: Diagnosis present

## 2014-07-16 DIAGNOSIS — J9600 Acute respiratory failure, unspecified whether with hypoxia or hypercapnia: Secondary | ICD-10-CM | POA: Diagnosis present

## 2014-07-16 DIAGNOSIS — G40909 Epilepsy, unspecified, not intractable, without status epilepticus: Secondary | ICD-10-CM | POA: Diagnosis present

## 2014-07-16 DIAGNOSIS — IMO0001 Reserved for inherently not codable concepts without codable children: Secondary | ICD-10-CM | POA: Diagnosis present

## 2014-07-16 DIAGNOSIS — Z79899 Other long term (current) drug therapy: Secondary | ICD-10-CM

## 2014-07-16 DIAGNOSIS — R6520 Severe sepsis without septic shock: Secondary | ICD-10-CM

## 2014-07-16 LAB — COMPREHENSIVE METABOLIC PANEL
ALBUMIN: 3.5 g/dL (ref 3.5–5.2)
ALK PHOS: 97 U/L (ref 39–117)
ALT: 18 U/L (ref 0–35)
AST: 21 U/L (ref 0–37)
Anion gap: 16 — ABNORMAL HIGH (ref 5–15)
BUN: 11 mg/dL (ref 6–23)
CALCIUM: 8.9 mg/dL (ref 8.4–10.5)
CO2: 21 mEq/L (ref 19–32)
Chloride: 98 mEq/L (ref 96–112)
Creatinine, Ser: 0.86 mg/dL (ref 0.50–1.10)
GFR calc Af Amer: >90 mL/min (ref >90)
GFR calc non Af Amer: 88 mL/min — ABNORMAL LOW (ref >90)
Glucose, Bld: 108 mg/dL — ABNORMAL HIGH (ref 70–99)
POTASSIUM: 3.6 meq/L — AB (ref 3.7–5.3)
Sodium: 135 mEq/L — ABNORMAL LOW (ref 137–147)
Total Bilirubin: <0.2 mg/dL — ABNORMAL LOW (ref 0.3–1.2)
Total Protein: 7.5 g/dL (ref 6.0–8.3)

## 2014-07-16 LAB — CBC WITH DIFFERENTIAL/PLATELET
BASOS ABS: 0.1 10*3/uL (ref 0.0–0.1)
Basophils Relative: 1 % (ref 0–1)
EOS ABS: 0.5 10*3/uL (ref 0.0–0.7)
EOS PCT: 4 % (ref 0–5)
HCT: 41 % (ref 36.0–46.0)
Hemoglobin: 13.3 g/dL (ref 12.0–15.0)
LYMPHS PCT: 13 % (ref 12–46)
Lymphs Abs: 1.9 10*3/uL (ref 0.7–4.0)
MCH: 28.7 pg (ref 26.0–34.0)
MCHC: 32.4 g/dL (ref 30.0–36.0)
MCV: 88.6 fL (ref 78.0–100.0)
Monocytes Absolute: 1.4 10*3/uL — ABNORMAL HIGH (ref 0.1–1.0)
Monocytes Relative: 10 % (ref 3–12)
Neutro Abs: 10.6 10*3/uL — ABNORMAL HIGH (ref 1.7–7.7)
Neutrophils Relative %: 72 % (ref 43–77)
Platelets: 310 10*3/uL (ref 150–400)
RBC: 4.63 MIL/uL (ref 3.87–5.11)
RDW: 14.3 % (ref 11.5–15.5)
WBC: 14.5 10*3/uL — AB (ref 4.0–10.5)

## 2014-07-16 LAB — I-STAT CG4 LACTIC ACID, ED: Lactic Acid, Venous: 1.9 mmol/L (ref 0.5–2.2)

## 2014-07-16 MED ORDER — ACETAMINOPHEN 325 MG PO TABS
650.0000 mg | ORAL_TABLET | Freq: Once | ORAL | Status: AC
  Administered 2014-07-16: 650 mg via ORAL

## 2014-07-16 MED ORDER — ACETAMINOPHEN 500 MG PO TABS: 1000.0000 mg | ORAL_TABLET | Freq: Four times a day (QID) | ORAL | Status: DC | PRN

## 2014-07-16 MED ORDER — IBUPROFEN 800 MG PO TABS
800.0000 mg | ORAL_TABLET | Freq: Once | ORAL | Status: AC
  Administered 2014-07-16: 800 mg via ORAL
  Filled 2014-07-16: qty 1

## 2014-07-16 NOTE — ED Notes (Signed)
Pt in c/o cough and congestion with fever since yesterday, pt c/o generalized body aches, no meds PTA

## 2014-07-17 ENCOUNTER — Ambulatory Visit (HOSPITAL_COMMUNITY): Payer: Medicaid Other

## 2014-07-17 ENCOUNTER — Encounter (HOSPITAL_COMMUNITY): Payer: Self-pay | Admitting: *Deleted

## 2014-07-17 DIAGNOSIS — J9600 Acute respiratory failure, unspecified whether with hypoxia or hypercapnia: Secondary | ICD-10-CM | POA: Diagnosis present

## 2014-07-17 DIAGNOSIS — G40909 Epilepsy, unspecified, not intractable, without status epilepticus: Secondary | ICD-10-CM | POA: Diagnosis present

## 2014-07-17 DIAGNOSIS — A419 Sepsis, unspecified organism: Secondary | ICD-10-CM | POA: Diagnosis present

## 2014-07-17 DIAGNOSIS — F17200 Nicotine dependence, unspecified, uncomplicated: Secondary | ICD-10-CM | POA: Diagnosis present

## 2014-07-17 DIAGNOSIS — IMO0001 Reserved for inherently not codable concepts without codable children: Secondary | ICD-10-CM | POA: Diagnosis present

## 2014-07-17 DIAGNOSIS — R6520 Severe sepsis without septic shock: Secondary | ICD-10-CM | POA: Diagnosis present

## 2014-07-17 DIAGNOSIS — J189 Pneumonia, unspecified organism: Secondary | ICD-10-CM | POA: Diagnosis present

## 2014-07-17 DIAGNOSIS — R4182 Altered mental status, unspecified: Secondary | ICD-10-CM | POA: Diagnosis present

## 2014-07-17 DIAGNOSIS — G9340 Encephalopathy, unspecified: Secondary | ICD-10-CM | POA: Diagnosis present

## 2014-07-17 DIAGNOSIS — Z79899 Other long term (current) drug therapy: Secondary | ICD-10-CM | POA: Diagnosis not present

## 2014-07-17 LAB — I-STAT ARTERIAL BLOOD GAS, ED
Acid-base deficit: 1 mmol/L (ref 0.0–2.0)
Bicarbonate: 23.3 mEq/L (ref 20.0–24.0)
O2 SAT: 94 %
Patient temperature: 98.8
TCO2: 24 mmol/L (ref 0–100)
pCO2 arterial: 37.7 mmHg (ref 35.0–45.0)
pH, Arterial: 7.4 (ref 7.350–7.450)
pO2, Arterial: 72 mmHg — ABNORMAL LOW (ref 80.0–100.0)

## 2014-07-17 LAB — HIV ANTIBODY (ROUTINE TESTING W REFLEX): HIV 1&2 Ab, 4th Generation: NONREACTIVE

## 2014-07-17 LAB — MRSA PCR SCREENING: MRSA by PCR: NEGATIVE

## 2014-07-17 LAB — MAGNESIUM: MAGNESIUM: 2.3 mg/dL (ref 1.5–2.5)

## 2014-07-17 LAB — AMMONIA: AMMONIA: 30 umol/L (ref 11–60)

## 2014-07-17 MED ORDER — LEVOFLOXACIN 750 MG PO TABS
750.0000 mg | ORAL_TABLET | Freq: Every day | ORAL | Status: DC
Start: 2014-07-17 — End: 2014-08-14

## 2014-07-17 MED ORDER — METHYLPREDNISOLONE SODIUM SUCC 125 MG IJ SOLR
125.0000 mg | Freq: Two times a day (BID) | INTRAMUSCULAR | Status: DC
  Filled 2014-07-17 (×2): qty 2

## 2014-07-17 MED ORDER — ONDANSETRON HCL 4 MG PO TABS: 4.0000 mg | ORAL_TABLET | Freq: Three times a day (TID) | ORAL | Status: DC | PRN

## 2014-07-17 MED ORDER — LEVETIRACETAM 500 MG PO TABS
500.0000 mg | ORAL_TABLET | Freq: Two times a day (BID) | ORAL | Status: DC
  Filled 2014-07-17 (×2): qty 1

## 2014-07-17 MED ORDER — QUETIAPINE FUMARATE 100 MG PO TABS: 100.0000 mg | ORAL_TABLET | Freq: Every day | ORAL | Status: DC

## 2014-07-17 MED ORDER — NALOXONE HCL 0.4 MG/ML IJ SOLN
INTRAMUSCULAR | Status: AC
  Filled 2014-07-17: qty 1

## 2014-07-17 MED ORDER — POTASSIUM CHLORIDE IN NACL 20-0.9 MEQ/L-% IV SOLN
INTRAVENOUS | Status: DC
  Filled 2014-07-17 (×3): qty 1000

## 2014-07-17 MED ORDER — METHYLPREDNISOLONE SODIUM SUCC 125 MG IJ SOLR
125.0000 mg | Freq: Once | INTRAMUSCULAR | Status: AC
  Administered 2014-07-17: 125 mg via INTRAVENOUS
  Filled 2014-07-17: qty 2

## 2014-07-17 MED ORDER — LEVETIRACETAM 500 MG PO TABS
500.0000 mg | ORAL_TABLET | Freq: Two times a day (BID) | ORAL | Status: DC
  Filled 2014-07-17 (×3): qty 1

## 2014-07-17 MED ORDER — IPRATROPIUM-ALBUTEROL 0.5-2.5 (3) MG/3ML IN SOLN
3.0000 mL | RESPIRATORY_TRACT | Status: DC
Start: 2014-07-17 — End: 2014-07-17
  Administered 2014-07-17 (×2): 3 mL via RESPIRATORY_TRACT
  Filled 2014-07-17 (×2): qty 3

## 2014-07-17 MED ORDER — HYDROCODONE-ACETAMINOPHEN 5-325 MG PO TABS
1.0000 | ORAL_TABLET | Freq: Once | ORAL | Status: AC
  Administered 2014-07-17: 1 via ORAL
  Filled 2014-07-17: qty 1

## 2014-07-17 MED ORDER — IPRATROPIUM-ALBUTEROL 0.5-2.5 (3) MG/3ML IN SOLN
3.0000 mL | RESPIRATORY_TRACT | Status: AC
  Administered 2014-07-17: 3 mL via RESPIRATORY_TRACT
  Filled 2014-07-17: qty 3

## 2014-07-17 MED ORDER — IPRATROPIUM-ALBUTEROL 0.5-2.5 (3) MG/3ML IN SOLN: 3.0000 mL | RESPIRATORY_TRACT | Status: DC | PRN

## 2014-07-17 MED ORDER — METOCLOPRAMIDE HCL 10 MG PO TABS
10.0000 mg | ORAL_TABLET | Freq: Once | ORAL | Status: AC
  Administered 2014-07-17: 10 mg via ORAL
  Filled 2014-07-17: qty 1

## 2014-07-17 MED ORDER — SODIUM CHLORIDE 0.9 % IV BOLUS (SEPSIS)
1000.0000 mL | Freq: Once | INTRAVENOUS | Status: AC
  Administered 2014-07-17: 1000 mL via INTRAVENOUS

## 2014-07-17 MED ORDER — NALOXONE HCL 0.4 MG/ML IJ SOLN
0.4000 mg | Freq: Once | INTRAMUSCULAR | Status: AC
  Administered 2014-07-17: 0.4 mg via INTRAVENOUS

## 2014-07-17 MED ORDER — AZITHROMYCIN 250 MG PO TABS
500.0000 mg | ORAL_TABLET | Freq: Once | ORAL | Status: AC
  Administered 2014-07-17: 500 mg via ORAL
  Filled 2014-07-17: qty 2

## 2014-07-17 MED ORDER — HEPARIN SODIUM (PORCINE) 5000 UNIT/ML IJ SOLN
5000.0000 [IU] | Freq: Three times a day (TID) | INTRAMUSCULAR | Status: DC
  Filled 2014-07-17 (×3): qty 1

## 2014-07-17 MED ORDER — LEVOFLOXACIN IN D5W 750 MG/150ML IV SOLN
750.0000 mg | INTRAVENOUS | Status: DC
  Administered 2014-07-17: 750 mg via INTRAVENOUS

## 2014-07-17 MED ORDER — PANTOPRAZOLE SODIUM 40 MG PO TBEC: 80.0000 mg | DELAYED_RELEASE_TABLET | Freq: Every day | ORAL | Status: DC

## 2014-07-17 MED ORDER — DIAZEPAM 5 MG PO TABS: 5.0000 mg | ORAL_TABLET | Freq: Two times a day (BID) | ORAL | Status: DC | PRN

## 2014-07-17 MED ORDER — LEVOFLOXACIN IN D5W 750 MG/150ML IV SOLN
750.0000 mg | Freq: Once | INTRAVENOUS | Status: DC
  Filled 2014-07-17: qty 150

## 2014-07-17 NOTE — Discharge Summary (Signed)
Isabella Buchanan, is a 33 y.o. female  DOB 1980-12-10  MRN [id number].  Admission date:  07/16/2014  Admitting Physician  Doree Albee, MD  Discharge Date:  07/17/2014   Primary MD  [name], MD  Recommendations for primary care physician for things to follow:   Follow sedative use, outpatient Neuro follow up recommended, she refused EEG or any further Testing here, refused UA or UDS.  Follow final Keppra Level and HIV serology.   Admission Diagnosis  Community acquired pneumonia [486]   Discharge Diagnosis  Community acquired pneumonia [486]    Active Problems:   Community acquired pneumonia   CAP (community acquired pneumonia)   Sepsis   Encephalopathy   Acute respiratory failure      Past Medical History  Diagnosis Date  . Anxiety   . Fibromyalgia   . Restless leg   . Chronic back pain   . GERD (gastroesophageal reflux disease)   . Headache(784.0)     mygrains  . PONV (postoperative nausea and vomiting)   . Interstitial cystitis   . Polycystic ovarian syndrome   . Hiatal hernia   . Seizures   . IBS (irritable bowel syndrome)   . Chronic pain   . Chronic flank pain   . Kidney stones     Past Surgical History  Procedure Laterality Date  . Cholecystectomy    . Tonsillectomy    . Tubal ligation    . Tympanoplasty    . Esophagogastroduodenoscopy endoscopy         History of present illness and  Hospital Course:     Kindly see H&P for history of present illness and admission details, please review complete Labs, Consult reports and Test reports for all details in brief  HPI  from the history and physical done on the day of admission  This is a 33 y.o. year old female with significant past medical history of tobacco abuse, seziure disorder,chronic pain, fibromyalgia presenting with  sepsis, acute resp failure, CAP, encephalopathy. Level V caveat as pt is somnolent on exam. Pt states that she has had cough, increased WOB over the past day. No fevers or chills. Denies CP.  On presentation to the ER, temp 102.9, HR 80s-140s, RR in 20s, BP 80s-160s now in 90s s/p IVF. Satting 93% on RA. WBC 14.5, Hgb 13.3, K 3.6. CXR w/ LLL atelectasis w/o consolidation. Lactate WNL. Pt also with worsening somnolence from ER evaluation. Pt denies taking any illicit drugs.   Denies taking any additional medication. Pt noted to be on multiple neurotropic/sedating medications. Remotely denies any seizure activity, though she was seen for this in the ER 5 days ago.    Hospital Course   1. Acute Hypoxic Resp Failure due to CAP - much improved after 1 dose of IV ABX and steroid, symptom free at baseline, wants to go home, refuses all further testing, also polypharmacy likely playing a role on multiple sedatives (reduced dosages on DC), she refused UA or UDS, says she is not pregnant but  wont allow UR.pregnancy test.     2.Encephalopathy - multifactorial - sepsis + polypharmacy - back to baseline, no Bowel/bladder incontinence, takes Vimpat and Keppra (did not miss a dose), refused EEG -UDS- UA, any further testing, wants to be DC's, assumes all responsibility from missed seizure was warned of accident-death-disability, requested to follow with PCP-Neurologist within 1 week.   3.Chr Pain - reduced sedative Med doses.     Pending Keppra level and HIV serology, request PCP to follow next visit.   Discharge Condition:  Stable   Follow UP  Follow-up Information   Follow up with [name], MD. Schedule an appointment as soon as possible for a visit in 1 week.   Specialty:  Family Medicine   Contact information:   [phone] [place] [address] [phone]       Follow up with [facility]. Schedule an appointment as soon as possible for a  visit in 1 week. (or your Neurologist)    Contact information:   [address]     [address] [place] [address] [phone]        Discharge Instructions  and  Discharge Medications      Discharge Instructions   Diet - low sodium heart healthy    Complete by:  As directed      Discharge instructions    Complete by:  As directed   and your Neurologist            Medication List    STOP taking these medications       promethazine 25 MG tablet  Commonly known as:  PHENERGAN      TAKE these medications       carisoprodol 350 MG tablet  Commonly known as:  SOMA  Take 350 mg by mouth daily.     dexlansoprazole 60 MG capsule  Commonly known as:  DEXILANT  Take 60 mg by mouth daily as needed (for acid reflux).     diazepam 5 MG tablet  Commonly known as:  VALIUM  Take 1 tablet (5 mg total) by mouth every 12 (twelve) hours as needed. FOR SEIZURES     diphenhydrAMINE 25 MG tablet  Commonly known as:  BENADRYL  Take 50 mg by mouth every 4 (four) hours as needed for allergies.     HYDROcodone-acetaminophen 10-325 MG per tablet  Commonly known as:  NORCO  Take 1 tablet by mouth every 6 (six) hours as needed for moderate pain or severe pain.     ibuprofen 200 MG tablet  Commonly known as:  ADVIL,MOTRIN  Take 800 mg by mouth every 6 (six) hours as needed for pain.     lacosamide 200 MG Tabs tablet  Commonly known as:  VIMPAT  Take 200 mg by mouth 2 (two) times daily.     levETIRAcetam 500 MG tablet  Commonly known as:  KEPPRA  Take 1 tablet (500 mg total) by mouth 2 (two) times daily.     levofloxacin 750 MG tablet  Commonly known as:  LEVAQUIN  Take 1 tablet (750 mg total) by mouth daily.     ondansetron 4 MG tablet  Commonly known as:  ZOFRAN  Take 1 tablet (4 mg total) by mouth every 8 (eight) hours as needed for nausea or vomiting.     pramipexole 1 MG tablet  Commonly known as:  MIRAPEX  Take 1 mg by mouth at bedtime.     QUEtiapine 100 MG  tablet  Commonly known as:  SEROQUEL  Take 1 tablet (100 mg total) by mouth at bedtime.          Diet and Activity recommendation: See Discharge Instructions above   Consults obtained - Neuro   Major procedures and Radiology Reports - PLEASE review detailed and final reports for all details, in brief -       Dg Chest 2 View  07/16/2014   CLINICAL DATA:  Cough and fever  EXAM: CHEST  2 VIEW  COMPARISON:  November 18, 2013  FINDINGS: There is slight left lower lobe atelectatic change. There is no edema or consolidation. The heart size and pulmonary vascularity are normal. No adenopathy. No bone lesions.  IMPRESSION: Mild left lower lobe atelectatic change.  No edema or consolidation.   Electronically Signed   By: Bretta Bang M.D.   On: 07/16/2014 21:47    Micro Results      Recent Results (from the past 240 hour(s))  MRSA PCR SCREENING     Status: None   Collection Time    07/17/14  4:05 AM      Result Value Ref Range Status   MRSA by PCR NEGATIVE  NEGATIVE Final   Comment:            The GeneXpert MRSA Assay (FDA     approved for NASAL specimens     only), is one component of a     comprehensive MRSA colonization     surveillance program. It is not     intended to diagnose MRSA     infection nor to guide or     monitor treatment for     MRSA infections.       Today   Subjective:   Isabella Buchanan today has no headache,no chest abdominal pain,no new weakness tingling or numbness, feels much better wants to go home today does not want any further testing or EEG, wants to be discharged NOW.  Objective:   Blood pressure 110/65, pulse 83, temperature 98 F (36.7 C), temperature source Oral, resp. rate 13, height  (1.676 m), weight 101.4 kg (223 lb 8.7 oz), last menstrual period 06/05/2014, SpO2 96.00%.  No intake or output data in the 24 hours ending 07/17/14 0853  Exam Awake Alert, Oriented x 3, No new F.N deficits, Normal  affect Lavina.AT,PERRAL Supple Neck,No JVD, No cervical lymphadenopathy appriciated.  Symmetrical Chest wall movement, Good air movement bilaterally, CTAB RRR,No Gallops,Rubs or new Murmurs, No Parasternal Heave +ve B.Sounds, Abd Soft, Non tender, No organomegaly appriciated, No rebound -guarding or rigidity. No Cyanosis, Clubbing or edema, No new Rash or bruise  Data Review   CBC w Diff:  Lab Results  Component Value Date   WBC 14.5* 07/16/2014   HGB 13.3 07/16/2014   HCT 41.0 07/16/2014   PLT 310 07/16/2014   LYMPHOPCT 13 07/16/2014   MONOPCT 10 07/16/2014   EOSPCT 4 07/16/2014   BASOPCT 1 07/16/2014    CMP:  Lab Results  Component Value Date   NA 135* 07/16/2014   K 3.6* 07/16/2014   CL 98 07/16/2014   CO2 21 07/16/2014   BUN 11 07/16/2014   CREATININE 0.86 07/16/2014   PROT 7.5 07/16/2014   ALBUMIN 3.5 07/16/2014   BILITOT <0.2* 07/16/2014   ALKPHOS 97 07/16/2014   AST 21 07/16/2014   ALT 18 07/16/2014  .   Total Time in preparing paper work, data evaluation and todays exam - 35 minutes  Leroy Sea M.D on  07/17/2014 at 8:53 AM  [facility] Office  [phone]   **Disclaimer: This note may have been dictated with voice recognition software. Similar sounding words can inadvertently be transcribed and this note may contain transcription errors which may not have been corrected upon publication of note.**

## 2014-07-17 NOTE — H&P (Signed)
Hospitalist Admission History and Physical  Patient name: Isabella Buchanan Medical record number: [id number] Date of birth: [birth year] Age: 33 y.o. Gender: female  Primary Care Provider: [name], MD  Chief Complaint: sepsis, acute resp failure, CAP, encephalopathy  History of Present Illness:This is a 33 y.o. year old female with significant past medical history of tobacco abuse, seziure disorder,chronic pain, fibromyalgia presenting with sepsis, acute resp failure, CAP, encephalopathy. Level V caveat as pt is somnolent on exam. Pt states that she has had cough, increased WOB over the past day. No fevers or chills. Denies CP.  On presentation to the ER, temp 102.9, HR 80s-140s, RR in 20s, BP 80s-160s now in 90s s/p IVF. Satting 93% on RA. WBC 14.5, Hgb 13.3, K 3.6. CXR w/ LLL atelectasis w/o consolidation. Lactate WNL.  Pt also with worsening somnolence from ER evaluation. Pt denies taking any illicit drugs. Denies taking any additional medication. Pt noted to be on multiple neurotropic/sedating medications. Remotely denies any seizure activity, though she was seen for this in the ER 5 days ago.   Assessment and Plan: Isabella Buchanan is a 33 y.o. year old female presenting with sepsis, CAP, acute resp failure, encephalopathy   Active Problems:   Community acquired pneumonia   CAP (community acquired pneumonia)   Sepsis   Encephalopathy   Acute respiratory failure   1-Sepsis/Acute Resp failure -Likely secondary to CAP  -IV levaquin in setting of beta lactam allergy -subclinical obstructive lung disease also likely contributing.  -IV solumedrol x1 -nebs -f/u ABG -pan culture -trend WBC -step down bed -supplemental O2  2-Encephalopathy  -broad ddx including toxic-metabolic, polypharmacy, post ictal state  -minimal response to narcan  -unable to obtain clear history -on multiple neurotropic/sedating meds  -HOLD  -UDS, ammonia level  -consult neuro-f/u  recs-appreciate input   3-fibromyalgia/chronic pain  -HOLD meds   4-Seizure d/o  -keppra level  -f/u neuro recs    FEN/GI: NPO  Prophylaxis: sub q heparin  Disposition: pending further evaluation  Code Status:Full Code    Patient Active Problem List   Diagnosis Date Noted  . Community acquired pneumonia 07/17/2014  . CAP (community acquired pneumonia) 07/17/2014  . Seizure 12/13/2013  . Seizures 12/13/2013  . History of IBS 12/13/2013  . Fibromyalgia   . Chronic back pain   . Interstitial cystitis   . Polycystic ovarian syndrome    Past Medical History: Past Medical History  Diagnosis Date  . Anxiety   . Fibromyalgia   . Restless leg   . Chronic back pain   . GERD (gastroesophageal reflux disease)   . Headache(784.0)     mygrains  . PONV (postoperative nausea and vomiting)   . Interstitial cystitis   . Polycystic ovarian syndrome   . Hiatal hernia   . Seizures   . IBS (irritable bowel syndrome)   . Chronic pain   . Chronic flank pain   . Kidney stones     Past Surgical History: Past Surgical History  Procedure Laterality Date  . Cholecystectomy    . Tonsillectomy    . Tubal ligation    . Tympanoplasty    . Esophagogastroduodenoscopy endoscopy      Social History: History   Social History  . Marital Status: Single    Spouse Name: N/A    Number of Children: N/A  . Years of Education: N/A   Social History Main Topics  . Smoking status: Current Every Day Smoker -- 1.00 packs/day for 15 years  Types: Cigarettes  . Smokeless tobacco: Never Used  . Alcohol Use: No  . Drug Use: No  . Sexual Activity: Yes    Birth Control/ Protection: Surgical   Other Topics Concern  . None   Social History Narrative  . None    Family History: Family History  Problem Relation Age of Onset  . Hypertension Father   . Diabetes Father   . Cancer Other   . Diabetes Other   . Seizures Father     Allergies: Allergies  Allergen Reactions  . Omnicef  [Cefdinir] Swelling    Swelling of tongue  . Codeine Other (See Comments)    Stomach aches  . Sulfa Antibiotics Nausea And Vomiting  . Amoxicillin Rash and Other (See Comments)    vomitting  . Penicillins Rash and Other (See Comments)    vomitting    Current Facility-Administered Medications  Medication Dose Route Frequency Provider Last Rate Last Dose  . heparin injection 5,000 Units  5,000 Units Subcutaneous 3 times per day Doree Albee, MD      . levETIRAcetam (KEPPRA) tablet 500 mg  500 mg Oral BID Doree Albee, MD      . levofloxacin (LEVAQUIN) IVPB 750 mg  750 mg Intravenous Q24H Doree Albee, MD      . methylPREDNISolone sodium succinate (SOLU-MEDROL) 125 mg/2 mL injection 125 mg  125 mg Intravenous Once Tomasita Crumble, MD      . pantoprazole (PROTONIX) EC tablet 80 mg  80 mg Oral Daily Doree Albee, MD       Current Outpatient Prescriptions  Medication Sig Dispense Refill  . carisoprodol (SOMA) 350 MG tablet Take 350 mg by mouth daily.       Marland Kitchen dexlansoprazole (DEXILANT) 60 MG capsule Take 60 mg by mouth daily as needed (for acid reflux).       . diazepam (VALIUM) 5 MG tablet Take 5 mg by mouth every 8 (eight) hours as needed. FOR SEIZURES      . diphenhydrAMINE (BENADRYL) 25 MG tablet Take 50 mg by mouth every 4 (four) hours as needed for allergies.       Marland Kitchen HYDROcodone-acetaminophen (NORCO) 10-325 MG per tablet Take 1 tablet by mouth every 6 (six) hours as needed for moderate pain or severe pain.       Marland Kitchen ibuprofen (ADVIL,MOTRIN) 200 MG tablet Take 800 mg by mouth every 6 (six) hours as needed for pain.       Marland Kitchen lacosamide (VIMPAT) 200 MG TABS tablet Take 200 mg by mouth 2 (two) times daily.      . pramipexole (MIRAPEX) 1 MG tablet Take 1 mg by mouth at bedtime.      . promethazine (PHENERGAN) 25 MG tablet Take 25 mg by mouth every 6 (six) hours as needed for nausea or vomiting.      Marland Kitchen QUEtiapine (SEROQUEL) 100 MG tablet Take 100-200 mg by mouth at bedtime as needed (for sleep).       Marland Kitchen levETIRAcetam (KEPPRA) 500 MG tablet Take 1 tablet (500 mg total) by mouth 2 (two) times daily.  60 tablet  0   Review Of Systems: 12 point ROS negative except as noted above in HPI.  Physical Exam: Filed Vitals:   07/17/14 0249  BP: 92/55  Pulse: 86  Temp:   Resp: 20    General: somnolent, minimally to mildy arousable  HEENT: PERRLA Heart: S1, S2 normal, no murmur, rub or gallop, regular rate and rhythm Lungs: expiratory wheezes and bibasilar rales Abdomen:  abdomen is soft without significant tenderness, masses, organomegaly or guarding Extremities: extremities normal, atraumatic, no cyanosis or edema Skin:no rashes, no ecchymoses Neurology: normal without focal findings  Labs and Imaging: Lab Results  Component Value Date/Time   NA 135* 07/16/2014 10:07 PM   K 3.6* 07/16/2014 10:07 PM   CL 98 07/16/2014 10:07 PM   CO2 21 07/16/2014 10:07 PM   BUN 11 07/16/2014 10:07 PM   CREATININE 0.86 07/16/2014 10:07 PM   GLUCOSE 108* 07/16/2014 10:07 PM   Lab Results  Component Value Date   WBC 14.5* 07/16/2014   HGB 13.3 07/16/2014   HCT 41.0 07/16/2014   MCV 88.6 07/16/2014   PLT 310 07/16/2014    Dg Chest 2 View  07/16/2014   CLINICAL DATA:  Cough and fever  EXAM: CHEST  2 VIEW  COMPARISON:  November 18, 2013  FINDINGS: There is slight left lower lobe atelectatic change. There is no edema or consolidation. The heart size and pulmonary vascularity are normal. No adenopathy. No bone lesions.  IMPRESSION: Mild left lower lobe atelectatic change.  No edema or consolidation.   Electronically Signed   By: Bretta Bang M.D.   On: 07/16/2014 21:47           Doree Albee MD  Pager: [phone]

## 2014-07-17 NOTE — Progress Notes (Signed)
Pt is awake and agitated. She wants her mirapex, soma, norco, and seizure medication. NP notified. New orders received to give PO dose of keppra. Pt now states her neurologist told her not to take keppra and to only take vimpat for her seizure d/o. Pt is refusing IVF and she is threatening to leave AMA, her kids are at the bedside and she says her fiance is in the hospital and just had back surgery. Will continue to monitor.

## 2014-07-17 NOTE — Consult Note (Signed)
NEURO HOSPITALIST CONSULT NOTE    Reason for Consult: altered mental status  HPI:                                                                                                                                          Isabella Buchanan is an 33 y.o. female with a past medical history significant for fibromyalgia, chronic pain syndrome, smoking, seizure disorder on keppra, POS, IBS, brought in due to altered mental status. She has been having fever and cough for the past 2 or 3 days and work up in the ED revealed CAP and evidence of sepsis.  Pt noted to be on multiple neurotropic/sedating medications but denies use of alcohol or illicit drugs. No recent head injury. According to patient's companion, she had a seizure earlier this week. Patient said that she takes her keppra religiously. Denies HA, vertigo, double vision, focal weakness or numbness, slurred speech, language or vision impairment. At this moment, she is very somnolent but able to answer questions correctly, oriented x 4. Serologies so far significant for leukocytosis >14,000, Na  135.   Past Medical History  Diagnosis Date  . Anxiety   . Fibromyalgia   . Restless leg   . Chronic back pain   . GERD (gastroesophageal reflux disease)   . Headache(784.0)     mygrains  . PONV (postoperative nausea and vomiting)   . Interstitial cystitis   . Polycystic ovarian syndrome   . Hiatal hernia   . Seizures   . IBS (irritable bowel syndrome)   . Chronic pain   . Chronic flank pain   . Kidney stones     Past Surgical History  Procedure Laterality Date  . Cholecystectomy    . Tonsillectomy    . Tubal ligation    . Tympanoplasty    . Esophagogastroduodenoscopy endoscopy      Family History  Problem Relation Age of Onset  . Hypertension Father   . Diabetes Father   . Cancer Other   . Diabetes Other   . Seizures Father     Social History:  reports that she has been smoking Cigarettes.  She has a 15  pack-year smoking history. She has never used smokeless tobacco. She reports that she does not drink alcohol or use illicit drugs.  Allergies  Allergen Reactions  . Omnicef [Cefdinir] Swelling    Swelling of tongue  . Codeine Other (See Comments)    Stomach aches  . Sulfa Antibiotics Nausea And Vomiting  . Amoxicillin Rash and Other (See Comments)    vomitting  . Penicillins Rash and Other (See Comments)    vomitting    MEDICATIONS:  I have reviewed the patient's current medications.   ROS:                                                                                                                                       History obtained from the patient, family, and chart review  General ROS: negative for - night sweats, or weight loss Psychological ROS: negative for - behavioral disorder, hallucinations, memory difficulties, mood swings or suicidal ideation Ophthalmic ROS: negative for - blurry vision, double vision, eye pain or loss of vision ENT ROS: negative for - epistaxis, nasal discharge, oral lesions, sore throat, tinnitus or vertigo Allergy and Immunology ROS: negative for - hives or itchy/watery eyes Hematological and Lymphatic ROS: negative for - bleeding problems, bruising or swollen lymph nodes Endocrine ROS: negative for - galactorrhea, hair pattern changes, polydipsia/polyuria or temperature intolerance Respiratory ROS: negative for - cough, hemoptysis, shortness of breath or wheezing Cardiovascular ROS: negative for - chest pain, dyspnea on exertion, edema or irregular heartbeat Gastrointestinal ROS: negative for - abdominal pain, diarrhea, hematemesis, nausea/vomiting or stool incontinence Genito-Urinary ROS: negative for - dysuria, hematuria, incontinence or urinary frequency/urgency Musculoskeletal ROS: negative for - joint swelling or muscular  weakness Neurological ROS: as noted in HPI Dermatological ROS: negative for rash and skin lesion changes  Physical exam: pleasant female in no apparent distress. Blood pressure 103/57, pulse 85, temperature 98.8 F (37.1 C), temperature source Oral, resp. rate 20, last menstrual period 06/05/2014, SpO2 98.00%. Head: normocephalic. Neck: supple, no bruits, no JVD. Cardiac: no murmurs. Lungs: clear. Abdomen: soft, no tender, no mass. Extremities: no edema. Neurologic Examination:                                                                                                      General: Mental Status: Somnolent but alert, oriented x 4, thought content appropriate.  Speech fluent without evidence of aphasia.  Able to follow 3 step commands without difficulty. Cranial Nerves: II: Discs flat bilaterally; Visual fields grossly normal, pupils equal, round, reactive to light and accommodation III,IV, VI: ptosis not present, extra-ocular motions intact bilaterally V,VII: smile symmetric, facial light touch sensation normal bilaterally VIII: hearing normal bilaterally IX,X: gag reflex present XI: bilateral shoulder shrug XII: midline tongue extension without atrophy or fasciculations Motor: Right : Upper extremity   5/5    Left:     Upper extremity   5/5  Lower extremity   5/5     Lower extremity   5/5 Tone  and bulk:normal tone throughout; no atrophy noted Sensory: Pinprick and light touch intact throughout, bilaterally Deep Tendon Reflexes:  Right: Upper Extremity   Left: Upper extremity   biceps (C-5 to C-6) 2/4   biceps (C-5 to C-6) 2/4 tricep (C7) 2/4    triceps (C7) 2/4 Brachioradialis (C6) 2/4  Brachioradialis (C6) 2/4  Lower Extremity Lower Extremity  quadriceps (L-2 to L-4) 2/4   quadriceps (L-2 to L-4) 2/4 Achilles (S1) 2/4   Achilles (S1) 2/4  Plantars: Right: downgoing   Left: downgoing Cerebellar: normal finger-to-nose,  normal heel-to-shin test Gait:  No tested No  meningeal signs.   No results found for this basename: cbc, bmp, coags, chol, tri, ldl, hga1c    Results for orders placed during the hospital encounter of 07/16/14 (from the past 48 hour(s))  CBC WITH DIFFERENTIAL     Status: Abnormal   Collection Time    07/16/14 10:07 PM      Result Value Ref Range   WBC 14.5 (*) 4.0 - 10.5 K/uL   RBC 4.63  3.87 - 5.11 MIL/uL   Hemoglobin 13.3  12.0 - 15.0 g/dL   HCT 41.0  36.0 - 46.0 %   MCV 88.6  78.0 - 100.0 fL   MCH 28.7  26.0 - 34.0 pg   MCHC 32.4  30.0 - 36.0 g/dL   RDW 14.3  11.5 - 15.5 %   Platelets 310  150 - 400 K/uL   Neutrophils Relative % 72  43 - 77 %   Neutro Abs 10.6 (*) 1.7 - 7.7 K/uL   Lymphocytes Relative 13  12 - 46 %   Lymphs Abs 1.9  0.7 - 4.0 K/uL   Monocytes Relative 10  3 - 12 %   Monocytes Absolute 1.4 (*) 0.1 - 1.0 K/uL   Eosinophils Relative 4  0 - 5 %   Eosinophils Absolute 0.5  0.0 - 0.7 K/uL   Basophils Relative 1  0 - 1 %   Basophils Absolute 0.1  0.0 - 0.1 K/uL  COMPREHENSIVE METABOLIC PANEL     Status: Abnormal   Collection Time    07/16/14 10:07 PM      Result Value Ref Range   Sodium 135 (*) 137 - 147 mEq/L   Potassium 3.6 (*) 3.7 - 5.3 mEq/L   Chloride 98  96 - 112 mEq/L   CO2 21  19 - 32 mEq/L   Glucose, Bld 108 (*) 70 - 99 mg/dL   BUN 11  6 - 23 mg/dL   Creatinine, Ser 0.86  0.50 - 1.10 mg/dL   Calcium 8.9  8.4 - 10.5 mg/dL   Total Protein 7.5  6.0 - 8.3 g/dL   Albumin 3.5  3.5 - 5.2 g/dL   AST 21  0 - 37 U/L   ALT 18  0 - 35 U/L   Alkaline Phosphatase 97  39 - 117 U/L   Total Bilirubin <0.2 (*) 0.3 - 1.2 mg/dL   GFR calc non Af Amer 88 (*) >90 mL/min   GFR calc Af Amer >90  >90 mL/min   Comment: (NOTE)     The eGFR has been calculated using the CKD EPI equation.     This calculation has not been validated in all clinical situations.     eGFR's persistently <90 mL/min signify possible Chronic Kidney     Disease.   Anion gap 16 (*) 5 - 15  I-STAT CG4 LACTIC ACID, ED     Status:  None    Collection Time    07/16/14 10:12 PM      Result Value Ref Range   Lactic Acid, Venous 1.90  0.5 - 2.2 mmol/L  I-STAT ARTERIAL BLOOD GAS, ED     Status: Abnormal   Collection Time    07/17/14  3:35 AM      Result Value Ref Range   pH, Arterial 7.400  7.350 - 7.450   pCO2 arterial 37.7  35.0 - 45.0 mmHg   pO2, Arterial 72.0 (*) 80.0 - 100.0 mmHg   Bicarbonate 23.3  20.0 - 24.0 mEq/L   TCO2 24  0 - 100 mmol/L   O2 Saturation 94.0     Acid-base deficit 1.0  0.0 - 2.0 mmol/L   Patient temperature 98.8 F     Collection site RADIAL, ALLEN'S TEST ACCEPTABLE     Drawn by RT     Sample type ARTERIAL      Dg Chest 2 View  07/16/2014   CLINICAL DATA:  Cough and fever  EXAM: CHEST  2 VIEW  COMPARISON:  November 18, 2013  FINDINGS: There is slight left lower lobe atelectatic change. There is no edema or consolidation. The heart size and pulmonary vascularity are normal. No adenopathy. No bone lesions.  IMPRESSION: Mild left lower lobe atelectatic change.  No edema or consolidation.   Electronically Signed   By: Lowella Grip M.D.   On: 07/16/2014 21:47   Assessment/Plan: 33 y.o with altered mental status, non focal neuro-exam, most likely multifactorial ( septic encephalopathy, polypharmacy). Will defer neuro-imaging at this time but obtain EEG to assess degree of encephalopathy and exclude NCSE. Agree with holding psychotropics.  Will follow up.   Dorian Pod, MD 07/17/2014, 3:45 AM

## 2014-07-17 NOTE — ED Notes (Signed)
Pt reports feeling "bad" for two days. Pt c/o shortness of breath with productive cough and fever x 2 days. Inspiratory and expiratory wheezes auscultated prior to treatment

## 2014-07-17 NOTE — ED Notes (Signed)
No response to Narcan; Dr.newton aware. Resp tech aware of ABG

## 2014-07-17 NOTE — ED Notes (Signed)
Pt alert to name; appears more lethargic than initial assesment. EDP aware

## 2014-07-17 NOTE — Progress Notes (Signed)
Discussed D/C instructions with pt and family. Pt verbalizes understanding. Prescriptions given and IV removed. Pt has personal belongings, central telemetry notified of discharge and pt removed from monitor. Pt refused to be transported out in wheelchair. Escorted pt to car as she walked out.

## 2014-07-17 NOTE — Discharge Instructions (Signed)
Follow with Primary MD [name], MD in 7 days   Get CBC, CMP, 2 view Chest X ray checked  by Primary MD next visit.   Do not drive, operating heavy machinery, perform activities at heights, swimming or participation in water activities or provide baby sitting services until you have seen by Primary MD or a Neurologist and advised to do so again.   Activity: As tolerated with Full fall precautions use walker/cane & assistance as needed   Disposition Home     Diet: Heart Healthy    For Heart failure patients - Check your Weight same time everyday, if you gain over 2 pounds, or you develop in leg swelling, experience more shortness of breath or chest pain, call your Primary MD immediately. Follow Cardiac Low Salt Diet and 1.8 lit/day fluid restriction.   On your next visit with her primary care physician please Get Medicines reviewed and adjusted.  Please request your Prim.MD to go over all Hospital Tests and Procedure/Radiological results at the follow up, please get all Hospital records sent to your Prim MD by signing hospital release before you go home.   If you experience worsening of your admission symptoms, develop shortness of breath, life threatening emergency, suicidal or homicidal thoughts you must seek medical attention immediately by calling 911 or calling your MD immediately  if symptoms less severe.  You Must read complete instructions/literature along with all the possible adverse reactions/side effects for all the Medicines you take and that have been prescribed to you. Take any new Medicines after you have completely understood and accpet all the possible adverse reactions/side effects.     Do not drive when taking Pain medications.    Do not take more than prescribed Pain, Sleep and Anxiety Medications  Special Instructions: If you have smoked or chewed Tobacco  in the last 2 yrs please stop smoking, stop any regular Alcohol  and or any Recreational drug  use.  Wear Seat belts while driving.   Please note  You were cared for by a hospitalist during your hospital stay. If you have any questions about your discharge medications or the care you received while you were in the hospital after you are discharged, you can call the unit and asked to speak with the hospitalist on call if the hospitalist that took care of you is not available. Once you are discharged, your primary care physician will handle any further medical issues. Please note that NO REFILLS for any discharge medications will be authorized once you are discharged, as it is imperative that you return to your primary care physician (or establish a relationship with a primary care physician if you do not have one) for your aftercare needs so that they can reassess your need for medications and monitor your lab values.

## 2014-07-17 NOTE — ED Provider Notes (Signed)
CSN: [id number]     Arrival date & time 07/16/14  2048 History   First MD Initiated Contact with Patient 07/16/14 2357     Chief Complaint  Patient presents with  . Fever     (Consider location/radiation/quality/duration/timing/severity/associated sxs/prior Treatment) HPI Isabella Buchanan is a 33 year old female past medical history of fibromyalgia coming in with fever and coughing for the past 2 days. Patient states she's had viral URI like symptoms including subjective fevers and rhinorrhea. Patient states her cough is productive of green sputum. She has sick contacts and her friend who is in the room. Patient denies any history of asthma COPD. She has chest pain with her cough. There is no abdominal pain nausea vomiting diarrhea or changes in her urination.  Past Medical History  Diagnosis Date  . Anxiety   . Fibromyalgia   . Restless leg   . Chronic back pain   . GERD (gastroesophageal reflux disease)   . Headache(784.0)     mygrains  . PONV (postoperative nausea and vomiting)   . Interstitial cystitis   . Polycystic ovarian syndrome   . Hiatal hernia   . Seizures   . IBS (irritable bowel syndrome)   . Chronic pain   . Chronic flank pain   . Kidney stones    Past Surgical History  Procedure Laterality Date  . Cholecystectomy    . Tonsillectomy    . Tubal ligation    . Tympanoplasty    . Esophagogastroduodenoscopy endoscopy     Family History  Problem Relation Age of Onset  . Hypertension Father   . Diabetes Father   . Cancer Other   . Diabetes Other   . Seizures Father    History  Substance Use Topics  . Smoking status: Current Every Day Smoker -- 1.00 packs/day for 15 years    Types: Cigarettes  . Smokeless tobacco: Never Used  . Alcohol Use: No   OB History   Grav Para Term Preterm Abortions TAB SAB Ect Mult Living   Review of Systems 10 Systems reviewed and are negative for acute change except as noted in the HPI.    Allergies   Omnicef; Codeine; Sulfa antibiotics; Amoxicillin; and Penicillins  Home Medications   Prior to Admission medications   Medication Sig Start Date End Date Taking? Authorizing Provider  carisoprodol (SOMA) 350 MG tablet Take 350 mg by mouth daily.    Yes Historical Provider, MD  dexlansoprazole (DEXILANT) 60 MG capsule Take 60 mg by mouth daily as needed (for acid reflux).    Yes Historical Provider, MD  diazepam (VALIUM) 5 MG tablet Take 5 mg by mouth every 8 (eight) hours as needed. FOR SEIZURES   Yes Historical Provider, MD  diphenhydrAMINE (BENADRYL) 25 MG tablet Take 50 mg by mouth every 4 (four) hours as needed for allergies.    Yes Historical Provider, MD  HYDROcodone-acetaminophen (NORCO) 10-325 MG per tablet Take 1 tablet by mouth every 6 (six) hours as needed for moderate pain or severe pain.    Yes Historical Provider, MD  ibuprofen (ADVIL,MOTRIN) 200 MG tablet Take 800 mg by mouth every 6 (six) hours as needed for pain.    Yes Historical Provider, MD  lacosamide (VIMPAT) 200 MG TABS tablet Take 200 mg by mouth 2 (two) times daily.   Yes Historical Provider, MD  pramipexole (MIRAPEX) 1 MG tablet Take 1 mg by mouth at bedtime.  Yes Historical Provider, MD  promethazine (PHENERGAN) 25 MG tablet Take 25 mg by mouth every 6 (six) hours as needed for nausea or vomiting.   Yes Historical Provider, MD  QUEtiapine (SEROQUEL) 100 MG tablet Take 100-200 mg by mouth at bedtime as needed (for sleep).   Yes Historical Provider, MD  levETIRAcetam (KEPPRA) 500 MG tablet Take 1 tablet (500 mg total) by mouth 2 (two) times daily. 07/12/14   Vida Roller, MD   BP 113/69  Pulse 96  Temp(Src) 99 F (37.2 C) (Oral)  Resp 18  SpO2 99%  LMP 06/05/2014 Physical Exam  Nursing note and vitals reviewed. Constitutional: She is oriented to person, place, and time. She appears well-developed and well-nourished. She appears distressed.  HENT:  Head: Normocephalic and atraumatic.  Mouth/Throat: Oropharynx  is clear and moist. No oropharyngeal exudate.  Rhinorrhea present  Eyes: EOM are normal. Pupils are equal, round, and reactive to light. No scleral icterus.  Injected conjunctiva with clear watery drainage.  Neck: Normal range of motion. Neck supple. No JVD present. No tracheal deviation present. No thyromegaly present.  Cardiovascular: Normal rate, regular rhythm and normal heart sounds.  Exam reveals no gallop and no friction rub.   No murmur heard. Pulmonary/Chest: She is in respiratory distress. She has wheezes. She has rales. She exhibits no tenderness.  Diffuse wheezing and rales heard in all 4 lung fields.  Abdominal: Soft. Bowel sounds are normal. She exhibits no distension and no mass. There is no tenderness. There is no rebound and no guarding.  Musculoskeletal: Normal range of motion. She exhibits no edema and no tenderness.  Lymphadenopathy:    She has no cervical adenopathy.  Neurological: She is alert and oriented to person, place, and time.  Skin: Skin is warm and dry. No rash noted. She is not diaphoretic. No erythema. No pallor.    ED Course  Procedures (including critical care time) Labs Review Labs Reviewed  CBC WITH DIFFERENTIAL - Abnormal; Notable for the following:    WBC 14.5 (*)    Neutro Abs 10.6 (*)    Monocytes Absolute 1.4 (*)    All other components within normal limits  COMPREHENSIVE METABOLIC PANEL - Abnormal; Notable for the following:    Sodium 135 (*)    Potassium 3.6 (*)    Glucose, Bld 108 (*)    Total Bilirubin <0.2 (*)    GFR calc non Af Amer 88 (*)    Anion gap 16 (*)    All other components within normal limits  I-STAT CG4 LACTIC ACID, ED  I-STAT CG4 LACTIC ACID, ED    Imaging Review Dg Chest 2 View  07/16/2014   CLINICAL DATA:  Cough and fever  EXAM: CHEST  2 VIEW  COMPARISON:  November 18, 2013  FINDINGS: There is slight left lower lobe atelectatic change. There is no edema or consolidation. The heart size and pulmonary vascularity are  normal. No adenopathy. No bone lesions.  IMPRESSION: Mild left lower lobe atelectatic change.  No edema or consolidation.   Electronically Signed   By: Bretta Bang M.D.   On: 07/16/2014 21:47     EKG Interpretation None      MDM   Final diagnoses:  None   Patient comes in emergency department out of concern for her fever and cough. Chest x-ray is normal, however have a high clinical suspicion of pneumonia. Patient has fever productive cough of green sputum for 2 days, and she is hypoxic on room air  to 92%. Patient was treated with levofloxacin and azithromycin. She was also given 3 breathing treatments as well as IV steroids. I was called to the bedside as the patient's blood pressure dropped to 80 systolic. She was given a liter of IV fluids, and will be retained in the hospital on the telemetry unit.    Tomasita Crumble, MD 07/17/14 [id number]

## 2014-07-17 NOTE — Progress Notes (Signed)
Pt scheduled for EEG this AM but she is refusing. Pt also refusing IV fluids, meds, and all other care. Says she just wants to be discharged. Will ambulate pt before discharge to evaluate safety per Dr. Doristine Church request.

## 2014-07-19 LAB — LEVETIRACETAM LEVEL

## 2014-07-23 LAB — CULTURE, BLOOD (ROUTINE X 2)
CULTURE: NO GROWTH
Culture: NO GROWTH

## 2014-07-27 ENCOUNTER — Emergency Department (HOSPITAL_COMMUNITY)
Admission: EM | Admit: 2014-07-27 | Discharge: 2014-07-27 | Disposition: A | Discharge status: Home / Self Care | Payer: Medicaid Other | Attending: Emergency Medicine | Admitting: Emergency Medicine

## 2014-07-27 ENCOUNTER — Encounter (HOSPITAL_COMMUNITY): Payer: Self-pay | Admitting: Emergency Medicine

## 2014-07-27 ENCOUNTER — Emergency Department (HOSPITAL_COMMUNITY): Payer: Medicaid Other

## 2014-07-27 DIAGNOSIS — G40909 Epilepsy, unspecified, not intractable, without status epilepticus: Secondary | ICD-10-CM | POA: Diagnosis present

## 2014-07-27 DIAGNOSIS — K219 Gastro-esophageal reflux disease without esophagitis: Secondary | ICD-10-CM | POA: Insufficient documentation

## 2014-07-27 DIAGNOSIS — Z79899 Other long term (current) drug therapy: Secondary | ICD-10-CM | POA: Diagnosis not present

## 2014-07-27 DIAGNOSIS — F411 Generalized anxiety disorder: Secondary | ICD-10-CM | POA: Insufficient documentation

## 2014-07-27 DIAGNOSIS — R569 Unspecified convulsions: Secondary | ICD-10-CM

## 2014-07-27 DIAGNOSIS — G2581 Restless legs syndrome: Secondary | ICD-10-CM | POA: Insufficient documentation

## 2014-07-27 DIAGNOSIS — Z87442 Personal history of urinary calculi: Secondary | ICD-10-CM | POA: Insufficient documentation

## 2014-07-27 DIAGNOSIS — G8929 Other chronic pain: Secondary | ICD-10-CM | POA: Insufficient documentation

## 2014-07-27 DIAGNOSIS — F17200 Nicotine dependence, unspecified, uncomplicated: Secondary | ICD-10-CM | POA: Diagnosis not present

## 2014-07-27 DIAGNOSIS — Z880 Allergy status to penicillin: Secondary | ICD-10-CM | POA: Diagnosis not present

## 2014-07-27 LAB — CBC WITH DIFFERENTIAL/PLATELET
Basophils Absolute: 0.1 10*3/uL (ref 0.0–0.1)
Basophils Relative: 1 % (ref 0–1)
Eosinophils Absolute: 0.4 10*3/uL (ref 0.0–0.7)
Eosinophils Relative: 4 % (ref 0–5)
HEMATOCRIT: 41.8 % (ref 36.0–46.0)
HEMOGLOBIN: 14.2 g/dL (ref 12.0–15.0)
LYMPHS ABS: 3.9 10*3/uL (ref 0.7–4.0)
Lymphocytes Relative: 34 % (ref 12–46)
MCH: 28.9 pg (ref 26.0–34.0)
MCHC: 34 g/dL (ref 30.0–36.0)
MCV: 85.1 fL (ref 78.0–100.0)
MONOS PCT: 7 % (ref 3–12)
Monocytes Absolute: 0.8 10*3/uL (ref 0.1–1.0)
NEUTROS ABS: 6.1 10*3/uL (ref 1.7–7.7)
Neutrophils Relative %: 54 % (ref 43–77)
Platelets: 338 10*3/uL (ref 150–400)
RBC: 4.91 MIL/uL (ref 3.87–5.11)
RDW: 13.7 % (ref 11.5–15.5)
WBC: 11.3 10*3/uL — ABNORMAL HIGH (ref 4.0–10.5)

## 2014-07-27 LAB — BASIC METABOLIC PANEL
Anion gap: 14 (ref 5–15)
BUN: 13 mg/dL (ref 6–23)
CHLORIDE: 103 meq/L (ref 96–112)
CO2: 25 meq/L (ref 19–32)
Calcium: 9.2 mg/dL (ref 8.4–10.5)
Creatinine, Ser: 0.78 mg/dL (ref 0.50–1.10)
GFR calc Af Amer: >90 mL/min (ref >90)
GFR calc non Af Amer: >90 mL/min (ref >90)
GLUCOSE: 141 mg/dL — AB (ref 70–99)
POTASSIUM: 3.4 meq/L — AB (ref 3.7–5.3)
Sodium: 142 mEq/L (ref 137–147)

## 2014-07-27 LAB — CBG MONITORING, ED: GLUCOSE-CAPILLARY: 155 mg/dL — AB (ref 70–99)

## 2014-07-27 NOTE — ED Provider Notes (Addendum)
CSN: [id number]     Arrival date & time 07/27/14  1919 History   First MD Initiated Contact with Patient 07/27/14 1920     Chief Complaint  Patient presents with  . Seizures     (Consider location/radiation/quality/duration/timing/severity/associated sxs/prior Treatment) Patient is a 33 y.o. female presenting with seizures. The history is provided by the patient (the pt had a sz today.  she has had 3 szs in the last month.  this is an improvement from before).  Seizures Seizure activity on arrival: yes   Seizure type:  Grand mal Preceding symptoms: aura   Initial focality:  Right-sided Episode characteristics: no apnea   Postictal symptoms: confusion   Return to baseline: yes   Severity:  Moderate   Past Medical History  Diagnosis Date  . Anxiety   . Fibromyalgia   . Restless leg   . Chronic back pain   . GERD (gastroesophageal reflux disease)   . Headache(784.0)     mygrains  . PONV (postoperative nausea and vomiting)   . Interstitial cystitis   . Polycystic ovarian syndrome   . Hiatal hernia   . Seizures   . IBS (irritable bowel syndrome)   . Chronic pain   . Chronic flank pain   . Kidney stones    Past Surgical History  Procedure Laterality Date  . Cholecystectomy    . Tonsillectomy    . Tubal ligation    . Tympanoplasty    . Esophagogastroduodenoscopy endoscopy     Family History  Problem Relation Age of Onset  . Hypertension Father   . Diabetes Father   . Cancer Other   . Diabetes Other   . Seizures Father    History  Substance Use Topics  . Smoking status: Current Every Day Smoker -- 1.00 packs/day for 15 years    Types: Cigarettes  . Smokeless tobacco: Never Used  . Alcohol Use: No   OB History   Grav Para Term Preterm Abortions TAB SAB Ect Mult Living   Review of Systems  Constitutional: Negative for appetite change and fatigue.  HENT: Negative for congestion, ear discharge and sinus pressure.   Eyes: Negative for  discharge.  Respiratory: Negative for cough.   Cardiovascular: Negative for chest pain.  Gastrointestinal: Negative for abdominal pain and diarrhea.  Genitourinary: Negative for frequency and hematuria.  Musculoskeletal: Negative for back pain.  Skin: Negative for rash.  Neurological: Positive for seizures. Negative for headaches.  Psychiatric/Behavioral: Negative for hallucinations.      Allergies  Omnicef; Codeine; Sulfa antibiotics; Amoxicillin; and Penicillins  Home Medications   Prior to Admission medications   Medication Sig Start Date End Date Taking? Authorizing Provider  carisoprodol (SOMA) 350 MG tablet Take 350 mg by mouth daily.    Yes Historical Provider, MD  diazepam (VALIUM) 5 MG tablet Take 1 tablet (5 mg total) by mouth every 12 (twelve) hours as needed. FOR SEIZURES 07/17/14  Yes Leroy Sea, MD  HYDROcodone-acetaminophen (NORCO) 10-325 MG per tablet Take 1 tablet by mouth every 6 (six) hours as needed for moderate pain or severe pain.    Yes Historical Provider, MD  lacosamide (VIMPAT) 200 MG TABS tablet Take 200 mg by mouth 2 (two) times daily.   Yes Historical Provider, MD  ondansetron (ZOFRAN) 4 MG tablet Take 1 tablet (4 mg total) by mouth every 8 (eight) hours as needed for nausea or vomiting. 07/17/14  Yes Leroy Sea, MD  pramipexole (MIRAPEX) 1 MG tablet Take 1 mg by mouth at bedtime.   Yes Historical Provider, MD  QUEtiapine (SEROQUEL) 100 MG tablet Take 1 tablet (100 mg total) by mouth at bedtime. 07/17/14  Yes Leroy Sea, MD  dexlansoprazole (DEXILANT) 60 MG capsule Take 60 mg by mouth daily as needed (for acid reflux).     Historical Provider, MD  diphenhydrAMINE (BENADRYL) 25 MG tablet Take 50 mg by mouth every 4 (four) hours as needed for allergies.     Historical Provider, MD  ibuprofen (ADVIL,MOTRIN) 200 MG tablet Take 800 mg by mouth every 6 (six) hours as needed for pain.     Historical Provider, MD  levofloxacin (LEVAQUIN) 750 MG tablet  Take 1 tablet (750 mg total) by mouth daily. 07/17/14   Leroy Sea, MD   BP 140/87  Pulse 92  Temp(Src) 98.5 F (36.9 C) (Oral)  Resp 12  Ht  (1.676 m)  Wt 213 lb (96.616 kg)  BMI 34.40 kg/m2  SpO2 97%  LMP 07/26/2014 Physical Exam  Constitutional: She is oriented to person, place, and time. She appears well-developed.  HENT:  Head: Normocephalic.  Eyes: Conjunctivae and EOM are normal. No scleral icterus.  Neck: Neck supple. No thyromegaly present.  Cardiovascular: Normal rate and regular rhythm.  Exam reveals no gallop and no friction rub.   No murmur heard. Pulmonary/Chest: No stridor. She has no wheezes. She has no rales. She exhibits no tenderness.  Abdominal: She exhibits no distension. There is no tenderness. There is no rebound.  Musculoskeletal: Normal range of motion. She exhibits no edema.  Lymphadenopathy:    She has no cervical adenopathy.  Neurological: She is oriented to person, place, and time. She exhibits normal muscle tone. Coordination normal.  Skin: No rash noted. No erythema.  Psychiatric: She has a normal mood and affect. Her behavior is normal.    ED Course  Procedures (including critical care time) Labs Review Labs Reviewed  CBC WITH DIFFERENTIAL - Abnormal; Notable for the following:    WBC 11.3 (*)    All other components within normal limits  BASIC METABOLIC PANEL - Abnormal; Notable for the following:    Potassium 3.4 (*)    Glucose, Bld 141 (*)    All other components within normal limits  CBG MONITORING, ED - Abnormal; Notable for the following:    Glucose-Capillary 155 (*)    All other components within normal limits    Imaging Review Dg Chest 2 View  07/27/2014   CLINICAL DATA:  Seizures.  Coughing, weakness.  EXAM: CHEST  2 VIEW  COMPARISON:  07/16/2014  FINDINGS: Lungs are somewhat hypoinflated but otherwise clear. There is stable borderline cardiomegaly. Remainder the exam is unchanged.  IMPRESSION: No active cardiopulmonary  disease.   Electronically Signed   By: Elberta Fortis M.D.   On: 07/27/2014 20:04     EKG Interpretation   Date/Time:  Monday July 27 2014 19:25:33 EDT Ventricular Rate:  91 PR Interval:  157 QRS Duration: 75 QT Interval:  347 QTC Calculation: 427 R Axis:   15 Text Interpretation:  Sinus rhythm Borderline T abnormalities, diffuse  leads ED PHYSICIAN INTERPRETATION AVAILABLE IN [facility] Confirmed  by TEST, Record ([id number]) on 07/29/2014 7:15:57 AM      MDM   Final diagnoses:  Seizure    Seizure.  Pt is to call her neurologist dr. Gerilyn Pilgrim tomorrow    [name], MD 07/27/14 2037  [name], MD 08/10/14 [phone]

## 2014-07-27 NOTE — ED Notes (Signed)
Patient verbalizes understanding of discharge instructions, follow up care and home care. Patient ambulatory out of department at this time escorted by significant other.

## 2014-07-27 NOTE — Discharge Instructions (Signed)
Call Dr. Gerilyn Pilgrim tomorrow and left him know about your seizures

## 2014-07-27 NOTE — ED Notes (Signed)
Patient via [facility] c/o seizure tonight that lasted 5-6 minutes per patient. Upon arrival patient is A&OX4 but lethargic and complaining of general body soreness. Patient states she has a history of seizures but they are lasting longer than normal. No loss of bowel or bladder. cbg per [facility] 168

## 2014-08-14 ENCOUNTER — Encounter (HOSPITAL_COMMUNITY): Payer: Self-pay | Admitting: Emergency Medicine

## 2014-08-14 ENCOUNTER — Emergency Department (HOSPITAL_COMMUNITY)
Admission: EM | Admit: 2014-08-14 | Discharge: 2014-08-15 | Disposition: A | Discharge status: Home / Self Care | Payer: Medicaid Other | Attending: Emergency Medicine | Admitting: Emergency Medicine

## 2014-08-14 DIAGNOSIS — F17200 Nicotine dependence, unspecified, uncomplicated: Secondary | ICD-10-CM | POA: Diagnosis not present

## 2014-08-14 DIAGNOSIS — Z79899 Other long term (current) drug therapy: Secondary | ICD-10-CM | POA: Diagnosis not present

## 2014-08-14 DIAGNOSIS — Z862 Personal history of diseases of the blood and blood-forming organs and certain disorders involving the immune mechanism: Secondary | ICD-10-CM | POA: Diagnosis not present

## 2014-08-14 DIAGNOSIS — G40909 Epilepsy, unspecified, not intractable, without status epilepticus: Secondary | ICD-10-CM | POA: Diagnosis not present

## 2014-08-14 DIAGNOSIS — J860 Pyothorax with fistula: Secondary | ICD-10-CM | POA: Diagnosis not present

## 2014-08-14 DIAGNOSIS — F411 Generalized anxiety disorder: Secondary | ICD-10-CM | POA: Diagnosis not present

## 2014-08-14 DIAGNOSIS — Z87442 Personal history of urinary calculi: Secondary | ICD-10-CM | POA: Insufficient documentation

## 2014-08-14 DIAGNOSIS — IMO0001 Reserved for inherently not codable concepts without codable children: Secondary | ICD-10-CM | POA: Insufficient documentation

## 2014-08-14 DIAGNOSIS — R569 Unspecified convulsions: Secondary | ICD-10-CM | POA: Diagnosis present

## 2014-08-14 DIAGNOSIS — K219 Gastro-esophageal reflux disease without esophagitis: Secondary | ICD-10-CM | POA: Insufficient documentation

## 2014-08-14 DIAGNOSIS — Z880 Allergy status to penicillin: Secondary | ICD-10-CM | POA: Diagnosis not present

## 2014-08-14 DIAGNOSIS — Z8639 Personal history of other endocrine, nutritional and metabolic disease: Secondary | ICD-10-CM | POA: Insufficient documentation

## 2014-08-14 DIAGNOSIS — G8929 Other chronic pain: Secondary | ICD-10-CM | POA: Diagnosis not present

## 2014-08-14 LAB — BASIC METABOLIC PANEL
Anion gap: 11 (ref 5–15)
BUN: 13 mg/dL (ref 6–23)
CALCIUM: 8.8 mg/dL (ref 8.4–10.5)
CO2: 26 mEq/L (ref 19–32)
Chloride: 101 mEq/L (ref 96–112)
Creatinine, Ser: 0.67 mg/dL (ref 0.50–1.10)
GFR calc Af Amer: >90 mL/min (ref >90)
GFR calc non Af Amer: >90 mL/min (ref >90)
Glucose, Bld: 111 mg/dL — ABNORMAL HIGH (ref 70–99)
Potassium: 3.8 mEq/L (ref 3.7–5.3)
SODIUM: 138 meq/L (ref 137–147)

## 2014-08-14 LAB — CBC WITH DIFFERENTIAL/PLATELET
Band Neutrophils: 0 % (ref 0–10)
Basophils Absolute: 0.1 10*3/uL (ref 0.0–0.1)
Basophils Relative: 1 % (ref 0–1)
Eosinophils Absolute: 0.4 10*3/uL (ref 0.0–0.7)
Eosinophils Relative: 5 % (ref 0–5)
HEMATOCRIT: 40.7 % (ref 36.0–46.0)
Hemoglobin: 13.6 g/dL (ref 12.0–15.0)
LYMPHS ABS: 2.5 10*3/uL (ref 0.7–4.0)
Lymphocytes Relative: 28 % (ref 12–46)
MCH: 28.7 pg (ref 26.0–34.0)
MCHC: 33.4 g/dL (ref 30.0–36.0)
MCV: 85.9 fL (ref 78.0–100.0)
MONO ABS: 0.6 10*3/uL (ref 0.1–1.0)
Monocytes Relative: 7 % (ref 3–12)
NEUTROS PCT: 59 % (ref 43–77)
Neutro Abs: 5.2 10*3/uL (ref 1.7–7.7)
Platelets: 295 10*3/uL (ref 150–400)
RBC: 4.74 MIL/uL (ref 3.87–5.11)
RDW: 13.6 % (ref 11.5–15.5)
WBC: 8.9 10*3/uL (ref 4.0–10.5)

## 2014-08-14 MED ORDER — SODIUM CHLORIDE 0.9 % IV BOLUS (SEPSIS)
1000.0000 mL | Freq: Once | INTRAVENOUS | Status: AC
  Administered 2014-08-14: 1000 mL via INTRAVENOUS

## 2014-08-14 MED ORDER — ONDANSETRON HCL 4 MG/2ML IJ SOLN
4.0000 mg | Freq: Once | INTRAMUSCULAR | Status: AC
  Administered 2014-08-14: 4 mg via INTRAVENOUS
  Filled 2014-08-14: qty 2

## 2014-08-14 MED ORDER — MORPHINE SULFATE 4 MG/ML IJ SOLN
4.0000 mg | Freq: Once | INTRAMUSCULAR | Status: AC
  Administered 2014-08-14: 4 mg via INTRAVENOUS
  Filled 2014-08-14: qty 1

## 2014-08-14 MED ORDER — OXYCODONE-ACETAMINOPHEN 5-325 MG PO TABS: 2.0000 | ORAL_TABLET | ORAL | Status: DC | PRN

## 2014-08-14 NOTE — ED Provider Notes (Signed)
CSN: [id number]     Arrival date & time 08/14/14  2036 History   First MD Initiated Contact with Patient 08/14/14 2106    This chart was scribed for Donnetta Hutching, MD by Gwenevere Abbot, ED scribe. This patient was seen in room APA14/APA14 and the patient's care was started at 9:17 PM.  Chief Complaint  Patient presents with  . Seizures    The history is provided by the patient. No language interpreter was used.   HPI Comments:  RONETTE HANK is a 33 y.o. female who presents to the Emergency Department complaining of a seizure. Pt does not remember anything except EMS arriving. Spouse reports that pt has a h/o partial left frontal lobe seizures. Significant other reports that tonight, pt had an 8 minutes seizure, with associated symptoms of contracting muscles, eyes rolling back in her head, and fighting motions. Significant other reports that after seizure ended, she still had continued eye movements for six minutes, but was unresponsive. Significant other reports that he used flashlight to look at her eyes and her pupils were not reactive to the light. Pt reports that she was diagnosed with seizure disorder in 10/14 and had had approximately 85 seizures since that time. Pt reports that she is currently taking Vimpat 200 mg twice a day, and Valium. Pt reports that she has followed her medication plan. Pt is also taking Seroquel for sleep.  Pt reports that she was sick 1 month ago with pneumonia. Pt reports that she is currently experiencing general myalgias and arthralgias.  Pt reports that she sees a neurologist, Dr. Gerilyn Pilgrim. Pt reports that prior to seeing Dr. Gerilyn Pilgrim, she was taking Keppra, and responding well, but Dr. Gerilyn Pilgrim took her off of the medication. Pt denies hitting her head.   Past Medical History  Diagnosis Date  . Anxiety   . Fibromyalgia   . Restless leg   . Chronic back pain   . GERD (gastroesophageal reflux disease)   . Headache(784.0)     mygrains  . PONV (postoperative  nausea and vomiting)   . Interstitial cystitis   . Polycystic ovarian syndrome   . Hiatal hernia   . Seizures   . IBS (irritable bowel syndrome)   . Chronic pain   . Chronic flank pain   . Kidney stones    Past Surgical History  Procedure Laterality Date  . Cholecystectomy    . Tonsillectomy    . Tubal ligation    . Tympanoplasty    . Esophagogastroduodenoscopy endoscopy     Family History  Problem Relation Age of Onset  . Hypertension Father   . Diabetes Father   . Cancer Other   . Diabetes Other   . Seizures Father    History  Substance Use Topics  . Smoking status: Current Every Day Smoker -- 1.00 packs/day for 15 years    Types: Cigarettes  . Smokeless tobacco: Never Used  . Alcohol Use: No   OB History   Grav Para Term Preterm Abortions TAB SAB Ect Mult Living   Review of Systems  Musculoskeletal: Positive for arthralgias and myalgias.  Neurological: Positive for seizures.    10 Systems reviewed and are negative for acute change except as noted in the HPI.   Allergies  Omnicef; Codeine; Sulfa antibiotics; Amoxicillin; and Penicillins  Home Medications   Prior to Admission medications   Medication Sig Start Date End Date Taking? Authorizing  Provider  carisoprodol (SOMA) 350 MG tablet Take 350 mg by mouth daily.    Yes Historical Provider, MD  dexlansoprazole (DEXILANT) 60 MG capsule Take 60 mg by mouth daily as needed (for acid reflux).    Yes Historical Provider, MD  diazepam (VALIUM) 5 MG tablet Take 1 tablet (5 mg total) by mouth every 12 (twelve) hours as needed. FOR SEIZURES 07/17/14  Yes Leroy Sea, MD  diphenhydrAMINE (BENADRYL) 25 MG tablet Take 50 mg by mouth every 4 (four) hours as needed for allergies.    Yes Historical Provider, MD  HYDROcodone-acetaminophen (NORCO) 10-325 MG per tablet Take 1 tablet by mouth every 6 (six) hours as needed for moderate pain or severe pain.    Yes Historical Provider, MD  ibuprofen  (ADVIL,MOTRIN) 200 MG tablet Take 800 mg by mouth every 6 (six) hours as needed for pain.    Yes Historical Provider, MD  lacosamide (VIMPAT) 200 MG TABS tablet Take 200 mg by mouth 2 (two) times daily.   Yes Historical Provider, MD  ondansetron (ZOFRAN) 4 MG tablet Take 1 tablet (4 mg total) by mouth every 8 (eight) hours as needed for nausea or vomiting. 07/17/14  Yes Leroy Sea, MD  pramipexole (MIRAPEX) 1 MG tablet Take 1 mg by mouth at bedtime.   Yes Historical Provider, MD  QUEtiapine (SEROQUEL) 50 MG tablet Take 50 mg by mouth at bedtime.   Yes Historical Provider, MD  oxyCODONE-acetaminophen (PERCOCET) 5-325 MG per tablet Take 2 tablets by mouth every 4 (four) hours as needed. 08/14/14   Donnetta Hutching, MD   BP 118/78  Pulse 87  Temp(Src) 98.9 F (37.2 C)  Resp 16  Ht  (1.702 m)  Wt 214 lb (97.07 kg)  BMI 33.51 kg/m2  SpO2 96%  LMP 08/06/2014 Physical Exam  Nursing note and vitals reviewed. Constitutional: She is oriented to person, place, and time. She appears well-developed and well-nourished.  HENT:  Head: Normocephalic and atraumatic.  Eyes: Conjunctivae and EOM are normal. Pupils are equal, round, and reactive to light.  Neck: Normal range of motion. Neck supple.  Cardiovascular: Normal rate, regular rhythm and normal heart sounds.   Pulmonary/Chest: Effort normal and breath sounds normal.  Abdominal: Soft. Bowel sounds are normal.  Musculoskeletal: Normal range of motion.  Neurological: She is alert and oriented to person, place, and time.  Skin: Skin is warm and dry.  Psychiatric: She has a normal mood and affect. Her behavior is normal.    ED Course  Procedures  DIAGNOSTIC STUDIES: Oxygen Saturation is 96% on RA, adequate by my interpretation.  COORDINATION OF CARE: 9:27 PM-Discussed treatment plan which includes blood work, medication for pain, and hydration with pt at bedside and pt agreed to plan.  Labs Review Labs Reviewed  BASIC METABOLIC PANEL -  Abnormal; Notable for the following:    Glucose, Bld 111 (*)    All other components within normal limits  CBC WITH DIFFERENTIAL    Imaging Review No results found.   EKG Interpretation None      MDM   Final diagnoses:  Seizure    Patient alert without neurological deficits. Labs normal. Patient has neurological followup.  I personally performed the services described in this documentation, which was scribed in my presence. The recorded information has been reviewed and is accurate.    Donnetta Hutching, MD 08/15/14 2012

## 2014-08-14 NOTE — ED Notes (Signed)
Per EMS, family witnessed 2 seizures by patient.  Patient was groggy and able to answer questions.

## 2014-08-14 NOTE — Discharge Instructions (Signed)
Blood work was normal. Followup your neurologist. Small prescription for pain medicine

## 2014-08-20 ENCOUNTER — Encounter (HOSPITAL_COMMUNITY): Payer: Self-pay | Admitting: Emergency Medicine

## 2014-08-20 ENCOUNTER — Emergency Department (HOSPITAL_COMMUNITY)
Admission: EM | Admit: 2014-08-20 | Discharge: 2014-08-20 | Disposition: A | Discharge status: Home / Self Care | Payer: Medicaid Other | Attending: Emergency Medicine | Admitting: Emergency Medicine

## 2014-08-20 DIAGNOSIS — G8929 Other chronic pain: Secondary | ICD-10-CM | POA: Insufficient documentation

## 2014-08-20 DIAGNOSIS — K219 Gastro-esophageal reflux disease without esophagitis: Secondary | ICD-10-CM | POA: Diagnosis not present

## 2014-08-20 DIAGNOSIS — R51 Headache: Secondary | ICD-10-CM | POA: Diagnosis not present

## 2014-08-20 DIAGNOSIS — Z720 Tobacco use: Secondary | ICD-10-CM | POA: Diagnosis not present

## 2014-08-20 DIAGNOSIS — Z79899 Other long term (current) drug therapy: Secondary | ICD-10-CM | POA: Insufficient documentation

## 2014-08-20 DIAGNOSIS — Z87442 Personal history of urinary calculi: Secondary | ICD-10-CM | POA: Diagnosis not present

## 2014-08-20 DIAGNOSIS — R519 Headache, unspecified: Secondary | ICD-10-CM

## 2014-08-20 DIAGNOSIS — G2581 Restless legs syndrome: Secondary | ICD-10-CM | POA: Insufficient documentation

## 2014-08-20 DIAGNOSIS — G40909 Epilepsy, unspecified, not intractable, without status epilepticus: Secondary | ICD-10-CM | POA: Diagnosis not present

## 2014-08-20 DIAGNOSIS — F419 Anxiety disorder, unspecified: Secondary | ICD-10-CM | POA: Diagnosis not present

## 2014-08-20 DIAGNOSIS — Z8639 Personal history of other endocrine, nutritional and metabolic disease: Secondary | ICD-10-CM | POA: Diagnosis not present

## 2014-08-20 DIAGNOSIS — Z8679 Personal history of other diseases of the circulatory system: Secondary | ICD-10-CM | POA: Insufficient documentation

## 2014-08-20 DIAGNOSIS — Z87448 Personal history of other diseases of urinary system: Secondary | ICD-10-CM | POA: Insufficient documentation

## 2014-08-20 DIAGNOSIS — M791 Myalgia, unspecified site: Secondary | ICD-10-CM

## 2014-08-20 DIAGNOSIS — R569 Unspecified convulsions: Secondary | ICD-10-CM

## 2014-08-20 DIAGNOSIS — Z880 Allergy status to penicillin: Secondary | ICD-10-CM | POA: Diagnosis not present

## 2014-08-20 MED ORDER — DIPHENHYDRAMINE HCL 50 MG/ML IJ SOLN
25.0000 mg | Freq: Once | INTRAMUSCULAR | Status: AC
  Administered 2014-08-20: 25 mg via INTRAVENOUS
  Filled 2014-08-20: qty 1

## 2014-08-20 MED ORDER — SODIUM CHLORIDE 0.9 % IV BOLUS (SEPSIS)
1000.0000 mL | Freq: Once | INTRAVENOUS | Status: AC
  Administered 2014-08-20: 1000 mL via INTRAVENOUS

## 2014-08-20 MED ORDER — CYCLOBENZAPRINE HCL 10 MG PO TABS
10.0000 mg | ORAL_TABLET | Freq: Once | ORAL | Status: AC
  Administered 2014-08-20: 10 mg via ORAL
  Filled 2014-08-20: qty 1

## 2014-08-20 MED ORDER — IBUPROFEN 800 MG PO TABS
800.0000 mg | ORAL_TABLET | Freq: Once | ORAL | Status: AC
  Administered 2014-08-20: 800 mg via ORAL
  Filled 2014-08-20: qty 1

## 2014-08-20 MED ORDER — METOCLOPRAMIDE HCL 5 MG/ML IJ SOLN
10.0000 mg | Freq: Once | INTRAMUSCULAR | Status: AC
  Administered 2014-08-20: 10 mg via INTRAVENOUS
  Filled 2014-08-20: qty 2

## 2014-08-20 NOTE — ED Notes (Signed)
Pt brought in by ems for seizures. Per boyfriend she had 2 that lasted about 3 minutes.

## 2014-08-20 NOTE — Discharge Instructions (Signed)
Continue your seizure medications. Recheck with Dr Gerilyn [place] if your seizures are not happening less often.

## 2014-08-20 NOTE — ED Provider Notes (Signed)
CSN: [id number]     Arrival date & time 08/20/14  1956 History  This chart was scribed for Isabella Givens, MD by Isabella Buchanan, ED Scribe. This patient was seen in room APA11/APA11 and the patient's care was started at 8:17 PM.    Chief Complaint  Patient presents with  . Seizures     The history is provided by the patient and a friend. No language interpreter was used.   HPI Comments: Isabella Buchanan is a 33 y.o. female brought in by ambulance, who presents to the Emergency Department for seizures that occurred 40 minutes ago at approximately 1940.  Per boyfriend, patient was sitting on the bed when she suffered two seizures. She did not fall and denies any injury. He reports eye darting back and forth, tensing, contortions, and clenching of the hands when she seizes. He states the first one was 6-7 minutes long, and reports the patient did not wake up before experiencing the second one 5 minutes later. He reports she was still "out of it" when the Fire Department arrive on scene, and they had to use an ammonia capsule to bring her out of it. She reports associated generalized HA, and states she always experiences one after a seizure. Patient has history of seizure (started 1 year ago), however boyfriend reports they they are not as frequent as they have been in the past. He reports that patient used to experience them at least 2 times a week with 2-5 episodes a day and lasting only about 2-4 minuites long. He reports that she now gets them once a week or once every other week, but states they last longer. Patient reports that stress can trigger her seizure, and boyfriend reports that "something did happen" today. He denies any altercation between him and the patient. Patient has started a new medication Lamicatal (25mg , twice a day). She reoprts taking it last night and this morning along with her regular seizure medicaion, Vimpat (200mg , twice day).  Neurologist Isabella Buchanan  PCP Isabella Buchanan at [facility] in [place] first appointment on the 13th of October  Past Medical History  Diagnosis Date  . Anxiety   . Fibromyalgia   . Restless leg   . Chronic back pain   . GERD (gastroesophageal reflux disease)   . Headache(784.0)     mygrains  . PONV (postoperative nausea and vomiting)   . Interstitial cystitis   . Polycystic ovarian syndrome   . Hiatal hernia   . Seizures   . IBS (irritable bowel syndrome)   . Chronic pain   . Chronic flank pain   . Kidney stones    Past Surgical History  Procedure Laterality Date  . Cholecystectomy    . Tonsillectomy    . Tubal ligation    . Tympanoplasty    . Esophagogastroduodenoscopy endoscopy     Family History  Problem Relation Age of Onset  . Hypertension Father   . Diabetes Father   . Cancer Other   . Diabetes Other   . Seizures Father    History  Substance Use Topics  . Smoking status: Current Every Day Smoker -- 1.00 packs/day for 15 years    Types: Cigarettes  . Smokeless tobacco: Never Used  . Alcohol Use: No    Patient has history of fibromyalgia and states she in the process of applying for diasbililty for this along with her seizures.  Patient is a current smoker and drinks EtOH on occasion.  OB  History   Grav Para Term Preterm Abortions TAB SAB Ect Mult Living   2 2  2      2      Review of Systems  Neurological: Positive for seizures and headaches.  All other systems reviewed and are negative.     Allergies  Omnicef; Codeine; Sulfa antibiotics; Amoxicillin; and Penicillins  Home Medications   Prior to Admission medications   Medication Sig Start Date End Date Taking? Authorizing Provider  carisoprodol (SOMA) 350 MG tablet Take 350 mg by mouth daily.    Yes Historical Provider, MD  dexlansoprazole (DEXILANT) 60 MG capsule Take 60 mg by mouth daily as needed (for acid reflux).    Yes Historical Provider, MD  diazepam (VALIUM) 5 MG tablet Take 1 tablet (5 mg total) by mouth  every 12 (twelve) hours as needed. FOR SEIZURES 07/17/14  Yes Isabella Sea, MD  diphenhydrAMINE (BENADRYL) 25 MG tablet Take 50 mg by mouth every 4 (four) hours as needed for allergies.    Yes Historical Provider, MD  ibuprofen (ADVIL,MOTRIN) 200 MG tablet Take 800 mg by mouth every 6 (six) hours as needed for pain.    Yes Historical Provider, MD  lacosamide (VIMPAT) 200 MG TABS tablet Take 200 mg by mouth 2 (two) times daily.   Yes Historical Provider, MD  ondansetron (ZOFRAN) 4 MG tablet Take 1 tablet (4 mg total) by mouth every 8 (eight) hours as needed for nausea or vomiting. 07/17/14  Yes Isabella Sea, MD  oxyCODONE-acetaminophen (PERCOCET) 5-325 MG per tablet Take 2 tablets by mouth every 4 (four) hours as needed. 08/14/14  Yes Isabella Hutching, MD  pramipexole (MIRAPEX) 1 MG tablet Take 1 mg by mouth at bedtime.   Yes Historical Provider, MD  QUEtiapine (SEROQUEL) 50 MG tablet Take 50 mg by mouth at bedtime.   Yes Historical Provider, MD   Triage Vitals: BP 138/98  Pulse 101  Temp(Src) 98.4 F (36.9 C) (Oral)  Resp 16  Ht 5\' 7"  (1.702 m)  Wt 214 lb (97.07 kg)  BMI 33.51 kg/m2  SpO2 96%  LMP 08/20/2014  Vital signs normal except tachycardia   Physical Exam  Nursing note and vitals reviewed. Constitutional: She is oriented to person, place, and time. She appears well-developed and well-nourished.  Non-toxic appearance. She does not appear ill. No distress.  HENT:  Head: Normocephalic and atraumatic.  Right Ear: External ear normal.  Left Ear: External ear normal.  Nose: Nose normal. No mucosal edema or rhinorrhea.  Mouth/Throat: Oropharynx is clear and moist and mucous membranes are normal. No dental abscesses or uvula swelling.  No trauma noted to tongue.  Eyes: Conjunctivae and EOM are normal. Pupils are equal, round, and reactive to light.  Neck: Normal range of motion and full passive range of motion without pain. Neck supple.  Cardiovascular: Normal rate, regular rhythm  and normal heart sounds.  Exam reveals no gallop and no friction rub.   No murmur heard. Pulmonary/Chest: Effort normal and breath sounds normal. No respiratory distress. She has no wheezes. She has no rhonchi. She has no rales. She exhibits no tenderness and no crepitus.  Abdominal: Soft. Normal appearance and bowel sounds are normal. She exhibits no distension. There is no tenderness. There is no rebound and no guarding.  Musculoskeletal: Normal range of motion. She exhibits no edema and no tenderness.  Moves all extremities well.   Neurological: She is alert and oriented to person, place, and time. She has normal strength. No cranial  nerve deficit.  Skin: Skin is warm, dry and intact. No rash noted. No erythema. No pallor.  Psychiatric: She has a normal mood and affect. Her speech is normal and behavior is normal. Her mood appears not anxious.    ED Course  Procedures (including critical care time)  Medications  sodium chloride 0.9 % bolus 1,000 mL (0 mLs Intravenous Stopped 08/20/14 2211)  metoCLOPramide (REGLAN) injection 10 mg (10 mg Intravenous Given 08/20/14 2047)  diphenhydrAMINE (BENADRYL) injection 25 mg (25 mg Intravenous Given 08/20/14 2047)  ibuprofen (ADVIL,MOTRIN) tablet 800 mg (800 mg Oral Given 08/20/14 2211)  cyclobenzaprine (FLEXERIL) tablet 10 mg (10 mg Oral Given 08/20/14 2211)     DIAGNOSTIC STUDIES: Oxygen Saturation is 96% on room air, adequate by my interpretation.    COORDINATION OF CARE: At 2028 Discussed treatment plan with patient which includes migraine cocktail. Patient agrees.  2200 patient states her headache is better however she complains of diffuse body aches. More medications were ordered.  At time of discharge patient is ready to go home.  Review of her chart from January 2015 when she was admitted she had an EEG done while in the hospital and she had a event while she was having the EEG done, however the EEG did not show any abnormality. Dr.  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   Labs Review Labs Reviewed - No data to display  Imaging Review No results found.   EKG Interpretation None      MDM   Final diagnoses:  Seizures  Headache, unspecified headache type  Myalgia    Plan discharge  Isabella AlbeIva Keeshawn Fakhouri, MD, FACEP    I personally performed the services described in this documentation, which was scribed in my presence. The recorded information has been reviewed and considered.  Isabella AlbeIva Marcelino Campos, MD, Isabella Buchanan    Isabella GivensIva L Jazzman Loughmiller, MD 08/21/14 [phone]

## 2014-08-23 ENCOUNTER — Emergency Department (HOSPITAL_COMMUNITY)
Admission: EM | Admit: 2014-08-23 | Discharge: 2014-08-23 | Disposition: A | Discharge status: Home / Self Care | Payer: Medicaid Other | Attending: Emergency Medicine | Admitting: Emergency Medicine

## 2014-08-23 ENCOUNTER — Encounter (HOSPITAL_COMMUNITY): Payer: Self-pay | Admitting: Emergency Medicine

## 2014-08-23 DIAGNOSIS — M797 Fibromyalgia: Secondary | ICD-10-CM

## 2014-08-23 DIAGNOSIS — Z880 Allergy status to penicillin: Secondary | ICD-10-CM | POA: Insufficient documentation

## 2014-08-23 DIAGNOSIS — K219 Gastro-esophageal reflux disease without esophagitis: Secondary | ICD-10-CM | POA: Diagnosis not present

## 2014-08-23 DIAGNOSIS — Z720 Tobacco use: Secondary | ICD-10-CM | POA: Insufficient documentation

## 2014-08-23 DIAGNOSIS — Z87442 Personal history of urinary calculi: Secondary | ICD-10-CM | POA: Insufficient documentation

## 2014-08-23 DIAGNOSIS — Z760 Encounter for issue of repeat prescription: Secondary | ICD-10-CM | POA: Diagnosis present

## 2014-08-23 DIAGNOSIS — G8929 Other chronic pain: Secondary | ICD-10-CM

## 2014-08-23 DIAGNOSIS — F419 Anxiety disorder, unspecified: Secondary | ICD-10-CM | POA: Diagnosis not present

## 2014-08-23 DIAGNOSIS — Z79899 Other long term (current) drug therapy: Secondary | ICD-10-CM | POA: Diagnosis not present

## 2014-08-23 MED ORDER — NAPROXEN 500 MG PO TABS: 500.0000 mg | ORAL_TABLET | Freq: Two times a day (BID) | ORAL | Status: DC

## 2014-08-23 MED ORDER — HYDROMORPHONE HCL 2 MG/ML IJ SOLN
2.0000 mg | Freq: Once | INTRAMUSCULAR | Status: AC
  Administered 2014-08-23: 2 mg via INTRAMUSCULAR
  Filled 2014-08-23: qty 1

## 2014-08-23 NOTE — ED Notes (Signed)
Pt is here for generalized pain due to fibromyalgia and needs pain medications refilled

## 2014-08-23 NOTE — ED Provider Notes (Signed)
CSN: [id number]     Arrival date & time 08/23/14  1411 History  This chart was scribed for Isabella Mulders, MD by Richarda Overlie, ED Scribe. This patient was seen in room APA03/APA03 and the patient's care was started 5:16 PM.   Chief Complaint  Patient presents with  . Medication Refill     HPI HPI Comments: Isabella Buchanan is a 33 y.o. female with a history of seizures presents to the Emergency Department complaining of generalized body pain due to fibromyalgia. She reports her pain worsened this morning because she took her last hydrocodone at [facility]. She reports that she came to the ED 3 days ago via EMS for 3 consecutive seizures. She believes her seizure episode worsened her current symptoms. The patient reports similar episodes in the past and states her pain can last for multiple days. She reports that [facility] powder and advil failed to provide relief to her symptoms.   PCP Rulon Eisenmenger    Past Medical History  Diagnosis Date  . Anxiety   . Fibromyalgia   . Restless leg   . Chronic back pain   . GERD (gastroesophageal reflux disease)   . Headache(784.0)     mygrains  . PONV (postoperative nausea and vomiting)   . Interstitial cystitis   . Polycystic ovarian syndrome   . Hiatal hernia   . Seizures   . IBS (irritable bowel syndrome)   . Chronic pain   . Chronic flank pain   . Kidney stones    Past Surgical History  Procedure Laterality Date  . Cholecystectomy    . Tonsillectomy    . Tubal ligation    . Tympanoplasty    . Esophagogastroduodenoscopy endoscopy     Family History  Problem Relation Age of Onset  . Hypertension Father   . Diabetes Father   . Cancer Other   . Diabetes Other   . Seizures Father    History  Substance Use Topics  . Smoking status: Current Every Day Smoker -- 1.00 packs/day for 15 years    Types: Cigarettes  . Smokeless tobacco: Never Used  . Alcohol Use: No   OB History   Grav Para Term Preterm Abortions TAB SAB Ect Mult Living   2  2  2      2      Review of Systems  Constitutional: Negative for fever and chills.  HENT: Negative for rhinorrhea and sore throat.   Eyes: Negative for visual disturbance.  Respiratory: Negative for cough and shortness of breath.   Cardiovascular: Negative for chest pain and leg swelling.  Gastrointestinal: Positive for nausea. Negative for abdominal pain.  Genitourinary: Negative for dysuria.  Musculoskeletal: Positive for myalgias. Negative for back pain and neck pain.  Skin: Negative for rash.  Neurological: Negative for headaches.  Hematological: Does not bruise/bleed easily.  Psychiatric/Behavioral: Negative for confusion.      Allergies  Omnicef; Codeine; Sulfa antibiotics; Amoxicillin; and Penicillins  Home Medications   Prior to Admission medications   Medication Sig Start Date End Date Taking? Authorizing Provider  Aspirin-Acetaminophen-Caffeine (GOODYS EXTRA STRENGTH) [phone] MG PACK Take 2 Packages by mouth every 6 (six) hours as needed (Pain).   Yes Historical Provider, MD  dexlansoprazole (DEXILANT) 60 MG capsule Take 60 mg by mouth daily as needed (for acid reflux).    Yes Historical Provider, MD  diazepam (VALIUM) 5 MG tablet Take 5 mg by mouth 3 (three) times daily. FOR SEIZURES 07/17/14  Yes Leroy Sea, MD  diphenhydrAMINE (BENADRYL) 25 MG tablet Take 50 mg by mouth every 4 (four) hours as needed for allergies.    Yes Historical Provider, MD  ibuprofen (ADVIL,MOTRIN) 200 MG tablet Take 800 mg by mouth every 6 (six) hours as needed for pain.    Yes Historical Provider, MD  lacosamide (VIMPAT) 200 MG TABS tablet Take 200 mg by mouth 2 (two) times daily.   Yes Historical Provider, MD  lamoTRIgine (LAMICTAL) 25 MG tablet Take 25 mg by mouth 2 (two) times daily.   Yes Historical Provider, MD  ondansetron (ZOFRAN) 4 MG tablet Take 1 tablet (4 mg total) by mouth every 8 (eight) hours as needed for nausea or vomiting. 07/17/14  Yes Leroy SeaPrashant K Singh, MD  pramipexole  (MIRAPEX) 1 MG tablet Take 1 mg by mouth at bedtime.   Yes Historical Provider, MD  QUEtiapine (SEROQUEL) 50 MG tablet Take 50 mg by mouth at bedtime.   Yes Historical Provider, MD  naproxen (NAPROSYN) 500 MG tablet Take 1 tablet (500 mg total) by mouth 2 (two) times daily. 08/23/14   Isabella MuldersScott Emilianna Barlowe, MD   BP 143/102  Pulse 101  Temp(Src) 98.2 F (36.8 C) (Oral)  Resp 18  SpO2 99%  LMP 08/20/2014 Physical Exam  Nursing note and vitals reviewed. Constitutional: She is oriented to person, place, and time. She appears well-developed and well-nourished.  HENT:  Head: Normocephalic and atraumatic.  Cardiovascular: Normal rate, regular rhythm and normal heart sounds.   Pulmonary/Chest: Effort normal and breath sounds normal. No respiratory distress. She has no wheezes. She has no rales.  Abdominal: Soft. Bowel sounds are normal. She exhibits no distension. There is no tenderness.  Musculoskeletal: Normal range of motion. She exhibits no edema.  Neurological: She is alert and oriented to person, place, and time.  Skin: Skin is warm and dry.  Psychiatric: She has a normal mood and affect.    ED Course  Procedures  DIAGNOSTIC STUDIES: Oxygen Saturation is 99% on RA, normal by my interpretation.    COORDINATION OF CARE: 5:23 PM Discussed treatment plan with pt at bedside and pt agreed to plan.   Labs Review Labs Reviewed - No data to display  Imaging Review No results found.   EKG Interpretation None      MDM   Final diagnoses:  Fibromyalgia  Chronic pain   She will history of chronic pain no history of fibromyalgia. Patient states fiber myalgia is been acting up. Patient recently stopped her other fibromyalgia medications. Since transition between physicians. Patient here wanting narcotic pain medicine. Patient given a one-time dose of hydromorphone here IM to help with the acute pain. Former that we would not provide narcotic medication for chronic pain type syndromes.  Patient does have followup with her new doctor but that is a few days away. Patient is nontoxic no acute distress.       I personally performed the services described in this documentation, which was scribed in my presence. The recorded information has been reviewed and is accurate.     Isabella MuldersScott Cena Bruhn, MD 08/23/14 [phone]

## 2014-08-23 NOTE — Discharge Instructions (Signed)
Naprosyn provided for the fibromyalgia. This may or may not help some. Follow up with your doctors as scheduled or if he can get an earlier appointment.

## 2014-08-30 ENCOUNTER — Emergency Department (HOSPITAL_COMMUNITY)
Admission: EM | Admit: 2014-08-30 | Discharge: 2014-08-30 | Disposition: A | Discharge status: Home / Self Care | Payer: Medicaid Other | Attending: Emergency Medicine | Admitting: Emergency Medicine

## 2014-08-30 ENCOUNTER — Encounter (HOSPITAL_COMMUNITY): Payer: Self-pay | Admitting: Emergency Medicine

## 2014-08-30 DIAGNOSIS — R52 Pain, unspecified: Secondary | ICD-10-CM

## 2014-08-30 DIAGNOSIS — Z791 Long term (current) use of non-steroidal anti-inflammatories (NSAID): Secondary | ICD-10-CM | POA: Insufficient documentation

## 2014-08-30 DIAGNOSIS — Z880 Allergy status to penicillin: Secondary | ICD-10-CM | POA: Diagnosis not present

## 2014-08-30 DIAGNOSIS — K219 Gastro-esophageal reflux disease without esophagitis: Secondary | ICD-10-CM | POA: Insufficient documentation

## 2014-08-30 DIAGNOSIS — R569 Unspecified convulsions: Secondary | ICD-10-CM | POA: Diagnosis not present

## 2014-08-30 DIAGNOSIS — R51 Headache: Secondary | ICD-10-CM | POA: Insufficient documentation

## 2014-08-30 DIAGNOSIS — Z79899 Other long term (current) drug therapy: Secondary | ICD-10-CM | POA: Insufficient documentation

## 2014-08-30 DIAGNOSIS — Z720 Tobacco use: Secondary | ICD-10-CM | POA: Insufficient documentation

## 2014-08-30 DIAGNOSIS — Z87442 Personal history of urinary calculi: Secondary | ICD-10-CM | POA: Diagnosis not present

## 2014-08-30 DIAGNOSIS — F419 Anxiety disorder, unspecified: Secondary | ICD-10-CM | POA: Insufficient documentation

## 2014-08-30 DIAGNOSIS — G8929 Other chronic pain: Secondary | ICD-10-CM | POA: Insufficient documentation

## 2014-08-30 DIAGNOSIS — Z8742 Personal history of other diseases of the female genital tract: Secondary | ICD-10-CM | POA: Diagnosis not present

## 2014-08-30 MED ORDER — HYDROCODONE-ACETAMINOPHEN 5-325 MG PO TABS
2.0000 | ORAL_TABLET | Freq: Once | ORAL | Status: AC
  Administered 2014-08-30: 2 via ORAL
  Filled 2014-08-30: qty 2

## 2014-08-30 MED ORDER — HYDROCODONE-ACETAMINOPHEN 10-325 MG PO TABS: 1.0000 | ORAL_TABLET | Freq: Four times a day (QID) | ORAL | Status: DC | PRN

## 2014-08-30 NOTE — Discharge Instructions (Signed)
Hydrocodone as prescribed as needed for your pain.  Followup with your neurologist in the next few days.   Epilepsy Epilepsy is a disorder in which a person has repeated seizures over time. A seizure is a release of abnormal electrical activity in the brain. Seizures can cause a change in attention, behavior, or the ability to remain awake and alert (altered mental status). Seizures often involve uncontrollable shaking (convulsions).  Most people with epilepsy lead normal lives. However, people with epilepsy are at an increased risk of falls, accidents, and injuries. Therefore, it is important to begin treatment right away. CAUSES  Epilepsy has many possible causes. Anything that disturbs the normal pattern of brain cell activity can lead to seizures. This may include:   Head injury.  Birth trauma.  High fever as a child.  Stroke.  Bleeding into or around the brain.  Certain drugs.  Prolonged low oxygen, such as what occurs after CPR efforts.  Abnormal brain development.  Certain illnesses, such as meningitis, encephalitis (brain infection), malaria, and other infections.  An imbalance of nerve signaling chemicals (neurotransmitters).  SIGNS AND SYMPTOMS  The symptoms of a seizure can vary greatly from one person to another. Right before a seizure, you may have a warning (aura) that a seizure is about to occur. An aura may include the following symptoms:  Fear or anxiety.  Nausea.  Feeling like the room is spinning (vertigo).  Vision changes, such as seeing flashing lights or spots. Common symptoms during a seizure include:  Abnormal sensations, such as an abnormal smell or a bitter taste in the mouth.   Sudden, general body stiffness.   Convulsions that involve rhythmic jerking of the face, arm, or leg on one or both sides.   Sudden change in consciousness.   Appearing to be awake but not responding.   Appearing to be asleep but cannot be awakened.    Grimacing, chewing, lip smacking, drooling, tongue biting, or loss of bowel or bladder control. After a seizure, you may feel sleepy for a while. DIAGNOSIS  Your health care provider will ask about your symptoms and take a medical history. Descriptions from any witnesses to your seizures will be very helpful in the diagnosis. A physical exam, including a detailed neurological exam, is necessary. Various tests may be done, such as:   An electroencephalogram (EEG). This is a painless test of your brain waves. In this test, a diagram is created of your brain waves. These diagrams can be interpreted by a specialist.  An MRI of the brain.   A CT scan of the brain.   A spinal tap (lumbar puncture, LP).  Blood tests to check for signs of infection or abnormal blood chemistry. TREATMENT  There is no cure for epilepsy, but it is generally treatable. Once epilepsy is diagnosed, it is important to begin treatment as soon as possible. For most people with epilepsy, seizures can be controlled with medicines. The following may also be used:  A pacemaker for the brain (vagus nerve stimulator) can be used for people with seizures that are not well controlled by medicine.  Surgery on the brain. For some people, epilepsy eventually goes away. HOME CARE INSTRUCTIONS   Follow your health care provider's recommendations on driving and safety in normal activities.  Get enough rest. Lack of sleep can cause seizures.  Only take over-the-counter or prescription medicines as directed by your health care provider. Take any prescribed medicine exactly as directed.  Avoid any known triggers of your  seizures.  Keep a seizure diary. Record what you recall about any seizure, especially any possible trigger.   Make sure the people you live and work with know that you are prone to seizures. They should receive instructions on how to help you. In general, a witness to a seizure should:   Cushion your head  and body.   Turn you on your side.   Avoid unnecessarily restraining you.   Not place anything inside your mouth.   Call for emergency medical help if there is any question about what has occurred.   Follow up with your health care provider as directed. You may need regular blood tests to monitor the levels of your medicine.  SEEK MEDICAL CARE IF:   You develop signs of infection or other illness. This might increase the risk of a seizure.   You seem to be having more frequent seizures.   Your seizure pattern is changing.  SEEK IMMEDIATE MEDICAL CARE IF:   You have a seizure that does not stop after a few moments.   You have a seizure that causes any difficulty in breathing.   You have a seizure that results in a very severe headache.   You have a seizure that leaves you with the inability to speak or use a part of your body.  Document Released: 11/06/2005 Document Revised: 08/27/2013 Document Reviewed: 06/18/2013 [facility] Patient Information 2015 [place], [place]. This information is not intended to replace advice given to you by your health care provider. Make sure you discuss any questions you have with your health care provider.

## 2014-08-30 NOTE — ED Provider Notes (Signed)
CSN: [id number]     Arrival date & time 08/30/14  1634 History  This chart was scribed for Geoffery Lyonsouglas Brazen Domangue, MD by Tonye RoyaltyJoshua Chen, ED Scribe. This patient was seen in room APA18/APA18 and the patient's care was started at 5:35 PM.    Chief Complaint  Patient presents with  . Seizures   Patient is a 33 y.o. female presenting with seizures. The history is provided by the patient. No language interpreter was used.  Seizures Seizure activity on arrival: no   Preceding symptoms: headache   Severity:  Moderate Duration:  6 minutes Timing:  Once Number of seizures this episode:  1  HPI Comments: Isabella Buchanan is a 33 y.o. female who presents to the Emergency Department complaining of sudden seizures and associated pain to her legs, back, and neck with onset earlier today. She states she woke with a headache but felt fine otherwise. Her husband witnessed the seizure and reports shaking and that it lasted 6 minutes. She reports a history of seizure and states she she is compliant with her medications. She states she decided to come to the ED because of her pain. She reports taking Advil and Tylenol for her pain at home; she states she normally has hydrocodone 10s but has run out of them. She denies fever or abdominal pain.  Past Medical History  Diagnosis Date  . Anxiety   . Fibromyalgia   . Restless leg   . Chronic back pain   . GERD (gastroesophageal reflux disease)   . Headache(784.0)     mygrains  . PONV (postoperative nausea and vomiting)   . Interstitial cystitis   . Polycystic ovarian syndrome   . Hiatal hernia   . Seizures   . IBS (irritable bowel syndrome)   . Chronic pain   . Chronic flank pain   . Kidney stones    Past Surgical History  Procedure Laterality Date  . Cholecystectomy    . Tonsillectomy    . Tubal ligation    . Tympanoplasty    . Esophagogastroduodenoscopy endoscopy     Family History  Problem Relation Age of Onset  . Hypertension Father   . Diabetes Father    . Cancer Other   . Diabetes Other   . Seizures Father    History  Substance Use Topics  . Smoking status: Current Every Day Smoker -- 1.00 packs/day for 15 years    Types: Cigarettes  . Smokeless tobacco: Never Used  . Alcohol Use: No   OB History   Grav Para Term Preterm Abortions TAB SAB Ect Mult Living   2 2  2      2      Review of Systems  Neurological: Positive for seizures.  All other systems reviewed and are negative. A complete 10 system review of systems was obtained and all systems are negative except as noted in the HPI and PMH.    Allergies  Omnicef; Codeine; Sulfa antibiotics; Amoxicillin; and Penicillins  Home Medications   Prior to Admission medications   Medication Sig Start Date End Date Taking? Authorizing Provider  Aspirin-Acetaminophen-Caffeine (GOODYS EXTRA STRENGTH) [phone] MG PACK Take 2 Packages by mouth every 6 (six) hours as needed (Pain).    Historical Provider, MD  dexlansoprazole (DEXILANT) 60 MG capsule Take 60 mg by mouth daily as needed (for acid reflux).     Historical Provider, MD  diazepam (VALIUM) 5 MG tablet Take 5 mg by mouth 3 (three) times daily. FOR SEIZURES 07/17/14  Leroy SeaPrashant K Singh, MD  diphenhydrAMINE (BENADRYL) 25 MG tablet Take 50 mg by mouth every 4 (four) hours as needed for allergies.     Historical Provider, MD  ibuprofen (ADVIL,MOTRIN) 200 MG tablet Take 800 mg by mouth every 6 (six) hours as needed for pain.     Historical Provider, MD  lacosamide (VIMPAT) 200 MG TABS tablet Take 200 mg by mouth 2 (two) times daily.    Historical Provider, MD  lamoTRIgine (LAMICTAL) 25 MG tablet Take 25 mg by mouth 2 (two) times daily.    Historical Provider, MD  naproxen (NAPROSYN) 500 MG tablet Take 1 tablet (500 mg total) by mouth 2 (two) times daily. 08/23/14   Vanetta MuldersScott Zackowski, MD  ondansetron (ZOFRAN) 4 MG tablet Take 1 tablet (4 mg total) by mouth every 8 (eight) hours as needed for nausea or vomiting. 07/17/14   Leroy SeaPrashant K Singh, MD   pramipexole (MIRAPEX) 1 MG tablet Take 1 mg by mouth at bedtime.    Historical Provider, MD  QUEtiapine (SEROQUEL) 50 MG tablet Take 50 mg by mouth at bedtime.    Historical Provider, MD   BP 129/87  Pulse 99  Temp(Src) 99.3 F (37.4 C) (Oral)  Resp 18  Ht 5\' 6"  (1.676 m)  Wt 214 lb (97.07 kg)  BMI 34.56 kg/m2  SpO2 99%  LMP 08/20/2014 Physical Exam  Nursing note and vitals reviewed. Constitutional: She is oriented to person, place, and time. She appears well-developed and well-nourished.  HENT:  Head: Normocephalic and atraumatic.  Eyes: Conjunctivae are normal.  Neck: Normal range of motion. Neck supple.  Cardiovascular: Normal rate, regular rhythm and normal heart sounds.   No murmur heard. Pulmonary/Chest: Effort normal and breath sounds normal. No respiratory distress. She has no wheezes. She has no rales.  Abdominal: Soft. Bowel sounds are normal. She exhibits no distension. There is no tenderness. There is no rebound and no guarding.  Musculoskeletal: Normal range of motion.  Neurological: She is alert and oriented to person, place, and time. No cranial nerve deficit. Coordination normal.  Skin: Skin is warm and dry.  Psychiatric: She has a normal mood and affect.    ED Course  Procedures (including critical care time) Labs Review Labs Reviewed - No data to display  Imaging Review No results found.   EKG Interpretation None     DIAGNOSTIC STUDIES: Oxygen Saturation is 99% on room air, normal by my interpretation.    COORDINATION OF CARE: 5:43 PM Discussed treatment plan with patient at beside, the patient agrees with the plan and has no further questions at this time.    MDM   Final diagnoses:  None    Patient with history of seizure disorder, fibromyalgia, chronic pain. She presents for evaluation of pain in her arms and legs following a seizure that occurred earlier today. She is neurologically intact and vitals are stable. She is not on any  medications which we can check levels on. She is requesting something for her to take for pain. She will be given a small quantity of hydrocodone for her discomfort. She is to followup with her primary Dr. to discuss.  I personally performed the services described in this documentation, which was scribed in my presence. The recorded information has been reviewed and is accurate.      Geoffery Lyonsouglas Kemar Pandit, MD 08/30/14 [phone]

## 2014-08-30 NOTE — ED Notes (Signed)
Pt reports "partial seizure for 6 minutes." pt reports generalized pain. Pt alert and oriented in triage. Pt mildly drowsy. nad noted.

## 2014-09-07 ENCOUNTER — Emergency Department (HOSPITAL_COMMUNITY)
Admission: EM | Admit: 2014-09-07 | Discharge: 2014-09-07 | Disposition: A | Discharge status: Home / Self Care | Payer: Medicaid Other | Attending: Emergency Medicine | Admitting: Emergency Medicine

## 2014-09-07 ENCOUNTER — Encounter (HOSPITAL_COMMUNITY): Payer: Self-pay | Admitting: Emergency Medicine

## 2014-09-07 DIAGNOSIS — Z720 Tobacco use: Secondary | ICD-10-CM | POA: Insufficient documentation

## 2014-09-07 DIAGNOSIS — K219 Gastro-esophageal reflux disease without esophagitis: Secondary | ICD-10-CM | POA: Diagnosis not present

## 2014-09-07 DIAGNOSIS — T07 Unspecified multiple injuries: Secondary | ICD-10-CM | POA: Diagnosis not present

## 2014-09-07 DIAGNOSIS — G43909 Migraine, unspecified, not intractable, without status migrainosus: Secondary | ICD-10-CM | POA: Diagnosis not present

## 2014-09-07 DIAGNOSIS — G2581 Restless legs syndrome: Secondary | ICD-10-CM | POA: Insufficient documentation

## 2014-09-07 DIAGNOSIS — Y9289 Other specified places as the place of occurrence of the external cause: Secondary | ICD-10-CM | POA: Insufficient documentation

## 2014-09-07 DIAGNOSIS — G8929 Other chronic pain: Secondary | ICD-10-CM | POA: Insufficient documentation

## 2014-09-07 DIAGNOSIS — Z8742 Personal history of other diseases of the female genital tract: Secondary | ICD-10-CM | POA: Insufficient documentation

## 2014-09-07 DIAGNOSIS — K589 Irritable bowel syndrome without diarrhea: Secondary | ICD-10-CM | POA: Insufficient documentation

## 2014-09-07 DIAGNOSIS — M797 Fibromyalgia: Secondary | ICD-10-CM | POA: Insufficient documentation

## 2014-09-07 DIAGNOSIS — Y9389 Activity, other specified: Secondary | ICD-10-CM | POA: Diagnosis not present

## 2014-09-07 DIAGNOSIS — S8991XA Unspecified injury of right lower leg, initial encounter: Secondary | ICD-10-CM | POA: Diagnosis not present

## 2014-09-07 DIAGNOSIS — S8992XA Unspecified injury of left lower leg, initial encounter: Secondary | ICD-10-CM | POA: Insufficient documentation

## 2014-09-07 DIAGNOSIS — Z87448 Personal history of other diseases of urinary system: Secondary | ICD-10-CM | POA: Diagnosis not present

## 2014-09-07 DIAGNOSIS — T07XXXA Unspecified multiple injuries, initial encounter: Secondary | ICD-10-CM

## 2014-09-07 DIAGNOSIS — Z79899 Other long term (current) drug therapy: Secondary | ICD-10-CM | POA: Insufficient documentation

## 2014-09-07 DIAGNOSIS — S29002A Unspecified injury of muscle and tendon of back wall of thorax, initial encounter: Secondary | ICD-10-CM | POA: Diagnosis present

## 2014-09-07 DIAGNOSIS — S199XXA Unspecified injury of neck, initial encounter: Secondary | ICD-10-CM | POA: Diagnosis not present

## 2014-09-07 DIAGNOSIS — F419 Anxiety disorder, unspecified: Secondary | ICD-10-CM | POA: Insufficient documentation

## 2014-09-07 DIAGNOSIS — Z880 Allergy status to penicillin: Secondary | ICD-10-CM | POA: Insufficient documentation

## 2014-09-07 DIAGNOSIS — G40909 Epilepsy, unspecified, not intractable, without status epilepticus: Secondary | ICD-10-CM | POA: Insufficient documentation

## 2014-09-07 DIAGNOSIS — Z87442 Personal history of urinary calculi: Secondary | ICD-10-CM | POA: Insufficient documentation

## 2014-09-07 DIAGNOSIS — X58XXXA Exposure to other specified factors, initial encounter: Secondary | ICD-10-CM | POA: Diagnosis not present

## 2014-09-07 MED ORDER — KETOROLAC TROMETHAMINE 10 MG PO TABS
10.0000 mg | ORAL_TABLET | Freq: Once | ORAL | Status: AC
  Administered 2014-09-07: 10 mg via ORAL
  Filled 2014-09-07: qty 1

## 2014-09-07 MED ORDER — DIAZEPAM 5 MG PO TABS
10.0000 mg | ORAL_TABLET | Freq: Once | ORAL | Status: AC
  Administered 2014-09-07: 10 mg via ORAL
  Filled 2014-09-07: qty 2

## 2014-09-07 MED ORDER — METHOCARBAMOL 500 MG PO TABS: ORAL_TABLET | ORAL | Status: DC

## 2014-09-07 MED ORDER — HYDROCODONE-ACETAMINOPHEN 5-325 MG PO TABS
2.0000 | ORAL_TABLET | Freq: Once | ORAL | Status: AC
  Administered 2014-09-07: 2 via ORAL
  Filled 2014-09-07: qty 2

## 2014-09-07 NOTE — ED Notes (Signed)
Patient states she had a seizure last night and is complaining of back, neck, and bilateral leg pain since seizure. Denies injury from seizure. States she was already sitting when seizure started, and fiance lowered her to floor.

## 2014-09-07 NOTE — ED Notes (Signed)
Pt says she had a seizure last night and is having discomfort from the sz activity.  Alert, talking, says she took vicodin without relief.  Visitor with pt was looking in the cabinet when I entered.

## 2014-09-07 NOTE — Discharge Instructions (Signed)
Please continue your current medications. Please add robaxin for muscle spasm. Please rest your back as much as possible. Muscle Strain A muscle strain is an injury that occurs when a muscle is stretched beyond its normal length. Usually a small number of muscle fibers are torn when this happens. Muscle strain is rated in degrees. First-degree strains have the least amount of muscle fiber tearing and pain. Second-degree and third-degree strains have increasingly more tearing and pain.  Usually, recovery from muscle strain takes 1-2 weeks. Complete healing takes 5-6 weeks.  CAUSES  Muscle strain happens when a sudden, violent force placed on a muscle stretches it too far. This may occur with lifting, sports, or a fall.  RISK FACTORS Muscle strain is especially common in athletes.  SIGNS AND SYMPTOMS At the site of the muscle strain, there may be:  Pain.  Bruising.  Swelling.  Difficulty using the muscle due to pain or lack of normal function. DIAGNOSIS  Your health care provider will perform a physical exam and ask about your medical history. TREATMENT  Often, the best treatment for a muscle strain is resting, icing, and applying cold compresses to the injured area.  HOME CARE INSTRUCTIONS   Use the PRICE method of treatment to promote muscle healing during the first 2-3 days after your injury. The PRICE method involves:  Protecting the muscle from being injured again.  Restricting your activity and resting the injured body part.  Icing your injury. To do this, put ice in a plastic bag. Place a towel between your skin and the bag. Then, apply the ice and leave it on from 15-20 minutes each hour. After the third day, switch to moist heat packs.  Apply compression to the injured area with a splint or elastic bandage. Be careful not to wrap it too tightly. This may interfere with blood circulation or increase swelling.  Elevate the injured body part above the level of your heart as  often as you can.  Only take over-the-counter or prescription medicines for pain, discomfort, or fever as directed by your health care provider.  Warming up prior to exercise helps to prevent future muscle strains. SEEK MEDICAL CARE IF:   You have increasing pain or swelling in the injured area.  You have numbness, tingling, or a significant loss of strength in the injured area. MAKE SURE YOU:   Understand these instructions.  Will watch your condition.  Will get help right away if you are not doing well or get worse. Document Released: 11/06/2005 Document Revised: 08/27/2013 Document Reviewed: 06/05/2013 [facility] Patient Information 2015 [place], [place]. This information is not intended to replace advice given to you by your health care provider. Make sure you discuss any questions you have with your health care provider.

## 2014-09-07 NOTE — ED Provider Notes (Signed)
CSN: [id number]     Arrival date & time 09/07/14  1130 History  This chart was scribed for non-physician practitioner Ivery QualeHobson Sheretha Shadd, PA-C, working with Ward GivensIva L Knapp, MD by Littie Deedsichard Sun, ED Scribe. This patient was seen in room APFT22/APFT22 and the patient's care was started at 2:05 PM.   Chief Complaint  Patient presents with  . Back Pain  . Leg Pain      The history is provided by the patient. No language interpreter was used.   HPI Comments: Isabella Buchanan is a 33 y.o. female who presents to the Emergency Department complaining of constant back pain, neck pain and bilateral leg pain that began after the patient had a seizure last night. She tried hydrocodone but without relief to symptoms. Patient was laying down and denies falling down during the seizure.   Past Medical History  Diagnosis Date  . Anxiety   . Fibromyalgia   . Restless leg   . Chronic back pain   . GERD (gastroesophageal reflux disease)   . Headache(784.0)     mygrains  . PONV (postoperative nausea and vomiting)   . Interstitial cystitis   . Polycystic ovarian syndrome   . Hiatal hernia   . Seizures   . IBS (irritable bowel syndrome)   . Chronic pain   . Chronic flank pain   . Kidney stones    Past Surgical History  Procedure Laterality Date  . Cholecystectomy    . Tonsillectomy    . Tubal ligation    . Tympanoplasty    . Esophagogastroduodenoscopy endoscopy     Family History  Problem Relation Age of Onset  . Hypertension Father   . Diabetes Father   . Cancer Other   . Diabetes Other   . Seizures Father    History  Substance Use Topics  . Smoking status: Current Every Day Smoker -- 1.00 packs/day for 15 years    Types: Cigarettes  . Smokeless tobacco: Never Used  . Alcohol Use: No   OB History   Grav Para Term Preterm Abortions TAB SAB Ect Mult Living   2 2  2      2      Review of Systems  Musculoskeletal: Positive for arthralgias, back pain, myalgias and neck pain.  Neurological:  Positive for seizures.  All other systems reviewed and are negative.     Allergies  Omnicef; Codeine; Sulfa antibiotics; Amoxicillin; and Penicillins  Home Medications   Prior to Admission medications   Medication Sig Start Date End Date Taking? Authorizing Provider  Aspirin-Acetaminophen-Caffeine (GOODYS EXTRA STRENGTH) [phone] MG PACK Take 2 Packages by mouth every 6 (six) hours as needed (Pain).   Yes Historical Provider, MD  dexlansoprazole (DEXILANT) 60 MG capsule Take 60 mg by mouth daily as needed (for acid reflux).    Yes Historical Provider, MD  diazepam (VALIUM) 5 MG tablet Take 5 mg by mouth 3 (three) times daily. FOR SEIZURES 07/17/14  Yes Leroy SeaPrashant K Singh, MD  diphenhydrAMINE (BENADRYL) 25 MG tablet Take 50 mg by mouth every 4 (four) hours as needed for allergies.    Yes Historical Provider, MD  HYDROcodone-acetaminophen (NORCO) 10-325 MG per tablet Take 1 tablet by mouth every 6 (six) hours as needed. 08/30/14  Yes Geoffery Lyonsouglas Delo, MD  ibuprofen (ADVIL,MOTRIN) 200 MG tablet Take 800 mg by mouth every 6 (six) hours as needed for pain.    Yes Historical Provider, MD  iron polysaccharides (NIFEREX) 150 MG capsule Take 150 mg by mouth 2 (  two) times daily.   Yes Historical Provider, MD  lacosamide (VIMPAT) 200 MG TABS tablet Take 200 mg by mouth 2 (two) times daily.   Yes Historical Provider, MD  lamoTRIgine (LAMICTAL) 25 MG tablet Take 25 mg by mouth 2 (two) times daily.   Yes Historical Provider, MD  ondansetron (ZOFRAN) 4 MG tablet Take 1 tablet (4 mg total) by mouth every 8 (eight) hours as needed for nausea or vomiting. 07/17/14  Yes Leroy SeaPrashant K Singh, MD  pramipexole (MIRAPEX) 1 MG tablet Take 1 mg by mouth at bedtime.   Yes Historical Provider, MD  QUEtiapine (SEROQUEL) 50 MG tablet Take 50 mg by mouth at bedtime.   Yes Historical Provider, MD  Vitamin D, Ergocalciferol, (DRISDOL) 50000 UNITS CAPS capsule Take 50,000 Units by mouth every 7 (seven) days. Takes on Wednesdays.    Yes Historical Provider, MD   BP 133/86  Pulse 85  Temp(Src) 98.7 F (37.1 C) (Oral)  Resp 14  Ht 5\' 6"  (1.676 m)  Wt 213 lb (96.616 kg)  BMI 34.40 kg/m2  SpO2 100%  LMP 08/20/2014 Physical Exam  Nursing note and vitals reviewed. Constitutional: She is oriented to person, place, and time. She appears well-developed and well-nourished. No distress.  HENT:  Head: Normocephalic and atraumatic.  Mouth/Throat: Oropharynx is clear and moist. No oropharyngeal exudate.  Eyes: Pupils are equal, round, and reactive to light.  Neck: Neck supple.  Cardiovascular: Normal rate, regular rhythm and normal heart sounds.   No murmur heard. Pulmonary/Chest: Effort normal and breath sounds normal. No respiratory distress. She has no wheezes. She has no rales.  Musculoskeletal: She exhibits no edema.  Sore in the trapezius right and left. Paraspinal spasm in the lumbar region. No palpable step off at the lumbar area.  Neurological: She is alert and oriented to person, place, and time. No cranial nerve deficit.  No gross neurological deficits appreciated.  Skin: Skin is warm and dry. No rash noted.  Psychiatric: She has a normal mood and affect. Her behavior is normal.    ED Course  Procedures  DIAGNOSTIC STUDIES: Oxygen Saturation is 100% on RA, nml by my interpretation.    COORDINATION OF CARE: 2:10 PM-Discussed treatment plan which includes medication with pt at bedside and pt agreed to plan.   Labs Review Labs Reviewed - No data to display  Imaging Review No results found.   EKG Interpretation None      MDM  History and examination suggest the patient has a muscle strain as a result of her recent seizure. No gross neurovascular deficits appreciated. No musculoskeletal deformity appreciated. The patient was treated with diazepam, Toradol, and Norco in the department. Prescription for Robaxin given to the patient to add to her current Norco prescription. The patient is also advised to  use ibuprofen or Aleve along with this regimen.    Final diagnoses:  Muscle strain, multiple sites    **I have reviewed nursing notes, vital signs, and all appropriate lab and imaging results for this patient.*  **I personally performed the services described in this documentation, which was scribed in my presence. The recorded information has been reviewed and is accurate.Kathie Dike*   Irma Roulhac M Reginald Weida, PA-C 09/08/14 1304

## 2014-09-08 NOTE — ED Provider Notes (Signed)
Medical screening examination/treatment/procedure(s) were performed by non-physician practitioner and as supervising physician I was immediately available for consultation/collaboration.   EKG Interpretation None      Devoria AlbeIva Mallorey Odonell, MD, Armando GangFACEP   Ward GivensIva L Madalena Kesecker, MD 09/08/14 [phone]

## 2014-09-11 ENCOUNTER — Emergency Department (HOSPITAL_COMMUNITY)
Admission: EM | Admit: 2014-09-11 | Discharge: 2014-09-12 | Disposition: A | Discharge status: Home / Self Care | Payer: Medicaid Other | Attending: Emergency Medicine | Admitting: Emergency Medicine

## 2014-09-11 ENCOUNTER — Encounter (HOSPITAL_COMMUNITY): Payer: Self-pay | Admitting: Emergency Medicine

## 2014-09-11 DIAGNOSIS — Z3202 Encounter for pregnancy test, result negative: Secondary | ICD-10-CM | POA: Diagnosis not present

## 2014-09-11 DIAGNOSIS — Z720 Tobacco use: Secondary | ICD-10-CM | POA: Insufficient documentation

## 2014-09-11 DIAGNOSIS — G8929 Other chronic pain: Secondary | ICD-10-CM | POA: Diagnosis not present

## 2014-09-11 DIAGNOSIS — Z79899 Other long term (current) drug therapy: Secondary | ICD-10-CM | POA: Insufficient documentation

## 2014-09-11 DIAGNOSIS — F419 Anxiety disorder, unspecified: Secondary | ICD-10-CM | POA: Insufficient documentation

## 2014-09-11 DIAGNOSIS — R5383 Other fatigue: Secondary | ICD-10-CM | POA: Insufficient documentation

## 2014-09-11 DIAGNOSIS — Z8719 Personal history of other diseases of the digestive system: Secondary | ICD-10-CM | POA: Insufficient documentation

## 2014-09-11 DIAGNOSIS — Z8639 Personal history of other endocrine, nutritional and metabolic disease: Secondary | ICD-10-CM | POA: Insufficient documentation

## 2014-09-11 DIAGNOSIS — R569 Unspecified convulsions: Secondary | ICD-10-CM | POA: Diagnosis present

## 2014-09-11 DIAGNOSIS — G40909 Epilepsy, unspecified, not intractable, without status epilepticus: Secondary | ICD-10-CM | POA: Diagnosis not present

## 2014-09-11 DIAGNOSIS — R300 Dysuria: Secondary | ICD-10-CM | POA: Insufficient documentation

## 2014-09-11 DIAGNOSIS — Z880 Allergy status to penicillin: Secondary | ICD-10-CM | POA: Diagnosis not present

## 2014-09-11 DIAGNOSIS — Z87442 Personal history of urinary calculi: Secondary | ICD-10-CM | POA: Diagnosis not present

## 2014-09-11 HISTORY — DX: Personal history of other medical treatment: Z92.89

## 2014-09-11 LAB — CBG MONITORING, ED: GLUCOSE-CAPILLARY: 116 mg/dL — AB (ref 70–99)

## 2014-09-11 MED ORDER — ONDANSETRON 8 MG PO TBDP
8.0000 mg | ORAL_TABLET | Freq: Once | ORAL | Status: AC
  Administered 2014-09-11: 8 mg via ORAL
  Filled 2014-09-11: qty 1

## 2014-09-11 MED ORDER — OXYCODONE-ACETAMINOPHEN 5-325 MG PO TABS
1.0000 | ORAL_TABLET | Freq: Once | ORAL | Status: AC
  Administered 2014-09-11: 1 via ORAL
  Filled 2014-09-11: qty 1

## 2014-09-11 MED ORDER — LORAZEPAM 1 MG PO TABS: 1.0000 mg | ORAL_TABLET | Freq: Once | ORAL | Status: DC

## 2014-09-11 MED ORDER — DIAZEPAM 5 MG PO TABS
5.0000 mg | ORAL_TABLET | Freq: Once | ORAL | Status: DC
  Filled 2014-09-11: qty 1

## 2014-09-11 NOTE — ED Notes (Signed)
Pt. Refusing to give urine sample stating "I don't know what that has to do with anything, my urine always shows something". Dr. Bebe ShaggyWickline notified.

## 2014-09-11 NOTE — ED Notes (Signed)
Per EMS, patient's family witnessed 9 minutes of seizure like activity.  Upon EMS arrival, patient A&O and coherent.

## 2014-09-11 NOTE — ED Provider Notes (Signed)
CSN: [id number]     Arrival date & time 09/11/14  2139 History  This chart was scribed for Joya Gaskinsonald W Hiroki Wint, MD by Richarda Overlieichard Holland, ED Scribe. This patient was seen in room APA01/APA01 and the patient's care was started 11:20 PM.    Chief Complaint  Patient presents with  . Seizures    The history is provided by the patient. No language interpreter was used.   HPI Comments: Isabella Buchanan is a 33 y.o. female with a history of seizures who presents to the Emergency Department complaining of seizure that occurred at 8:48PM that lasted for 9 minutes. Boyfriend reports she was confused for about 6 to 7 minutes and incoherent afterwards. Patient reports she has associated generalized body pain currently. She reports her last seizure was about 1 week ago and lasted 15 minutes. She says she has been taking her seizure medication regularly. Denies fevers, vomiting, head injury, HA and slurred speech as symptoms.    Past Medical History  Diagnosis Date  . Anxiety   . Fibromyalgia   . Restless leg   . Chronic back pain   . GERD (gastroesophageal reflux disease)   . Headache(784.0)     mygrains  . PONV (postoperative nausea and vomiting)   . Interstitial cystitis   . Polycystic ovarian syndrome   . Hiatal hernia   . Seizures   . IBS (irritable bowel syndrome)   . Chronic pain   . Chronic flank pain   . Kidney stones   . History of electroencephalogram 11/2013    normal EEG, "psychogenic nonepileptic spell" during EEG   Past Surgical History  Procedure Laterality Date  . Cholecystectomy    . Tonsillectomy    . Tubal ligation    . Tympanoplasty    . Esophagogastroduodenoscopy endoscopy     Family History  Problem Relation Age of Onset  . Hypertension Father   . Diabetes Father   . Cancer Other   . Diabetes Other   . Seizures Father    History  Substance Use Topics  . Smoking status: Current Every Day Smoker -- 1.00 packs/day for 15 years    Types: Cigarettes  . Smokeless  tobacco: Never Used  . Alcohol Use: No   OB History   Grav Para Term Preterm Abortions TAB SAB Ect Mult Living   2 2  2      2      Review of Systems  Constitutional: Positive for fatigue. Negative for fever.  Cardiovascular: Negative for chest pain.  Gastrointestinal: Negative for vomiting.  Genitourinary: Positive for dysuria.  Musculoskeletal: Positive for myalgias.  Neurological: Positive for seizures. Negative for speech difficulty.  All other systems reviewed and are negative.     Allergies  Omnicef; Codeine; Sulfa antibiotics; Amoxicillin; and Penicillins  Home Medications   Prior to Admission medications   Medication Sig Start Date End Date Taking? Authorizing Provider  Aspirin-Acetaminophen-Caffeine (GOODYS EXTRA STRENGTH) [phone] MG PACK Take 2 Packages by mouth every 6 (six) hours as needed (Pain).   Yes Historical Provider, MD  dexlansoprazole (DEXILANT) 60 MG capsule Take 60 mg by mouth daily as needed (for acid reflux).    Yes Historical Provider, MD  diazepam (VALIUM) 5 MG tablet Take 5 mg by mouth 3 (three) times daily. FOR SEIZURES 07/17/14  Yes Leroy SeaPrashant K Singh, MD  diphenhydrAMINE (BENADRYL) 25 MG tablet Take 50 mg by mouth every 4 (four) hours as needed for allergies.    Yes Historical Provider, MD  HYDROcodone-acetaminophen [facility](NORCO)  10-325 MG per tablet Take 1 tablet by mouth every 6 (six) hours as needed. 08/30/14  Yes Geoffery Lyons, MD  ibuprofen (ADVIL,MOTRIN) 200 MG tablet Take 800 mg by mouth every 6 (six) hours as needed for pain.    Yes Historical Provider, MD  iron polysaccharides (NIFEREX) 150 MG capsule Take 150 mg by mouth 2 (two) times daily.   Yes Historical Provider, MD  lacosamide (VIMPAT) 200 MG TABS tablet Take 200 mg by mouth 2 (two) times daily.   Yes Historical Provider, MD  lamoTRIgine (LAMICTAL) 25 MG tablet Take 25 mg by mouth 2 (two) times daily.   Yes Historical Provider, MD  methocarbamol (ROBAXIN) 500 MG tablet 2 po tid for spasm  09/07/14  Yes Kathie Dike, PA-C  ondansetron (ZOFRAN) 4 MG tablet Take 1 tablet (4 mg total) by mouth every 8 (eight) hours as needed for nausea or vomiting. 07/17/14  Yes Leroy Sea, MD  pramipexole (MIRAPEX) 1 MG tablet Take 1 mg by mouth at bedtime.   Yes Historical Provider, MD  QUEtiapine (SEROQUEL) 50 MG tablet Take 50 mg by mouth at bedtime.   Yes Historical Provider, MD  Vitamin D, Ergocalciferol, (DRISDOL) 50000 UNITS CAPS capsule Take 50,000 Units by mouth every 7 (seven) days. Takes on Wednesdays.   Yes Historical Provider, MD   BP 142/87  Pulse 92  Temp(Src) 98.4 F (36.9 C) (Oral)  Resp 18  Ht 5\' 6"  (1.676 m)  Wt 213 lb (96.616 kg)  BMI 34.40 kg/m2  SpO2 96%  LMP 08/20/2014 Physical Exam CONSTITUTIONAL: Well developed/well nourished HEAD: Normocephalic/atraumatic EYES: EOMI/PERRL ENMT: Mucous membranes moist NECK: supple no meningeal signs SPINE:entire spine nontender CV: S1/S2 noted, no murmurs/rubs/gallops noted LUNGS: Lungs are clear to auscultation bilaterally, no apparent distress ABDOMEN: soft, nontender, no rebound or guarding GU:no cva tenderness NEURO: Pt is awake/alert, moves all extremitiesx4. No arm or leg drift, no facial droop.  EXTREMITIES: pulses normal, full ROM SKIN: warm, color normal PSYCH: no abnormalities of mood noted  ED Course  Procedures  DIAGNOSTIC STUDIES: Oxygen Saturation is 96% on RA, normal by my interpretation.    COORDINATION OF CARE: 12:12 AM Discussed treatment plan with pt at bedside and pt agreed to plan. Pt refusing urinalysis studies She refused ativan/valium She requested pain meds only She has had extensive SZ workup in past She reports med compliance She reports f/u with new neurologist next week    After stay in ED, pt elected to give urine sample Pt ambulatory Advised to continue meds and f/u with neurology next week as planned She has no signs of acute neurologic emergency at this time Discussed  return precautions We discussed seizure precautions (no driving, no bathing/swimming alone)  Labs Review Labs Reviewed  CBG MONITORING, ED - Abnormal; Notable for the following:    Glucose-Capillary 116 (*)    All other components within normal limits  URINALYSIS, ROUTINE W REFLEX MICROSCOPIC  POC URINE PREG, ED     Date: 09/11/2014 2337  Rate: 79  Rhythm: normal sinus rhythm  QRS Axis: normal  Intervals: normal  ST/T Wave abnormalities: normal  Conduction Disutrbances:none    MDM   Final diagnoses:  None     Nursing notes including past medical history and social history reviewed and considered in documentation Labs/vital reviewed and considered  I personally performed the services described in this documentation, which was scribed in my presence. The recorded information has been reviewed and is accurate.      Dorinda Hill  Forestine ChuteW Oneida Mckamey, MD 09/12/14 [phone]

## 2014-09-12 LAB — URINALYSIS, ROUTINE W REFLEX MICROSCOPIC
Bilirubin Urine: NEGATIVE
GLUCOSE, UA: NEGATIVE mg/dL
Hgb urine dipstick: NEGATIVE
Ketones, ur: NEGATIVE mg/dL
Nitrite: NEGATIVE
PROTEIN: NEGATIVE mg/dL
Specific Gravity, Urine: 1.015 (ref 1.005–1.030)
UROBILINOGEN UA: 0.2 mg/dL (ref 0.0–1.0)
pH: 6 (ref 5.0–8.0)

## 2014-09-12 LAB — URINE MICROSCOPIC-ADD ON

## 2014-09-12 LAB — POC URINE PREG, ED: PREG TEST UR: NEGATIVE

## 2014-09-12 NOTE — Discharge Instructions (Signed)
Please be aware you may have another seizure  Do not drive until seen by your physician for your condition  Do not climb ladders/roofs/trees as a seizure can occur at that height and cause serious harm  Do not bathe/swim alone as a seizure can occur and cause serious harm  Please followup with your physician or neurologist for further testing and possible treatment   Seizure, Adult A seizure is abnormal electrical activity in the brain. Seizures usually last from 30 seconds to 2 minutes. There are various types of seizures. Before a seizure, you may have a warning sensation (aura) that a seizure is about to occur. An aura may include the following symptoms:   Fear or anxiety.  Nausea.  Feeling like the room is spinning (vertigo).  Vision changes, such as seeing flashing lights or spots. Common symptoms during a seizure include:  A change in attention or behavior (altered mental status).  Convulsions with rhythmic jerking movements.  Drooling.  Rapid eye movements.  Grunting.  Loss of bladder and bowel control.  Bitter taste in the mouth.  Tongue biting. After a seizure, you may feel confused and sleepy. You may also have an injury resulting from convulsions during the seizure. HOME CARE INSTRUCTIONS   If you are given medicines, take them exactly as prescribed by your health care provider.  Keep all follow-up appointments as directed by your health care provider.  Do not swim or drive or engage in risky activity during which a seizure could cause further injury to you or others until your health care provider says it is OK.  Get adequate rest.  Teach friends and family what to do if you have a seizure. They should:  Lay you on the ground to prevent a fall.  Put a cushion under your head.  Loosen any tight clothing around your neck.  Turn you on your side. If vomiting occurs, this helps keep your airway clear.  Stay with you until you recover.  Know  whether or not you need emergency care. SEEK IMMEDIATE MEDICAL CARE IF:  The seizure lasts longer than 5 minutes.  The seizure is severe or you do not wake up immediately after the seizure.  You have an altered mental status after the seizure.  You are having more frequent or worsening seizures. Someone should drive you to the emergency department or call local emergency services (911 in [place] MAKE SURE YOU:  Understand these instructions.  Will watch your condition.  Will get help right away if you are not doing well or get worse. Document Released: 11/03/2000 Document Revised: 08/27/2013 Document Reviewed: 06/18/2013 [facility] Patient Information 2015 [employer], [place]. This information is not intended to replace advice given to you by your health care provider. Make sure you discuss any questions you have with your health care provider.

## 2014-09-13 ENCOUNTER — Emergency Department (HOSPITAL_COMMUNITY)
Admission: EM | Admit: 2014-09-13 | Discharge: 2014-09-14 | Discharge status: Against Medical Advice | Payer: Medicaid Other | Attending: Emergency Medicine | Admitting: Emergency Medicine

## 2014-09-13 ENCOUNTER — Encounter (HOSPITAL_COMMUNITY): Payer: Self-pay | Admitting: Emergency Medicine

## 2014-09-13 DIAGNOSIS — G40909 Epilepsy, unspecified, not intractable, without status epilepticus: Secondary | ICD-10-CM | POA: Insufficient documentation

## 2014-09-13 DIAGNOSIS — G2581 Restless legs syndrome: Secondary | ICD-10-CM | POA: Insufficient documentation

## 2014-09-13 DIAGNOSIS — R569 Unspecified convulsions: Secondary | ICD-10-CM

## 2014-09-13 DIAGNOSIS — Z720 Tobacco use: Secondary | ICD-10-CM | POA: Diagnosis not present

## 2014-09-13 DIAGNOSIS — M797 Fibromyalgia: Secondary | ICD-10-CM | POA: Insufficient documentation

## 2014-09-13 DIAGNOSIS — F419 Anxiety disorder, unspecified: Secondary | ICD-10-CM | POA: Insufficient documentation

## 2014-09-13 DIAGNOSIS — Z87442 Personal history of urinary calculi: Secondary | ICD-10-CM | POA: Diagnosis not present

## 2014-09-13 DIAGNOSIS — Z880 Allergy status to penicillin: Secondary | ICD-10-CM | POA: Insufficient documentation

## 2014-09-13 DIAGNOSIS — K219 Gastro-esophageal reflux disease without esophagitis: Secondary | ICD-10-CM | POA: Diagnosis not present

## 2014-09-13 DIAGNOSIS — G8929 Other chronic pain: Secondary | ICD-10-CM | POA: Insufficient documentation

## 2014-09-13 DIAGNOSIS — Z79899 Other long term (current) drug therapy: Secondary | ICD-10-CM | POA: Diagnosis not present

## 2014-09-13 NOTE — ED Notes (Signed)
Patient placed in gown and placed on cardiac monitor. EKG done and given to EDP. Family at bedside.

## 2014-09-13 NOTE — ED Provider Notes (Signed)
CSN: [id number]     Arrival date & time 09/13/14  2303 History  This chart was scribed for Isabella Gaskins, MD by Richarda Overlie, ED Scribe. This patient was seen in room APA14/APA14 and the patient's care was started 11:54 PM.    Chief Complaint  Patient presents with  . Seizures    The history is provided by the patient. No language interpreter was used.   HPI Comments: Isabella Buchanan is a 33 y.o. female with a history of seizures and fibromyalgia who presents to the Emergency Department complaining of seizures that occurred PTA. Boyfriend reports pt seized for approximately 15 minutes.  The seizure terminated spontaneously.  Pt reports associated generalized body pain as a symptom. Pt was here 2 days ago for a seizure. She denies CP and any new weakness as symptoms. No HA is reported.  No focal weakness.  She reports no modifying factors at this time.    Past Medical History  Diagnosis Date  . Anxiety   . Fibromyalgia   . Restless leg   . Chronic back pain   . GERD (gastroesophageal reflux disease)   . Headache(784.0)     mygrains  . PONV (postoperative nausea and vomiting)   . Interstitial cystitis   . Polycystic ovarian syndrome   . Hiatal hernia   . Seizures   . IBS (irritable bowel syndrome)   . Chronic pain   . Chronic flank pain   . Kidney stones   . History of electroencephalogram 11/2013    normal EEG, "psychogenic nonepileptic spell" during EEG   Past Surgical History  Procedure Laterality Date  . Cholecystectomy    . Tonsillectomy    . Tubal ligation    . Tympanoplasty    . Esophagogastroduodenoscopy endoscopy     Family History  Problem Relation Age of Onset  . Hypertension Father   . Diabetes Father   . Cancer Other   . Diabetes Other   . Seizures Father    History  Substance Use Topics  . Smoking status: Current Every Day Smoker -- 1.00 packs/day for 15 years    Types: Cigarettes  . Smokeless tobacco: Never Used  . Alcohol Use: No   OB  History   Grav Para Term Preterm Abortions TAB SAB Ect Mult Living   2 2  2      2      Review of Systems  Constitutional: Negative for fever.  Cardiovascular: Negative for chest pain.  Gastrointestinal: Negative for vomiting.  Neurological: Positive for seizures. Negative for weakness and headaches.  All other systems reviewed and are negative.     Allergies  Omnicef; Codeine; Sulfa antibiotics; Amoxicillin; and Penicillins  Home Medications   Prior to Admission medications   Medication Sig Start Date End Date Taking? Authorizing Provider  Aspirin-Acetaminophen-Caffeine (GOODYS EXTRA STRENGTH) [phone] MG PACK Take 2 Packages by mouth every 6 (six) hours as needed (Pain).    Historical Provider, MD  dexlansoprazole (DEXILANT) 60 MG capsule Take 60 mg by mouth daily as needed (for acid reflux).     Historical Provider, MD  diazepam (VALIUM) 5 MG tablet Take 5 mg by mouth 3 (three) times daily. FOR SEIZURES 07/17/14   Leroy Sea, MD  diphenhydrAMINE (BENADRYL) 25 MG tablet Take 50 mg by mouth every 4 (four) hours as needed for allergies.     Historical Provider, MD  HYDROcodone-acetaminophen (NORCO) 10-325 MG per tablet Take 1 tablet by mouth every 6 (six) hours as needed. 08/30/14  Geoffery Lyonsouglas Delo, MD  ibuprofen (ADVIL,MOTRIN) 200 MG tablet Take 800 mg by mouth every 6 (six) hours as needed for pain.     Historical Provider, MD  iron polysaccharides (NIFEREX) 150 MG capsule Take 150 mg by mouth 2 (two) times daily.    Historical Provider, MD  lacosamide (VIMPAT) 200 MG TABS tablet Take 200 mg by mouth 2 (two) times daily.    Historical Provider, MD  lamoTRIgine (LAMICTAL) 25 MG tablet Take 25 mg by mouth 2 (two) times daily.    Historical Provider, MD  methocarbamol (ROBAXIN) 500 MG tablet 2 po tid for spasm 09/07/14   Kathie DikeHobson M Bryant, PA-C  ondansetron (ZOFRAN) 4 MG tablet Take 1 tablet (4 mg total) by mouth every 8 (eight) hours as needed for nausea or vomiting. 07/17/14    Leroy SeaPrashant K Singh, MD  pramipexole (MIRAPEX) 1 MG tablet Take 1 mg by mouth at bedtime.    Historical Provider, MD  QUEtiapine (SEROQUEL) 50 MG tablet Take 50 mg by mouth at bedtime.    Historical Provider, MD  Vitamin D, Ergocalciferol, (DRISDOL) 50000 UNITS CAPS capsule Take 50,000 Units by mouth every 7 (seven) days. Takes on Wednesdays.    Historical Provider, MD   BP 130/98  Pulse 84  Temp(Src) 98.7 F (37.1 C) (Oral)  Resp 14  Ht 5\' 6"  (1.676 m)  Wt 213 lb (96.616 kg)  BMI 34.40 kg/m2  SpO2 97%  LMP 08/20/2014 Physical Exam  Nursing note and vitals reviewed.   CONSTITUTIONAL: Well developed/well nourished HEAD: Normocephalic/atraumatic EYES: EOMI/PERRL ENMT: Mucous membranes moist NECK: supple no meningeal signs SPINE:entire spine nontender CV: S1/S2 noted, no murmurs/rubs/gallops noted LUNGS: Lungs are clear to auscultation bilaterally, no apparent distress ABDOMEN: soft, nontender, no rebound or guarding GU:no cva tenderness NEURO: Pt is awake/alert, moves all extremitiesx4. No arm or leg drift. No facial droop. Speech is fluent.  EXTREMITIES: pulses normal, full ROM, no tenderness noted extremities  SKIN: warm, color normal PSYCH: no abnormalities of mood noted  ED Course  Procedures  DIAGNOSTIC STUDIES: Oxygen Saturation is 97% on RA, normal by my interpretation.    COORDINATION OF CARE: 11:57 PM Discussed treatment plan with pt at bedside and pt agreed to plan.  PATIENT WITH REPEAT ER VISIT (LAST VISIT ON 09/11/14) FOR SEIZURE SHE REPORTS MED COMPLIANCE INCLUDING TAKING VALIUM REGULARLY I ADVISED NEED FOR LABORATORY EVALUATION (INCLUDING CK) AND LIKELY NEURO CONSULTATION DUE TO INCREASE FREQUENCY OF SEIZURES PT REQUESTED DISCHARGE HOME WHEN I DID NOT ORDER HER PAIN MEDICATION IMMEDIATELY ADVISED RISK FOR DEATH/DISABILITY IF SHE LEFT PRIOR TO COMPLETE EVALUATION SHE IS AWAKE/ALERT AND NOT IN A POSTICTAL STATE AND DOES NOT APPEAR CONFUSED  I discussed risk of  death/disability of leaving against medical advice and the patient accepts these risks.  The patient is awake/alert able to make decisions, and does not appear intoxicated Patient discharged against medical advice.     EKG Interpretation   Date/Time:  Sunday September 13 2014 23:12:39 EDT Ventricular Rate:  86 PR Interval:  155 QRS Duration: 74 QT Interval:  351 QTC Calculation: 420 R Axis:   32 Text Interpretation:  Sinus rhythm Non-specific ST-t changes No previous  ECGs available Confirmed by Bebe ShaggyWICKLINE  MD, Dorinda HillNALD ([id number]) on 09/13/2014  11:36:20 PM      MDM   Final diagnoses:  Seizure    Nursing notes including past medical history and social history reviewed and considered in documentation   I personally performed the services described in this documentation, which was  scribed in my presence. The recorded information has been reviewed and is accurate.      Isabella Gaskinsonald W Shakiyla Kook, MD 09/14/14 [phone]

## 2014-09-13 NOTE — ED Notes (Signed)
Pt brought in by [facility] for c/o seizure. Pt's husband said pt had a seizure for approximately 15 mins; pt would respond with ammonia inhalent on scene; pt is alert and oriented

## 2014-09-14 NOTE — Discharge Instructions (Signed)
Please be aware you may have another seizure ° °Do not drive until seen by your physician for your condition ° °Do not climb ladders/roofs/trees as a seizure can occur at that height and cause serious harm ° °Do not bathe/swim alone as a seizure can occur and cause serious harm ° °Please followup with your physician or neurologist for further testing and possible treatment ° ° °

## 2014-09-14 NOTE — ED Notes (Signed)
Pt refused to have blood drawn, and wants to be discharge, EDP Wickline informed and speaking to pt.

## 2014-09-20 ENCOUNTER — Emergency Department (HOSPITAL_COMMUNITY)
Admission: EM | Admit: 2014-09-20 | Discharge: 2014-09-20 | Disposition: A | Discharge status: Home / Self Care | Payer: Medicaid Other | Attending: Emergency Medicine | Admitting: Emergency Medicine

## 2014-09-20 ENCOUNTER — Encounter (HOSPITAL_COMMUNITY): Payer: Self-pay | Admitting: Cardiology

## 2014-09-20 ENCOUNTER — Emergency Department (HOSPITAL_COMMUNITY): Payer: Medicaid Other

## 2014-09-20 DIAGNOSIS — G8929 Other chronic pain: Secondary | ICD-10-CM | POA: Insufficient documentation

## 2014-09-20 DIAGNOSIS — Z87442 Personal history of urinary calculi: Secondary | ICD-10-CM | POA: Insufficient documentation

## 2014-09-20 DIAGNOSIS — R109 Unspecified abdominal pain: Secondary | ICD-10-CM | POA: Insufficient documentation

## 2014-09-20 DIAGNOSIS — Z9889 Other specified postprocedural states: Secondary | ICD-10-CM | POA: Insufficient documentation

## 2014-09-20 DIAGNOSIS — G2581 Restless legs syndrome: Secondary | ICD-10-CM | POA: Diagnosis not present

## 2014-09-20 DIAGNOSIS — G40909 Epilepsy, unspecified, not intractable, without status epilepticus: Secondary | ICD-10-CM | POA: Diagnosis not present

## 2014-09-20 DIAGNOSIS — Z720 Tobacco use: Secondary | ICD-10-CM | POA: Diagnosis not present

## 2014-09-20 DIAGNOSIS — M797 Fibromyalgia: Secondary | ICD-10-CM | POA: Diagnosis not present

## 2014-09-20 DIAGNOSIS — Z9851 Tubal ligation status: Secondary | ICD-10-CM | POA: Diagnosis not present

## 2014-09-20 DIAGNOSIS — Z7982 Long term (current) use of aspirin: Secondary | ICD-10-CM | POA: Insufficient documentation

## 2014-09-20 DIAGNOSIS — Z880 Allergy status to penicillin: Secondary | ICD-10-CM | POA: Insufficient documentation

## 2014-09-20 DIAGNOSIS — K219 Gastro-esophageal reflux disease without esophagitis: Secondary | ICD-10-CM | POA: Insufficient documentation

## 2014-09-20 DIAGNOSIS — F419 Anxiety disorder, unspecified: Secondary | ICD-10-CM | POA: Diagnosis not present

## 2014-09-20 DIAGNOSIS — Z3202 Encounter for pregnancy test, result negative: Secondary | ICD-10-CM | POA: Insufficient documentation

## 2014-09-20 DIAGNOSIS — Z79899 Other long term (current) drug therapy: Secondary | ICD-10-CM | POA: Insufficient documentation

## 2014-09-20 DIAGNOSIS — M5136 Other intervertebral disc degeneration, lumbar region: Secondary | ICD-10-CM | POA: Insufficient documentation

## 2014-09-20 DIAGNOSIS — Z9089 Acquired absence of other organs: Secondary | ICD-10-CM | POA: Insufficient documentation

## 2014-09-20 LAB — URINALYSIS, ROUTINE W REFLEX MICROSCOPIC
BILIRUBIN URINE: NEGATIVE
Glucose, UA: NEGATIVE mg/dL
HGB URINE DIPSTICK: NEGATIVE
Ketones, ur: NEGATIVE mg/dL
Leukocytes, UA: NEGATIVE
Nitrite: NEGATIVE
PH: 5.5 (ref 5.0–8.0)
Protein, ur: NEGATIVE mg/dL
SPECIFIC GRAVITY, URINE: 1.025 (ref 1.005–1.030)
Urobilinogen, UA: 0.2 mg/dL (ref 0.0–1.0)

## 2014-09-20 LAB — BASIC METABOLIC PANEL
Anion gap: 15 (ref 5–15)
BUN: 11 mg/dL (ref 6–23)
CHLORIDE: 103 meq/L (ref 96–112)
CO2: 22 mEq/L (ref 19–32)
Calcium: 9.3 mg/dL (ref 8.4–10.5)
Creatinine, Ser: 0.74 mg/dL (ref 0.50–1.10)
GFR calc Af Amer: >90 mL/min (ref >90)
GFR calc non Af Amer: >90 mL/min (ref >90)
GLUCOSE: 105 mg/dL — AB (ref 70–99)
POTASSIUM: 4 meq/L (ref 3.7–5.3)
SODIUM: 140 meq/L (ref 137–147)

## 2014-09-20 LAB — PREGNANCY, URINE: Preg Test, Ur: NEGATIVE

## 2014-09-20 MED ORDER — ONDANSETRON HCL 4 MG/2ML IJ SOLN
4.0000 mg | Freq: Once | INTRAMUSCULAR | Status: AC
  Administered 2014-09-20: 4 mg via INTRAVENOUS
  Filled 2014-09-20: qty 2

## 2014-09-20 MED ORDER — ONDANSETRON HCL 4 MG/2ML IJ SOLN: 4.0000 mg | Freq: Once | INTRAMUSCULAR | Status: DC

## 2014-09-20 MED ORDER — HYDROCODONE-ACETAMINOPHEN 5-325 MG PO TABS
2.0000 | ORAL_TABLET | Freq: Once | ORAL | Status: AC
  Administered 2014-09-20: 2 via ORAL
  Filled 2014-09-20: qty 2

## 2014-09-20 MED ORDER — HYDROCODONE-ACETAMINOPHEN 5-325 MG PO TABS: 1.0000 | ORAL_TABLET | ORAL | Status: DC | PRN

## 2014-09-20 MED ORDER — METHOCARBAMOL 500 MG PO TABS: 500.0000 mg | ORAL_TABLET | Freq: Three times a day (TID) | ORAL | Status: DC

## 2014-09-20 MED ORDER — MORPHINE SULFATE 4 MG/ML IJ SOLN
4.0000 mg | Freq: Once | INTRAMUSCULAR | Status: AC
  Administered 2014-09-20: 4 mg via INTRAVENOUS
  Filled 2014-09-20: qty 1

## 2014-09-20 MED ORDER — DIAZEPAM 5 MG PO TABS
5.0000 mg | ORAL_TABLET | Freq: Once | ORAL | Status: AC
  Administered 2014-09-20: 5 mg via ORAL
  Filled 2014-09-20: qty 1

## 2014-09-20 MED ORDER — DEXAMETHASONE 6 MG PO TABS: ORAL_TABLET | ORAL | Status: DC

## 2014-09-20 NOTE — ED Notes (Signed)
Left flank pain times 30 min.

## 2014-09-20 NOTE — Discharge Instructions (Signed)
Your  Urine test  And chemistries are negative for acute event. Your CT scan reveals a tiny stone in the left kidney only. No acute problem noted. 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 Please discuss this with your Medicaid MD for evaluation and management. Degenerative Disk Disease Degenerative disk disease is a condition caused by the changes that occur in the cushions of the backbone (spinal disks) as you grow older. Spinal disks are soft and compressible disks located between the bones of the spine (vertebrae). They act like shock absorbers. Degenerative disk disease can affect the whole spine. However, the neck and lower back are most commonly affected. Many changes can occur in the spinal disks with aging, such as:  The spinal disks may dry and shrink.  Small tears may occur in the tough, outer covering of the disk (annulus).  The disk space may become smaller due to loss of water.  Abnormal growths in the bone (spurs) may occur. This can put pressure on the nerve roots exiting the spinal canal, causing pain.  The spinal canal may become narrowed. CAUSES  Degenerative disk disease is a condition caused by the changes that occur in the spinal disks with aging. The exact cause is not known, but there is a genetic basis for many patients. Degenerative changes can occur due to loss of fluid in the disk. This makes the disk thinner and reduces the space between the backbones. Small cracks can develop in the outer layer of the disk. This can lead to the breakdown of the disk. You are more likely to get degenerative disk disease if you are overweight. Smoking cigarettes and doing heavy work such as weightlifting can also increase your risk of this condition. Degenerative changes can start after a sudden injury. Growth of bone spurs can compress the nerve roots and cause pain.  SYMPTOMS  The symptoms vary from person to person. Some people  may have no pain, while others have severe pain. The pain may be so severe that it can limit your activities. The location of the pain depends on the part of your backbone that is affected. You will have neck or arm pain if a disk in the neck area is affected. You will have pain in your back, buttocks, or legs if a disk in the lower back is affected. The pain becomes worse while bending, reaching up, or with twisting movements. The pain may start gradually and then get worse as time passes. It may also start after a major or minor injury. You may feel numbness or tingling in the arms or legs.  DIAGNOSIS  Your caregiver will ask you about your symptoms and about activities or habits that may cause the pain. He or she may also ask about any injuries, diseases, or treatments you have had earlier. Your caregiver will examine you to check for the range of movement that is possible in the affected area, to check for strength in your extremities, and to check for sensation in the areas of the arms and legs supplied by different nerve roots. An X-ray of the spine may be taken. Your caregiver may suggest other imaging tests, such as magnetic resonance imaging (MRI), if needed.  TREATMENT  Treatment includes rest, modifying your activities, and applying ice and heat. Your caregiver may prescribe medicines to reduce your pain and may ask you to do some exercises to strengthen your back. In some cases, you may  need surgery. You and your caregiver will decide on the treatment that is best for you. HOME CARE INSTRUCTIONS   Follow proper lifting and walking techniques as advised by your caregiver.  Maintain good posture.  Exercise regularly as advised.  Perform relaxation exercises.  Change your sitting, standing, and sleeping habits as advised. Change positions frequently.  Lose weight as advised.  Stop smoking if you smoke.  Wear supportive footwear. SEEK MEDICAL CARE IF:  Your pain does not go away within  1 to 4 weeks. SEEK IMMEDIATE MEDICAL CARE IF:   Your pain is severe.  You notice weakness in your arms, hands, or legs.  You begin to lose control of your bladder or bowel movements. MAKE SURE YOU:   Understand these instructions.  Will watch your condition.  Will get help right away if you are not doing well or get worse. Document Released: 09/03/2007 Document Revised: 01/29/2012 Document Reviewed: 03/10/2014 [facility] Patient Information 2015 [employer], [place]. This information is not intended to replace advice given to you by your health care provider. Make sure you discuss any questions you have with your health care provider.  Flank Pain Flank pain is pain in your side. The flank is the area of your side between your upper belly (abdomen) and your back. Pain in this area can be caused by many different things. HOME CARE Home care and treatment will depend on the cause of your pain.  Rest as told by your doctor.  Drink enough fluids to keep your pee (urine) clear or pale yellow.  Only take medicine as told by your doctor.  Tell your doctor about any changes in your pain.  Follow up with your doctor. GET HELP RIGHT AWAY IF:   Your pain does not get better with medicine.   You have new symptoms or your symptoms get worse.  Your pain gets worse.   You have belly (abdominal) pain.   You are short of breath.   You always feel sick to your stomach (nauseous).   You keep throwing up (vomiting).   You have puffiness (swelling) in your belly.   You feel light-headed or you pass out (faint).   You have blood in your pee.  You have a fever or lasting symptoms for more than 2-3 days.  You have a fever and your symptoms suddenly get worse. MAKE SURE YOU:   Understand these instructions.  Will watch your condition.  Will get help right away if you are not doing well or get worse. Document Released: 08/15/2008 Document Revised: 03/23/2014 Document Reviewed:  06/20/2012 [facility] Patient Information 2015 [employer], [place]. This information is not intended to replace advice given to you by your health care provider. Make sure you discuss any questions you have with your health care provider.

## 2014-09-20 NOTE — ED Provider Notes (Signed)
CSN: [id number]     Arrival date & time 09/20/14  0906 History   First MD Initiated Contact with Patient 09/20/14 0913     Chief Complaint  Patient presents with  . Flank Pain     (Consider location/radiation/quality/duration/timing/severity/associated sxs/prior Treatment) HPI Comments: Sitting on the bed 30min ago with left flank pain started. No vomiting but nausea. No blood in stool or urine. No high fever. No injury. Hx of kidney stones. Pt has not taken anything for the discomfort. Nothing helps the pain at this point.  Patient is a 33 y.o. female presenting with flank pain. The history is provided by the patient.  Flank Pain This is a recurrent problem. The current episode started today. The problem occurs constantly. The problem has been gradually worsening. Associated symptoms include abdominal pain. Pertinent negatives include no arthralgias, chest pain, coughing or neck pain.    Past Medical History  Diagnosis Date  . Anxiety   . Fibromyalgia   . Restless leg   . Chronic back pain   . GERD (gastroesophageal reflux disease)   . Headache(784.0)     mygrains  . PONV (postoperative nausea and vomiting)   . Interstitial cystitis   . Polycystic ovarian syndrome   . Hiatal hernia   . Seizures   . IBS (irritable bowel syndrome)   . Chronic pain   . Chronic flank pain   . Kidney stones   . History of electroencephalogram 11/2013    normal EEG, "psychogenic nonepileptic spell" during EEG   Past Surgical History  Procedure Laterality Date  . Cholecystectomy    . Tonsillectomy    . Tubal ligation    . Tympanoplasty    . Esophagogastroduodenoscopy endoscopy     Family History  Problem Relation Age of Onset  . Hypertension Father   . Diabetes Father   . Cancer Other   . Diabetes Other   . Seizures Father    History  Substance Use Topics  . Smoking status: Current Every Day Smoker -- 1.00 packs/day for 15 years    Types: Cigarettes  . Smokeless tobacco: Never Used   . Alcohol Use: No   OB History    Gravida Para Term Preterm AB TAB SAB Ectopic Multiple Living   2 2  2      2      Review of Systems  Constitutional: Negative for activity change.       All ROS Neg except as noted in HPI  HENT: Negative for nosebleeds.   Eyes: Negative for photophobia and discharge.  Respiratory: Negative for cough, shortness of breath and wheezing.   Cardiovascular: Negative for chest pain and palpitations.  Gastrointestinal: Positive for abdominal pain. Negative for blood in stool.  Genitourinary: Positive for flank pain. Negative for dysuria, frequency and hematuria.  Musculoskeletal: Positive for back pain. Negative for arthralgias and neck pain.  Skin: Negative.   Neurological: Positive for seizures. Negative for dizziness and speech difficulty.  Psychiatric/Behavioral: Negative for hallucinations and confusion. The patient is nervous/anxious.       Allergies  Omnicef; Codeine; Sulfa antibiotics; Amoxicillin; and Penicillins  Home Medications   Prior to Admission medications   Medication Sig Start Date End Date Taking? Authorizing Provider  Aspirin-Acetaminophen-Caffeine (GOODYS EXTRA STRENGTH) [id number] MG PACK Take 2 Packages by mouth every 6 (six) hours as needed (Pain).    Historical Provider, MD  dexlansoprazole (DEXILANT) 60 MG capsule Take 60 mg by mouth daily as needed (for acid reflux).  Historical Provider, MD  diazepam (VALIUM) 5 MG tablet Take 5 mg by mouth 3 (three) times daily. FOR SEIZURES 07/17/14   Leroy Sea, MD  diphenhydrAMINE (BENADRYL) 25 MG tablet Take 50 mg by mouth every 4 (four) hours as needed for allergies.     Historical Provider, MD  HYDROcodone-acetaminophen (NORCO) 10-325 MG per tablet Take 1 tablet by mouth every 6 (six) hours as needed. 08/30/14   Geoffery Lyons, MD  ibuprofen (ADVIL,MOTRIN) 200 MG tablet Take 800 mg by mouth every 6 (six) hours as needed for pain.     Historical Provider, MD  iron polysaccharides  (NIFEREX) 150 MG capsule Take 150 mg by mouth 2 (two) times daily.    Historical Provider, MD  lacosamide (VIMPAT) 200 MG TABS tablet Take 200 mg by mouth 2 (two) times daily.    Historical Provider, MD  lamoTRIgine (LAMICTAL) 25 MG tablet Take 25 mg by mouth 2 (two) times daily.    Historical Provider, MD  methocarbamol (ROBAXIN) 500 MG tablet 2 po tid for spasm 09/07/14   Kathie Dike, PA-C  ondansetron (ZOFRAN) 4 MG tablet Take 1 tablet (4 mg total) by mouth every 8 (eight) hours as needed for nausea or vomiting. 07/17/14   Leroy Sea, MD  pramipexole (MIRAPEX) 1 MG tablet Take 1 mg by mouth at bedtime.    Historical Provider, MD  QUEtiapine (SEROQUEL) 50 MG tablet Take 50 mg by mouth at bedtime.    Historical Provider, MD  Vitamin D, Ergocalciferol, (DRISDOL) 50000 UNITS CAPS capsule Take 50,000 Units by mouth every 7 (seven) days. Takes on Wednesdays.    Historical Provider, MD   BP 141/99 mmHg  Pulse 98  Temp(Src) 98.4 F (36.9 C) (Oral)  Resp 16  Ht 5\' 6"  (1.676 m)  Wt 213 lb (96.616 kg)  BMI 34.40 kg/m2  SpO2 98%  LMP 09/09/2014 Physical Exam  Constitutional: She is oriented to person, place, and time. She appears well-developed and well-nourished.  Non-toxic appearance.  HENT:  Head: Normocephalic.  Right Ear: Tympanic membrane and external ear normal.  Left Ear: Tympanic membrane and external ear normal.  Eyes: EOM and lids are normal. Pupils are equal, round, and reactive to light.  Neck: Normal range of motion. Neck supple. Carotid bruit is not present.  Cardiovascular: Normal rate, regular rhythm, normal heart sounds, intact distal pulses and normal pulses.   Pulmonary/Chest: Breath sounds normal. No respiratory distress.  Abdominal: Soft. Bowel sounds are normal. There is no tenderness. There is CVA tenderness. There is no guarding.  Left CVA tenderness.  Musculoskeletal: Normal range of motion.  Lymphadenopathy:       Head (right side): No submandibular  adenopathy present.       Head (left side): No submandibular adenopathy present.    She has no cervical adenopathy.  Neurological: She is alert and oriented to person, place, and time. She has normal strength. No cranial nerve deficit or sensory deficit.  Skin: Skin is warm and dry.  Psychiatric: She has a normal mood and affect. Her speech is normal.  Nursing note and vitals reviewed.   ED Course  Procedures (including critical care time) Labs Review Labs Reviewed - No data to display  Imaging Review No results found.   EKG Interpretation None      MDM  Vital signs are well within normal limits. Pulse oximetry is 98% on room air. Within normal limits by my interpretation. Urine pregnancy test is negative. No evidence for pain related  to ectopic pregnancy. Urinalysis is normal. No evidence for renal stone or urinary tract infection. Basic metabolic panel is well within normal limits. No evidence for a Imbalance or kidney related issue.    After receiving pain medicine, patient was reexamined and pain is reproduced with range of motion of the flank and lower back area. There no hot areas appreciated. There is no palpable step off of the thoracic or the lumbar spine area. There is paraspinal area tenderness present on the left more than on the right.  CT abd/pelvis without contrast showes tiny calculus in the upper pole of the left kidney, No uretal calculus.   Plan - Rx for decadron , norco, and robaxin given for suspected musculoskeletal pain.   Final diagnoses:  Left flank pain  DDD (degenerative disc disease), lumbar    *I have reviewed nursing notes, vital signs, and all appropriate lab and imaging results for this patient.[address]    Jojo Geving M Ikechukwu Cerny, PA-C 09/22/14 1721  Vanetta MuldersScott Zackowski, MD 09/28/14 1715

## 2014-09-20 NOTE — ED Notes (Signed)
Patient with no complaints at this time. Respirations even and unlabored. Skin warm/dry. Discharge instructions reviewed with patient at this time. Patient given opportunity to voice concerns/ask questions. Patient discharged at this time and left Emergency Department with steady gait.   

## 2014-09-21 ENCOUNTER — Encounter (HOSPITAL_COMMUNITY): Payer: Self-pay | Admitting: Cardiology

## 2014-10-09 ENCOUNTER — Emergency Department (HOSPITAL_COMMUNITY)
Admission: EM | Admit: 2014-10-09 | Discharge: 2014-10-09 | Disposition: A | Discharge status: Home / Self Care | Payer: Medicaid Other | Attending: Emergency Medicine | Admitting: Emergency Medicine

## 2014-10-09 ENCOUNTER — Encounter (HOSPITAL_COMMUNITY): Payer: Self-pay

## 2014-10-09 DIAGNOSIS — F419 Anxiety disorder, unspecified: Secondary | ICD-10-CM | POA: Diagnosis not present

## 2014-10-09 DIAGNOSIS — G43909 Migraine, unspecified, not intractable, without status migrainosus: Secondary | ICD-10-CM | POA: Diagnosis not present

## 2014-10-09 DIAGNOSIS — K219 Gastro-esophageal reflux disease without esophagitis: Secondary | ICD-10-CM | POA: Diagnosis not present

## 2014-10-09 DIAGNOSIS — Z8639 Personal history of other endocrine, nutritional and metabolic disease: Secondary | ICD-10-CM | POA: Diagnosis not present

## 2014-10-09 DIAGNOSIS — G8929 Other chronic pain: Secondary | ICD-10-CM | POA: Diagnosis not present

## 2014-10-09 DIAGNOSIS — G40919 Epilepsy, unspecified, intractable, without status epilepticus: Secondary | ICD-10-CM

## 2014-10-09 DIAGNOSIS — M542 Cervicalgia: Secondary | ICD-10-CM | POA: Insufficient documentation

## 2014-10-09 DIAGNOSIS — Z79899 Other long term (current) drug therapy: Secondary | ICD-10-CM | POA: Insufficient documentation

## 2014-10-09 DIAGNOSIS — Z880 Allergy status to penicillin: Secondary | ICD-10-CM | POA: Diagnosis not present

## 2014-10-09 DIAGNOSIS — M79606 Pain in leg, unspecified: Secondary | ICD-10-CM | POA: Insufficient documentation

## 2014-10-09 DIAGNOSIS — G2581 Restless legs syndrome: Secondary | ICD-10-CM | POA: Insufficient documentation

## 2014-10-09 DIAGNOSIS — Z87442 Personal history of urinary calculi: Secondary | ICD-10-CM | POA: Diagnosis not present

## 2014-10-09 DIAGNOSIS — Z720 Tobacco use: Secondary | ICD-10-CM | POA: Diagnosis not present

## 2014-10-09 DIAGNOSIS — G40909 Epilepsy, unspecified, not intractable, without status epilepticus: Secondary | ICD-10-CM | POA: Diagnosis not present

## 2014-10-09 DIAGNOSIS — R569 Unspecified convulsions: Secondary | ICD-10-CM | POA: Diagnosis present

## 2014-10-09 DIAGNOSIS — M549 Dorsalgia, unspecified: Secondary | ICD-10-CM | POA: Insufficient documentation

## 2014-10-09 DIAGNOSIS — Z87448 Personal history of other diseases of urinary system: Secondary | ICD-10-CM | POA: Diagnosis not present

## 2014-10-09 LAB — CBC WITH DIFFERENTIAL/PLATELET
BASOS ABS: 0.1 10*3/uL (ref 0.0–0.1)
BASOS PCT: 1 % (ref 0–1)
EOS PCT: 4 % (ref 0–5)
Eosinophils Absolute: 0.4 10*3/uL (ref 0.0–0.7)
HCT: 41 % (ref 36.0–46.0)
Hemoglobin: 13.7 g/dL (ref 12.0–15.0)
LYMPHS PCT: 30 % (ref 12–46)
Lymphs Abs: 3.1 10*3/uL (ref 0.7–4.0)
MCH: 28.5 pg (ref 26.0–34.0)
MCHC: 33.4 g/dL (ref 30.0–36.0)
MCV: 85.4 fL (ref 78.0–100.0)
Monocytes Absolute: 0.6 10*3/uL (ref 0.1–1.0)
Monocytes Relative: 5 % (ref 3–12)
Neutro Abs: 6.4 10*3/uL (ref 1.7–7.7)
Neutrophils Relative %: 60 % (ref 43–77)
PLATELETS: 259 10*3/uL (ref 150–400)
RBC: 4.8 MIL/uL (ref 3.87–5.11)
RDW: 14.8 % (ref 11.5–15.5)
WBC: 10.5 10*3/uL (ref 4.0–10.5)

## 2014-10-09 LAB — BASIC METABOLIC PANEL
ANION GAP: 12 (ref 5–15)
BUN: 14 mg/dL (ref 6–23)
CALCIUM: 9.3 mg/dL (ref 8.4–10.5)
CO2: 24 meq/L (ref 19–32)
Chloride: 103 mEq/L (ref 96–112)
Creatinine, Ser: 0.77 mg/dL (ref 0.50–1.10)
GFR calc non Af Amer: >90 mL/min (ref >90)
Glucose, Bld: 84 mg/dL (ref 70–99)
Potassium: 4 mEq/L (ref 3.7–5.3)
Sodium: 139 mEq/L (ref 137–147)

## 2014-10-09 MED ORDER — KETOROLAC TROMETHAMINE 30 MG/ML IJ SOLN
30.0000 mg | Freq: Once | INTRAMUSCULAR | Status: AC
  Administered 2014-10-09: 30 mg via INTRAVENOUS
  Filled 2014-10-09: qty 1

## 2014-10-09 NOTE — ED Notes (Signed)
Pt states she had a seizure this morning around 1000. Per EMS, pt's boyfriend witnessed seizure and it lasted about 17 minutes. Pt states she is in the process of changing neurologist.

## 2014-10-09 NOTE — Discharge Instructions (Signed)
Ibuprofen 600 mg every 6 hours as needed for pain.  Continue your seizure medications as before.  Follow-up with your neurologist as scheduled, and return to the ER if you experience any new and concerning symptoms.

## 2014-10-09 NOTE — ED Provider Notes (Signed)
CSN: [id number]     Arrival date & time 10/09/14  1128 History  This chart was scribe for Geoffery Lyonsouglas Reinhart Saulters, MD by Angelene GiovanniEmmanuella Mensah, ED Scribe. The patient was seen in room APA05/[facility]05 and the patient's care was started at 1:07 PM.    Chief Complaint  Patient presents with  . Seizures   Patient is a 33 y.o. female presenting with seizures. The history is provided by the patient. No language interpreter was used.  Seizures Seizure activity on arrival: no   Episode characteristics: focal shaking   Episode characteristics: no tongue biting   Duration:  17 minutes Timing:  Once Number of seizures this episode:  1 Context: not drug use, not fever and medical compliance   Recent head injury:  No recent head injuries History of seizures: yes   Similar to previous episodes: no    HPI Comments: Isabella Buchanan is a 33 y.o. female with a hx of seizure since last October who presents to the Emergency Department status post seizure that lasted 17 minutes onset this morning. She reports associated back pain, leg pain, and neck pain. She denies any illness or fever prior to the episode. She denies tongue biting or biting the inside of her mouth during the episode. She is currently on medication for her seizures and she says that she has been compiant with it. Her fiance adds that she was shaking and tensing up during the entire episode. He states that half and hour after the episode, she was not able to wake up and EMS used a "smelling salt" to wake her up which for her is unusual. She denies alcohol use or drug abuse.   She has a new neurologist whom she saw 2 days ago.   Past Medical History  Diagnosis Date  . Anxiety   . Fibromyalgia   . Restless leg   . Chronic back pain   . GERD (gastroesophageal reflux disease)   . Headache(784.0)     mygrains  . PONV (postoperative nausea and vomiting)   . Interstitial cystitis   . Polycystic ovarian syndrome   . Hiatal hernia   . Seizures   . IBS  (irritable bowel syndrome)   . Chronic pain   . Chronic flank pain   . Kidney stones   . History of electroencephalogram 11/2013    normal EEG, "psychogenic nonepileptic spell" during EEG   Past Surgical History  Procedure Laterality Date  . Cholecystectomy    . Tonsillectomy    . Tubal ligation    . Tympanoplasty    . Esophagogastroduodenoscopy endoscopy     Family History  Problem Relation Age of Onset  . Hypertension Father   . Diabetes Father   . Cancer Other   . Diabetes Other   . Seizures Father    History  Substance Use Topics  . Smoking status: Current Every Day Smoker -- 1.00 packs/day for 15 years    Types: Cigarettes  . Smokeless tobacco: Never Used  . Alcohol Use: No   OB History    Gravida Para Term Preterm AB TAB SAB Ectopic Multiple Living   2 2  2      2      Review of Systems  Neurological: Positive for seizures.  All other systems reviewed and are negative.     Allergies  Omnicef; Codeine; Sulfa antibiotics; Amoxicillin; and Penicillins  Home Medications   Prior to Admission medications   Medication Sig Start Date End Date Taking? Authorizing Provider  acetaminophen (TYLENOL) 500 MG tablet Take 1,500 mg by mouth every 8 (eight) hours as needed for moderate pain.   Yes Historical Provider, MD  Aspirin-Acetaminophen-Caffeine (GOODYS EXTRA STRENGTH) [phone] MG PACK Take 2 Packages by mouth every 6 (six) hours as needed (Pain).   Yes Historical Provider, MD  dexlansoprazole (DEXILANT) 60 MG capsule Take 60 mg by mouth daily as needed (for acid reflux).    Yes Historical Provider, MD  diazepam (VALIUM) 5 MG tablet Take 5 mg by mouth 3 (three) times daily. FOR SEIZURES 07/17/14  Yes Leroy Sea, MD  diphenhydrAMINE (BENADRYL) 25 MG tablet Take 50 mg by mouth every 4 (four) hours as needed for allergies.    Yes Historical Provider, MD  ibuprofen (ADVIL,MOTRIN) 200 MG tablet Take 800 mg by mouth every 6 (six) hours as needed for pain.    Yes  Historical Provider, MD  iron polysaccharides (NIFEREX) 150 MG capsule Take 150 mg by mouth 2 (two) times daily.   Yes Historical Provider, MD  lacosamide (VIMPAT) 200 MG TABS tablet Take 200 mg by mouth 2 (two) times daily.   Yes Historical Provider, MD  lamoTRIgine (LAMICTAL) 25 MG tablet Take 25 mg by mouth 2 (two) times daily.   Yes Historical Provider, MD  ondansetron (ZOFRAN-ODT) 8 MG disintegrating tablet Take 8 mg by mouth 3 (three) times daily as needed. Nausea & vomitting 09/23/14  Yes Historical Provider, MD  pramipexole (MIRAPEX) 1 MG tablet Take 1 mg by mouth at bedtime.   Yes Historical Provider, MD  QUEtiapine (SEROQUEL) 50 MG tablet Take 50 mg by mouth at bedtime.   Yes Historical Provider, MD  Vitamin D, Ergocalciferol, (DRISDOL) 50000 UNITS CAPS capsule Take 50,000 Units by mouth every 7 (seven) days. Takes on Wednesdays.   Yes Historical Provider, MD  dexamethasone (DECADRON) 6 MG tablet 1 po bid with food Patient not taking: Reported on 10/09/2014 09/20/14   Kathie Dike, PA-C  HYDROcodone-acetaminophen (NORCO/VICODIN) 5-325 MG per tablet Take 1 tablet by mouth every 4 (four) hours as needed. Patient not taking: Reported on 10/09/2014 09/20/14   Kathie Dike, PA-C  methocarbamol (ROBAXIN) 500 MG tablet Take 1 tablet (500 mg total) by mouth 3 (three) times daily. Patient not taking: Reported on 10/09/2014 09/20/14   Kathie Dike, PA-C  ondansetron (ZOFRAN) 4 MG tablet Take 1 tablet (4 mg total) by mouth every 8 (eight) hours as needed for nausea or vomiting. Patient not taking: Reported on 10/09/2014 07/17/14   Leroy Sea, MD   BP 133/87 mmHg  Pulse 88  Resp 18  Ht 5\' 6"  (1.676 m)  Wt 218 lb (98.884 kg)  BMI 35.20 kg/m2  SpO2 99%  LMP 09/09/2014 Physical Exam  Constitutional: She is oriented to person, place, and time. She appears well-developed and well-nourished.  HENT:  Head: Normocephalic and atraumatic.  Mouth/Throat: Oropharynx is clear and moist.   Eyes: EOM are normal. Pupils are equal, round, and reactive to light.  Cardiovascular: Normal rate and regular rhythm.   No murmur heard. Pulmonary/Chest: Effort normal and breath sounds normal. No respiratory distress.  Abdominal: Soft. She exhibits no distension. There is no tenderness.  Musculoskeletal: Normal range of motion. She exhibits no edema.  Neurological: She is alert and oriented to person, place, and time. No cranial nerve deficit. She exhibits normal muscle tone. Coordination normal.  Skin: Skin is warm and dry.  Psychiatric: She has a normal mood and affect.  Nursing note and vitals reviewed.  ED Course  Procedures (including critical care time) DIAGNOSTIC STUDIES: Oxygen Saturation is 99% on RA, normal by my interpretation.    COORDINATION OF CARE: 1:11 PM- Pt advised of plan for treatment and pt agrees.    Labs Review Labs Reviewed  CBC WITH DIFFERENTIAL  BASIC METABOLIC PANEL    Imaging Review No results found.   EKG Interpretation None      MDM   Final diagnoses:  None    Patient is a 33 year old female with history of seizure disorder. She presents for evaluation after a seizure that occurred this afternoon.  She is now neurologically intact and reveals no evidence of a postictal state. Her workup reveals on remarkable laboratory studies and there are no medications for which levels can be checked. I discussed the case with Dr. Gerilyn Pilgrimoonquah who is familiar with this patient. He is recommending no medication changes and follow-up. Patient is now requesting to go home. She states she feels better and no longer wants to be here.  I personally performed the services described in this documentation, which was scribed in my presence. The recorded information has been reviewed and is accurate.     Geoffery Lyonsouglas Cotina Freedman, MD 10/09/14 2140

## 2014-10-10 ENCOUNTER — Emergency Department (HOSPITAL_COMMUNITY)
Admission: EM | Admit: 2014-10-10 | Discharge: 2014-10-10 | Disposition: A | Discharge status: Home / Self Care | Payer: Medicaid Other | Attending: Emergency Medicine | Admitting: Emergency Medicine

## 2014-10-10 ENCOUNTER — Encounter (HOSPITAL_COMMUNITY): Payer: Self-pay

## 2014-10-10 DIAGNOSIS — Z87448 Personal history of other diseases of urinary system: Secondary | ICD-10-CM | POA: Diagnosis not present

## 2014-10-10 DIAGNOSIS — Z880 Allergy status to penicillin: Secondary | ICD-10-CM | POA: Insufficient documentation

## 2014-10-10 DIAGNOSIS — Z8742 Personal history of other diseases of the female genital tract: Secondary | ICD-10-CM | POA: Diagnosis not present

## 2014-10-10 DIAGNOSIS — G2581 Restless legs syndrome: Secondary | ICD-10-CM | POA: Diagnosis not present

## 2014-10-10 DIAGNOSIS — F419 Anxiety disorder, unspecified: Secondary | ICD-10-CM | POA: Insufficient documentation

## 2014-10-10 DIAGNOSIS — Z79899 Other long term (current) drug therapy: Secondary | ICD-10-CM | POA: Insufficient documentation

## 2014-10-10 DIAGNOSIS — G8929 Other chronic pain: Secondary | ICD-10-CM | POA: Diagnosis not present

## 2014-10-10 DIAGNOSIS — K219 Gastro-esophageal reflux disease without esophagitis: Secondary | ICD-10-CM | POA: Insufficient documentation

## 2014-10-10 DIAGNOSIS — M545 Low back pain, unspecified: Secondary | ICD-10-CM

## 2014-10-10 DIAGNOSIS — Z720 Tobacco use: Secondary | ICD-10-CM | POA: Insufficient documentation

## 2014-10-10 DIAGNOSIS — Z87442 Personal history of urinary calculi: Secondary | ICD-10-CM | POA: Diagnosis not present

## 2014-10-10 DIAGNOSIS — G40909 Epilepsy, unspecified, not intractable, without status epilepticus: Secondary | ICD-10-CM | POA: Insufficient documentation

## 2014-10-10 MED ORDER — HYDROCODONE-ACETAMINOPHEN 5-325 MG PO TABS
1.0000 | ORAL_TABLET | Freq: Once | ORAL | Status: AC
Start: 2014-10-10 — End: 2014-10-10
  Administered 2014-10-10: 1 via ORAL
  Filled 2014-10-10: qty 1

## 2014-10-10 MED ORDER — METHOCARBAMOL 500 MG PO TABS: 1000.0000 mg | ORAL_TABLET | Freq: Four times a day (QID) | ORAL | Status: AC

## 2014-10-10 MED ORDER — METHOCARBAMOL 500 MG PO TABS
1000.0000 mg | ORAL_TABLET | Freq: Once | ORAL | Status: AC
  Administered 2014-10-10: 1000 mg via ORAL
  Filled 2014-10-10: qty 2

## 2014-10-10 MED ORDER — HYDROCODONE-ACETAMINOPHEN 5-325 MG PO TABS: 1.0000 | ORAL_TABLET | ORAL | Status: DC | PRN

## 2014-10-10 NOTE — Discharge Instructions (Signed)
Back Pain, Adult Back pain is very common. The pain often gets better over time. The cause of back pain is usually not dangerous. Most people can learn to manage their back pain on their own.  HOME CARE   Stay active. Start with short walks on flat ground if you can. Try to walk farther each day.  Do not sit, drive, or stand in one place for more than 30 minutes. Do not stay in bed.  Do not avoid exercise or work. Activity can help your back heal faster.  Be careful when you bend or lift an object. Bend at your knees, keep the object close to you, and do not twist.  Sleep on a firm mattress. Lie on your side, and bend your knees. If you lie on your back, put a pillow under your knees.  Only take medicines as told by your doctor.  Put ice on the injured area.  Put ice in a plastic bag.  Place a towel between your skin and the bag.  Leave the ice on for 15-20 minutes, 03-04 times a day for the first 2 to 3 days. After that, you can switch between ice and heat packs.  Ask your doctor about back exercises or massage.  Avoid feeling anxious or stressed. Find good ways to deal with stress, such as exercise. GET HELP RIGHT AWAY IF:   Your pain does not go away with rest or medicine.  Your pain does not go away in 1 week.  You have new problems.  You do not feel well.  The pain spreads into your legs.  You cannot control when you poop (bowel movement) or pee (urinate).  Your arms or legs feel weak or lose feeling (numbness).  You feel sick to your stomach (nauseous) or throw up (vomit).  You have belly (abdominal) pain.  You feel like you may pass out (faint). MAKE SURE YOU:   Understand these instructions.  Will watch your condition.  Will get help right away if you are not doing well or get worse. Document Released: 04/24/2008 Document Revised: 01/29/2012 Document Reviewed: 03/10/2014 [facility] Patient Information 2015 [place]. This information is not intended  to replace advice given to you by your health care provider. Make sure you discuss any questions you have with your health care provider.    Do not drive within 4 hours of taking hydrocodone as this will make you drowsy.  Avoid lifting,  Bending,  Twisting or any other activity that worsens your pain over the next week.  Apply an  icepack  to your lower back for 10-15 minutes every 2 hours for the next 2 days.  You should get rechecked if your symptoms are not better over the next 5 days,  Or you develop increased pain,  Weakness in your leg(s) or loss of bladder or bowel function - these are symptoms of a worse injury.

## 2014-10-10 NOTE — ED Notes (Signed)
Had a seizure last night (was seen here) woke this morning with low back pain. Pain now 8/10

## 2014-10-10 NOTE — ED Notes (Signed)
Pt verbalized understanding of no driving and to use caution within 4 hours of taking pain meds due to meds cause drowsiness 

## 2014-10-11 NOTE — ED Provider Notes (Signed)
CSN: [id number]     Arrival date & time 10/10/14  1911 History   First MD Initiated Contact with Patient 10/10/14 1946     Chief Complaint  Patient presents with  . Back Pain     (Consider location/radiation/quality/duration/timing/severity/associated sxs/prior Treatment) The history is provided by the patient.   Isabella Buchanan is a 33 y.o. female with a history significant for chronic low back pain and seizure disorder.  She had a seizure yesterday and was seen here for this episode and now reports worsening of her chronic low back pain.  She denies falling, stating she sleeps on a mattress that sits on the floor, was actually sitting up watching tv when the seizure occurred.  She has increased pain in her lower back along with muscle spasm and tightness. She denies urinary or fecal incontinence or retention, she has pain that radiates to her bilateral lower buttocks, denies lower extremity weakness or numbness.  She has used robaxin and hydrocodone in the past which helps resolve her flare ups.  Pt denies fevers, chills.    Past Medical History  Diagnosis Date  . Anxiety   . Fibromyalgia   . Restless leg   . Chronic back pain   . GERD (gastroesophageal reflux disease)   . Headache(784.0)     mygrains  . PONV (postoperative nausea and vomiting)   . Interstitial cystitis   . Polycystic ovarian syndrome   . Hiatal hernia   . Seizures   . IBS (irritable bowel syndrome)   . Chronic pain   . Chronic flank pain   . Kidney stones   . History of electroencephalogram 11/2013    normal EEG, "psychogenic nonepileptic spell" during EEG   Past Surgical History  Procedure Laterality Date  . Cholecystectomy    . Tonsillectomy    . Tubal ligation    . Tympanoplasty    . Esophagogastroduodenoscopy endoscopy     Family History  Problem Relation Age of Onset  . Hypertension Father   . Diabetes Father   . Cancer Other   . Diabetes Other   . Seizures Father    History  Substance  Use Topics  . Smoking status: Current Every Day Smoker -- 1.00 packs/day for 15 years    Types: Cigarettes  . Smokeless tobacco: Never Used  . Alcohol Use: No   OB History    Gravida Para Term Preterm AB TAB SAB Ectopic Multiple Living   2 2  2      2      Review of Systems  Constitutional: Negative for fever.  Respiratory: Negative for shortness of breath.   Cardiovascular: Negative for chest pain and leg swelling.  Gastrointestinal: Negative for abdominal pain, constipation and abdominal distention.  Genitourinary: Negative for dysuria, urgency, frequency, flank pain and difficulty urinating.  Musculoskeletal: Positive for back pain. Negative for joint swelling and gait problem.  Skin: Negative for rash.  Neurological: Negative for weakness and numbness.      Allergies  Omnicef; Codeine; Sulfa antibiotics; Amoxicillin; and Penicillins  Home Medications   Prior to Admission medications   Medication Sig Start Date End Date Taking? Authorizing Provider  acetaminophen (TYLENOL) 500 MG tablet Take 1,500 mg by mouth every 8 (eight) hours as needed for moderate pain.   Yes Historical Provider, MD  Aspirin-Acetaminophen-Caffeine (GOODYS EXTRA STRENGTH) [phone] MG PACK Take 2 Packages by mouth every 6 (six) hours as needed (Pain).   Yes Historical Provider, MD  dexlansoprazole (DEXILANT) 60 MG capsule Take  60 mg by mouth daily as needed (for acid reflux).    Yes Historical Provider, MD  diazepam (VALIUM) 5 MG tablet Take 5 mg by mouth 3 (three) times daily. FOR SEIZURES 07/17/14  Yes Leroy SeaPrashant K Singh, MD  diphenhydramine-acetaminophen (TYLENOL PM) 25-500 MG TABS Take 1 tablet by mouth at bedtime as needed.   Yes Historical Provider, MD  ibuprofen (ADVIL,MOTRIN) 200 MG tablet Take 800 mg by mouth every 6 (six) hours as needed for pain.    Yes Historical Provider, MD  iron polysaccharides (NIFEREX) 150 MG capsule Take 150 mg by mouth 2 (two) times daily.   Yes Historical Provider, MD   lacosamide (VIMPAT) 200 MG TABS tablet Take 200 mg by mouth 2 (two) times daily.   Yes Historical Provider, MD  lamoTRIgine (LAMICTAL) 25 MG tablet Take 25 mg by mouth 2 (two) times daily.   Yes Historical Provider, MD  ondansetron (ZOFRAN-ODT) 8 MG disintegrating tablet Take 8 mg by mouth 3 (three) times daily as needed. Nausea & vomitting 09/23/14  Yes Historical Provider, MD  pramipexole (MIRAPEX) 1 MG tablet Take 1 mg by mouth at bedtime.   Yes Historical Provider, MD  QUEtiapine (SEROQUEL) 50 MG tablet Take 50 mg by mouth at bedtime.   Yes Historical Provider, MD  Vitamin D, Ergocalciferol, (DRISDOL) 50000 UNITS CAPS capsule Take 50,000 Units by mouth every 7 (seven) days. Takes on Wednesdays.   Yes Historical Provider, MD  dexamethasone (DECADRON) 6 MG tablet 1 po bid with food Patient not taking: Reported on 10/09/2014 09/20/14   Kathie DikeHobson M Bryant, PA-C  HYDROcodone-acetaminophen (NORCO/VICODIN) 5-325 MG per tablet Take 1 tablet by mouth every 4 (four) hours as needed. 10/10/14   Burgess AmorJulie Xinyi Batton, PA-C  methocarbamol (ROBAXIN) 500 MG tablet Take 2 tablets (1,000 mg total) by mouth 4 (four) times daily. 10/10/14 10/20/14  Burgess AmorJulie Anasofia Micallef, PA-C  ondansetron (ZOFRAN) 4 MG tablet Take 1 tablet (4 mg total) by mouth every 8 (eight) hours as needed for nausea or vomiting. Patient not taking: Reported on 10/09/2014 07/17/14   Leroy SeaPrashant K Singh, MD   BP 134/94 mmHg  Pulse 105  Temp(Src) 98 F (36.7 C) (Oral)  Resp 20  Ht 5\' 7"  (1.702 m)  Wt 219 lb (99.338 kg)  BMI 34.29 kg/m2  SpO2 98%  LMP 09/09/2014 Physical Exam  Constitutional: She appears well-developed and well-nourished.  HENT:  Head: Normocephalic.  Eyes: Conjunctivae are normal.  Neck: Normal range of motion. Neck supple.  Cardiovascular: Normal rate and intact distal pulses.   Pedal pulses normal.  Pulmonary/Chest: Effort normal.  Abdominal: Soft. Bowel sounds are normal. She exhibits no distension and no mass.  Musculoskeletal: Normal  range of motion. She exhibits no edema.       Lumbar back: She exhibits tenderness. She exhibits no bony tenderness, no swelling, no edema and no spasm.  Bilateral para lumbar ttp. No midline pain.  Neurological: She is alert. She has normal strength. She displays no atrophy and no tremor. No sensory deficit. Gait normal.  Reflex Scores:      Patellar reflexes are 2+ on the right side and 2+ on the left side.      Achilles reflexes are 2+ on the right side and 2+ on the left side. No strength deficit noted in hip and knee flexor and extensor muscle groups.  Ankle flexion and extension intact.  Skin: Skin is warm and dry.  Psychiatric: She has a normal mood and affect.  Nursing note and vitals reviewed.  ED Course  Procedures (including critical care time) Labs Review Labs Reviewed - No data to display  Imaging Review No results found.   EKG Interpretation None      MDM   Final diagnoses:  Bilateral low back pain without sciatica    No neuro deficit on exam or by history to suggest emergent or surgical presentation.  Also discussed worsened sx that should prompt immediate re-evaluation including distal weakness, bowel/bladder retention/incontinence.  Pt prescribed short course of hydrocodone, robaxin.  She was encouraged to f/u with her pcp for further management if sx persist.        Burgess Amor, PA-C 10/11/14 1250  [name], MD 10/11/14 [phone]

## 2014-10-19 ENCOUNTER — Emergency Department (HOSPITAL_COMMUNITY)
Admission: EM | Admit: 2014-10-19 | Discharge: 2014-10-19 | Disposition: A | Discharge status: Home / Self Care | Payer: Medicaid Other | Attending: Emergency Medicine | Admitting: Emergency Medicine

## 2014-10-19 ENCOUNTER — Encounter (HOSPITAL_COMMUNITY): Payer: Self-pay

## 2014-10-19 DIAGNOSIS — M1990 Unspecified osteoarthritis, unspecified site: Secondary | ICD-10-CM | POA: Insufficient documentation

## 2014-10-19 DIAGNOSIS — M545 Low back pain: Secondary | ICD-10-CM | POA: Insufficient documentation

## 2014-10-19 DIAGNOSIS — G40909 Epilepsy, unspecified, not intractable, without status epilepticus: Secondary | ICD-10-CM | POA: Diagnosis not present

## 2014-10-19 DIAGNOSIS — Z7982 Long term (current) use of aspirin: Secondary | ICD-10-CM | POA: Diagnosis not present

## 2014-10-19 DIAGNOSIS — Z720 Tobacco use: Secondary | ICD-10-CM | POA: Insufficient documentation

## 2014-10-19 DIAGNOSIS — F419 Anxiety disorder, unspecified: Secondary | ICD-10-CM | POA: Insufficient documentation

## 2014-10-19 DIAGNOSIS — K219 Gastro-esophageal reflux disease without esophagitis: Secondary | ICD-10-CM | POA: Diagnosis not present

## 2014-10-19 DIAGNOSIS — Z880 Allergy status to penicillin: Secondary | ICD-10-CM | POA: Diagnosis not present

## 2014-10-19 DIAGNOSIS — M549 Dorsalgia, unspecified: Secondary | ICD-10-CM | POA: Diagnosis present

## 2014-10-19 DIAGNOSIS — M5416 Radiculopathy, lumbar region: Secondary | ICD-10-CM | POA: Insufficient documentation

## 2014-10-19 DIAGNOSIS — M4726 Other spondylosis with radiculopathy, lumbar region: Secondary | ICD-10-CM

## 2014-10-19 DIAGNOSIS — G8929 Other chronic pain: Secondary | ICD-10-CM | POA: Diagnosis not present

## 2014-10-19 DIAGNOSIS — Z87442 Personal history of urinary calculi: Secondary | ICD-10-CM | POA: Diagnosis not present

## 2014-10-19 MED ORDER — PREDNISONE 20 MG PO TABS
40.0000 mg | ORAL_TABLET | Freq: Once | ORAL | Status: AC
  Administered 2014-10-19: 40 mg via ORAL
  Filled 2014-10-19: qty 2

## 2014-10-19 MED ORDER — PREDNISONE 20 MG PO TABS: 20.0000 mg | ORAL_TABLET | Freq: Two times a day (BID) | ORAL | Status: DC

## 2014-10-19 NOTE — ED Provider Notes (Signed)
CSN: [id number]     Arrival date & time 10/19/14  0113 History   None    Chief Complaint  Patient presents with  . Back Pain     (Consider location/radiation/quality/duration/timing/severity/associated sxs/prior Treatment) HPI    Isabella Buchanan is a 33 y.o. female who reports that she has had persistent back pain since she was seen a couple weeks ago and diagnosed with degenerative disc disease.  She has run out of her pain medicines, and states that she is not currently seeing a pain treating physician.  She denies fever, chills, nausea, vomiting, weakness or dizziness.  The pain in her low back, goes to her left leg.  There are no other known modifying factors.    Past Medical History  Diagnosis Date  . Anxiety   . Fibromyalgia   . Restless leg   . Chronic back pain   . GERD (gastroesophageal reflux disease)   . Headache(784.0)     mygrains  . PONV (postoperative nausea and vomiting)   . Interstitial cystitis   . Polycystic ovarian syndrome   . Hiatal hernia   . Seizures   . IBS (irritable bowel syndrome)   . Chronic pain   . Chronic flank pain   . Kidney stones   . History of electroencephalogram 11/2013    normal EEG, "psychogenic nonepileptic spell" during EEG   Past Surgical History  Procedure Laterality Date  . Cholecystectomy    . Tonsillectomy    . Tubal ligation    . Tympanoplasty    . Esophagogastroduodenoscopy endoscopy     Family History  Problem Relation Age of Onset  . Hypertension Father   . Diabetes Father   . Cancer Other   . Diabetes Other   . Seizures Father    History  Substance Use Topics  . Smoking status: Current Every Day Smoker -- 1.00 packs/day for 15 years    Types: Cigarettes  . Smokeless tobacco: Never Used  . Alcohol Use: No   OB History    Gravida Para Term Preterm AB TAB SAB Ectopic Multiple Living   2 2  2      2      Review of Systems  All other systems reviewed and are negative.     Allergies  Omnicef;  Codeine; Sulfa antibiotics; Amoxicillin; and Penicillins  Home Medications   Prior to Admission medications   Medication Sig Start Date End Date Taking? Authorizing Provider  acetaminophen (TYLENOL) 500 MG tablet Take 1,500 mg by mouth every 8 (eight) hours as needed for moderate pain.    Historical Provider, MD  Aspirin-Acetaminophen-Caffeine (GOODYS EXTRA STRENGTH) [phone] MG PACK Take 2 Packages by mouth every 6 (six) hours as needed (Pain).    Historical Provider, MD  dexamethasone (DECADRON) 6 MG tablet 1 po bid with food Patient not taking: Reported on 10/09/2014 09/20/14   Kathie DikeHobson M Bryant, PA-C  dexlansoprazole (DEXILANT) 60 MG capsule Take 60 mg by mouth daily as needed (for acid reflux).     Historical Provider, MD  diazepam (VALIUM) 5 MG tablet Take 5 mg by mouth 3 (three) times daily. FOR SEIZURES 07/17/14   Leroy SeaPrashant K Singh, MD  diphenhydramine-acetaminophen (TYLENOL PM) 25-500 MG TABS Take 1 tablet by mouth at bedtime as needed.    Historical Provider, MD  HYDROcodone-acetaminophen (NORCO/VICODIN) 5-325 MG per tablet Take 1 tablet by mouth every 4 (four) hours as needed. 10/10/14   Burgess AmorJulie Idol, PA-C  ibuprofen (ADVIL,MOTRIN) 200 MG tablet Take 800 mg by  mouth every 6 (six) hours as needed for pain.     Historical Provider, MD  iron polysaccharides (NIFEREX) 150 MG capsule Take 150 mg by mouth 2 (two) times daily.    Historical Provider, MD  lacosamide (VIMPAT) 200 MG TABS tablet Take 200 mg by mouth 2 (two) times daily.    Historical Provider, MD  lamoTRIgine (LAMICTAL) 25 MG tablet Take 25 mg by mouth 2 (two) times daily.    Historical Provider, MD  methocarbamol (ROBAXIN) 500 MG tablet Take 2 tablets (1,000 mg total) by mouth 4 (four) times daily. 10/10/14 10/20/14  Burgess AmorJulie Idol, PA-C  ondansetron (ZOFRAN) 4 MG tablet Take 1 tablet (4 mg total) by mouth every 8 (eight) hours as needed for nausea or vomiting. Patient not taking: Reported on 10/09/2014 07/17/14   Leroy SeaPrashant K Singh, MD   ondansetron (ZOFRAN-ODT) 8 MG disintegrating tablet Take 8 mg by mouth 3 (three) times daily as needed. Nausea & vomitting 09/23/14   Historical Provider, MD  pramipexole (MIRAPEX) 1 MG tablet Take 1 mg by mouth at bedtime.    Historical Provider, MD  QUEtiapine (SEROQUEL) 50 MG tablet Take 50 mg by mouth at bedtime.    Historical Provider, MD  Vitamin D, Ergocalciferol, (DRISDOL) 50000 UNITS CAPS capsule Take 50,000 Units by mouth every 7 (seven) days. Takes on Wednesdays.    Historical Provider, MD   BP 142/96 mmHg  Pulse 97  Temp(Src) 98.2 F (36.8 C) (Oral)  Resp 16  Ht 5\' 6"  (1.676 m)  Wt 219 lb (99.338 kg)  BMI 35.36 kg/m2  SpO2 100%  LMP 09/21/2014 (Approximate) Physical Exam  Constitutional: She is oriented to person, place, and time. She appears well-developed and well-nourished.  HENT:  Head: Normocephalic and atraumatic.  Right Ear: External ear normal.  Left Ear: External ear normal.  Eyes: Conjunctivae and EOM are normal. Pupils are equal, round, and reactive to light.  Neck: Normal range of motion and phonation normal. Neck supple.  Cardiovascular: Normal rate, regular rhythm and normal heart sounds.   Pulmonary/Chest: Effort normal and breath sounds normal. She exhibits no bony tenderness.  Musculoskeletal: Normal range of motion.  Tender bilateral lumbar, with light touch.  She is able to sit up on the stretcher easily and does not evidence pain when she does so.  Neurological: She is alert and oriented to person, place, and time. No cranial nerve deficit or sensory deficit. She exhibits normal muscle tone. Coordination normal.  Skin: Skin is warm, dry and intact.  Psychiatric: She has a normal mood and affect. Her behavior is normal. Judgment and thought content normal.  Nursing note and vitals reviewed.   ED Course  Procedures (including critical care time)  [facility] [place] [facility] [place] [place], indicates, she receives  multiple narcotic prescriptions, from multiple medical providers, including providers that appear to be prescribing chronic narcotic treatments.  She has also had multiple ED visits in the last 30 days, for multiple medical problems, for which she has received narcotic therapy.   Labs Review Labs Reviewed - No data to display  Imaging Review No results found.   EKG Interpretation None      MDM   Final diagnoses:  Chronic back pain  Osteoarthritis of spine with radiculopathy, lumbar region    Nonspecific low back pain, with a history of degenerative joint disease, and chronic pain.  There is no indication that she needs narcotic treatment, or any other evaluation in the emergency department setting.  Nursing Notes Reviewed/ Care Coordinated Applicable Imaging Reviewed Interpretation of Laboratory Data incorporated into ED treatment  The patient appears reasonably screened and/or stabilized for discharge and I doubt any other medical condition or other [facility] requiring further screening, evaluation, or treatment in the ED at this time prior to discharge.  Plan: Home Medications- prednisone; Home Treatments- rest; return here if the recommended treatment, does not improve the symptoms; Recommended follow up- PCP follow-up for further care as needed     Flint Melter, MD 10/19/14 [phone]

## 2014-10-19 NOTE — ED Notes (Addendum)
Pt states she was seen here for what she thought was a kidney stone. Pt states she found out she had DDD and that the pain has gotten worse. C/o pain to left lower back running down the back of her thigh.

## 2014-10-19 NOTE — ED Notes (Signed)
Having back pain with history of degenerative disc disease per pt.

## 2014-10-19 NOTE — Discharge Instructions (Signed)

## 2014-10-30 ENCOUNTER — Emergency Department (HOSPITAL_COMMUNITY)
Admission: EM | Admit: 2014-10-30 | Discharge: 2014-10-30 | Disposition: A | Discharge status: Home / Self Care | Payer: Medicaid Other | Attending: Emergency Medicine | Admitting: Emergency Medicine

## 2014-10-30 ENCOUNTER — Encounter (HOSPITAL_COMMUNITY): Payer: Self-pay | Admitting: Emergency Medicine

## 2014-10-30 DIAGNOSIS — Z8639 Personal history of other endocrine, nutritional and metabolic disease: Secondary | ICD-10-CM | POA: Diagnosis not present

## 2014-10-30 DIAGNOSIS — B370 Candidal stomatitis: Secondary | ICD-10-CM | POA: Insufficient documentation

## 2014-10-30 DIAGNOSIS — Z87448 Personal history of other diseases of urinary system: Secondary | ICD-10-CM | POA: Insufficient documentation

## 2014-10-30 DIAGNOSIS — G40909 Epilepsy, unspecified, not intractable, without status epilepticus: Secondary | ICD-10-CM | POA: Insufficient documentation

## 2014-10-30 DIAGNOSIS — Z720 Tobacco use: Secondary | ICD-10-CM | POA: Insufficient documentation

## 2014-10-30 DIAGNOSIS — K1379 Other lesions of oral mucosa: Secondary | ICD-10-CM

## 2014-10-30 DIAGNOSIS — G8929 Other chronic pain: Secondary | ICD-10-CM | POA: Insufficient documentation

## 2014-10-30 DIAGNOSIS — J029 Acute pharyngitis, unspecified: Secondary | ICD-10-CM | POA: Diagnosis present

## 2014-10-30 DIAGNOSIS — H6501 Acute serous otitis media, right ear: Secondary | ICD-10-CM | POA: Diagnosis not present

## 2014-10-30 DIAGNOSIS — Z880 Allergy status to penicillin: Secondary | ICD-10-CM | POA: Diagnosis not present

## 2014-10-30 DIAGNOSIS — F419 Anxiety disorder, unspecified: Secondary | ICD-10-CM | POA: Insufficient documentation

## 2014-10-30 DIAGNOSIS — Z7952 Long term (current) use of systemic steroids: Secondary | ICD-10-CM | POA: Insufficient documentation

## 2014-10-30 DIAGNOSIS — K219 Gastro-esophageal reflux disease without esophagitis: Secondary | ICD-10-CM | POA: Insufficient documentation

## 2014-10-30 DIAGNOSIS — Z87442 Personal history of urinary calculi: Secondary | ICD-10-CM | POA: Diagnosis not present

## 2014-10-30 DIAGNOSIS — Z792 Long term (current) use of antibiotics: Secondary | ICD-10-CM | POA: Diagnosis not present

## 2014-10-30 MED ORDER — AZITHROMYCIN 250 MG PO TABS: 250.0000 mg | ORAL_TABLET | Freq: Every day | ORAL | Status: DC

## 2014-10-30 MED ORDER — FLUCONAZOLE 200 MG PO TABS: 200.0000 mg | ORAL_TABLET | Freq: Every day | ORAL | Status: AC

## 2014-10-30 MED ORDER — MAGIC MOUTHWASH W/LIDOCAINE: 5.0000 mL | Freq: Three times a day (TID) | ORAL | Status: DC | PRN

## 2014-10-30 MED ORDER — FLUCONAZOLE 100 MG PO TABS
200.0000 mg | ORAL_TABLET | Freq: Once | ORAL | Status: AC
  Administered 2014-10-30: 200 mg via ORAL
  Filled 2014-10-30: qty 2

## 2014-10-30 NOTE — ED Provider Notes (Signed)
CSN: [id number]     Arrival date & time 10/30/14  [id number] History   First MD Initiated Contact with Patient 10/30/14 [phone]     Chief Complaint  Patient presents with  . Sore Throat     (Consider location/radiation/quality/duration/timing/severity/associated sxs/prior Treatment) Patient is a 33 y.o. female presenting with pharyngitis. The history is provided by the patient.  Sore Throat This is a new problem. The current episode started in the past 7 days. The problem occurs constantly. The problem has been gradually worsening. Associated symptoms include a sore throat. Nausea: occasional due to IBS.   [place] Allyson SabalM Goleman is a 33 y.o. female who presents to the ED with a sore throat that started about a week ago. She complains of burning in her mouth. She states she has a hx of fever blisters and and started her Valtrex but it has not helped. She has had thrush before also on several occasions that requires Diflucan.   Past Medical History  Diagnosis Date  . Anxiety   . Fibromyalgia   . Restless leg   . Chronic back pain   . GERD (gastroesophageal reflux disease)   . Headache(784.0)     mygrains  . PONV (postoperative nausea and vomiting)   . Interstitial cystitis   . Polycystic ovarian syndrome   . Hiatal hernia   . Seizures   . IBS (irritable bowel syndrome)   . Chronic pain   . Chronic flank pain   . Kidney stones   . History of electroencephalogram 11/2013    normal EEG, "psychogenic nonepileptic spell" during EEG   Past Surgical History  Procedure Laterality Date  . Cholecystectomy    . Tonsillectomy    . Tubal ligation    . Tympanoplasty    . Esophagogastroduodenoscopy endoscopy     Family History  Problem Relation Age of Onset  . Hypertension Father   . Diabetes Father   . Cancer Other   . Diabetes Other   . Seizures Father    History  Substance Use Topics  . Smoking status: Current Every Day Smoker -- 1.00 packs/day for 15 years    Types: Cigarettes  .  Smokeless tobacco: Never Used  . Alcohol Use: Yes     Comment: rarely   OB History    Gravida Para Term Preterm AB TAB SAB Ectopic Multiple Living   2 2  2      2      Review of Systems  HENT: Positive for ear pain, mouth sores and sore throat.   Gastrointestinal: Nausea: occasional due to IBS.  all other systems negative    Allergies  Omnicef; Codeine; Sulfa antibiotics; Amoxicillin; and Penicillins  Home Medications   Prior to Admission medications   Medication Sig Start Date End Date Taking? Authorizing Provider  acetaminophen (TYLENOL) 500 MG tablet Take 1,500 mg by mouth every 8 (eight) hours as needed for moderate pain.   Yes Historical Provider, MD  Aspirin-Acetaminophen-Caffeine (GOODYS EXTRA STRENGTH) [phone] MG PACK Take 2 Packages by mouth every 6 (six) hours as needed (Pain).   Yes Historical Provider, MD  dexlansoprazole (DEXILANT) 60 MG capsule Take 60 mg by mouth daily as needed (for acid reflux).    Yes Historical Provider, MD  diazepam (VALIUM) 5 MG tablet Take 5 mg by mouth 3 (three) times daily. FOR SEIZURES 07/17/14  Yes Leroy SeaPrashant K Singh, MD  diphenhydramine-acetaminophen (TYLENOL PM) 25-500 MG TABS Take 1 tablet by mouth at bedtime as needed (for sleep).  Yes Historical Provider, MD  HYDROcodone-acetaminophen (NORCO/VICODIN) 5-325 MG per tablet Take 1 tablet by mouth every 4 (four) hours as needed. 10/10/14  Yes Raynelle Fanning Idol, PA-C  ibuprofen (ADVIL,MOTRIN) 200 MG tablet Take 800 mg by mouth every 6 (six) hours as needed for pain.    Yes Historical Provider, MD  iron polysaccharides (NIFEREX) 150 MG capsule Take 150 mg by mouth 2 (two) times daily.   Yes Historical Provider, MD  lacosamide (VIMPAT) 200 MG TABS tablet Take 200 mg by mouth 2 (two) times daily.   Yes Historical Provider, MD  lamoTRIgine (LAMICTAL) 25 MG tablet Take 25 mg by mouth 2 (two) times daily.   Yes Historical Provider, MD  omeprazole (PRILOSEC) 20 MG capsule Take 20 mg by mouth daily.   Yes  Historical Provider, MD  pramipexole (MIRAPEX) 1 MG tablet Take 1 mg by mouth at bedtime.   Yes Historical Provider, MD  promethazine (PHENERGAN) 25 MG tablet Take 25 mg by mouth every 6 (six) hours as needed for nausea or vomiting.   Yes Historical Provider, MD  QUEtiapine (SEROQUEL) 100 MG tablet Take 100-200 mg by mouth at bedtime.   Yes Historical Provider, MD  valACYclovir (VALTREX) 1000 MG tablet Take 1,000 mg by mouth daily.   Yes Historical Provider, MD  Vitamin D, Ergocalciferol, (DRISDOL) 50000 UNITS CAPS capsule Take 50,000 Units by mouth every 7 (seven) days. Takes on Wednesdays.   Yes Historical Provider, MD  Alum & Mag Hydroxide-Simeth (MAGIC MOUTHWASH W/LIDOCAINE) SOLN Take 5 mLs by mouth 3 (three) times daily as needed for mouth pain. 10/30/14   Hope Orlene Och, NP  azithromycin (ZITHROMAX) 250 MG tablet Take 1 tablet (250 mg total) by mouth daily. Take first 2 tablets together, then 1 every day until finished. 10/30/14   Hope Orlene Och, NP  dexamethasone (DECADRON) 6 MG tablet 1 po bid with food Patient not taking: Reported on 10/09/2014 09/20/14   Kathie Dike, PA-C  fluconazole (DIFLUCAN) 200 MG tablet Take 1 tablet (200 mg total) by mouth daily. 10/30/14 11/06/14  Hope Orlene Och, NP  ondansetron (ZOFRAN) 4 MG tablet Take 1 tablet (4 mg total) by mouth every 8 (eight) hours as needed for nausea or vomiting. Patient not taking: Reported on 10/09/2014 07/17/14   Leroy Sea, MD  predniSONE (DELTASONE) 20 MG tablet Take 1 tablet (20 mg total) by mouth 2 (two) times daily. Patient not taking: Reported on 10/30/2014 10/19/14   Flint Melter, MD   BP 139/97 mmHg  Pulse 97  Temp(Src) 98.1 F (36.7 C) (Oral)  Resp 18  Ht 5\' 6"  (1.676 m)  Wt 219 lb (99.338 kg)  BMI 35.36 kg/m2  SpO2 100%  LMP 10/24/2014 Physical Exam  Constitutional: She is oriented to person, place, and time. She appears well-developed and well-nourished. No distress.  HENT:  Head: Normocephalic.  Right Ear:  Tympanic membrane is scarred and erythematous.  Left Ear: Tympanic membrane normal.  Nose: Rhinorrhea present.  Mouth/Throat: Uvula is midline. Oral lesions present. Posterior oropharyngeal erythema present.  Multiple oral ulcers noted. White patches to the tongue.   Eyes: EOM are normal.  Neck: Neck supple.  Cardiovascular: Normal rate.   Pulmonary/Chest: Effort normal.  Abdominal: Soft. There is no tenderness.  Musculoskeletal: Normal range of motion.  Neurological: She is alert and oriented to person, place, and time. No cranial nerve deficit.  Skin: Skin is warm and dry.  Psychiatric: She has a normal mood and affect. Her behavior is normal.  Nursing note and vitals reviewed.   ED Course  Procedures (including critical care time) Labs Review Labs Reviewed - No data to display  Imaging Review No results found.   EKG Interpretation None      MDM  33 y.o. female with oral ulcers, coated tongue, right ear pain and sore throat. Hx of thrush and fever blisters. Currently taking Valtrex. Will treat for otitis media, oral ulcers and thrush and she will continue Valtrex. Stable for discharge without difficulty swallowing. Discussed elevated BP which patient says is never elevated when she goes to her PCP, only here. Discussed with the patient clinical findings and plan of care and all questioned fully answered. She will follow up with her PCP or return here if any problems arise.    Medication List    TAKE these medications        azithromycin 250 MG tablet  Commonly known as:  ZITHROMAX  Take 1 tablet (250 mg total) by mouth daily. Take first 2 tablets together, then 1 every day until finished.     fluconazole 200 MG tablet  Commonly known as:  DIFLUCAN  Take 1 tablet (200 mg total) by mouth daily.     magic mouthwash w/lidocaine Soln  Take 5 mLs by mouth 3 (three) times daily as needed for mouth pain.      ASK your doctor about these medications        acetaminophen  500 MG tablet  Commonly known as:  TYLENOL  Take 1,500 mg by mouth every 8 (eight) hours as needed for moderate pain.     dexamethasone 6 MG tablet  Commonly known as:  DECADRON  1 po bid with food     dexlansoprazole 60 MG capsule  Commonly known as:  DEXILANT  Take 60 mg by mouth daily as needed (for acid reflux).     diazepam 5 MG tablet  Commonly known as:  VALIUM  Take 5 mg by mouth 3 (three) times daily. FOR SEIZURES     diphenhydramine-acetaminophen 25-500 MG Tabs  Commonly known as:  TYLENOL PM  Take 1 tablet by mouth at bedtime as needed (for sleep).     GOODYS EXTRA STRENGTH 500-325-65 MG Pack  Generic drug:  Aspirin-Acetaminophen-Caffeine  Take 2 Packages by mouth every 6 (six) hours as needed (Pain).     HYDROcodone-acetaminophen 5-325 MG per tablet  Commonly known as:  NORCO/VICODIN  Take 1 tablet by mouth every 4 (four) hours as needed.     ibuprofen 200 MG tablet  Commonly known as:  ADVIL,MOTRIN  Take 800 mg by mouth every 6 (six) hours as needed for pain.     iron polysaccharides 150 MG capsule  Commonly known as:  NIFEREX  Take 150 mg by mouth 2 (two) times daily.     lacosamide 200 MG Tabs tablet  Commonly known as:  VIMPAT  Take 200 mg by mouth 2 (two) times daily.     lamoTRIgine 25 MG tablet  Commonly known as:  LAMICTAL  Take 25 mg by mouth 2 (two) times daily.     omeprazole 20 MG capsule  Commonly known as:  PRILOSEC  Take 20 mg by mouth daily.     ondansetron 4 MG tablet  Commonly known as:  ZOFRAN  Take 1 tablet (4 mg total) by mouth every 8 (eight) hours as needed for nausea or vomiting.     pramipexole 1 MG tablet  Commonly known as:  MIRAPEX  Take 1 mg by  mouth at bedtime.     predniSONE 20 MG tablet  Commonly known as:  DELTASONE  Take 1 tablet (20 mg total) by mouth 2 (two) times daily.     promethazine 25 MG tablet  Commonly known as:  PHENERGAN  Take 25 mg by mouth every 6 (six) hours as needed for nausea or vomiting.      QUEtiapine 100 MG tablet  Commonly known as:  SEROQUEL  Take 100-200 mg by mouth at bedtime.     valACYclovir 1000 MG tablet  Commonly known as:  VALTREX  Take 1,000 mg by mouth daily.     Vitamin D (Ergocalciferol) 50000 UNITS Caps capsule  Commonly known as:  DRISDOL  Take 50,000 Units by mouth every 7 (seven) days. Takes on Wednesdays.        Final diagnoses:  Recurrent oral ulcers  Oral thrush  Right acute serous otitis media, recurrence not specified       Janne Napoleon, NP 10/30/14 1046  Raeford Razor, MD 10/30/14 1527

## 2014-10-30 NOTE — Discharge Instructions (Signed)
Follow up with your primary care doctor for your elevated BP and your oral ulcers and thrush. Return here as needed.

## 2014-10-30 NOTE — ED Notes (Signed)
Patient complaining of sore throat at "burning in my mouth" x 1 week.

## 2014-11-01 ENCOUNTER — Emergency Department (HOSPITAL_COMMUNITY)
Admission: EM | Admit: 2014-11-01 | Discharge: 2014-11-01 | Disposition: A | Discharge status: Home / Self Care | Payer: Medicaid Other | Attending: Emergency Medicine | Admitting: Emergency Medicine

## 2014-11-01 DIAGNOSIS — Z87442 Personal history of urinary calculi: Secondary | ICD-10-CM | POA: Insufficient documentation

## 2014-11-01 DIAGNOSIS — G8929 Other chronic pain: Secondary | ICD-10-CM | POA: Diagnosis not present

## 2014-11-01 DIAGNOSIS — G40919 Epilepsy, unspecified, intractable, without status epilepticus: Secondary | ICD-10-CM

## 2014-11-01 DIAGNOSIS — R569 Unspecified convulsions: Secondary | ICD-10-CM | POA: Diagnosis present

## 2014-11-01 DIAGNOSIS — Z7982 Long term (current) use of aspirin: Secondary | ICD-10-CM | POA: Diagnosis not present

## 2014-11-01 DIAGNOSIS — G40909 Epilepsy, unspecified, not intractable, without status epilepticus: Secondary | ICD-10-CM | POA: Diagnosis not present

## 2014-11-01 DIAGNOSIS — M797 Fibromyalgia: Secondary | ICD-10-CM | POA: Diagnosis not present

## 2014-11-01 DIAGNOSIS — Z7952 Long term (current) use of systemic steroids: Secondary | ICD-10-CM | POA: Insufficient documentation

## 2014-11-01 DIAGNOSIS — K219 Gastro-esophageal reflux disease without esophagitis: Secondary | ICD-10-CM | POA: Diagnosis not present

## 2014-11-01 DIAGNOSIS — Z720 Tobacco use: Secondary | ICD-10-CM | POA: Diagnosis not present

## 2014-11-01 DIAGNOSIS — F419 Anxiety disorder, unspecified: Secondary | ICD-10-CM | POA: Diagnosis not present

## 2014-11-01 DIAGNOSIS — Z79899 Other long term (current) drug therapy: Secondary | ICD-10-CM | POA: Diagnosis not present

## 2014-11-01 DIAGNOSIS — Z880 Allergy status to penicillin: Secondary | ICD-10-CM | POA: Diagnosis not present

## 2014-11-01 LAB — BASIC METABOLIC PANEL
Anion gap: 16 — ABNORMAL HIGH (ref 5–15)
BUN: 17 mg/dL (ref 6–23)
CHLORIDE: 99 meq/L (ref 96–112)
CO2: 22 mEq/L (ref 19–32)
Calcium: 9 mg/dL (ref 8.4–10.5)
Creatinine, Ser: 0.99 mg/dL (ref 0.50–1.10)
GFR calc Af Amer: 85 mL/min — ABNORMAL LOW (ref >90)
GFR calc non Af Amer: 74 mL/min — ABNORMAL LOW (ref >90)
GLUCOSE: 116 mg/dL — AB (ref 70–99)
POTASSIUM: 3.7 meq/L (ref 3.7–5.3)
SODIUM: 137 meq/L (ref 137–147)

## 2014-11-01 LAB — CBC
HCT: 40.9 % (ref 36.0–46.0)
Hemoglobin: 13.7 g/dL (ref 12.0–15.0)
MCH: 28.4 pg (ref 26.0–34.0)
MCHC: 33.5 g/dL (ref 30.0–36.0)
MCV: 84.9 fL (ref 78.0–100.0)
Platelets: 290 10*3/uL (ref 150–400)
RBC: 4.82 MIL/uL (ref 3.87–5.11)
RDW: 15.1 % (ref 11.5–15.5)
WBC: 18.2 10*3/uL — ABNORMAL HIGH (ref 4.0–10.5)

## 2014-11-01 LAB — CBG MONITORING, ED: GLUCOSE-CAPILLARY: 130 mg/dL — AB (ref 70–99)

## 2014-11-01 MED ORDER — KETOROLAC TROMETHAMINE 30 MG/ML IJ SOLN
INTRAMUSCULAR | Status: AC
  Filled 2014-11-01: qty 1

## 2014-11-01 MED ORDER — KETOROLAC TROMETHAMINE 30 MG/ML IJ SOLN
30.0000 mg | Freq: Once | INTRAMUSCULAR | Status: AC
  Administered 2014-11-01: 30 mg via INTRAVENOUS

## 2014-11-01 NOTE — Discharge Instructions (Signed)

## 2014-11-01 NOTE — ED Provider Notes (Signed)
CSN: [id number]     Arrival date & time 11/01/14  0211 History   First MD Initiated Contact with Patient 11/01/14 0209     Chief Complaint  Patient presents with  . Seizures    witnessed seizure at home     (Consider location/radiation/quality/duration/timing/severity/associated sxs/prior Treatment) HPI Comments: Patient is a 33 year old female well-known to the ED for recurrent seizures.  She presents with complaints of seizure. She states she had a verbal altercation with her daughter this evening and shortly afterward experienced an additional seizure. She describes this as shaking all over. She was brought by EMS and was given a milligram of Ativan in route. She complains of headache and feels sore but denies other complaints.  Patient is a 33 y.o. female presenting with seizures. The history is provided by the patient.  Seizures Seizure activity on arrival: no   Seizure type:  Grand mal Preceding symptoms: no sensation of an aura present   Initial focality:  None Episode characteristics: generalized shaking   Postictal symptoms: confusion   Return to baseline: yes   Severity:  Moderate Duration:  5 minutes   Past Medical History  Diagnosis Date  . Anxiety   . Fibromyalgia   . Restless leg   . Chronic back pain   . GERD (gastroesophageal reflux disease)   . Headache(784.0)     mygrains  . PONV (postoperative nausea and vomiting)   . Interstitial cystitis   . Polycystic ovarian syndrome   . Hiatal hernia   . Seizures   . IBS (irritable bowel syndrome)   . Chronic pain   . Chronic flank pain   . Kidney stones   . History of electroencephalogram 11/2013    normal EEG, "psychogenic nonepileptic spell" during EEG   Past Surgical History  Procedure Laterality Date  . Cholecystectomy    . Tonsillectomy    . Tubal ligation    . Tympanoplasty    . Esophagogastroduodenoscopy endoscopy     Family History  Problem Relation Age of Onset  . Hypertension Father   .  Diabetes Father   . Cancer Other   . Diabetes Other   . Seizures Father    History  Substance Use Topics  . Smoking status: Current Every Day Smoker -- 1.00 packs/day for 15 years    Types: Cigarettes  . Smokeless tobacco: Never Used  . Alcohol Use: Yes     Comment: rarely   OB History    Gravida Para Term Preterm AB TAB SAB Ectopic Multiple Living   2 2  2      2      Review of Systems  Neurological: Positive for seizures.  All other systems reviewed and are negative.     Allergies  Omnicef; Codeine; Sulfa antibiotics; Amoxicillin; and Penicillins  Home Medications   Prior to Admission medications   Medication Sig Start Date End Date Taking? Authorizing Provider  acetaminophen (TYLENOL) 500 MG tablet Take 1,500 mg by mouth every 8 (eight) hours as needed for moderate pain.    Historical Provider, MD  Alum & Mag Hydroxide-Simeth (MAGIC MOUTHWASH W/LIDOCAINE) SOLN Take 5 mLs by mouth 3 (three) times daily as needed for mouth pain. 10/30/14   Hope Orlene OchM Neese, NP  Aspirin-Acetaminophen-Caffeine (GOODYS EXTRA STRENGTH) [phone] MG PACK Take 2 Packages by mouth every 6 (six) hours as needed (Pain).    Historical Provider, MD  azithromycin (ZITHROMAX) 250 MG tablet Take 1 tablet (250 mg total) by mouth daily. Take first  2 tablets together, then 1 every day until finished. 10/30/14   Hope Orlene OchM Neese, NP  dexamethasone (DECADRON) 6 MG tablet 1 po bid with food Patient not taking: Reported on 10/09/2014 09/20/14   Kathie DikeHobson M Bryant, PA-C  dexlansoprazole (DEXILANT) 60 MG capsule Take 60 mg by mouth daily as needed (for acid reflux).     Historical Provider, MD  diazepam (VALIUM) 5 MG tablet Take 5 mg by mouth 3 (three) times daily. FOR SEIZURES 07/17/14   Leroy SeaPrashant K Singh, MD  diphenhydramine-acetaminophen (TYLENOL PM) 25-500 MG TABS Take 1 tablet by mouth at bedtime as needed (for sleep).     Historical Provider, MD  fluconazole (DIFLUCAN) 200 MG tablet Take 1 tablet (200 mg total) by mouth  daily. 10/30/14 11/06/14  Hope Orlene OchM Neese, NP  HYDROcodone-acetaminophen (NORCO/VICODIN) 5-325 MG per tablet Take 1 tablet by mouth every 4 (four) hours as needed. 10/10/14   Burgess AmorJulie Idol, PA-C  ibuprofen (ADVIL,MOTRIN) 200 MG tablet Take 800 mg by mouth every 6 (six) hours as needed for pain.     Historical Provider, MD  iron polysaccharides (NIFEREX) 150 MG capsule Take 150 mg by mouth 2 (two) times daily.    Historical Provider, MD  lacosamide (VIMPAT) 200 MG TABS tablet Take 200 mg by mouth 2 (two) times daily.    Historical Provider, MD  lamoTRIgine (LAMICTAL) 25 MG tablet Take 25 mg by mouth 2 (two) times daily.    Historical Provider, MD  omeprazole (PRILOSEC) 20 MG capsule Take 20 mg by mouth daily.    Historical Provider, MD  ondansetron (ZOFRAN) 4 MG tablet Take 1 tablet (4 mg total) by mouth every 8 (eight) hours as needed for nausea or vomiting. Patient not taking: Reported on 10/09/2014 07/17/14   Leroy SeaPrashant K Singh, MD  pramipexole (MIRAPEX) 1 MG tablet Take 1 mg by mouth at bedtime.    Historical Provider, MD  predniSONE (DELTASONE) 20 MG tablet Take 1 tablet (20 mg total) by mouth 2 (two) times daily. Patient not taking: Reported on 10/30/2014 10/19/14   Flint MelterElliott L Wentz, MD  promethazine (PHENERGAN) 25 MG tablet Take 25 mg by mouth every 6 (six) hours as needed for nausea or vomiting.    Historical Provider, MD  QUEtiapine (SEROQUEL) 100 MG tablet Take 100-200 mg by mouth at bedtime.    Historical Provider, MD  valACYclovir (VALTREX) 1000 MG tablet Take 1,000 mg by mouth daily.    Historical Provider, MD  Vitamin D, Ergocalciferol, (DRISDOL) 50000 UNITS CAPS capsule Take 50,000 Units by mouth every 7 (seven) days. Takes on Wednesdays.    Historical Provider, MD   BP 104/82 mmHg  Pulse 95  Temp(Src) 98.7 F (37.1 C) (Oral)  Resp 17  Ht 5\' 6"  (1.676 m)  Wt 219 lb (99.338 kg)  BMI 35.36 kg/m2  SpO2 93%  LMP 10/24/2014 Physical Exam  Constitutional: She is oriented to person, place,  and time. She appears well-developed and well-nourished. No distress.  HENT:  Head: Normocephalic and atraumatic.  Eyes: EOM are normal. Pupils are equal, round, and reactive to light.  Neck: Normal range of motion. Neck supple.  Cardiovascular: Normal rate and regular rhythm.  Exam reveals no gallop and no friction rub.   No murmur heard. Pulmonary/Chest: Effort normal and breath sounds normal. No respiratory distress. She has no wheezes.  Abdominal: Soft. Bowel sounds are normal. She exhibits no distension. There is no tenderness.  Musculoskeletal: Normal range of motion.  Neurological: She is alert and oriented to person,  place, and time. No cranial nerve deficit. She exhibits normal muscle tone. Coordination normal.  Skin: Skin is warm and dry. She is not diaphoretic.  Nursing note and vitals reviewed.   ED Course  Procedures (including critical care time) Labs Review Labs Reviewed  CBC - Abnormal; Notable for the following:    WBC 18.2 (*)    All other components within normal limits  BASIC METABOLIC PANEL - Abnormal; Notable for the following:    Glucose, Bld 116 (*)    GFR calc non Af Amer 74 (*)    GFR calc Af Amer 85 (*)    Anion gap 16 (*)    All other components within normal limits  CBG MONITORING, ED - Abnormal; Notable for the following:    Glucose-Capillary 130 (*)    All other components within normal limits  CBG MONITORING, ED    Imaging Review No results found.   EKG Interpretation   Date/Time:  Sunday November 01 2014 01:59:28 EST Ventricular Rate:  101 PR Interval:  154 QRS Duration: 81 QT Interval:  337 QTC Calculation: 437 R Axis:   38 Text Interpretation:  Sinus tachycardia Borderline T abnormalities,  diffuse leads Confirmed by DELOS  MD, Kaley Jutras ([id number]) on 11/01/2014  2:53:09 AM      MDM   Final diagnoses:  None    Labs are unremarkable and the patient is now at her baseline. I feel as though she is appropriate for discharge. She has  an appointment with a new neurologist in the near future and I have advised her to keep this appointment.    Geoffery Lyons, MD 11/01/14 [phone]

## 2014-11-01 NOTE — ED Notes (Signed)
Witnessed seizure at home. Medicated with ativan 1mg   IV prior to arrival at [id number]

## 2014-11-29 ENCOUNTER — Emergency Department (HOSPITAL_COMMUNITY)
Admission: EM | Admit: 2014-11-29 | Discharge: 2014-11-29 | Disposition: A | Discharge status: Home / Self Care | Payer: Medicaid Other | Attending: Emergency Medicine | Admitting: Emergency Medicine

## 2014-11-29 ENCOUNTER — Encounter (HOSPITAL_COMMUNITY): Payer: Self-pay | Admitting: *Deleted

## 2014-11-29 DIAGNOSIS — Z87442 Personal history of urinary calculi: Secondary | ICD-10-CM | POA: Diagnosis not present

## 2014-11-29 DIAGNOSIS — Z79899 Other long term (current) drug therapy: Secondary | ICD-10-CM | POA: Diagnosis not present

## 2014-11-29 DIAGNOSIS — G2581 Restless legs syndrome: Secondary | ICD-10-CM | POA: Insufficient documentation

## 2014-11-29 DIAGNOSIS — F419 Anxiety disorder, unspecified: Secondary | ICD-10-CM | POA: Insufficient documentation

## 2014-11-29 DIAGNOSIS — G8929 Other chronic pain: Secondary | ICD-10-CM | POA: Insufficient documentation

## 2014-11-29 DIAGNOSIS — M797 Fibromyalgia: Secondary | ICD-10-CM | POA: Diagnosis not present

## 2014-11-29 DIAGNOSIS — K219 Gastro-esophageal reflux disease without esophagitis: Secondary | ICD-10-CM | POA: Insufficient documentation

## 2014-11-29 DIAGNOSIS — M545 Low back pain: Secondary | ICD-10-CM | POA: Diagnosis present

## 2014-11-29 DIAGNOSIS — Z8742 Personal history of other diseases of the female genital tract: Secondary | ICD-10-CM | POA: Diagnosis not present

## 2014-11-29 DIAGNOSIS — Z720 Tobacco use: Secondary | ICD-10-CM | POA: Insufficient documentation

## 2014-11-29 DIAGNOSIS — Z7952 Long term (current) use of systemic steroids: Secondary | ICD-10-CM | POA: Insufficient documentation

## 2014-11-29 DIAGNOSIS — Z87448 Personal history of other diseases of urinary system: Secondary | ICD-10-CM | POA: Insufficient documentation

## 2014-11-29 DIAGNOSIS — M5441 Lumbago with sciatica, right side: Secondary | ICD-10-CM | POA: Diagnosis not present

## 2014-11-29 DIAGNOSIS — G40909 Epilepsy, unspecified, not intractable, without status epilepticus: Secondary | ICD-10-CM | POA: Diagnosis not present

## 2014-11-29 DIAGNOSIS — Z880 Allergy status to penicillin: Secondary | ICD-10-CM | POA: Diagnosis not present

## 2014-11-29 MED ORDER — CYCLOBENZAPRINE HCL 10 MG PO TABS
10.0000 mg | ORAL_TABLET | Freq: Once | ORAL | Status: AC
  Administered 2014-11-29: 10 mg via ORAL
  Filled 2014-11-29: qty 1

## 2014-11-29 MED ORDER — CYCLOBENZAPRINE HCL 10 MG PO TABS: 10.0000 mg | ORAL_TABLET | Freq: Three times a day (TID) | ORAL | Status: DC | PRN

## 2014-11-29 MED ORDER — KETOROLAC TROMETHAMINE 30 MG/ML IJ SOLN
60.0000 mg | Freq: Once | INTRAMUSCULAR | Status: AC
  Administered 2014-11-29: 60 mg via INTRAMUSCULAR
  Filled 2014-11-29: qty 2

## 2014-11-29 NOTE — ED Provider Notes (Signed)
CSN: [id number]     Arrival date & time 11/29/14  0007 History   First MD Initiated Contact with Patient 11/29/14 0210     Chief Complaint  Patient presents with  . Back Pain     (Consider location/radiation/quality/duration/timing/severity/associated sxs/prior Treatment) HPI Comments: Patient is a 34 year old female with history of seizures, chronic low back pain, anxiety, fibromyalgia, polycystic ovaries. She presents with complaints of low back pain in the absence of any injury or trauma. She reports the pain radiates into her right buttock and thigh, but denies any weakness or bowel or bladder complaints.  Patient is a 34 y.o. female presenting with back pain. The history is provided by the patient.  Back Pain Location:  Lumbar spine Quality:  Stabbing Radiates to:  R thigh Pain severity:  Moderate Pain is:  Same all the time Timing:  Constant Progression:  Worsening Chronicity:  Chronic Context: not falling   Relieved by:  Nothing Worsened by:  Bending, movement and palpation   Past Medical History  Diagnosis Date  . Anxiety   . Fibromyalgia   . Restless leg   . Chronic back pain   . GERD (gastroesophageal reflux disease)   . Headache(784.0)     mygrains  . PONV (postoperative nausea and vomiting)   . Interstitial cystitis   . Polycystic ovarian syndrome   . Hiatal hernia   . Seizures   . IBS (irritable bowel syndrome)   . Chronic pain   . Chronic flank pain   . Kidney stones   . History of electroencephalogram 11/2013    normal EEG, "psychogenic nonepileptic spell" during EEG   Past Surgical History  Procedure Laterality Date  . Cholecystectomy    . Tonsillectomy    . Tubal ligation    . Tympanoplasty    . Esophagogastroduodenoscopy endoscopy     Family History  Problem Relation Age of Onset  . Hypertension Father   . Diabetes Father   . Cancer Other   . Diabetes Other   . Seizures Father    History  Substance Use Topics  . Smoking status:  Current Every Day Smoker -- 1.00 packs/day for 15 years    Types: Cigarettes  . Smokeless tobacco: Never Used  . Alcohol Use: Yes     Comment: rarely   OB History    Gravida Para Term Preterm AB TAB SAB Ectopic Multiple Living   2 2  2      2      Review of Systems  Musculoskeletal: Positive for back pain.  All other systems reviewed and are negative.     Allergies  Omnicef; Codeine; Sulfa antibiotics; Amoxicillin; and Penicillins  Home Medications   Prior to Admission medications   Medication Sig Start Date End Date Taking? Authorizing Provider  acetaminophen (TYLENOL) 500 MG tablet Take 1,500 mg by mouth every 8 (eight) hours as needed for moderate pain.    Historical Provider, MD  Alum & Mag Hydroxide-Simeth (MAGIC MOUTHWASH W/LIDOCAINE) SOLN Take 5 mLs by mouth 3 (three) times daily as needed for mouth pain. 10/30/14   Hope Orlene OchM Neese, NP  Aspirin-Acetaminophen-Caffeine (GOODYS EXTRA STRENGTH) [id number] MG PACK Take 2 Packages by mouth every 6 (six) hours as needed (Pain).    Historical Provider, MD  azithromycin (ZITHROMAX) 250 MG tablet Take 1 tablet (250 mg total) by mouth daily. Take first 2 tablets together, then 1 every day until finished. 10/30/14   Hope Orlene OchM Neese, NP  dexamethasone (DECADRON) 6 MG tablet 1 po  bid with food Patient not taking: Reported on 10/09/2014 09/20/14   Kathie Dike, PA-C  dexlansoprazole (DEXILANT) 60 MG capsule Take 60 mg by mouth daily as needed (for acid reflux).     Historical Provider, MD  diazepam (VALIUM) 5 MG tablet Take 5 mg by mouth 3 (three) times daily. FOR SEIZURES 07/17/14   Leroy Sea, MD  diphenhydramine-acetaminophen (TYLENOL PM) 25-500 MG TABS Take 1 tablet by mouth at bedtime as needed (for sleep).     Historical Provider, MD  HYDROcodone-acetaminophen (NORCO/VICODIN) 5-325 MG per tablet Take 1 tablet by mouth every 4 (four) hours as needed. 10/10/14   Burgess Amor, PA-C  ibuprofen (ADVIL,MOTRIN) 200 MG tablet Take 800 mg by  mouth every 6 (six) hours as needed for pain.     Historical Provider, MD  iron polysaccharides (NIFEREX) 150 MG capsule Take 150 mg by mouth 2 (two) times daily.    Historical Provider, MD  lacosamide (VIMPAT) 200 MG TABS tablet Take 200 mg by mouth 2 (two) times daily.    Historical Provider, MD  lamoTRIgine (LAMICTAL) 25 MG tablet Take 25 mg by mouth 2 (two) times daily.    Historical Provider, MD  omeprazole (PRILOSEC) 20 MG capsule Take 20 mg by mouth daily.    Historical Provider, MD  ondansetron (ZOFRAN) 4 MG tablet Take 1 tablet (4 mg total) by mouth every 8 (eight) hours as needed for nausea or vomiting. Patient not taking: Reported on 10/09/2014 07/17/14   Leroy Sea, MD  pramipexole (MIRAPEX) 1 MG tablet Take 1 mg by mouth at bedtime.    Historical Provider, MD  predniSONE (DELTASONE) 20 MG tablet Take 1 tablet (20 mg total) by mouth 2 (two) times daily. Patient not taking: Reported on 10/30/2014 10/19/14   Flint Melter, MD  promethazine (PHENERGAN) 25 MG tablet Take 25 mg by mouth every 6 (six) hours as needed for nausea or vomiting.    Historical Provider, MD  QUEtiapine (SEROQUEL) 100 MG tablet Take 100-200 mg by mouth at bedtime.    Historical Provider, MD  valACYclovir (VALTREX) 1000 MG tablet Take 1,000 mg by mouth daily.    Historical Provider, MD  Vitamin D, Ergocalciferol, (DRISDOL) 50000 UNITS CAPS capsule Take 50,000 Units by mouth every 7 (seven) days. Takes on Wednesdays.    Historical Provider, MD   BP 146/98 mmHg  Pulse 96  Temp(Src) 98.7 F (37.1 C) (Oral)  Resp 20  Ht  (1.702 m)  Wt 214 lb (97.07 kg)  BMI 33.51 kg/m2  SpO2 100%  LMP 11/24/2014 Physical Exam  Constitutional: She is oriented to person, place, and time. She appears well-developed and well-nourished. No distress.  HENT:  Head: Normocephalic and atraumatic.  Neck: Normal range of motion. Neck supple.  Musculoskeletal: Normal range of motion.  There is tenderness to palpation in the  soft tissues of the lumbar region.  Neurological: She is alert and oriented to person, place, and time.  DTRs are trace and equal in the bilateral lower extremities. Strength is 5 out of 5 in the bilateral lower extremities. She is ambulatory without difficulty.  Skin: Skin is warm and dry. She is not diaphoretic.  Nursing note and vitals reviewed.   ED Course  Procedures (including critical care time) Labs Review Labs Reviewed - No data to display  Imaging Review No results found.   EKG Interpretation None      MDM   Final diagnoses:  None    Patient well known  to the ER for seizure disorder and chronic back pain. She presents today with an exacerbation of her low back pain. There are no red flags to suggest an emergent situation. Her reflexes and strength are symmetrical and there are no bowel or bladder complaints. I believe she is appropriate for discharge. I will recommend ibuprofen and prescribed Flexeril which she can take as needed. She is to follow-up with her primary Dr. this week.    Geoffery Lyons, MD 11/29/14 [phone]

## 2014-11-29 NOTE — Discharge Instructions (Signed)
Ibuprofen 600 mg 3 times daily for the next 5 days.  Flexeril as prescribed as needed for pain not relieved with ibuprofen.  Follow-up with your primary Dr. in the next week if not improving.   Back Pain, Adult Back pain is very common. The pain often gets better over time. The cause of back pain is usually not dangerous. Most people can learn to manage their back pain on their own.  HOME CARE   Stay active. Start with short walks on flat ground if you can. Try to walk farther each day.  Do not sit, drive, or stand in one place for more than 30 minutes. Do not stay in bed.  Do not avoid exercise or work. Activity can help your back heal faster.  Be careful when you bend or lift an object. Bend at your knees, keep the object close to you, and do not twist.  Sleep on a firm mattress. Lie on your side, and bend your knees. If you lie on your back, put a pillow under your knees.  Only take medicines as told by your doctor.  Put ice on the injured area.  Put ice in a plastic bag.  Place a towel between your skin and the bag.  Leave the ice on for 15-20 minutes, 03-04 times a day for the first 2 to 3 days. After that, you can switch between ice and heat packs.  Ask your doctor about back exercises or massage.  Avoid feeling anxious or stressed. Find good ways to deal with stress, such as exercise. GET HELP RIGHT AWAY IF:   Your pain does not go away with rest or medicine.  Your pain does not go away in 1 week.  You have new problems.  You do not feel well.  The pain spreads into your legs.  You cannot control when you poop (bowel movement) or pee (urinate).  Your arms or legs feel weak or lose feeling (numbness).  You feel sick to your stomach (nauseous) or throw up (vomit).  You have belly (abdominal) pain.  You feel like you may pass out (faint). MAKE SURE YOU:   Understand these instructions.  Will watch your condition.  Will get help right away if you are  not doing well or get worse. Document Released: 04/24/2008 Document Revised: 01/29/2012 Document Reviewed: 03/10/2014 [facility]Exit[facility] Patient Information 2015 [place], [place]. This information is not intended to replace advice given to you by your health care provider. Make sure you discuss any questions you have with your health care provider.

## 2014-11-29 NOTE — ED Notes (Signed)
Pt states she has fibromyalgia and is c/o back pain and bilateral leg pain that started yesterday

## 2014-12-25 ENCOUNTER — Encounter (HOSPITAL_COMMUNITY): Payer: Self-pay | Admitting: Emergency Medicine

## 2014-12-25 ENCOUNTER — Emergency Department (HOSPITAL_COMMUNITY)
Admission: EM | Admit: 2014-12-25 | Discharge: 2014-12-25 | Disposition: A | Discharge status: Home / Self Care | Payer: Medicaid Other | Attending: Emergency Medicine | Admitting: Emergency Medicine

## 2014-12-25 DIAGNOSIS — Y998 Other external cause status: Secondary | ICD-10-CM | POA: Insufficient documentation

## 2014-12-25 DIAGNOSIS — Z87442 Personal history of urinary calculi: Secondary | ICD-10-CM | POA: Diagnosis not present

## 2014-12-25 DIAGNOSIS — S3992XA Unspecified injury of lower back, initial encounter: Secondary | ICD-10-CM | POA: Diagnosis present

## 2014-12-25 DIAGNOSIS — G40909 Epilepsy, unspecified, not intractable, without status epilepticus: Secondary | ICD-10-CM | POA: Diagnosis not present

## 2014-12-25 DIAGNOSIS — Z720 Tobacco use: Secondary | ICD-10-CM | POA: Insufficient documentation

## 2014-12-25 DIAGNOSIS — G8929 Other chronic pain: Secondary | ICD-10-CM | POA: Diagnosis not present

## 2014-12-25 DIAGNOSIS — Z79899 Other long term (current) drug therapy: Secondary | ICD-10-CM | POA: Diagnosis not present

## 2014-12-25 DIAGNOSIS — Z880 Allergy status to penicillin: Secondary | ICD-10-CM | POA: Insufficient documentation

## 2014-12-25 DIAGNOSIS — G2581 Restless legs syndrome: Secondary | ICD-10-CM | POA: Insufficient documentation

## 2014-12-25 DIAGNOSIS — X58XXXA Exposure to other specified factors, initial encounter: Secondary | ICD-10-CM | POA: Insufficient documentation

## 2014-12-25 DIAGNOSIS — Y9289 Other specified places as the place of occurrence of the external cause: Secondary | ICD-10-CM | POA: Insufficient documentation

## 2014-12-25 DIAGNOSIS — Z8639 Personal history of other endocrine, nutritional and metabolic disease: Secondary | ICD-10-CM | POA: Insufficient documentation

## 2014-12-25 DIAGNOSIS — F419 Anxiety disorder, unspecified: Secondary | ICD-10-CM | POA: Diagnosis not present

## 2014-12-25 DIAGNOSIS — Y9389 Activity, other specified: Secondary | ICD-10-CM | POA: Diagnosis not present

## 2014-12-25 DIAGNOSIS — Z87448 Personal history of other diseases of urinary system: Secondary | ICD-10-CM | POA: Insufficient documentation

## 2014-12-25 DIAGNOSIS — S39012A Strain of muscle, fascia and tendon of lower back, initial encounter: Secondary | ICD-10-CM | POA: Diagnosis not present

## 2014-12-25 DIAGNOSIS — Z8719 Personal history of other diseases of the digestive system: Secondary | ICD-10-CM | POA: Insufficient documentation

## 2014-12-25 MED ORDER — METHOCARBAMOL 500 MG PO TABS: 500.0000 mg | ORAL_TABLET | Freq: Three times a day (TID) | ORAL | Status: DC

## 2014-12-25 MED ORDER — CELECOXIB 100 MG PO CAPS: 100.0000 mg | ORAL_CAPSULE | Freq: Two times a day (BID) | ORAL | Status: DC

## 2014-12-25 NOTE — ED Provider Notes (Signed)
CSN: [id number]     Arrival date & time 12/25/14  1258 History   First MD Initiated Contact with Patient 12/25/14 1327     Chief Complaint  Patient presents with  . Back Pain     (Consider location/radiation/quality/duration/timing/severity/associated sxs/prior Treatment) HPI Comments: Patient is a 34 year old female who presents to the emergency department with lower back pain. The patient states that on Wednesday, February 3 she was lifting her daughter (preteen) after a fall/injury and she heard or felt something pop in her back. She states she's been having pain since that time. She's been trying ice, Tylenol, and ibuprofen without success. She was unable to see her primary physician, and came to the emergency department for additional evaluation and management.  Patient is a 34 y.o. female presenting with back pain. The history is provided by the patient.  Back Pain Associated symptoms: no abdominal pain, no chest pain and no dysuria     Past Medical History  Diagnosis Date  . Anxiety   . Fibromyalgia   . Restless leg   . Chronic back pain   . GERD (gastroesophageal reflux disease)   . Headache(784.0)     mygrains  . PONV (postoperative nausea and vomiting)   . Interstitial cystitis   . Polycystic ovarian syndrome   . Hiatal hernia   . Seizures   . IBS (irritable bowel syndrome)   . Chronic pain   . Chronic flank pain   . Kidney stones   . History of electroencephalogram 11/2013    normal EEG, "psychogenic nonepileptic spell" during EEG   Past Surgical History  Procedure Laterality Date  . Cholecystectomy    . Tonsillectomy    . Tubal ligation    . Tympanoplasty    . Esophagogastroduodenoscopy endoscopy     Family History  Problem Relation Age of Onset  . Hypertension Father   . Diabetes Father   . Cancer Other   . Diabetes Other   . Seizures Father    History  Substance Use Topics  . Smoking status: Current Every Day Smoker -- 1.00 packs/day for 15 years    Types: Cigarettes  . Smokeless tobacco: Never Used  . Alcohol Use: Yes     Comment: rarely   OB History    Gravida Para Term Preterm AB TAB SAB Ectopic Multiple Living   Review of Systems  Constitutional: Negative for activity change.       All ROS Neg except as noted in HPI  HENT: Negative for nosebleeds.   Eyes: Negative for photophobia and discharge.  Respiratory: Negative for cough, shortness of breath and wheezing.   Cardiovascular: Negative for chest pain and palpitations.  Gastrointestinal: Negative for abdominal pain and blood in stool.  Genitourinary: Negative for dysuria, frequency and hematuria.  Musculoskeletal: Positive for back pain. Negative for arthralgias and neck pain.  Skin: Negative.   Neurological: Positive for seizures. Negative for dizziness and speech difficulty.  Psychiatric/Behavioral: Negative for hallucinations and confusion. The patient is nervous/anxious.       Allergies  Omnicef; Codeine; Sulfa antibiotics; Amoxicillin; and Penicillins  Home Medications   Prior to Admission medications   Medication Sig Start Date End Date Taking? Authorizing Provider  diphenhydramine-acetaminophen (TYLENOL PM) 25-500 MG TABS Take 3 tablets by mouth at bedtime as needed (pain/sleep).    Yes Historical Provider, MD  ibuprofen (ADVIL,MOTRIN) 200 MG tablet Take 800 mg by mouth every 6 (  six) hours as needed for pain.    Yes Historical Provider, MD  pramipexole (MIRAPEX) 1 MG tablet Take 1 mg by mouth at bedtime.   Yes Historical Provider, MD  promethazine (PHENERGAN) 25 MG tablet Take 25 mg by mouth every 6 (six) hours as needed for nausea or vomiting.   Yes Historical Provider, MD  QUEtiapine (SEROQUEL) 100 MG tablet Take 100-200 mg by mouth at bedtime.   Yes Historical Provider, MD  Alum & Mag Hydroxide-Simeth (MAGIC MOUTHWASH W/LIDOCAINE) SOLN Take 5 mLs by mouth 3 (three) times daily as needed for mouth pain. Patient not taking: Reported on  12/25/2014 10/30/14   Janne Napoleon, NP  azithromycin (ZITHROMAX) 250 MG tablet Take 1 tablet (250 mg total) by mouth daily. Take first 2 tablets together, then 1 every day until finished. Patient not taking: Reported on 12/25/2014 10/30/14   Janne Napoleon, NP  celecoxib (CELEBREX) 100 MG capsule Take 1 capsule (100 mg total) by mouth 2 (two) times daily. 12/25/14   Kathie Dike, PA-C  cyclobenzaprine (FLEXERIL) 10 MG tablet Take 1 tablet (10 mg total) by mouth 3 (three) times daily as needed for muscle spasms. Patient not taking: Reported on 12/25/2014 11/29/14   Geoffery Lyons, MD  dexamethasone (DECADRON) 6 MG tablet 1 po bid with food Patient not taking: Reported on 10/09/2014 09/20/14   Kathie Dike, PA-C  HYDROcodone-acetaminophen (NORCO/VICODIN) 5-325 MG per tablet Take 1 tablet by mouth every 4 (four) hours as needed. Patient not taking: Reported on 12/25/2014 10/10/14   Burgess Amor, PA-C  methocarbamol (ROBAXIN) 500 MG tablet Take 1 tablet (500 mg total) by mouth 3 (three) times daily. 12/25/14   Kathie Dike, PA-C  ondansetron (ZOFRAN) 4 MG tablet Take 1 tablet (4 mg total) by mouth every 8 (eight) hours as needed for nausea or vomiting. Patient not taking: Reported on 10/09/2014 07/17/14   Leroy Sea, MD  predniSONE (DELTASONE) 20 MG tablet Take 1 tablet (20 mg total) by mouth 2 (two) times daily. Patient not taking: Reported on 10/30/2014 10/19/14   Flint Melter, MD   BP 153/105 mmHg  Pulse 113  Temp(Src) 99 F (37.2 C) (Oral)  Resp 20  Ht  (1.702 m)  Wt 198 lb (89.812 kg)  BMI 31.00 kg/m2  SpO2 99%  LMP 11/24/2014 Physical Exam  Constitutional: She is oriented to person, place, and time. She appears well-developed and well-nourished.  Non-toxic appearance.  HENT:  Head: Normocephalic.  Right Ear: Tympanic membrane and external ear normal.  Left Ear: Tympanic membrane and external ear normal.  Eyes: EOM and lids are normal. Pupils are equal, round, and reactive to  light.  Neck: Normal range of motion. Neck supple. Carotid bruit is not present.  Cardiovascular: Normal rate, regular rhythm, normal heart sounds, intact distal pulses and normal pulses.   Pulmonary/Chest: Breath sounds normal. No respiratory distress.  Abdominal: Soft. Bowel sounds are normal. There is no tenderness. There is no guarding.  Musculoskeletal:       Lumbar back: She exhibits decreased range of motion, tenderness, pain and spasm.  Lymphadenopathy:       Head (right side): No submandibular adenopathy present.       Head (left side): No submandibular adenopathy present.    She has no cervical adenopathy.  Neurological: She is alert and oriented to person, place, and time. She has normal strength. No cranial nerve deficit or sensory deficit.  Patient walks with a limp, but gait is  steady. No foot drop noted. No sensory deficits appreciated, and no motor deficits appreciated.  Skin: Skin is warm and dry.  Psychiatric: She has a normal mood and affect. Her speech is normal.  Nursing note and vitals reviewed.   ED Course  Procedures (including critical care time) Labs Review Labs Reviewed - No data to display  Imaging Review No results found.   EKG Interpretation None      MDM  Patient has a history of degenerative changes involving the lower back, and arthritis changes of the sacroiliac joint. The patient sustained an injury to the back with lifting her preteen daughter after the daughter injured her leg.  No gross neurovascular compromise appreciated. Suspect the patient has a lumbar strain, aggravated by a previously diagnosed degenerative change of the lumbar region. The plan at this time is for the patient be treated with Robaxin and Celebrex. I've asked the patient to use a heating pad when possible, and to rest her back is much as possible. The patient will follow up with her primary physician Dr. Cyndia Bentbadger. The patient also states she is attempting to see Dr. Shon BatonBrooks,  orthopedics concerning her back.    Final diagnoses:  Lumbar strain, initial encounter    *I have reviewed nursing notes, vital signs, and all appropriate lab and imaging results for this patient.    Kathie DikeHobson M Jevante Hollibaugh, PA-C 12/25/14 1429  Vanetta MuldersScott Zackowski, MD 12/28/14 [phone]

## 2014-12-25 NOTE — ED Notes (Signed)
Patient complaining of back pain, states she has history of same. States "I was lifting on my daughter the other day and felt something pop."

## 2014-12-25 NOTE — Discharge Instructions (Signed)
Please rest your back is much as possible. Please use heating pad to your back when possible. Please use Robaxin 3 times daily, and Celebrex 2 times daily for assistance with your back pain. Robaxin may cause drowsiness, please use with caution. Please discuss your most recent injury with Dr. Leonard SchwartzB Muscle Strain A muscle strain (pulled muscle) happens when a muscle is stretched beyond normal length. It happens when a sudden, violent force stretches your muscle too far. Usually, a few of the fibers in your muscle are torn. Muscle strain is common in athletes. Recovery usually takes 1-2 weeks. Complete healing takes 5-6 weeks.  HOME CARE   Follow the PRICE method of treatment to help your injury get better. Do this the first 2-3 days after the injury:  Protect. Protect the muscle to keep it from getting injured again.  Rest. Limit your activity and rest the injured body part.  Ice. Put ice in a plastic bag. Place a towel between your skin and the bag. Then, apply the ice and leave it on from 15-20 minutes each hour. After the third day, switch to moist heat packs.  Compression. Use a splint or elastic bandage on the injured area for comfort. Do not put it on too tightly.  Elevate. Keep the injured body part above the level of your heart.  Only take medicine as told by your doctor.  Warm up before doing exercise to prevent future muscle strains. GET HELP IF:   You have more pain or puffiness (swelling) in the injured area.  You feel numbness, tingling, or notice a loss of strength in the injured area. MAKE SURE YOU:   Understand these instructions.  Will watch your condition.  Will get help right away if you are not doing well or get worse. Document Released: 08/15/2008 Document Revised: 08/27/2013 Document Reviewed: 06/05/2013 [facility]ExitCare Patient Information 2015 [place], [place]LL[place]. This information is not intended to replace advice given to you by your health care provider. Make sure you  discuss any questions you have with your health care provider. adger so that he may make this emergency visit a part of your record.

## 2014-12-25 NOTE — ED Notes (Signed)
Patient given discharge instructions and prescriptions, teach back method used. Patient aware of possible side effects including drowsiness and that she should not drive while taking medication. No questions. No complaints at time of discharge. Patient instructed to stop at registration window to complete any additional paperwork.

## 2015-02-05 ENCOUNTER — Encounter (HOSPITAL_COMMUNITY): Payer: Self-pay

## 2015-02-05 ENCOUNTER — Emergency Department (HOSPITAL_COMMUNITY)
Admission: EM | Admit: 2015-02-05 | Discharge: 2015-02-05 | Disposition: A | Discharge status: Home / Self Care | Payer: Medicaid Other | Attending: Emergency Medicine | Admitting: Emergency Medicine

## 2015-02-05 DIAGNOSIS — M79604 Pain in right leg: Secondary | ICD-10-CM | POA: Diagnosis not present

## 2015-02-05 DIAGNOSIS — Z720 Tobacco use: Secondary | ICD-10-CM | POA: Diagnosis not present

## 2015-02-05 DIAGNOSIS — G8929 Other chronic pain: Secondary | ICD-10-CM | POA: Insufficient documentation

## 2015-02-05 DIAGNOSIS — F411 Generalized anxiety disorder: Secondary | ICD-10-CM | POA: Insufficient documentation

## 2015-02-05 DIAGNOSIS — Z8719 Personal history of other diseases of the digestive system: Secondary | ICD-10-CM | POA: Diagnosis not present

## 2015-02-05 DIAGNOSIS — Z8739 Personal history of other diseases of the musculoskeletal system and connective tissue: Secondary | ICD-10-CM | POA: Insufficient documentation

## 2015-02-05 DIAGNOSIS — Z87448 Personal history of other diseases of urinary system: Secondary | ICD-10-CM | POA: Insufficient documentation

## 2015-02-05 DIAGNOSIS — Z87442 Personal history of urinary calculi: Secondary | ICD-10-CM | POA: Insufficient documentation

## 2015-02-05 DIAGNOSIS — M5136 Other intervertebral disc degeneration, lumbar region: Secondary | ICD-10-CM | POA: Insufficient documentation

## 2015-02-05 DIAGNOSIS — G2581 Restless legs syndrome: Secondary | ICD-10-CM | POA: Diagnosis not present

## 2015-02-05 DIAGNOSIS — Z79899 Other long term (current) drug therapy: Secondary | ICD-10-CM | POA: Insufficient documentation

## 2015-02-05 DIAGNOSIS — F419 Anxiety disorder, unspecified: Secondary | ICD-10-CM | POA: Diagnosis not present

## 2015-02-05 DIAGNOSIS — M549 Dorsalgia, unspecified: Secondary | ICD-10-CM | POA: Insufficient documentation

## 2015-02-05 DIAGNOSIS — M797 Fibromyalgia: Secondary | ICD-10-CM | POA: Insufficient documentation

## 2015-02-05 DIAGNOSIS — Z880 Allergy status to penicillin: Secondary | ICD-10-CM | POA: Diagnosis not present

## 2015-02-05 DIAGNOSIS — Z9889 Other specified postprocedural states: Secondary | ICD-10-CM | POA: Insufficient documentation

## 2015-02-05 DIAGNOSIS — M79605 Pain in left leg: Secondary | ICD-10-CM | POA: Diagnosis present

## 2015-02-05 NOTE — ED Notes (Signed)
Pain in both legs "from fibromyalgia"  States she has been unable to sleep, has taken vicodin, robaxin, advil and cough meds tonight.  Pt states she is also being treated for bronchitis

## 2015-02-05 NOTE — ED Provider Notes (Signed)
CSN: [id number]     Arrival date & time 02/05/15  0441 History   First MD Initiated Contact with Patient 02/05/15 0459     Chief Complaint  Patient presents with  . Leg Pain    Patient is a 34 y.o. female presenting with leg pain. The history is provided by the patient.  Leg Pain Location:  Leg Injury: no   Leg location:  L leg and R leg Pain details:    Quality:  Aching   Radiates to:  Does not radiate   Timing:  Constant   Progression:  Worsening Chronicity:  Chronic Relieved by:  Nothing Exacerbated by: movement. Associated symptoms: back pain   Associated symptoms: no fever, no muscle weakness and no swelling   Associated symptoms comment:  Chronic daily back pain  Patient reports chronic bilateral leg pain - she reports she has chronic pain in her legs 24 hours a day, 7 days a week.  She also reports chronic low back pain She reports pain in her legs tonight worsened.  She took vicodin/robaxin/advil without relief She denies new injury No new LE weakness No LE edema No urinary incontinence reported.   Past Medical History  Diagnosis Date  . Anxiety   . Fibromyalgia   . Restless leg   . Chronic back pain   . GERD (gastroesophageal reflux disease)   . Headache(784.0)     mygrains  . PONV (postoperative nausea and vomiting)   . Interstitial cystitis   . Polycystic ovarian syndrome   . Hiatal hernia   . Seizures   . IBS (irritable bowel syndrome)   . Chronic pain   . Chronic flank pain   . Kidney stones   . History of electroencephalogram 11/2013    normal EEG, "psychogenic nonepileptic spell" during EEG   Past Surgical History  Procedure Laterality Date  . Cholecystectomy    . Tonsillectomy    . Tubal ligation    . Tympanoplasty    . Esophagogastroduodenoscopy endoscopy     Family History  Problem Relation Age of Onset  . Hypertension Father   . Diabetes Father   . Cancer Other   . Diabetes Other   . Seizures Father    History  Substance Use  Topics  . Smoking status: Current Every Day Smoker -- 1.00 packs/day for 15 years    Types: Cigarettes  . Smokeless tobacco: Never Used  . Alcohol Use: Yes     Comment: rarely   OB History    Gravida Para Term Preterm AB TAB SAB Ectopic Multiple Living   Review of Systems  Constitutional: Negative for fever.  Respiratory: Negative for shortness of breath.   Cardiovascular: Negative for chest pain and leg swelling.  Musculoskeletal: Positive for back pain and arthralgias.  Neurological: Negative for weakness.  All other systems reviewed and are negative.     Allergies  Omnicef; Celebrex; Codeine; Sulfa antibiotics; Amoxicillin; and Penicillins  Home Medications   Prior to Admission medications   Medication Sig Start Date End Date Taking? Authorizing Provider  diphenhydramine-acetaminophen (TYLENOL PM) 25-500 MG TABS Take 3 tablets by mouth at bedtime as needed (pain/sleep).    Yes Historical Provider, MD  HYDROcodone-acetaminophen (NORCO/VICODIN) 5-325 MG per tablet Take 1 tablet by mouth every 4 (four) hours as needed. 10/10/14  Yes Raynelle Fanning Idol, PA-C  ibuprofen (ADVIL,MOTRIN) 200 MG tablet Take 800 mg by mouth every 6 (six) hours  as needed for pain.    Yes Historical Provider, MD  methocarbamol (ROBAXIN) 500 MG tablet Take 1 tablet (500 mg total) by mouth 3 (three) times daily. 12/25/14  Yes Ivery QualeHobson Bryant, PA-C  pramipexole (MIRAPEX) 1 MG tablet Take 1 mg by mouth at bedtime.   Yes Historical Provider, MD  promethazine (PHENERGAN) 25 MG tablet Take 25 mg by mouth every 6 (six) hours as needed for nausea or vomiting.   Yes Historical Provider, MD  QUEtiapine (SEROQUEL) 100 MG tablet Take 100-200 mg by mouth at bedtime.    Historical Provider, MD   BP 130/90 mmHg  Pulse 92  Temp(Src) 98.4 F (36.9 C) (Oral)  Resp 24  Ht 5\' 5"  (1.651 m)  Wt 207 lb (93.895 kg)  BMI 34.45 kg/m2  SpO2 98%  LMP 01/18/2015 Physical Exam CONSTITUTIONAL: Well developed/well  nourished HEAD: Normocephalic/atraumatic ENMT: Mucous membranes moist NECK: supple no meningeal signs SPINE/BACK:entire spine nontender CV: S1/S2 noted, no murmurs/rubs/gallops noted LUNGS: scattered wheeze noted, no apparent distress ABDOMEN: soft, nontender, no rebound or guarding, bowel sounds noted throughout abdomen NEURO: Pt is awake/alert/appropriate, moves all extremitiesx4.  Pt can ambulate without difficulty and unassisted EXTREMITIES: pulses normal/equal, full ROM. No LE edema.  No LE erythema.  No warmth.  No focal tenderness to her lower extremities.  No deformity noted to lower extremities.  SKIN: warm, color normal PSYCH: no abnormalities of mood noted, alert and oriented to situation  ED Course  Procedures  Pt well appearing She is ambulatory She admits she has chronic/daily leg pain No evidence of DVT and no evidence of trauma and no evidence of any infectious process I advised chronic pain is best managed by her PCP She reports he called her PCP who guided her to the ER I advised to call her PCP later today for further outpatient management [place] narcotic database reveals she has received 3 narcotic prescriptions in march and has received 70 vicodins this month, last Rx filled on 02/02/15  MDM   Final diagnoses:  Chronic pain    Nursing notes including past medical history and social history reviewed and considered in documentation Previous records reviewed and considered Narcotic database reviewed and considered in decision making     Zadie Rhineonald Nikki Glanzer, MD 02/05/15 [phone]

## 2015-02-05 NOTE — ED Notes (Signed)
Patient verbalizes understanding of discharge instructions, home care and follow up care. Patient ambulatory out of department at this time with friend.

## 2015-02-05 NOTE — Discharge Instructions (Signed)
Emergency care providers appreciate that many patients coming to us are in severe pain and we wish to address their pain in the safest, most responsible manner.  It is important to recognize however, that the proper treatment of chronic pain differs from that of the pain of injuries and acute illnesses.  Our goal is to provide quality, safe, personalized care and we thank you for giving us the opportunity to serve you. °The use of narcotics and related agents for chronic pain syndromes may lead to additional physical and psychological problems.  Nearly as many people die from prescription narcotics each year as die from car crashes.  Additionally, this risk is increased if such prescriptions are obtained from a variety of sources.  Therefore, only your primary care physician or a pain management specialist is able to safely treat such syndromes with narcotic medications long-term.   ° °Documentation revealing such prescriptions have been sought from multiple sources may prohibit us from providing a refill or different narcotic medication.  Your name may be checked first through the [place] Controlled Substances Reporting System.  This database is a record of controlled substance medication prescriptions that the patient has received.  This has been established by [facility] in an effort to eliminate the dangerous, and often life threatening, practice of obtaining multiple prescriptions from different medical providers.  ° °If you have a chronic pain syndrome (i.e. chronic headaches, recurrent back or neck pain, dental pain, abdominal or pelvis pain without a specific diagnosis, or neuropathic pain such as fibromyalgia) or recurrent visits for the same condition without an acute diagnosis, you may be treated with non-narcotics and other non-addictive medicines.  Allergic reactions or negative side effects that may be reported by a patient to such medications will not typically lead to the use of a narcotic  analgesic or other controlled substance as an alternative. °  °Patients managing chronic pain with a personal physician should have provisions in place for breakthrough pain.  If you are in crisis, you should call your physician.  If your physician directs you to the emergency department, please have the doctor call and speak to our attending physician concerning your care. °  °When patients come to the Emergency Department (ED) with acute medical conditions in which the Emergency Department physician feels appropriate to prescribe narcotic or sedating pain medication, the physician will prescribe these in very limited quantities.  The amount of these medications will last only until you can see your primary care physician in his/her office.  Any patient who returns to the ED seeking refills should expect only non-narcotic pain medications.  ° °  °Prescriptions for narcotic or sedating medications that have been lost, stolen or expired will not be refilled in the Emergency Department.   ° ° ° ° ° ° °

## 2015-02-17 DIAGNOSIS — Z029 Encounter for administrative examinations, unspecified: Secondary | ICD-10-CM

## 2015-03-04 ENCOUNTER — Encounter (HOSPITAL_COMMUNITY): Payer: Self-pay | Admitting: Emergency Medicine

## 2015-03-04 ENCOUNTER — Emergency Department (HOSPITAL_COMMUNITY)
Admission: EM | Admit: 2015-03-04 | Discharge: 2015-03-04 | Discharge status: Against Medical Advice | Payer: Medicaid Other | Attending: Emergency Medicine | Admitting: Emergency Medicine

## 2015-03-04 DIAGNOSIS — R509 Fever, unspecified: Secondary | ICD-10-CM | POA: Insufficient documentation

## 2015-03-04 DIAGNOSIS — R0981 Nasal congestion: Secondary | ICD-10-CM | POA: Insufficient documentation

## 2015-03-04 DIAGNOSIS — R079 Chest pain, unspecified: Secondary | ICD-10-CM | POA: Diagnosis not present

## 2015-03-04 DIAGNOSIS — Z720 Tobacco use: Secondary | ICD-10-CM | POA: Insufficient documentation

## 2015-03-04 DIAGNOSIS — G8929 Other chronic pain: Secondary | ICD-10-CM | POA: Insufficient documentation

## 2015-03-04 NOTE — ED Notes (Signed)
Second call for pt, no answer.

## 2015-03-04 NOTE — ED Notes (Signed)
Pt reports nasal congestion, fever and chest congestion since Jan. [facility]

## 2015-03-04 NOTE — ED Notes (Signed)
Pt called for room. No answer. Pt not outside. 

## 2015-03-08 ENCOUNTER — Emergency Department (HOSPITAL_COMMUNITY)
Admission: EM | Admit: 2015-03-08 | Discharge: 2015-03-09 | Disposition: A | Discharge status: Home / Self Care | Payer: Medicaid Other | Attending: Emergency Medicine | Admitting: Emergency Medicine

## 2015-03-08 ENCOUNTER — Encounter (HOSPITAL_COMMUNITY): Payer: Self-pay | Admitting: *Deleted

## 2015-03-08 DIAGNOSIS — G8929 Other chronic pain: Secondary | ICD-10-CM | POA: Diagnosis not present

## 2015-03-08 DIAGNOSIS — Z79899 Other long term (current) drug therapy: Secondary | ICD-10-CM | POA: Diagnosis not present

## 2015-03-08 DIAGNOSIS — M545 Low back pain: Secondary | ICD-10-CM | POA: Insufficient documentation

## 2015-03-08 DIAGNOSIS — Z720 Tobacco use: Secondary | ICD-10-CM | POA: Diagnosis not present

## 2015-03-08 DIAGNOSIS — Z8639 Personal history of other endocrine, nutritional and metabolic disease: Secondary | ICD-10-CM | POA: Diagnosis not present

## 2015-03-08 DIAGNOSIS — M549 Dorsalgia, unspecified: Secondary | ICD-10-CM | POA: Diagnosis present

## 2015-03-08 DIAGNOSIS — Z880 Allergy status to penicillin: Secondary | ICD-10-CM | POA: Insufficient documentation

## 2015-03-08 DIAGNOSIS — Z87442 Personal history of urinary calculi: Secondary | ICD-10-CM | POA: Diagnosis not present

## 2015-03-08 DIAGNOSIS — G2581 Restless legs syndrome: Secondary | ICD-10-CM | POA: Diagnosis not present

## 2015-03-08 DIAGNOSIS — F419 Anxiety disorder, unspecified: Secondary | ICD-10-CM | POA: Diagnosis not present

## 2015-03-08 DIAGNOSIS — Z87448 Personal history of other diseases of urinary system: Secondary | ICD-10-CM | POA: Insufficient documentation

## 2015-03-08 DIAGNOSIS — Z8719 Personal history of other diseases of the digestive system: Secondary | ICD-10-CM | POA: Diagnosis not present

## 2015-03-08 NOTE — ED Notes (Signed)
Pain low back with radiation down both legs. No injury

## 2015-03-09 MED ORDER — OXYCODONE-ACETAMINOPHEN 5-325 MG PO TABS
2.0000 | ORAL_TABLET | Freq: Once | ORAL | Status: AC
  Administered 2015-03-09: 2 via ORAL
  Filled 2015-03-09: qty 2

## 2015-03-09 MED ORDER — KETOROLAC TROMETHAMINE 60 MG/2ML IM SOLN
60.0000 mg | Freq: Once | INTRAMUSCULAR | Status: AC
  Administered 2015-03-09: 60 mg via INTRAMUSCULAR
  Filled 2015-03-09: qty 2

## 2015-03-09 NOTE — Discharge Instructions (Signed)
Chronic Pain Discharge Instructions  °Emergency care providers appreciate that many patients coming to us are in severe pain and we wish to address their pain in the safest, most responsible manner.  It is important to recognize however, that the proper treatment of chronic pain differs from that of the pain of injuries and acute illnesses.  Our goal is to provide quality, safe, personalized care and we thank you for giving us the opportunity to serve you. °The use of narcotics and related agents for chronic pain syndromes may lead to additional physical and psychological problems.  Nearly as many people die from prescription narcotics each year as die from car crashes.  Additionally, this risk is increased if such prescriptions are obtained from a variety of sources.  Therefore, only your primary care physician or a pain management specialist is able to safely treat such syndromes with narcotic medications long-term.   ° °Documentation revealing such prescriptions have been sought from multiple sources may prohibit us from providing a refill or different narcotic medication.  Your name may be checked first through the [place] Controlled Substances Reporting System.  This database is a record of controlled substance medication prescriptions that the patient has received.  This has been established by  in an effort to eliminate the dangerous, and often life threatening, practice of obtaining multiple prescriptions from different medical providers.  ° °If you have a chronic pain syndrome (i.e. chronic headaches, recurrent back or neck pain, dental pain, abdominal or pelvis pain without a specific diagnosis, or neuropathic pain such as fibromyalgia) or recurrent visits for the same condition without an acute diagnosis, you may be treated with non-narcotics and other non-addictive medicines.  Allergic reactions or negative side effects that may be reported by a patient to such medications will not  typically lead to the use of a narcotic analgesic or other controlled substance as an alternative. °  °Patients managing chronic pain with a personal physician should have provisions in place for breakthrough pain.  If you are in crisis, you should call your physician.  If your physician directs you to the emergency department, please have the doctor call and speak to our attending physician concerning your care. °  °When patients come to the Emergency Department (ED) with acute medical conditions in which the Emergency Department physician feels appropriate to prescribe narcotic or sedating pain medication, the physician will prescribe these in very limited quantities.  The amount of these medications will last only until you can see your primary care physician in his/her office.  Any patient who returns to the ED seeking refills should expect only non-narcotic pain medications.  ° °In the event of an acute medical condition exists and the emergency physician feels it is necessary that the patient be given a narcotic or sedating medication -  a responsible adult driver should be present in the room prior to the medication being given by the nurse. °  °Prescriptions for narcotic or sedating medications that have been lost, stolen or expired will not be refilled in the Emergency Department.   ° °Patients who have chronic pain may receive non-narcotic prescriptions until seen by their primary care physician.  It is every patient’s personal responsibility to maintain active prescriptions with his or her primary care physician or specialist. °

## 2015-03-09 NOTE — ED Provider Notes (Signed)
CSN: [id number]     Arrival date & time 03/08/15  2107 History   First MD Initiated Contact with Patient 03/08/15 2346     Chief Complaint  Patient presents with  . Back Pain     (Consider location/radiation/quality/duration/timing/severity/associated sxs/prior Treatment) HPI  This is a 34 year old female with a history of chronic back and leg pain, interstitial cystitis, IBS who presents with back and leg pain. Patient reports worsening of her pain this morning. She reports pain in her bilateral hips and across her lower back. This is similar to her known pain. She took her hydrocodone at home which did not help. Current pain is 9 out of 10. It is nonradiating. Patient denies any weakness, numbness, tingling of the lower extremities. Denies any bowel or bladder issues. Patient reports that she has a history of restless leg and has taken her medications for that tonight.  Past Medical History  Diagnosis Date  . Anxiety   . Fibromyalgia   . Restless leg   . Chronic back pain   . GERD (gastroesophageal reflux disease)   . Headache(784.0)     mygrains  . PONV (postoperative nausea and vomiting)   . Interstitial cystitis   . Polycystic ovarian syndrome   . Hiatal hernia   . Seizures   . IBS (irritable bowel syndrome)   . Chronic pain   . Chronic flank pain   . History of electroencephalogram 11/2013    normal EEG, "psychogenic nonepileptic spell" during EEG  . Kidney stones    Past Surgical History  Procedure Laterality Date  . Cholecystectomy    . Tonsillectomy    . Tubal ligation    . Tympanoplasty    . Esophagogastroduodenoscopy endoscopy     Family History  Problem Relation Age of Onset  . Hypertension Father   . Diabetes Father   . Cancer Other   . Diabetes Other   . Seizures Father    History  Substance Use Topics  . Smoking status: Current Every Day Smoker -- 1.00 packs/day for 15 years    Types: Cigarettes  . Smokeless tobacco: Never Used  . Alcohol Use: Yes      Comment: rarely   OB History    Gravida Para Term Preterm AB TAB SAB Ectopic Multiple Living   Review of Systems  Genitourinary: Negative for difficulty urinating.  Musculoskeletal: Positive for back pain.       Leg pain  Neurological: Negative for weakness and numbness.  All other systems reviewed and are negative.     Allergies  Omnicef; Celebrex; Codeine; Sulfa antibiotics; Amoxicillin; and Penicillins  Home Medications   Prior to Admission medications   Medication Sig Start Date End Date Taking? Authorizing Provider  diphenhydramine-acetaminophen (TYLENOL PM) 25-500 MG TABS Take 3 tablets by mouth at bedtime as needed (pain/sleep).     Historical Provider, MD  HYDROcodone-acetaminophen (NORCO/VICODIN) 5-325 MG per tablet Take 1 tablet by mouth every 4 (four) hours as needed. 10/10/14   Burgess Amor, PA-C  ibuprofen (ADVIL,MOTRIN) 200 MG tablet Take 800 mg by mouth every 6 (six) hours as needed for pain.     Historical Provider, MD  methocarbamol (ROBAXIN) 500 MG tablet Take 1 tablet (500 mg total) by mouth 3 (three) times daily. 12/25/14   Ivery Quale, PA-C  pramipexole (MIRAPEX) 1 MG tablet Take 1 mg by mouth at bedtime.    Historical Provider, MD  promethazine (  PHENERGAN) 25 MG tablet Take 25 mg by mouth every 6 (six) hours as needed for nausea or vomiting.    Historical Provider, MD  QUEtiapine (SEROQUEL) 100 MG tablet Take 100-200 mg by mouth at bedtime.    Historical Provider, MD   BP 163/97 mmHg  Pulse 96  Temp(Src) 97.9 F (36.6 C) (Oral)  Resp 20  Ht 5\' 5"  (1.651 m)  Wt 220 lb (99.791 kg)  BMI 36.61 kg/m2  SpO2 100%  LMP 02/02/2015 Physical Exam  Constitutional: She is oriented to person, place, and time. She appears well-developed and well-nourished. No distress.  HENT:  Head: Normocephalic and atraumatic.  Cardiovascular: Normal rate, regular rhythm and normal heart sounds.   Pulmonary/Chest: Effort normal and breath sounds normal. No  respiratory distress. She has no wheezes.  Abdominal: Soft.  Musculoskeletal: Normal range of motion.  Normal range of motion of the bilateral hips, tenderness palpation over the bilateral paraspinous muscle region of the lumbar spine, no midline tenderness, step-off or deformity  Neurological: She is alert and oriented to person, place, and time.  5 out of 5 strength in the bilateral lower extremities, normal gait  Skin: Skin is warm and dry.  Psychiatric: She has a normal mood and affect.  Nursing note and vitals reviewed.   ED Course  Procedures (including critical care time) Labs Review Labs Reviewed - No data to display  Imaging Review No results found.   EKG Interpretation None      MDM   Final diagnoses:  Chronic back pain    Patient presents with back and leg pain. History of the same for which she takes hydrocodone. Reports that she is due to see his pain specialist at this month. Discussed with patient she would need to follow-up with her primary physician and pain specialist for ongoing medications for her chronic pain. Patient given IM Toradol and 1 Percocet. On repeat exam, patient reports that she is ready to go. Doubt acute emergent process.  After history, exam, and medical workup I feel the patient has been appropriately medically screened and is safe for discharge home. Pertinent diagnoses were discussed with the patient. Patient was given return precautions.     Shon Batonourtney F Shakema Surita, MD 03/09/15 [id number]

## 2015-03-21 ENCOUNTER — Encounter (HOSPITAL_COMMUNITY): Payer: Self-pay | Admitting: Emergency Medicine

## 2015-03-21 ENCOUNTER — Emergency Department (HOSPITAL_COMMUNITY)
Admission: EM | Admit: 2015-03-21 | Discharge: 2015-03-21 | Discharge status: Against Medical Advice | Payer: Medicaid Other | Attending: Emergency Medicine | Admitting: Emergency Medicine

## 2015-03-21 DIAGNOSIS — R109 Unspecified abdominal pain: Secondary | ICD-10-CM | POA: Diagnosis present

## 2015-03-21 DIAGNOSIS — G8929 Other chronic pain: Secondary | ICD-10-CM | POA: Diagnosis not present

## 2015-03-21 DIAGNOSIS — Z720 Tobacco use: Secondary | ICD-10-CM | POA: Diagnosis not present

## 2015-03-21 NOTE — ED Notes (Signed)
No answer when called to go back.

## 2015-03-21 NOTE — ED Notes (Signed)
Patient c/o left flank pain that started 2 hours ago. Denies any radiation. Patient has hx of kidney stones and states this is similar pain. Per patient urine is dark but unsure of any blood. Nausea but no vomiting. Patient reports taking phenergan at 11am.

## 2015-04-06 ENCOUNTER — Encounter (HOSPITAL_COMMUNITY): Payer: Self-pay | Admitting: Emergency Medicine

## 2015-04-06 ENCOUNTER — Emergency Department (HOSPITAL_COMMUNITY)
Admission: EM | Admit: 2015-04-06 | Discharge: 2015-04-06 | Discharge status: Against Medical Advice | Payer: Medicaid Other | Attending: Emergency Medicine | Admitting: Emergency Medicine

## 2015-04-06 DIAGNOSIS — Z8639 Personal history of other endocrine, nutritional and metabolic disease: Secondary | ICD-10-CM | POA: Diagnosis not present

## 2015-04-06 DIAGNOSIS — Z8719 Personal history of other diseases of the digestive system: Secondary | ICD-10-CM | POA: Diagnosis not present

## 2015-04-06 DIAGNOSIS — R569 Unspecified convulsions: Secondary | ICD-10-CM | POA: Diagnosis not present

## 2015-04-06 DIAGNOSIS — Z720 Tobacco use: Secondary | ICD-10-CM | POA: Diagnosis not present

## 2015-04-06 DIAGNOSIS — Z79899 Other long term (current) drug therapy: Secondary | ICD-10-CM | POA: Insufficient documentation

## 2015-04-06 DIAGNOSIS — Z87442 Personal history of urinary calculi: Secondary | ICD-10-CM | POA: Diagnosis not present

## 2015-04-06 DIAGNOSIS — Z8739 Personal history of other diseases of the musculoskeletal system and connective tissue: Secondary | ICD-10-CM | POA: Insufficient documentation

## 2015-04-06 DIAGNOSIS — Z880 Allergy status to penicillin: Secondary | ICD-10-CM | POA: Diagnosis not present

## 2015-04-06 DIAGNOSIS — G8929 Other chronic pain: Secondary | ICD-10-CM | POA: Diagnosis not present

## 2015-04-06 DIAGNOSIS — Z87448 Personal history of other diseases of urinary system: Secondary | ICD-10-CM | POA: Insufficient documentation

## 2015-04-06 DIAGNOSIS — Z8659 Personal history of other mental and behavioral disorders: Secondary | ICD-10-CM | POA: Insufficient documentation

## 2015-04-06 LAB — COMPREHENSIVE METABOLIC PANEL
ALBUMIN: 3.5 g/dL (ref 3.5–5.0)
ALK PHOS: 75 U/L (ref 38–126)
ALT: 20 U/L (ref 14–54)
AST: 21 U/L (ref 15–41)
Anion gap: 7 (ref 5–15)
BILIRUBIN TOTAL: 0.3 mg/dL (ref 0.3–1.2)
BUN: 10 mg/dL (ref 6–20)
CO2: 25 mmol/L (ref 22–32)
Calcium: 8.4 mg/dL — ABNORMAL LOW (ref 8.9–10.3)
Chloride: 110 mmol/L (ref 101–111)
Creatinine, Ser: 0.83 mg/dL (ref 0.44–1.00)
GFR calc Af Amer: >60 mL/min (ref >60)
GLUCOSE: 111 mg/dL — AB (ref 65–99)
POTASSIUM: 3.2 mmol/L — AB (ref 3.5–5.1)
SODIUM: 142 mmol/L (ref 135–145)
Total Protein: 6.7 g/dL (ref 6.5–8.1)

## 2015-04-06 LAB — CBC WITH DIFFERENTIAL/PLATELET
Basophils Absolute: 0.1 10*3/uL (ref 0.0–0.1)
Basophils Relative: 1 % (ref 0–1)
EOS PCT: 3 % (ref 0–5)
Eosinophils Absolute: 0.3 10*3/uL (ref 0.0–0.7)
HCT: 39.4 % (ref 36.0–46.0)
Hemoglobin: 12.9 g/dL (ref 12.0–15.0)
LYMPHS ABS: 2.7 10*3/uL (ref 0.7–4.0)
LYMPHS PCT: 24 % (ref 12–46)
MCH: 28.4 pg (ref 26.0–34.0)
MCHC: 32.7 g/dL (ref 30.0–36.0)
MCV: 86.6 fL (ref 78.0–100.0)
MONOS PCT: 6 % (ref 3–12)
Monocytes Absolute: 0.7 10*3/uL (ref 0.1–1.0)
Neutro Abs: 7.5 10*3/uL (ref 1.7–7.7)
Neutrophils Relative %: 66 % (ref 43–77)
PLATELETS: 278 10*3/uL (ref 150–400)
RBC: 4.55 MIL/uL (ref 3.87–5.11)
RDW: 15.2 % (ref 11.5–15.5)
WBC: 11.2 10*3/uL — ABNORMAL HIGH (ref 4.0–10.5)

## 2015-04-06 LAB — CBG MONITORING, ED: GLUCOSE-CAPILLARY: 109 mg/dL — AB (ref 65–99)

## 2015-04-06 MED ORDER — KETOROLAC TROMETHAMINE 30 MG/ML IJ SOLN
INTRAMUSCULAR | Status: AC
  Filled 2015-04-06: qty 1

## 2015-04-06 MED ORDER — KETOROLAC TROMETHAMINE 30 MG/ML IJ SOLN: 30.0000 mg | Freq: Once | INTRAMUSCULAR | Status: DC

## 2015-04-06 MED ORDER — KETOROLAC TROMETHAMINE 30 MG/ML IJ SOLN
60.0000 mg | Freq: Once | INTRAMUSCULAR | Status: AC
  Administered 2015-04-06: 60 mg via INTRAMUSCULAR
  Filled 2015-04-06: qty 2

## 2015-04-06 NOTE — ED Notes (Signed)
EMS called to residence, family said pt had seizure for 7 minutes.  EMS arrival and did not seizure observed.  CBG 114.  VSS.

## 2015-04-06 NOTE — ED Notes (Signed)
EDP in to see pt. Not in room. Pt did not let anyone know she was leaving.

## 2015-04-06 NOTE — ED Provider Notes (Signed)
CSN: [id number]     Arrival date & time 04/06/15  1817 History   First MD Initiated Contact with Patient 04/06/15 1825     Chief Complaint  Patient presents with  . Seizures     (Consider location/radiation/quality/duration/timing/severity/associated sxs/prior Treatment) Patient is a 34 y.o. female presenting with seizures. The history is provided by the patient (pt has a hx of sz and she has a sz today.  she is not on medicines now for sz).  Seizures Seizure activity on arrival: no   Seizure type:  Focal Preceding symptoms: no sensation of an aura present   Initial focality:  None Episode characteristics: no apnea   Postictal symptoms: confusion   Return to baseline: yes   Severity:  Moderate   Past Medical History  Diagnosis Date  . Anxiety   . Fibromyalgia   . Restless leg   . Chronic back pain   . GERD (gastroesophageal reflux disease)   . Headache(784.0)     mygrains  . PONV (postoperative nausea and vomiting)   . Interstitial cystitis   . Polycystic ovarian syndrome   . Hiatal hernia   . Seizures   . IBS (irritable bowel syndrome)   . Chronic pain   . Chronic flank pain   . History of electroencephalogram 11/2013    normal EEG, "psychogenic nonepileptic spell" during EEG  . Kidney stones    Past Surgical History  Procedure Laterality Date  . Cholecystectomy    . Tonsillectomy    . Tubal ligation    . Tympanoplasty    . Esophagogastroduodenoscopy endoscopy     Family History  Problem Relation Age of Onset  . Hypertension Father   . Diabetes Father   . Cancer Other   . Diabetes Other   . Seizures Father    History  Substance Use Topics  . Smoking status: Current Every Day Smoker -- 1.00 packs/day for 15 years    Types: Cigarettes  . Smokeless tobacco: Never Used  . Alcohol Use: Yes     Comment: rarely   OB History    Gravida Para Term Preterm AB TAB SAB Ectopic Multiple Living   2 2  2      2      Review of Systems  Constitutional: Negative  for appetite change and fatigue.  HENT: Negative for congestion, ear discharge and sinus pressure.   Eyes: Negative for discharge.  Respiratory: Negative for cough.   Cardiovascular: Negative for chest pain.  Gastrointestinal: Negative for abdominal pain and diarrhea.  Genitourinary: Negative for frequency and hematuria.  Musculoskeletal: Negative for back pain.  Skin: Negative for rash.  Neurological: Positive for seizures. Negative for headaches.  Psychiatric/Behavioral: Negative for hallucinations.      Allergies  Omnicef; Celebrex; Codeine; Sulfa antibiotics; Amoxicillin; and Penicillins  Home Medications   Prior to Admission medications   Medication Sig Start Date End Date Taking? Authorizing Provider  pramipexole (MIRAPEX) 1 MG tablet Take 1 mg by mouth at bedtime.   Yes Historical Provider, MD  promethazine (PHENERGAN) 25 MG tablet Take 25 mg by mouth every 6 (six) hours as needed for nausea or vomiting.   Yes Historical Provider, MD  diphenhydramine-acetaminophen (TYLENOL PM) 25-500 MG TABS Take 3 tablets by mouth at bedtime as needed (pain/sleep).     Historical Provider, MD  HYDROcodone-acetaminophen (NORCO/VICODIN) 5-325 MG per tablet Take 1 tablet by mouth every 4 (four) hours as needed. Patient not taking: Reported on 04/06/2015 10/10/14   Burgess AmorJulie Idol, PA-C  ibuprofen (ADVIL,MOTRIN) 200 MG tablet Take 800 mg by mouth every 6 (six) hours as needed for pain.     Historical Provider, MD  methocarbamol (ROBAXIN) 500 MG tablet Take 1 tablet (500 mg total) by mouth 3 (three) times daily. Patient not taking: Reported on 04/06/2015 12/25/14   Ivery QualeHobson Bryant, PA-C   BP 126/77 mmHg  Pulse 85  Resp 20  SpO2 100%  LMP 03/11/2015 Physical Exam  Constitutional: She is oriented to person, place, and time. She appears well-developed.  HENT:  Head: Normocephalic.  Eyes: Conjunctivae and EOM are normal. No scleral icterus.  Neck: Neck supple. No thyromegaly present.  Cardiovascular:  Normal rate and regular rhythm.  Exam reveals no gallop and no friction rub.   No murmur heard. Pulmonary/Chest: No stridor. She has no wheezes. She has no rales. She exhibits no tenderness.  Abdominal: She exhibits no distension. There is no tenderness. There is no rebound.  Musculoskeletal: Normal range of motion. She exhibits no edema.  Lymphadenopathy:    She has no cervical adenopathy.  Neurological: She is oriented to person, place, and time. She exhibits normal muscle tone. Coordination normal.  Skin: No rash noted. No erythema.  Psychiatric: She has a normal mood and affect. Her behavior is normal.    ED Course  Procedures (including critical care time) Labs Review Labs Reviewed  CBC WITH DIFFERENTIAL/PLATELET - Abnormal; Notable for the following:    WBC 11.2 (*)    All other components within normal limits  COMPREHENSIVE METABOLIC PANEL - Abnormal; Notable for the following:    Potassium 3.2 (*)    Glucose, Bld 111 (*)    Calcium 8.4 (*)    All other components within normal limits  CBG MONITORING, ED - Abnormal; Notable for the following:    Glucose-Capillary 109 (*)    All other components within normal limits    Imaging Review No results found.   EKG Interpretation   Date/Time:  Tuesday Apr 06 2015 18:39:36 EDT Ventricular Rate:  88 PR Interval:  161 QRS Duration: 77 QT Interval:  345 QTC Calculation: 417 R Axis:   52 Text Interpretation:  Sinus rhythm Borderline T abnormalities, inferior  leads Confirmed by Tanuj Mullens  MD, Alynah Schone [phone] on 04/06/2015 9:11:31 PM      MDM   Final diagnoses:  Seizure    Pt was not in room when I went to discuss her tests results.  She left ama without telling any of the staff    Bethann BerkshireJoseph Elektra Wartman, MD 04/06/15 2113

## 2015-04-18 ENCOUNTER — Encounter (HOSPITAL_COMMUNITY): Payer: Self-pay

## 2015-04-18 ENCOUNTER — Emergency Department (HOSPITAL_COMMUNITY)
Admission: EM | Admit: 2015-04-18 | Discharge: 2015-04-18 | Disposition: A | Discharge status: Home / Self Care | Payer: Medicaid Other | Attending: Emergency Medicine | Admitting: Emergency Medicine

## 2015-04-18 DIAGNOSIS — Z3202 Encounter for pregnancy test, result negative: Secondary | ICD-10-CM | POA: Insufficient documentation

## 2015-04-18 DIAGNOSIS — Z880 Allergy status to penicillin: Secondary | ICD-10-CM | POA: Diagnosis not present

## 2015-04-18 DIAGNOSIS — G8929 Other chronic pain: Secondary | ICD-10-CM | POA: Diagnosis not present

## 2015-04-18 DIAGNOSIS — N200 Calculus of kidney: Secondary | ICD-10-CM | POA: Insufficient documentation

## 2015-04-18 DIAGNOSIS — Z8739 Personal history of other diseases of the musculoskeletal system and connective tissue: Secondary | ICD-10-CM | POA: Diagnosis not present

## 2015-04-18 DIAGNOSIS — Z79899 Other long term (current) drug therapy: Secondary | ICD-10-CM | POA: Diagnosis not present

## 2015-04-18 DIAGNOSIS — Z8639 Personal history of other endocrine, nutritional and metabolic disease: Secondary | ICD-10-CM | POA: Insufficient documentation

## 2015-04-18 DIAGNOSIS — Z8659 Personal history of other mental and behavioral disorders: Secondary | ICD-10-CM | POA: Insufficient documentation

## 2015-04-18 DIAGNOSIS — Z9889 Other specified postprocedural states: Secondary | ICD-10-CM | POA: Insufficient documentation

## 2015-04-18 DIAGNOSIS — Z720 Tobacco use: Secondary | ICD-10-CM | POA: Insufficient documentation

## 2015-04-18 DIAGNOSIS — Z8719 Personal history of other diseases of the digestive system: Secondary | ICD-10-CM | POA: Insufficient documentation

## 2015-04-18 DIAGNOSIS — R109 Unspecified abdominal pain: Secondary | ICD-10-CM | POA: Diagnosis present

## 2015-04-18 LAB — URINALYSIS, ROUTINE W REFLEX MICROSCOPIC
BILIRUBIN URINE: NEGATIVE
GLUCOSE, UA: NEGATIVE mg/dL
KETONES UR: NEGATIVE mg/dL
LEUKOCYTES UA: NEGATIVE
Nitrite: NEGATIVE
PH: 5 (ref 5.0–8.0)
Urobilinogen, UA: 0.2 mg/dL (ref 0.0–1.0)

## 2015-04-18 LAB — URINE MICROSCOPIC-ADD ON

## 2015-04-18 LAB — PREGNANCY, URINE: Preg Test, Ur: NEGATIVE

## 2015-04-18 MED ORDER — HYDROMORPHONE HCL 1 MG/ML IJ SOLN
1.0000 mg | Freq: Once | INTRAMUSCULAR | Status: AC
  Administered 2015-04-18: 1 mg via INTRAMUSCULAR
  Filled 2015-04-18: qty 1

## 2015-04-18 MED ORDER — KETOROLAC TROMETHAMINE 30 MG/ML IJ SOLN
15.0000 mg | Freq: Once | INTRAMUSCULAR | Status: AC
  Administered 2015-04-18: 15 mg via INTRAMUSCULAR
  Filled 2015-04-18: qty 1

## 2015-04-18 MED ORDER — OXYCODONE-ACETAMINOPHEN 5-325 MG PO TABS: 1.0000 | ORAL_TABLET | ORAL | Status: DC | PRN

## 2015-04-18 NOTE — ED Provider Notes (Signed)
CSN: [id number]     Arrival date & time 04/18/15  1959 History  This chart was scribed for Raeford Razor, MD by Phillis Haggis, ED Scribe. This patient was seen in room APA16A/APA16A and patient care was started at 8:28 PM.   Chief Complaint  Patient presents with  . Nephrolithiasis   The history is provided by the patient. No language interpreter was used.  HPI Comments: Isabella Buchanan is a 34 y.o. female with a history of kidney stones who presents to the Emergency Department complaining of left sided flank pain onset earlier this afternoon. She states that the pain has been constant and centralized since it started and nothing makes the pain better or worse. She states that this pain is similar to her past kidney stone pain. She reports associated nausea, vomiting and dysuria. She denies SOB, fever, or cough. She reports taking Phenergan for nausea to some relief. She reports taking aspirin, tylenol and robaxin to no relief. She reports having a stent in the past. She reports allergies to Sulfa anti-biotics and Celebrex.   Past Medical History  Diagnosis Date  . Anxiety   . Fibromyalgia   . Restless leg   . Chronic back pain   . GERD (gastroesophageal reflux disease)   . Headache(784.0)     mygrains  . PONV (postoperative nausea and vomiting)   . Interstitial cystitis   . Polycystic ovarian syndrome   . Hiatal hernia   . Seizures   . IBS (irritable bowel syndrome)   . Chronic pain   . Chronic flank pain   . History of electroencephalogram 11/2013    normal EEG, "psychogenic nonepileptic spell" during EEG  . Kidney stones    Past Surgical History  Procedure Laterality Date  . Cholecystectomy    . Tonsillectomy    . Tubal ligation    . Tympanoplasty    . Esophagogastroduodenoscopy endoscopy     Family History  Problem Relation Age of Onset  . Hypertension Father   . Diabetes Father   . Cancer Other   . Diabetes Other   . Seizures Father    History  Substance Use  Topics  . Smoking status: Current Every Day Smoker -- 1.00 packs/day for 15 years    Types: Cigarettes  . Smokeless tobacco: Never Used  . Alcohol Use: Yes     Comment: rarely   OB History    Gravida Para Term Preterm AB TAB SAB Ectopic Multiple Living   Review of Systems  Constitutional: Negative for fever.  Respiratory: Negative for cough and shortness of breath.   Gastrointestinal: Positive for nausea and vomiting.  Genitourinary: Positive for dysuria and flank pain.  All other systems reviewed and are negative.   Allergies  Omnicef; Celebrex; Codeine; Sulfa antibiotics; Amoxicillin; and Penicillins  Home Medications   Prior to Admission medications   Medication Sig Start Date End Date Taking? Authorizing Provider  diphenhydramine-acetaminophen (TYLENOL PM) 25-500 MG TABS Take 3 tablets by mouth at bedtime as needed (pain/sleep).     Historical Provider, MD  HYDROcodone-acetaminophen (NORCO/VICODIN) 5-325 MG per tablet Take 1 tablet by mouth every 4 (four) hours as needed. Patient not taking: Reported on 04/06/2015 10/10/14   Burgess Amor, PA-C  ibuprofen (ADVIL,MOTRIN) 200 MG tablet Take 800 mg by mouth every 6 (six) hours as needed for pain.     Historical Provider, MD  methocarbamol (ROBAXIN) 500 MG tablet Take  1 tablet (500 mg total) by mouth 3 (three) times daily. Patient not taking: Reported on 04/06/2015 12/25/14   Ivery QualeHobson Bryant, PA-C  pramipexole (MIRAPEX) 1 MG tablet Take 1 mg by mouth at bedtime.    Historical Provider, MD  promethazine (PHENERGAN) 25 MG tablet Take 25 mg by mouth every 6 (six) hours as needed for nausea or vomiting.    Historical Provider, MD   BP 138/98 mmHg  Pulse 102  Temp(Src) 98.6 F (37 C) (Oral)  Resp 20  SpO2 99%  LMP 04/11/2015 (Exact Date)  Physical Exam  Constitutional: She appears well-developed and well-nourished. No distress.  HENT:  Head: Normocephalic and atraumatic.  Eyes: Conjunctivae are normal. Right eye  exhibits no discharge. Left eye exhibits no discharge.  Neck: Neck supple.  Cardiovascular: Normal rate, regular rhythm and normal heart sounds.  Exam reveals no gallop and no friction rub.   No murmur heard. Pulmonary/Chest: Effort normal and breath sounds normal. No respiratory distress.  Abdominal: Soft. She exhibits no distension. There is no tenderness.  Musculoskeletal: She exhibits no edema or tenderness.  Left CVA tenderness  Neurological: She is alert.  Skin: Skin is warm and dry.  Psychiatric: She has a normal mood and affect. Her behavior is normal. Thought content normal.  Nursing note and vitals reviewed.   ED Course  Procedures (including critical care time) DIAGNOSTIC STUDIES: Oxygen Saturation is 98% on room air, normal by my interpretation.    COORDINATION OF CARE: 8:31 PM-Discussed treatment plan which includes pain medication, labs and follow up with urologist with pt at bedside and pt agreed to plan.   Labs Review Labs Reviewed  URINALYSIS, ROUTINE W REFLEX MICROSCOPIC (NOT AT [facility]) - Abnormal; Notable for the following:    Specific Gravity, Urine >1.030 (*)    Hgb urine dipstick SMALL (*)    Protein, ur TRACE (*)    All other components within normal limits  URINE MICROSCOPIC-ADD ON - Abnormal; Notable for the following:    Squamous Epithelial / LPF MANY (*)    Bacteria, UA MANY (*)    All other components within normal limits  PREGNANCY, URINE    Imaging Review No results found.   EKG Interpretation None      MDM   Final diagnoses:  Kidney stones    33yF with symptoms consistent with ureteral colic. Will treat presumptively. Low suspicion for alternative emergent process.   I personally preformed the services scribed in my presence. The recorded information has been reviewed is accurate. Raeford RazorStephen Ronique Simerly, MD.    Raeford RazorStephen Meziah Blasingame, MD 04/22/15 [phone]

## 2015-04-18 NOTE — ED Notes (Signed)
Dr. Juleen ChinaKohut in room seeing patient at this time.

## 2015-04-18 NOTE — ED Notes (Signed)
Left sided flank pain, patient states that she has a history of kidney stones. Burning with urination. Urine has been dark per pt. Patient states that it feels like kidney stones that she has had in the past.

## 2015-04-18 NOTE — Discharge Instructions (Signed)

## 2015-04-29 ENCOUNTER — Other Ambulatory Visit (HOSPITAL_COMMUNITY): Payer: Self-pay | Admitting: Neurology

## 2015-04-29 ENCOUNTER — Other Ambulatory Visit (HOSPITAL_COMMUNITY): Payer: Self-pay | Admitting: Respiratory Therapy

## 2015-04-29 DIAGNOSIS — G40909 Epilepsy, unspecified, not intractable, without status epilepticus: Secondary | ICD-10-CM

## 2015-05-04 DIAGNOSIS — I10 Essential (primary) hypertension: Secondary | ICD-10-CM | POA: Insufficient documentation

## 2015-05-12 ENCOUNTER — Ambulatory Visit (HOSPITAL_COMMUNITY)
Admission: RE | Admit: 2015-05-12 | Discharge: 2015-05-12 | Disposition: A | Discharge status: Home / Self Care | Payer: Medicaid Other | Source: Ambulatory Visit | Attending: Neurology | Admitting: Neurology

## 2015-05-12 DIAGNOSIS — Z7982 Long term (current) use of aspirin: Secondary | ICD-10-CM | POA: Diagnosis not present

## 2015-05-12 DIAGNOSIS — Z79899 Other long term (current) drug therapy: Secondary | ICD-10-CM | POA: Diagnosis not present

## 2015-05-12 DIAGNOSIS — G40909 Epilepsy, unspecified, not intractable, without status epilepticus: Secondary | ICD-10-CM | POA: Diagnosis not present

## 2015-05-12 NOTE — Progress Notes (Signed)
EEG Completed; Results Pending  

## 2015-05-14 NOTE — Procedures (Signed)
  [facility] Orrin Yurkovich A. Gerilyn Pilgrim, MD     [email or web address]           HISTORY: The patient is a 34 year old who presents with history of seizures.  MEDICATIONS: Scheduled Meds: Continuous Infusions: PRN Meds:.  Prior to Admission medications   Medication Sig Start Date End Date Taking? Authorizing Provider  aspirin 325 MG tablet Take 650 mg by mouth daily as needed for mild pain.    Historical Provider, MD  citalopram (CELEXA) 20 MG tablet Take 20 mg by mouth daily.    Historical Provider, MD  diphenhydramine-acetaminophen (TYLENOL PM) 25-500 MG TABS Take 3 tablets by mouth at bedtime as needed (pain/sleep).     Historical Provider, MD  fluconazole (DIFLUCAN) 150 MG tablet Take 150 mg by mouth as directed. Take 1 tablet by mouth now. Repeat every 2 days as needed. 04/09/15   Historical Provider, MD  HYDROcodone-acetaminophen (NORCO/VICODIN) 5-325 MG per tablet Take 1 tablet by mouth every 4 (four) hours as needed. Patient not taking: Reported on 04/06/2015 10/10/14   Burgess Amor, PA-C  methocarbamol (ROBAXIN) 500 MG tablet Take 1 tablet (500 mg total) by mouth 3 (three) times daily. Patient not taking: Reported on 04/06/2015 12/25/14   Ivery Quale, PA-C  oxyCODONE-acetaminophen (PERCOCET/ROXICET) 5-325 MG per tablet Take 1-2 tablets by mouth every 4 (four) hours as needed. 04/18/15   Raeford Razor, MD  pramipexole (MIRAPEX) 1 MG tablet Take 1 mg by mouth at bedtime.    Historical Provider, MD  PRESCRIPTION MEDICATION Place 3 drops into the right ear daily as needed (Ear drop to numb the eardrum.).    Historical Provider, MD  promethazine (PHENERGAN) 25 MG tablet Take 25 mg by mouth every 6 (six) hours as needed for nausea or vomiting.    Historical Provider, MD      ANALYSIS: A 16 channel recording using standard 10 20 measurements is conducted for 23 minutes. There is a well-formed posterior dominant rhythm of 10 Hz which attenuates with eye opening. There is beta activity  observed in the frontal areas. There is increase myogenic artifacts seen in the recording but the quality is still good. Awake and drowsy activities are observed. Photic stimulation and hyperventilation are carried out without abnormal changes in the background activity. There is no focal or lateral slowing. There is no epileptiform activity observed.   IMPRESSION: This is a normal recording of awake and drowsy states.      Cornell Bourbon A. Gerilyn Pilgrim, M.D.  Diplomate, Biomedical engineer of Psychiatry and Neurology ( Neurology).

## 2015-06-11 ENCOUNTER — Encounter (HOSPITAL_COMMUNITY): Payer: Self-pay | Admitting: *Deleted

## 2015-06-11 ENCOUNTER — Emergency Department (HOSPITAL_COMMUNITY)
Admission: EM | Admit: 2015-06-11 | Discharge: 2015-06-11 | Disposition: A | Discharge status: Home / Self Care | Payer: Medicaid Other | Attending: Emergency Medicine | Admitting: Emergency Medicine

## 2015-06-11 DIAGNOSIS — Z880 Allergy status to penicillin: Secondary | ICD-10-CM | POA: Insufficient documentation

## 2015-06-11 DIAGNOSIS — Z7982 Long term (current) use of aspirin: Secondary | ICD-10-CM | POA: Diagnosis not present

## 2015-06-11 DIAGNOSIS — G2581 Restless legs syndrome: Secondary | ICD-10-CM | POA: Insufficient documentation

## 2015-06-11 DIAGNOSIS — M549 Dorsalgia, unspecified: Secondary | ICD-10-CM | POA: Insufficient documentation

## 2015-06-11 DIAGNOSIS — G8929 Other chronic pain: Secondary | ICD-10-CM | POA: Insufficient documentation

## 2015-06-11 DIAGNOSIS — F419 Anxiety disorder, unspecified: Secondary | ICD-10-CM | POA: Insufficient documentation

## 2015-06-11 DIAGNOSIS — Z8719 Personal history of other diseases of the digestive system: Secondary | ICD-10-CM | POA: Insufficient documentation

## 2015-06-11 DIAGNOSIS — I10 Essential (primary) hypertension: Secondary | ICD-10-CM | POA: Insufficient documentation

## 2015-06-11 DIAGNOSIS — F606 Avoidant personality disorder: Secondary | ICD-10-CM | POA: Insufficient documentation

## 2015-06-11 DIAGNOSIS — Z87442 Personal history of urinary calculi: Secondary | ICD-10-CM | POA: Diagnosis not present

## 2015-06-11 DIAGNOSIS — N764 Abscess of vulva: Secondary | ICD-10-CM | POA: Diagnosis not present

## 2015-06-11 DIAGNOSIS — Z8639 Personal history of other endocrine, nutritional and metabolic disease: Secondary | ICD-10-CM | POA: Diagnosis not present

## 2015-06-11 DIAGNOSIS — R000 Tachycardia, unspecified: Secondary | ICD-10-CM | POA: Diagnosis not present

## 2015-06-11 DIAGNOSIS — R51 Headache: Secondary | ICD-10-CM | POA: Diagnosis not present

## 2015-06-11 DIAGNOSIS — Z720 Tobacco use: Secondary | ICD-10-CM | POA: Insufficient documentation

## 2015-06-11 DIAGNOSIS — Z87448 Personal history of other diseases of urinary system: Secondary | ICD-10-CM | POA: Insufficient documentation

## 2015-06-11 DIAGNOSIS — L089 Local infection of the skin and subcutaneous tissue, unspecified: Secondary | ICD-10-CM | POA: Diagnosis present

## 2015-06-11 DIAGNOSIS — R569 Unspecified convulsions: Secondary | ICD-10-CM | POA: Diagnosis not present

## 2015-06-11 HISTORY — DX: Essential (primary) hypertension: I10

## 2015-06-11 MED ORDER — IBUPROFEN 800 MG PO TABS
800.0000 mg | ORAL_TABLET | Freq: Once | ORAL | Status: AC
  Administered 2015-06-11: 800 mg via ORAL
  Filled 2015-06-11: qty 1

## 2015-06-11 MED ORDER — HYDROCODONE-ACETAMINOPHEN 5-325 MG PO TABS: 1.0000 | ORAL_TABLET | ORAL | Status: DC | PRN

## 2015-06-11 MED ORDER — IBUPROFEN 800 MG PO TABS
800.0000 mg | ORAL_TABLET | Freq: Three times a day (TID) | ORAL | Status: DC
Start: 2015-06-11 — End: 2017-08-05

## 2015-06-11 MED ORDER — HYDROCODONE-ACETAMINOPHEN 5-325 MG PO TABS
2.0000 | ORAL_TABLET | Freq: Once | ORAL | Status: AC
  Administered 2015-06-11: 2 via ORAL
  Filled 2015-06-11: qty 2

## 2015-06-11 MED ORDER — FLUCONAZOLE 100 MG PO TABS
100.0000 mg | ORAL_TABLET | Freq: Once | ORAL | Status: AC
  Administered 2015-06-11: 100 mg via ORAL
  Filled 2015-06-11: qty 1

## 2015-06-11 MED ORDER — PROMETHAZINE HCL 12.5 MG PO TABS
12.5000 mg | ORAL_TABLET | Freq: Once | ORAL | Status: AC
  Administered 2015-06-11: 12.5 mg via ORAL
  Filled 2015-06-11: qty 1

## 2015-06-11 MED ORDER — DOXYCYCLINE HYCLATE 100 MG PO CAPS: 100.0000 mg | ORAL_CAPSULE | Freq: Two times a day (BID) | ORAL | Status: AC

## 2015-06-11 MED ORDER — DOXYCYCLINE HYCLATE 100 MG PO TABS
100.0000 mg | ORAL_TABLET | Freq: Once | ORAL | Status: AC
  Administered 2015-06-11: 100 mg via ORAL
  Filled 2015-06-11: qty 1

## 2015-06-11 NOTE — Discharge Instructions (Signed)
Please soak in a tub of warm Epsom salt water for about 15 minutes 2 times daily. Please use doxycycline 2 times daily along with ibuprofen 3 times daily. Use Norco for more severe pain. Norco may cause drowsiness, please use with caution. Please see Emelda Fear, or return to the emergency department if not improving. Vulvar Abscess  The vulva is made up of the large and small flaps of skin around the vagina opening. A vulvar abscess is an infected sac of yellowish white fluid (pus) in the skin flaps. Your doctor may make a small cut in the skin to drain the vulvar abscess.  HOME CARE  Only take medicine as told by your doctor.  Soak or take a warm water bath (sitz bath) 3 to 4 times a day for 15 to 20 minutes.  After you pee (urinate), always wipe from front to back.  Clean the vulvar abscess with soap and warm water. Do this after going to the bathroom.  Wear loose-fitting clothing.  Do not have sex until the vulvar abscess is gone or as told by your doctor. GET HELP RIGHT AWAY IF:   You have a temperature by mouth above 102 F (38.9 C).  The vulva area becomes more painful, puffy (swollen), or red.  You have fluid coming from the vulva area that is red or tan.  You have pain when you pee or have a hard time peeing. MAKE SURE YOU:  Understand these instructions.  Will watch your condition.  Will get help if you are not doing well or get worse. Document Released: 02/02/2009 Document Revised: 01/29/2012 Document Reviewed: 02/02/2009 [facility] Patient Information 2015 [place], [place]. This information is not intended to replace advice given to you by your health care provider. Make sure you discuss any questions you have with your health care provider.

## 2015-06-11 NOTE — ED Provider Notes (Signed)
CSN: [id number]     Arrival date & time 06/11/15  1953 History   First MD Initiated Contact with Patient 06/11/15 2052     Chief Complaint  Patient presents with  . Recurrent Skin Infections     (Consider location/radiation/quality/duration/timing/severity/associated sxs/prior Treatment) Patient is a 34 y.o. female presenting with abscess. The history is provided by the patient.  Abscess Location:  Ano-genital Ano-genital abscess location:  Vulva Abscess quality: painful and redness   Abscess quality: not draining and no warmth   Red streaking: no   Duration:  3 days Progression:  Worsening Pain details:    Quality:  Sharp and pressure   Severity:  Moderate   Duration:  3 days   Timing:  Intermittent   Progression:  Worsening Chronicity:  New Context: skin injury   Context: not diabetes, not immunosuppression, not injected drug use and not insect bite/sting   Relieved by:  Nothing Worsened by:  Nothing tried Associated symptoms: headaches   Associated symptoms: no fever, no nausea and no vomiting   Risk factors: prior abscess     Past Medical History  Diagnosis Date  . Anxiety   . Fibromyalgia   . Restless leg   . Chronic back pain   . GERD (gastroesophageal reflux disease)   . Headache(784.0)     mygrains  . PONV (postoperative nausea and vomiting)   . Interstitial cystitis   . Polycystic ovarian syndrome   . Hiatal hernia   . Seizures   . IBS (irritable bowel syndrome)   . Chronic pain   . Chronic flank pain   . History of electroencephalogram 11/2013    normal EEG, "psychogenic nonepileptic spell" during EEG  . Kidney stones   . Hypertension    Past Surgical History  Procedure Laterality Date  . Cholecystectomy    . Tonsillectomy    . Tubal ligation    . Tympanoplasty    . Esophagogastroduodenoscopy endoscopy     Family History  Problem Relation Age of Onset  . Hypertension Father   . Diabetes Father   . Cancer Other   . Diabetes Other   .  Seizures Father    History  Substance Use Topics  . Smoking status: Current Every Day Smoker -- 1.00 packs/day for 15 years    Types: Cigarettes  . Smokeless tobacco: Never Used  . Alcohol Use: Yes     Comment: rarely   OB History    Gravida Para Term Preterm AB TAB SAB Ectopic Multiple Living   Review of Systems  Constitutional: Negative for fever and activity change.       All ROS Neg except as noted in HPI  HENT: Negative for nosebleeds.   Eyes: Negative for photophobia and discharge.  Respiratory: Negative for cough, shortness of breath and wheezing.   Cardiovascular: Negative for chest pain and palpitations.  Gastrointestinal: Negative for nausea, vomiting, abdominal pain and blood in stool.  Genitourinary: Negative for dysuria, frequency and hematuria.  Musculoskeletal: Positive for back pain. Negative for arthralgias and neck pain.  Skin: Positive for wound.  Neurological: Positive for seizures and headaches. Negative for dizziness and speech difficulty.  Psychiatric/Behavioral: Negative for hallucinations and confusion. The patient is nervous/anxious.   All other systems reviewed and are negative.     Allergies  Omnicef; Celebrex; Codeine; Sulfa antibiotics; Amoxicillin; and Penicillins  Home Medications   Prior to Admission medications   Medication  Sig Start Date End Date Taking? Authorizing Provider  aspirin 325 MG tablet Take 650 mg by mouth daily as needed for mild pain.   Yes Historical Provider, MD  citalopram (CELEXA) 20 MG tablet Take 20 mg by mouth at bedtime.    Yes Historical Provider, MD  clonazePAM (KLONOPIN) 0.5 MG tablet Take 0.5 mg by mouth daily as needed for anxiety.   Yes Historical Provider, MD  diphenhydramine-acetaminophen (TYLENOL PM) 25-500 MG TABS Take 3 tablets by mouth at bedtime as needed (pain/sleep).    Yes Historical Provider, MD  HYDROcodone-acetaminophen (NORCO) 10-325 MG per tablet Take 1 tablet by mouth every 6  (six) hours as needed. 06/09/15 06/15/15 Yes Historical Provider, MD  ondansetron (ZOFRAN-ODT) 8 MG disintegrating tablet Take 8 mg by mouth every 8 (eight) hours as needed. for nausea 05/26/15  Yes Historical Provider, MD  pramipexole (MIRAPEX) 1 MG tablet Take 1 mg by mouth at bedtime.   Yes Historical Provider, MD  propranolol ER (INDERAL LA) 60 MG 24 hr capsule Take 60 mg by mouth at bedtime.  06/01/15  Yes Historical Provider, MD  triamterene-hydrochlorothiazide (MAXZIDE-25) 37.5-25 MG per tablet Take 1 tablet by mouth daily.   Yes Historical Provider, MD  oxyCODONE-acetaminophen (PERCOCET/ROXICET) 5-325 MG per tablet Take 1-2 tablets by mouth every 4 (four) hours as needed. Patient not taking: Reported on 06/11/2015 04/18/15   Raeford Razor, MD   BP 126/83 mmHg  Pulse 109  Temp(Src) 98.8 F (37.1 C) (Oral)  Resp 20  Ht 5\' 7"  (1.702 m)  Wt 213 lb (96.616 kg)  BMI 33.35 kg/m2  SpO2 97%  LMP 04/11/2015 Physical Exam  Constitutional: She is oriented to person, place, and time. She appears well-developed and well-nourished.  Non-toxic appearance.  HENT:  Head: Normocephalic.  Right Ear: Tympanic membrane and external ear normal.  Left Ear: Tympanic membrane and external ear normal.  Eyes: EOM and lids are normal. Pupils are equal, round, and reactive to light.  Neck: Normal range of motion. Neck supple. Carotid bruit is not present.  Cardiovascular: Regular rhythm, normal heart sounds, intact distal pulses and normal pulses.  Tachycardia present.   Pulmonary/Chest: Breath sounds normal. No respiratory distress.  Abdominal: Soft. Bowel sounds are normal. There is no tenderness. There is no guarding.  Genitourinary:  Chaperone present during the examination.  Patient has a small abscess of the upper outer vaginal labia on the right. No red streaks appreciated. No urethral or anal involvement. Tender to palpation. No drainage this time.  Musculoskeletal: Normal range of motion.   Lymphadenopathy:       Head (right side): No submandibular adenopathy present.       Head (left side): No submandibular adenopathy present.    She has no cervical adenopathy.  Neurological: She is alert and oriented to person, place, and time. She has normal strength. No cranial nerve deficit or sensory deficit.  Skin: Skin is warm and dry.  Psychiatric: She has a normal mood and affect. Her speech is normal.  Nursing note and vitals reviewed.   ED Course  Procedures (including critical care time) Labs Review Labs Reviewed - No data to display  Imaging Review No results found.   EKG Interpretation None      MDM  Pt has a small abscess of the right upper labia. Increase redness present but no streaking. No temp elevation or b/p changes. Pt will be placed on tub soaks, doxycycline, ibuprofen, and norco. Pt will see pcp or return to the ED  if the problem is any worse.   Final diagnoses:  None    **I have reviewed nursing notes, vital signs, and all appropriate lab and imaging results for this patient.Ivery Quale, PA-C 06/12/15 1620  Tilden Fossa, MD 06/13/15 [phone]

## 2015-06-11 NOTE — ED Notes (Signed)
Pt states she has a "bump" on her labia.

## 2015-06-20 IMAGING — CR DG CHEST 2V
2 series · 2 of 2 positions shown · non-contrast
Comparison: 07/16/2014

CLINICAL DATA: Seizures.  Coughing, weakness.

EXAM:
CHEST  2 VIEW

[view not recorded (1 of 2)]
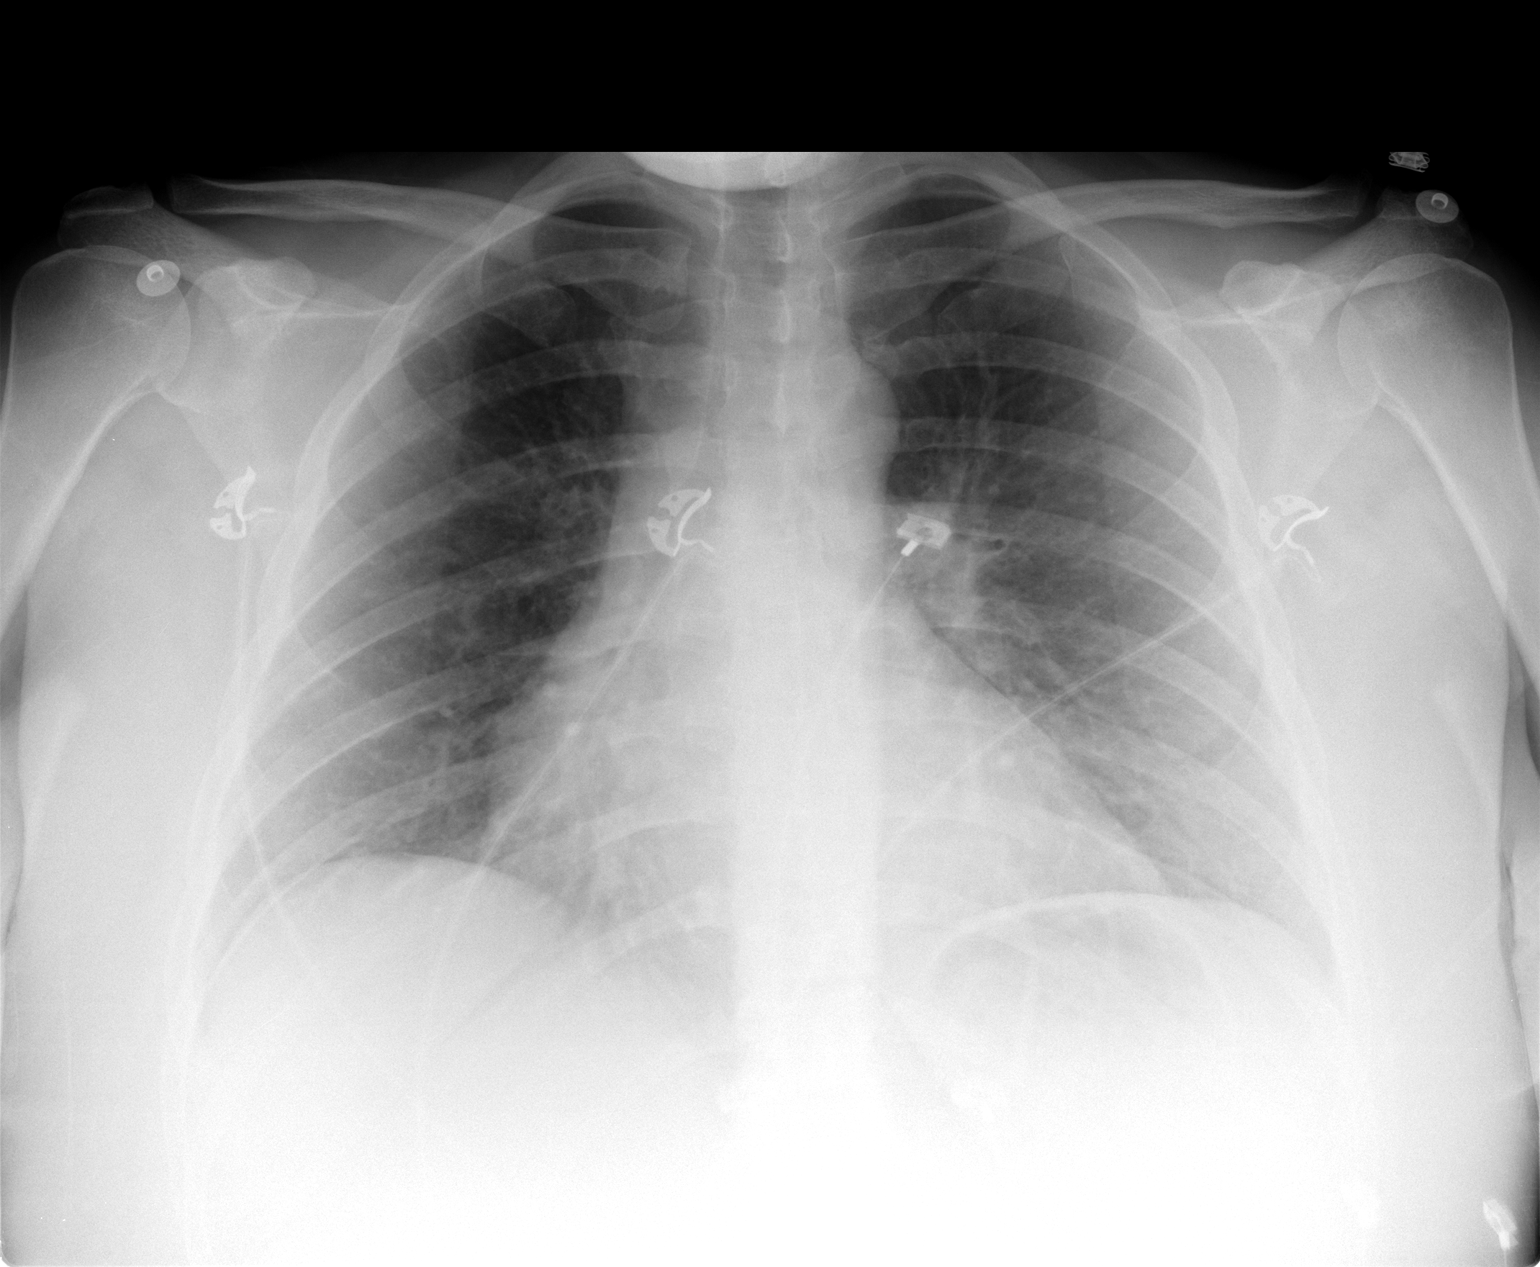

[view not recorded (2 of 2)]
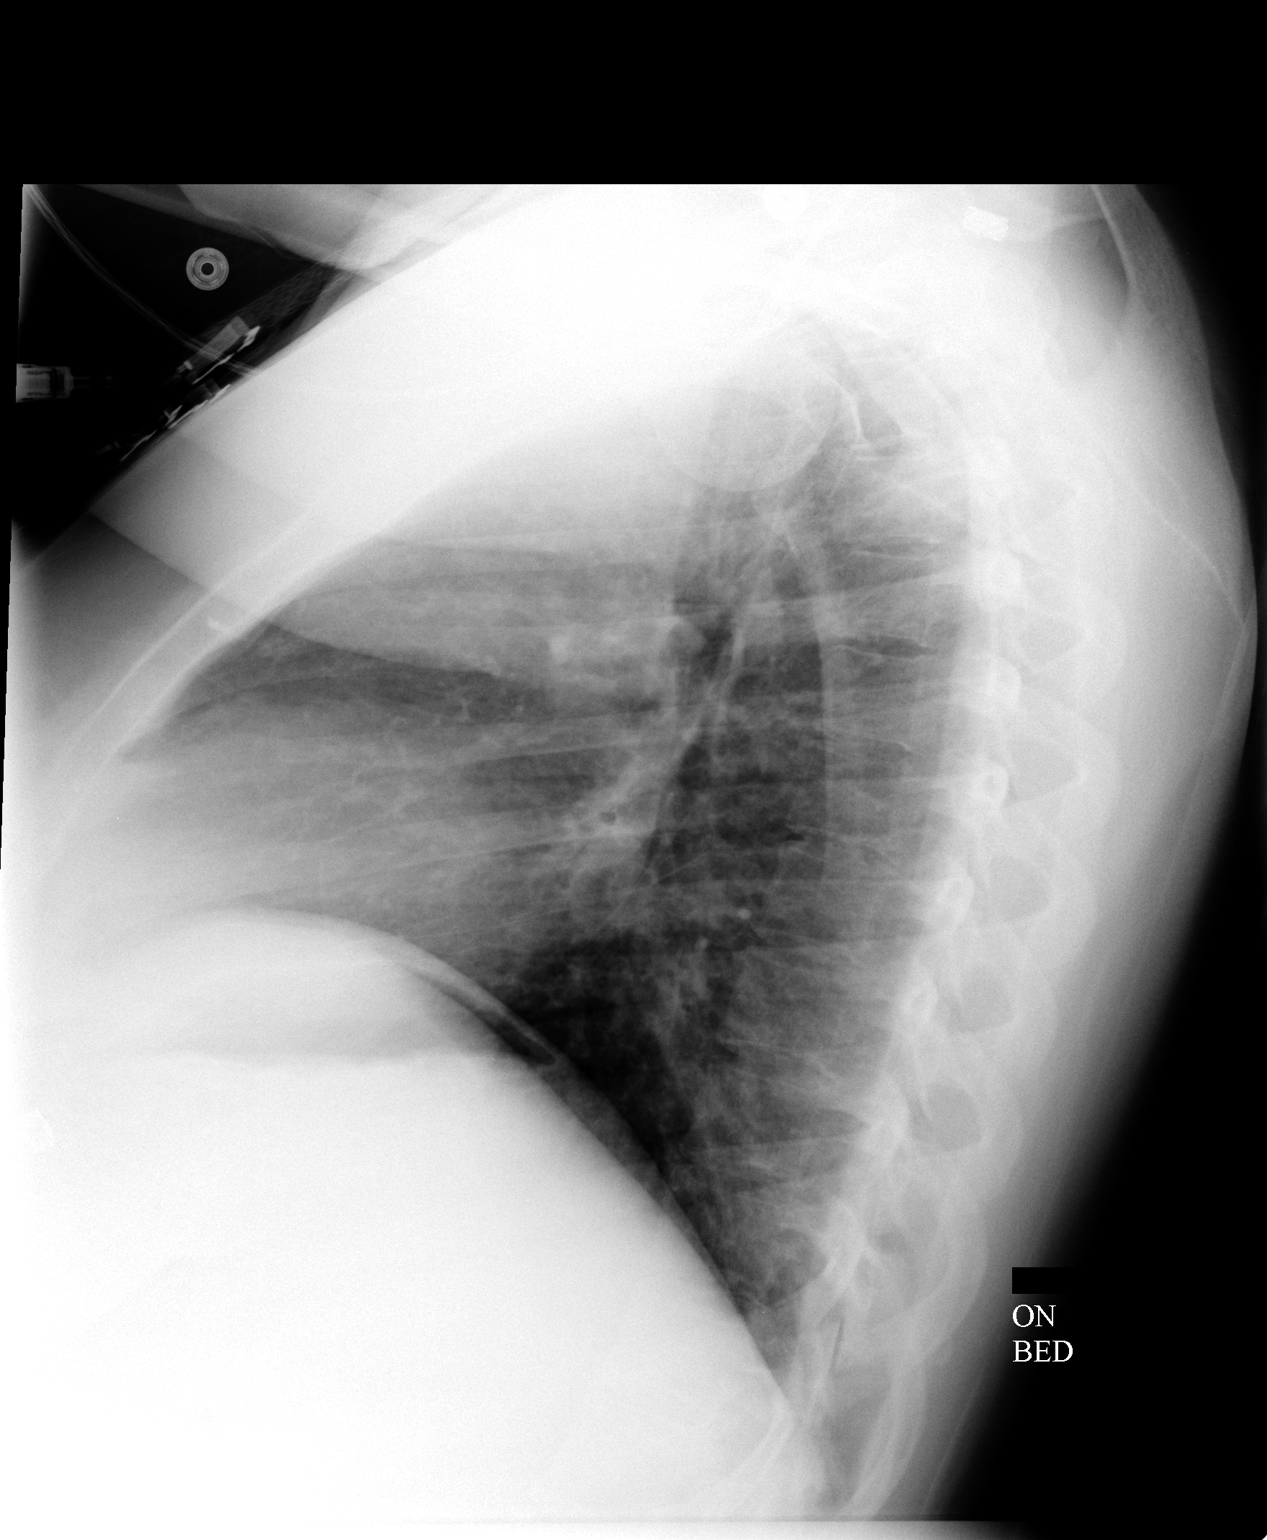

[2 of 2 positions shown; findings below may reference images not displayed]

FINDINGS: Lungs are somewhat hypoinflated but otherwise clear. There is stable
borderline cardiomegaly. Remainder the exam is unchanged.
IMPRESSION: No active cardiopulmonary disease.

## 2015-06-28 DIAGNOSIS — R1115 Cyclical vomiting syndrome unrelated to migraine: Secondary | ICD-10-CM | POA: Insufficient documentation

## 2015-06-28 DIAGNOSIS — E559 Vitamin D deficiency, unspecified: Secondary | ICD-10-CM | POA: Insufficient documentation

## 2015-06-28 DIAGNOSIS — F17200 Nicotine dependence, unspecified, uncomplicated: Secondary | ICD-10-CM | POA: Insufficient documentation

## 2015-06-30 DIAGNOSIS — E781 Pure hyperglyceridemia: Secondary | ICD-10-CM | POA: Insufficient documentation

## 2015-06-30 DIAGNOSIS — E782 Mixed hyperlipidemia: Secondary | ICD-10-CM | POA: Insufficient documentation

## 2015-09-06 ENCOUNTER — Emergency Department (HOSPITAL_COMMUNITY): Payer: Medicaid Other

## 2015-09-06 ENCOUNTER — Emergency Department (HOSPITAL_COMMUNITY)
Admission: EM | Admit: 2015-09-06 | Discharge: 2015-09-06 | Disposition: A | Discharge status: Home / Self Care | Payer: Medicaid Other | Attending: Emergency Medicine | Admitting: Emergency Medicine

## 2015-09-06 ENCOUNTER — Encounter (HOSPITAL_COMMUNITY): Payer: Self-pay | Admitting: *Deleted

## 2015-09-06 DIAGNOSIS — Z87442 Personal history of urinary calculi: Secondary | ICD-10-CM | POA: Diagnosis not present

## 2015-09-06 DIAGNOSIS — Z79899 Other long term (current) drug therapy: Secondary | ICD-10-CM | POA: Insufficient documentation

## 2015-09-06 DIAGNOSIS — Z880 Allergy status to penicillin: Secondary | ICD-10-CM | POA: Diagnosis not present

## 2015-09-06 DIAGNOSIS — K219 Gastro-esophageal reflux disease without esophagitis: Secondary | ICD-10-CM | POA: Diagnosis not present

## 2015-09-06 DIAGNOSIS — Z720 Tobacco use: Secondary | ICD-10-CM | POA: Diagnosis not present

## 2015-09-06 DIAGNOSIS — Z7982 Long term (current) use of aspirin: Secondary | ICD-10-CM | POA: Insufficient documentation

## 2015-09-06 DIAGNOSIS — K589 Irritable bowel syndrome without diarrhea: Secondary | ICD-10-CM | POA: Insufficient documentation

## 2015-09-06 DIAGNOSIS — G43909 Migraine, unspecified, not intractable, without status migrainosus: Secondary | ICD-10-CM | POA: Diagnosis not present

## 2015-09-06 DIAGNOSIS — G8929 Other chronic pain: Secondary | ICD-10-CM | POA: Diagnosis not present

## 2015-09-06 DIAGNOSIS — Y9389 Activity, other specified: Secondary | ICD-10-CM | POA: Diagnosis not present

## 2015-09-06 DIAGNOSIS — Y9289 Other specified places as the place of occurrence of the external cause: Secondary | ICD-10-CM | POA: Diagnosis not present

## 2015-09-06 DIAGNOSIS — M797 Fibromyalgia: Secondary | ICD-10-CM | POA: Diagnosis not present

## 2015-09-06 DIAGNOSIS — I10 Essential (primary) hypertension: Secondary | ICD-10-CM | POA: Diagnosis not present

## 2015-09-06 DIAGNOSIS — Y998 Other external cause status: Secondary | ICD-10-CM | POA: Diagnosis not present

## 2015-09-06 DIAGNOSIS — W108XXA Fall (on) (from) other stairs and steps, initial encounter: Secondary | ICD-10-CM | POA: Diagnosis not present

## 2015-09-06 DIAGNOSIS — Z8639 Personal history of other endocrine, nutritional and metabolic disease: Secondary | ICD-10-CM | POA: Insufficient documentation

## 2015-09-06 DIAGNOSIS — Z87448 Personal history of other diseases of urinary system: Secondary | ICD-10-CM | POA: Insufficient documentation

## 2015-09-06 DIAGNOSIS — M25571 Pain in right ankle and joints of right foot: Secondary | ICD-10-CM

## 2015-09-06 DIAGNOSIS — G40909 Epilepsy, unspecified, not intractable, without status epilepticus: Secondary | ICD-10-CM | POA: Insufficient documentation

## 2015-09-06 DIAGNOSIS — S99911A Unspecified injury of right ankle, initial encounter: Secondary | ICD-10-CM | POA: Diagnosis not present

## 2015-09-06 DIAGNOSIS — G2581 Restless legs syndrome: Secondary | ICD-10-CM | POA: Insufficient documentation

## 2015-09-06 DIAGNOSIS — F419 Anxiety disorder, unspecified: Secondary | ICD-10-CM | POA: Insufficient documentation

## 2015-09-06 NOTE — Discharge Instructions (Signed)
Elevate your leg. Use ice packs for pain and swelling. Wear the ankle support.  Continue your pain medications. Recheck if still painful in the next week.     Cryotherapy Cryotherapy is when you put ice on your injury. Ice helps lessen pain and puffiness (swelling) after an injury. Ice works the best when you start using it in the first 24 to 48 hours after an injury. HOME CARE  Put a dry or damp towel between the ice pack and your skin.  You may press gently on the ice pack.  Leave the ice on for no more than 10 to 20 minutes at a time.  Check your skin after 5 minutes to make sure your skin is okay.  Rest at least 20 minutes between ice pack uses.  Stop using ice when your skin loses feeling (numbness).  Do not use ice on someone who cannot tell you when it hurts. This includes small children and people with memory problems (dementia). GET HELP RIGHT AWAY IF:  You have white spots on your skin.  Your skin turns blue or pale.  Your skin feels waxy or hard.  Your puffiness gets worse. MAKE SURE YOU:   Understand these instructions.  Will watch your condition.  Will get help right away if you are not doing well or get worse.   This information is not intended to replace advice given to you by your health care provider. Make sure you discuss any questions you have with your health care provider.   Document Released: 04/24/2008 Document Revised: 01/29/2012 Document Reviewed: 06/29/2011 Elsevier Interactive Patient Education [employer]2016 Elsevier Inc.

## 2015-09-06 NOTE — ED Notes (Signed)
Pt states she fell down some stairs and twisted her right ankle; pt is c/o pain to outer ankle, no swelling or bruising noted

## 2015-09-06 NOTE — ED Provider Notes (Signed)
CSN: [id number]     Arrival date & time 09/06/15  2212 History  By signing my name below, I, Isabella Buchanan, attest that this documentation has been prepared under the direction and in the presence of Isabella AlbeIva Kekai Geter, MD at 2313. Electronically Signed: [name], ED Scribe. 09/06/2015. 11:21 PM.    Chief Complaint  Patient presents with  . Ankle Injury   The history is provided by the patient. No language interpreter was used.   HPI Comments: Isabella Buchanan is a 34 y.o. female smoker at 1.5 ppd who presents to the Emergency Department complaining of an injury to the right ankle sustained 2 hours ago. Pt states she slipped down the last 2 stairs on a stairwell and landed twisting  her right ankle. She did not fall to the floor.  She denies hitting her head or LOC. She reports associated pain to the ankle which radiates up into the calf mid way. She notes she is on Mobic (15 mg), Hydrocodone 535, and 500 mg Parafon. Pt denies right knee pain.  PCP Dr Elwyn ReachBooth, [facility] in [place] No orthopedist  Past Medical History  Diagnosis Date  . Anxiety   . Fibromyalgia   . Restless leg   . Chronic back pain   . GERD (gastroesophageal reflux disease)   . Headache(784.0)     mygrains  . PONV (postoperative nausea and vomiting)   . Interstitial cystitis   . Polycystic ovarian syndrome   . Hiatal hernia   . Seizures (HCC)   . IBS (irritable bowel syndrome)   . Chronic pain   . Chronic flank pain   . History of electroencephalogram 11/2013    normal EEG, "psychogenic nonepileptic spell" during EEG  . Kidney stones   . Hypertension    Past Surgical History  Procedure Laterality Date  . Cholecystectomy    . Tonsillectomy    . Tubal ligation    . Tympanoplasty    . Esophagogastroduodenoscopy endoscopy     Family History  Problem Relation Age of Onset  . Hypertension Father   . Diabetes Father   . Cancer Other   . Diabetes Other   . Seizures Father    Social History   Substance Use Topics  . Smoking status: Current Every Day Smoker -- 1.00 packs/day for 15 years    Types: Cigarettes  . Smokeless tobacco: Never Used  . Alcohol Use: Yes     Comment: rarely  smokes 1 1/2 ppd unemployed  OB History    Gravida Para Term Preterm AB TAB SAB Ectopic Multiple Living   2 2  2      2      Review of Systems  Musculoskeletal: Positive for arthralgias.  Neurological: Negative for syncope.  All other systems reviewed and are negative.   Allergies  Omnicef; Celebrex; Codeine; Sulfa antibiotics; Amoxicillin; and Penicillins  Home Medications   Prior to Admission medications   Medication Sig Start Date End Date Taking? Authorizing Provider  aspirin 325 MG tablet Take 650 mg by mouth daily as needed for mild pain.   Yes Historical Provider, MD  atorvastatin (LIPITOR) 20 MG tablet Take 20 mg by mouth daily. 08/22/15  Yes Historical Provider, MD  chlorzoxazone (PARAFON) 500 MG tablet Take 500 mg by mouth 2 (two) times daily as needed. For muscle spasms   Yes Historical Provider, MD  citalopram (CELEXA) 20 MG tablet Take 20 mg by mouth at bedtime.    Yes Historical Provider, MD  clonazePAM Scarlette Calico(KLONOPIN)  0.5 MG tablet Take 0.5 mg by mouth daily as needed for anxiety.   Yes Historical Provider, MD  diphenhydramine-acetaminophen (TYLENOL PM) 25-500 MG TABS Take 3 tablets by mouth at bedtime as needed (pain/sleep).    Yes Historical Provider, MD  fluconazole (DIFLUCAN) 150 MG tablet Take 150 mg by mouth every 3 (three) days. 08/30/15 09/06/15 Yes Historical Provider, MD  HYDROcodone-acetaminophen (NORCO/VICODIN) 5-325 MG per tablet Take 1 tablet by mouth every 4 (four) hours as needed. 06/11/15  Yes Ivery Quale, PA-C  pantoprazole (PROTONIX) 40 MG tablet Take 40 mg by mouth daily. 08/23/15  Yes Historical Provider, MD  pramipexole (MIRAPEX) 1 MG tablet Take 1 mg by mouth at bedtime.   Yes Historical Provider, MD  promethazine (PHENERGAN) 25 MG tablet Take 1 tablet by  mouth every 4 (four) hours as needed. For nausea and vomiting 08/23/15  Yes Historical Provider, MD  triamterene-hydrochlorothiazide (MAXZIDE-25) 37.5-25 MG per tablet Take 1 tablet by mouth daily.   Yes Historical Provider, MD  Vitamin D, Ergocalciferol, (DRISDOL) 50000 UNITS CAPS capsule Take 1 capsule by mouth 2 (two) times a week. 08/22/15  Yes Historical Provider, MD  ibuprofen (ADVIL,MOTRIN) 800 MG tablet Take 1 tablet (800 mg total) by mouth 3 (three) times daily. Patient not taking: Reported on 09/06/2015 06/11/15   Ivery Quale, PA-C   BP 130/86 mmHg  Pulse 109  Temp(Src) 97.5 F (36.4 C) (Oral)  Resp 16  Ht  (1.676 m)  Wt 198 lb (89.812 kg)  BMI 31.97 kg/m2  SpO2 99%  LMP 07/12/2015  Vital signs normal except for tachycardia  Physical Exam  Constitutional: She is oriented to person, place, and time. She appears well-developed and well-nourished.  Non-toxic appearance. She does not appear ill. No distress.  HENT:  Head: Normocephalic and atraumatic.  Right Ear: External ear normal.  Left Ear: External ear normal.  Nose: Nose normal. No mucosal edema or rhinorrhea.  Mouth/Throat: Mucous membranes are normal. No dental abscesses or uvula swelling.  Eyes: Conjunctivae and EOM are normal.  Neck: Normal range of motion and full passive range of motion without pain.  Pulmonary/Chest: Effort normal. No respiratory distress. She has no rhonchi. She exhibits no crepitus.  Abdominal: Normal appearance. There is no rebound.  Musculoskeletal: Normal range of motion. She exhibits tenderness. She exhibits no edema.  Moves all extremities well. R leg: TTP over both malleoli over the ankle, no obvious swelling or deformity, no bruising, non-tender knee and lower leg, nontender foot.   Neurological: She is alert and oriented to person, place, and time. She has normal strength. No cranial nerve deficit.  Skin: Skin is warm, dry and intact. No rash noted. No erythema. No pallor.   Psychiatric: She has a normal mood and affect. Her speech is normal and behavior is normal. Her mood appears not anxious.  Nursing note and vitals reviewed.   ED Course  Procedures  DIAGNOSTIC STUDIES: Oxygen Saturation is 99% on RA, normal by my interpretation.    COORDINATION OF CARE: 11:19 PM - Discussed normal XR. Discussed plans to order an ankle brace. Pt advised of plan for treatment and pt agrees.  Pt was placed in an ASO. She is already on Mobic, hydrocodone and parafon which she can take for pain.   Review of the [place] shows patient gets #120 hydrocodone 5/325 monthly, they were last filled September 21. They are prescribed by Dr. Wallene Huh in [place]. She also gets approximately 30 clonazepam 0.5 mg tablets monthly,  and they were last filled on September 19. These are prescribed by Dr. Gerilyn Pilgrim.  Labs Review Labs Reviewed - No data to display  Imaging Review Dg Ankle Complete Right  09/06/2015  CLINICAL DATA:  Larey Seat down stairs and twisted right ankle, with right ankle pain. Initial encounter. EXAM: RIGHT ANKLE - COMPLETE 3+ VIEW COMPARISON:  None. FINDINGS: There is no evidence of fracture or dislocation. The ankle mortise is intact; the interosseous space is within normal limits. No talar tilt or subluxation is seen. The joint spaces are preserved. No significant soft tissue abnormalities are seen. IMPRESSION: No evidence of fracture or dislocation. Electronically Signed   By: Roanna Raider M.D.   On: 09/06/2015 22:41   I have personally reviewed and evaluated these images and lab results as part of my medical decision-making.   EKG Interpretation None      MDM   Final diagnoses:  Acute ankle pain, right   Plan discharge  Isabella Albe, MD, FACEP   I personally performed the services described in this documentation, which was scribed in my presence. The recorded information has been reviewed and considered.  Isabella Albe, MD, Concha Pyo, MD 09/07/15 [phone]

## 2016-05-04 ENCOUNTER — Ambulatory Visit: Payer: Medicaid Other | Admitting: Cardiology

## 2016-05-19 ENCOUNTER — Ambulatory Visit: Payer: Medicaid Other | Admitting: Cardiology

## 2016-05-31 NOTE — Progress Notes (Signed)
Cardiology Office Note    Date:  05/31/2016   ID:  Isabella Buchanan, DOB 01-05-1981, MRN [id number]  PCP:  Ladora DanielBEAL, SHERI, PA-C  Cardiologist:  Olga MillersBrian Daelynn Blower, MD   Chief Complaint  Patient presents with  . Shortness of Breath    New patient evaluation    History of Present Illness:  Isabella Buchanan is a 35 y.o. female for evaluation of dyspnea.     Past Medical History  Diagnosis Date  . Anxiety   . Fibromyalgia   . Restless leg   . Chronic back pain   . GERD (gastroesophageal reflux disease)   . Headache(784.0)     mygrains  . PONV (postoperative nausea and vomiting)   . Interstitial cystitis   . Polycystic ovarian syndrome   . Hiatal hernia   . Seizures (HCC)   . IBS (irritable bowel syndrome)   . Chronic pain   . Chronic flank pain   . History of electroencephalogram 11/2013    normal EEG, "psychogenic nonepileptic spell" during EEG  . Kidney stones   . Hypertension     Past Surgical History  Procedure Laterality Date  . Cholecystectomy    . Tonsillectomy    . Tubal ligation    . Tympanoplasty    . Esophagogastroduodenoscopy endoscopy      Current Medications: Outpatient Prescriptions Prior to Visit  Medication Sig Dispense Refill  . aspirin 325 MG tablet Take 650 mg by mouth daily as needed for mild pain.    Marland Kitchen. atorvastatin (LIPITOR) 20 MG tablet Take 20 mg by mouth daily.  5  . chlorzoxazone (PARAFON) 500 MG tablet Take 500 mg by mouth 2 (two) times daily as needed. For muscle spasms    . citalopram (CELEXA) 20 MG tablet Take 20 mg by mouth at bedtime.     . clonazePAM (KLONOPIN) 0.5 MG tablet Take 0.5 mg by mouth daily as needed for anxiety.    . diphenhydramine-acetaminophen (TYLENOL PM) 25-500 MG TABS Take 3 tablets by mouth at bedtime as needed (pain/sleep).     Marland Kitchen. HYDROcodone-acetaminophen (NORCO/VICODIN) 5-325 MG per tablet Take 1 tablet by mouth every 4 (four) hours as needed. 15 tablet 0  . HYDROcodone-acetaminophen (NORCO/VICODIN) 5-325 MG  tablet Take 1 tablet by mouth every 6 (six) hours as needed for moderate pain (Gets #120 monthly from [place]ferd [place]ery [place]    Marland Kitchen. ibuprofen (ADVIL,MOTRIN) 800 MG tablet Take 1 tablet (800 mg total) by mouth 3 (three) times daily. (Patient not taking: Reported on 09/06/2015) 21 tablet 0  . meloxicam (MOBIC) 15 MG tablet Take 15 mg by mouth daily.    . pantoprazole (PROTONIX) 40 MG tablet Take 40 mg by mouth daily.  2  . pramipexole (MIRAPEX) 1 MG tablet Take 1 mg by mouth at bedtime.    . promethazine (PHENERGAN) 25 MG tablet Take 1 tablet by mouth every 4 (four) hours as needed. For nausea and vomiting  0  . triamterene-hydrochlorothiazide (MAXZIDE-25) 37.5-25 MG per tablet Take 1 tablet by mouth daily.    . Vitamin D, Ergocalciferol, (DRISDOL) 50000 UNITS CAPS capsule Take 1 capsule by mouth 2 (two) times a week.  3   No facility-administered medications prior to visit.     Allergies:   Omnicef; Celebrex; Codeine; Sulfa antibiotics; Amoxicillin; and Penicillins   Social History   Social History  . Marital Status: Single    Spouse Name: N/A  . Number of Children: N/A  . Years of Education: N/A  Social History Main Topics  . Smoking status: Current Every Day Smoker -- 1.00 packs/day for 15 years    Types: Cigarettes  . Smokeless tobacco: Never Used  . Alcohol Use: Yes     Comment: rarely  . Drug Use: No  . Sexual Activity: Yes    Birth Control/ Protection: Surgical   Other Topics Concern  . Not on file   Social History Narrative     Family History:  The patient's family history includes Cancer in her other; Diabetes in her father and other; Hypertension in her father; Seizures in her father.   ROS:   Please see the history of present illness.    No weight loss, productive cough, hemoptysis, dysphagia, odynophagia, melena, hematochezia, dysuria, hematuria, rash, seizure activity, orthopnea, PND, pedal edema, claudication. All remaining systems negative.   PHYSICAL EXAM:    VS:  There were no vitals taken for this visit.   GEN: Well nourished, well developed, in no acute distress HEENT: normal Neck: no JVD, carotid bruits, or masses Cardiac: RRR; no murmurs, rubs, or gallops,no edema  Respiratory:  clear to auscultation bilaterally, normal work of breathing GI: soft, nontender, nondistended, + BS MS: no deformity or atrophy Skin: warm and dry, no rash Neuro:  Alert and Oriented x 3, Strength and sensation are intact Psych: euthymic mood, full affect  Wt Readings from Last 3 Encounters:  09/06/15 198 lb (89.812 kg)  06/11/15 213 lb (96.616 kg)  03/21/15 210 lb (95.255 kg)      Studies/Labs Reviewed:   EKG:  EKG   Recent Labs: No results found for requested labs within last 365 days.   Lipid Panel No results found for: CHOL, TRIG, HDL, CHOLHDL, VLDL, LDLCALC, LDLDIRECT  Additional studies/ records that were reviewed today include:      ASSESSMENT:    No diagnosis found.   PLAN:  In order of problems listed above:  1     Medication Adjustments/Labs and Tests Ordered: Current medicines are reviewed at length with the patient today.  Concerns regarding medicines are outlined above.  Medication changes, Labs and Tests ordered today are listed in the Patient Instructions below. There are no Patient Instructions on file for this visit.   Signed, Olga Millers, MD  05/31/2016 12:27 PM    [facility]    This encounter was created in error - please disregard.

## 2016-06-06 ENCOUNTER — Encounter: Payer: Medicaid Other | Admitting: Cardiology

## 2016-07-10 ENCOUNTER — Ambulatory Visit: Payer: Medicaid Other | Admitting: Cardiovascular Disease

## 2016-07-10 NOTE — Progress Notes (Deleted)
Cardiology Office Note   Date:  07/10/2016   ID:  Isabella BrasilCrystal M Buchanan, DOB 02/12/1981, MRN [id number]  PCP:  Ladora DanielBEAL, SHERI, PA-C  Cardiologist:   Chilton Siiffany [place], MD   No chief complaint on file.     History of Present Illness: Casady Allyson SabalM Rhein is a 35 y.o. female who presents for ***    Past Medical History:  Diagnosis Date  . Anxiety   . Chronic back pain   . Chronic flank pain   . Chronic pain   . Fibromyalgia   . GERD (gastroesophageal reflux disease)   . Headache(784.0)    mygrains  . Hiatal hernia   . History of electroencephalogram 11/2013   normal EEG, "psychogenic nonepileptic spell" during EEG  . Hypertension   . IBS (irritable bowel syndrome)   . Interstitial cystitis   . Kidney stones   . Polycystic ovarian syndrome   . PONV (postoperative nausea and vomiting)   . Restless leg   . Seizures (HCC)     Past Surgical History:  Procedure Laterality Date  . CHOLECYSTECTOMY    . ESOPHAGOGASTRODUODENOSCOPY ENDOSCOPY    . TONSILLECTOMY    . TUBAL LIGATION    . TYMPANOPLASTY       Current Outpatient Prescriptions  Medication Sig Dispense Refill  . aspirin 325 MG tablet Take 650 mg by mouth daily as needed for mild pain.    Marland Kitchen. atorvastatin (LIPITOR) 20 MG tablet Take 20 mg by mouth daily.  5  . chlorzoxazone (PARAFON) 500 MG tablet Take 500 mg by mouth 2 (two) times daily as needed. For muscle spasms    . citalopram (CELEXA) 20 MG tablet Take 20 mg by mouth at bedtime.     . clonazePAM (KLONOPIN) 0.5 MG tablet Take 0.5 mg by mouth daily as needed for anxiety.    . diphenhydramine-acetaminophen (TYLENOL PM) 25-500 MG TABS Take 3 tablets by mouth at bedtime as needed (pain/sleep).     Marland Kitchen. HYDROcodone-acetaminophen (NORCO/VICODIN) 5-325 MG per tablet Take 1 tablet by mouth every 4 (four) hours as needed. 15 tablet 0  . HYDROcodone-acetaminophen (NORCO/VICODIN) 5-325 MG tablet Take 1 tablet by mouth every 6 (six) hours as needed for moderate pain (Gets #120  monthly from Dr [place]ferd [place]ate[place]ery [place]).    Marland Kitchen. ibuprofen (ADVIL,MOTRIN) 800 MG tablet Take 1 tablet (800 mg total) by mouth 3 (three) times daily. (Patient not taking: Reported on 09/06/2015) 21 tablet 0  . meloxicam (MOBIC) 15 MG tablet Take 15 mg by mouth daily.    . pantoprazole (PROTONIX) 40 MG tablet Take 40 mg by mouth daily.  2  . pramipexole (MIRAPEX) 1 MG tablet Take 1 mg by mouth at bedtime.    . promethazine (PHENERGAN) 25 MG tablet Take 1 tablet by mouth every 4 (four) hours as needed. For nausea and vomiting  0  . triamterene-hydrochlorothiazide (MAXZIDE-25) 37.5-25 MG per tablet Take 1 tablet by mouth daily.    . Vitamin D, Ergocalciferol, (DRISDOL) 50000 UNITS CAPS capsule Take 1 capsule by mouth 2 (two) times a week.  3   No current facility-administered medications for this visit.     Allergies:   Omnicef [cefdinir]; Celebrex [celecoxib]; Codeine; Sulfa antibiotics; Amoxicillin; and Penicillins    Social History:  The patient  reports that she has been smoking Cigarettes.  She has a 15.00 pack-year smoking history. She has never used smokeless tobacco. She reports that she drinks alcohol. She reports that she does not use drugs.  Family History:  The patient's ***family history includes Cancer in her other; Diabetes in her father and other; Hypertension in her father; Seizures in her father.    ROS:  Please see the history of present illness.   Otherwise, review of systems are positive for {NONE DEFAULTED:18576::"none"}.   All other systems are reviewed and negative.    PHYSICAL EXAM: VS:  There were no vitals taken for this visit. , BMI There is no height or weight on file to calculate BMI. GENERAL:  Well appearing HEENT:  Pupils equal round and reactive, fundi not visualized, oral mucosa unremarkable NECK:  No jugular venous distention, waveform within normal limits, carotid upstroke brisk and symmetric, no bruits, no thyromegaly LYMPHATICS:  No cervical adenopathy LUNGS:   Clear to auscultation bilaterally HEART:  RRR.  PMI not displaced or sustained,S1 and S2 within normal limits, no S3, no S4, no clicks, no rubs, *** murmurs ABD:  Flat, positive bowel sounds normal in frequency in pitch, no bruits, no rebound, no guarding, no midline pulsatile mass, no hepatomegaly, no splenomegaly EXT:  2 plus pulses throughout, no edema, no cyanosis no clubbing SKIN:  No rashes no nodules NEURO:  Cranial nerves II through XII grossly intact, motor grossly intact throughout PSYCH:  Cognitively intact, oriented to person place and time    EKG:  EKG {ACTION; IS/IS [id number]}OT:[id number]} ordered today. The ekg ordered today demonstrates ***   Recent Labs: No results found for requested labs within last 8760 hours.    Lipid Panel No results found for: CHOL, TRIG, HDL, CHOLHDL, VLDL, LDLCALC, LDLDIRECT    Wt Readings from Last 3 Encounters:  09/06/15 198 lb (89.8 kg)  06/11/15 213 lb (96.6 kg)  03/21/15 210 lb (95.3 kg)      ASSESSMENT AND PLAN:  ***   Current medicines are reviewed at length with the patient today.  The patient {ACTIONS; HAS/DOES NOT HAVE:19233} concerns regarding medicines.  The following changes have been made:  {PLAN; NO CHANGE:13088:s}  Labs/ tests ordered today include: *** No orders of the defined types were placed in this encounter.    Disposition:   FU with ***    This note was written with the assistance of speech recognition software.  Please excuse any transcriptional errors.  Signed, Findley Blankenbaker C. Duke Salviaandolph, MD, [facility]  07/10/2016 8:48 AM    [facility]

## 2016-08-02 ENCOUNTER — Encounter: Payer: Self-pay | Admitting: Cardiovascular Disease

## 2016-08-02 ENCOUNTER — Ambulatory Visit (INDEPENDENT_AMBULATORY_CARE_PROVIDER_SITE_OTHER): Payer: Medicaid Other | Admitting: Cardiovascular Disease

## 2016-08-02 VITALS — BP 124/84 | HR 87 | Wt 211.6 lb

## 2016-08-02 DIAGNOSIS — E781 Pure hyperglyceridemia: Secondary | ICD-10-CM | POA: Insufficient documentation

## 2016-08-02 DIAGNOSIS — E669 Obesity, unspecified: Secondary | ICD-10-CM

## 2016-08-02 DIAGNOSIS — I10 Essential (primary) hypertension: Secondary | ICD-10-CM | POA: Diagnosis not present

## 2016-08-02 DIAGNOSIS — R600 Localized edema: Secondary | ICD-10-CM | POA: Diagnosis not present

## 2016-08-02 DIAGNOSIS — E668 Other obesity: Secondary | ICD-10-CM | POA: Diagnosis not present

## 2016-08-02 DIAGNOSIS — R0602 Shortness of breath: Secondary | ICD-10-CM

## 2016-08-02 DIAGNOSIS — F17210 Nicotine dependence, cigarettes, uncomplicated: Secondary | ICD-10-CM | POA: Insufficient documentation

## 2016-08-02 DIAGNOSIS — Z720 Tobacco use: Secondary | ICD-10-CM

## 2016-08-02 NOTE — Patient Instructions (Signed)
Medication Instructions: Dr Royann Shiversroitoru recommends that you continue on your current medications as directed. Please refer to the Current Medication list given to you today.  Labwork: Your physician recommends that you return for lab work TODAY.  Testing/Procedures: 1. Echocardiogram - Your physician has requested that you have an echocardiogram. Echocardiography is a painless test that uses sound waves to create images of your heart. It provides your doctor with information about the size and shape of your heart and how well your heart's chambers and valves are working. This procedure takes approximately one hour. There are no restrictions for this procedure. [facility] [address], [address].  2. Lower Extremity Venous Doppler/Reflux Study - Your physician has requested that you have a lower or upper extremity venous duplex. This test is an ultrasound of the veins in the legs or arms. It looks at venous blood flow that carries blood from the heart to the legs or arms. Allow one hour for a Lower Venous exam. Allow thirty minutes for an Upper Venous exam. There are no restrictions or special instructions.  Follow-up: Dr Royann Shiversroitoru recommends that you follow-up with him as needed.  If you need a refill on your cardiac medications before your next appointment, please call your pharmacy.

## 2016-08-02 NOTE — Progress Notes (Signed)
Cardiology consultation Note    Date:  08/02/2016   ID:  Isabella Buchanan, DOB 30-Oct-1981, MRN [id number]  PCP:  Ladora Daniel, PA-C  Cardiologist:   Thurmon Fair, MD  Reason for consultation: Chronic bilateral leg edema   Chief Complaint  Patient presents with  . New Patient (Initial Visit)    dizziness when getting up and down. edema; in hands,ankles and legs    History of Present Illness:  Isabella Buchanan is a 35 y.o. female who has had problems with bilateral leg swelling for several years. He believes that initially for at least 4 or 5 years, maybe longer and that it's getting worse. Furosemide used to help with the swelling but no longer works. She denies any problems with skin ulcers or sores, but complains of paresthesias and stinging discomfort in both feet. The swelling is always worse at the end of a long day, especially standing a lot. She states that when the swelling is very bad, she may also become a little short of breath. She has not noticed the connection with salty foods.  After an office visit in May 2017, her proBNP was elevated at 227. Chest x-ray did not show cardiomegaly or pulmonary congestion. She has never had an echocardiogram.  Denies angina, palpitations, syncope, focal neurological complaints, bleeding problems. She smokes up to 2 packs per day due to emotional stress.  She has had systemic hypertension for about 3 or 4 years and it has been well controlled. She has noticed spider veins in her legs for a long time. She has been taking different nonsteroidal anti-inflammatory drugs for several years. She was initially diagnosed with fibromyalgia but now is known to have an L5-S1 herniated disc. She wants to avoid surgery at all costs. She was prescribed a cough and back, but found that over-the-counter ibuprofen works better. She has a history of nephrolithiasis and interstitial cystitis and has seen a urologist, but has never been evaluated by a  nephrologist.  Past Medical History:  Diagnosis Date  . Anxiety   . Chronic back pain   . Chronic flank pain   . Chronic pain   . DOE (dyspnea on exertion)   . Fibromyalgia   . GERD (gastroesophageal reflux disease)   . Headache(784.0)    mygrains  . Hiatal hernia   . History of electroencephalogram 11/2013   normal EEG, "psychogenic nonepileptic spell" during EEG  . Hypertension   . IBS (irritable bowel syndrome)   . Interstitial cystitis   . Kidney stones   . Polycystic ovarian syndrome   . PONV (postoperative nausea and vomiting)   . Restless leg   . Seizures (HCC)   . Swelling     Past Surgical History:  Procedure Laterality Date  . CHOLECYSTECTOMY    . ESOPHAGOGASTRODUODENOSCOPY ENDOSCOPY    . TONSILLECTOMY    . TUBAL LIGATION    . TYMPANOPLASTY      Current Medications: Outpatient Medications Prior to Visit  Medication Sig Dispense Refill  . aspirin 325 MG tablet Take 650 mg by mouth daily as needed for mild pain.    . chlorzoxazone (PARAFON) 500 MG tablet Take 500 mg by mouth 2 (two) times daily as needed. For muscle spasms    . clonazePAM (KLONOPIN) 0.5 MG tablet Take 0.5 mg by mouth daily as needed for anxiety.    . diphenhydramine-acetaminophen (TYLENOL PM) 25-500 MG TABS Take 3 tablets by mouth at bedtime as needed (pain/sleep).     Marland Kitchen HYDROcodone-acetaminophen (  NORCO/VICODIN) 5-325 MG per tablet Take 1 tablet by mouth every 4 (four) hours as needed. 15 tablet 0  . ibuprofen (ADVIL,MOTRIN) 800 MG tablet Take 1 tablet (800 mg total) by mouth 3 (three) times daily. 21 tablet 0  . meloxicam (MOBIC) 15 MG tablet Take 15 mg by mouth daily.    . pantoprazole (PROTONIX) 40 MG tablet Take 40 mg by mouth daily.  2  . pramipexole (MIRAPEX) 1 MG tablet Take 1 mg by mouth at bedtime.    . promethazine (PHENERGAN) 25 MG tablet Take 1 tablet by mouth every 4 (four) hours as needed. For nausea and vomiting  0  . valACYclovir (VALTREX) 1000 MG tablet Take 1,000 mg by mouth 2  (two) times daily.    . Vitamin D, Ergocalciferol, (DRISDOL) 50000 UNITS CAPS capsule Take 1 capsule by mouth 2 (two) times a week.  3  . atorvastatin (LIPITOR) 20 MG tablet Take 20 mg by mouth daily.  5  . citalopram (CELEXA) 20 MG tablet Take 20 mg by mouth at bedtime.     . furosemide (LASIX) 20 MG tablet Take 20 mg by mouth.    Marland Kitchen. HYDROcodone-acetaminophen (NORCO/VICODIN) 5-325 MG tablet Take 1 tablet by mouth every 6 (six) hours as needed for moderate pain (Gets #120 monthly from [place]    Marland Kitchen. triamterene-hydrochlorothiazide (MAXZIDE-25) 37.5-25 MG per tablet Take 1 tablet by mouth daily.     No facility-administered medications prior to visit.      Allergies:   Omnicef [cefdinir]; Amoxicillin; Celebrex [celecoxib]; Codeine; Penicillins; and Sulfa antibiotics   Social History   Social History  . Marital status: Single    Spouse name: N/A  . Number of children: N/A  . Years of education: N/A   Social History Main Topics  . Smoking status: Current Every Day Smoker    Packs/day: 1.00    Years: 15.00    Types: Cigarettes  . Smokeless tobacco: Never Used  . Alcohol use Yes     Comment: rarely  . Drug use: No  . Sexual activity: Yes    Birth control/ protection: Surgical   Other Topics Concern  . None   Social History Narrative  . None     Family History:  The patient's family history includes Cancer in her other; Diabetes in her father and other; Hypertension in her father; Seizures in her father.   ROS:   Please see the history of present illness.    ROS All other systems reviewed and are negative.   PHYSICAL EXAM:   VS:  BP 124/84   Pulse 87   Wt 211 lb 9.6 oz (96 kg)   BMI 34.15 kg/m    GEN: Well nourished, well developed, in no acute distress  HEENT: normal  Neck: no JVD, carotid bruits, or masses Cardiac: RRR; no murmurs, rubs, or gallops, 2+ pitting symmetrical pretibial edema , prominent superficial varicose veins, especially in the upper  left shin Respiratory:  clear to auscultation bilaterally, normal work of breathing GI: soft, nontender, nondistended, + BS MS: no deformity or atrophy  Skin: warm and dry, no rash Neuro:  Alert and Oriented x 3, Strength and sensation are intact Psych: euthymic mood, full affect  Wt Readings from Last 3 Encounters:  08/02/16 211 lb 9.6 oz (96 kg)  09/06/15 198 lb (89.8 kg)  06/11/15 213 lb (96.6 kg)      Studies/Labs Reviewed:   EKG:  EKG is ordered today.  The ekg  ordered today demonstrates NSR, normal ECG  Recent Labs: 03/29/2016 care everywhere: Pro BNP 277 (normal 0-130) WBC 13.2, hemoglobin 14.8, platelets 390 Glucose 90, BUN 11, creatinine 0.6, potassium 4.5, normal LFTs, TSH 1.15  03/30/2015 Total cholesterol 177, triglycerides 236, HDL 40, LDL 90 (uncertain fasting)  Additional studies/ records that were reviewed today include:  Notes from Ladora Daniel, care everywhere    ASSESSMENT:    1. Bilateral lower extremity edema   2. Shortness of breath   3. Moderate obesity   4. Essential hypertension   5. Hypertriglyceridemia without hypercholesterolemia   6. Tobacco abuse      PLAN:  In order of problems listed above:  1. Edema: Think the leading possibility is peripheral venous insufficiency. Recommended compression stockings. Will check a venous duplex ultrasound for evidence of superficial and/or deep venous reflux. He has significant superficial venous reflux consider referral to [facility]. 2. Dyspnea: Possibly related to obesity and decondtioning but will check echocardiogram. If there is evidence of systolic dysfunction or valvular abnormalities will bring back for further evaluation 3. Obesity: Regardless of results of evaluation, weight loss would be highly beneficial 4. HTN: Well controlled on current regimen. Check urinalysis to make sure she does not have edema and hypertension from a primary kidney problem such as glomerulonephritis/nephrotic  syndrome. 5. HLP: Unclear if previous lipid profile was truly fasting. If so, her triglycerides are elevated but do not require pharmacological therapy, recommend exercise and weight loss. 6. Smoking cessation strongly recommended   Medication Adjustments/Labs and Tests Ordered: Current medicines are reviewed at length with the patient today.  Concerns regarding medicines are outlined above.  Medication changes, Labs and Tests ordered today are listed in the Patient Instructions below. Patient Instructions  Medication Instructions: Dr Royann Shivers recommends that you continue on your current medications as directed. Please refer to the Current Medication list given to you today.  Labwork: Your physician recommends that you return for lab work TODAY.  Testing/Procedures: 1. Echocardiogram - Your physician has requested that you have an echocardiogram. Echocardiography is a painless test that uses sound waves to create images of your heart. It provides your doctor with information about the size and shape of your heart and how well your heart's chambers and valves are working. This procedure takes approximately one hour. There are no restrictions for this procedure. This will be performed at our [facility] location - [address], [address].  2. Lower Extremity Venous Doppler/Reflux Study - Your physician has requested that you have a lower or upper extremity venous duplex. This test is an ultrasound of the veins in the legs or arms. It looks at venous blood flow that carries blood from the heart to the legs or arms. Allow one hour for a Lower Venous exam. Allow thirty minutes for an Upper Venous exam. There are no restrictions or special instructions.  Follow-up: Dr Royann Shivers recommends that you follow-up with him as needed.  If you need a refill on your cardiac medications before your next appointment, please call your pharmacy.    Signed, Thurmon Fair, MD  08/02/2016 1:24 PM    [facility] [address] [place], [place], [place]  [address] Phone: [phone]; Fax: [phone]

## 2016-08-08 ENCOUNTER — Other Ambulatory Visit: Payer: Self-pay | Admitting: *Deleted

## 2016-08-08 DIAGNOSIS — Z79899 Other long term (current) drug therapy: Secondary | ICD-10-CM

## 2016-08-08 DIAGNOSIS — R809 Proteinuria, unspecified: Secondary | ICD-10-CM

## 2016-08-08 DIAGNOSIS — R109 Unspecified abdominal pain: Secondary | ICD-10-CM

## 2016-08-09 LAB — URINALYSIS, MICROSCOPIC ONLY
Casts: NONE SEEN [LPF]
Crystals: NONE SEEN [HPF]
WBC UA: NONE SEEN WBC/HPF (ref <=5)
Yeast: NONE SEEN [HPF]

## 2016-08-09 LAB — URINALYSIS, ROUTINE W REFLEX MICROSCOPIC
Bilirubin Urine: NEGATIVE
Glucose, UA: NEGATIVE
KETONES UR: NEGATIVE
Leukocytes, UA: NEGATIVE
Nitrite: NEGATIVE
SPECIFIC GRAVITY, URINE: 1.03 (ref 1.001–1.035)
pH: 6 (ref 5.0–8.0)

## 2016-08-09 LAB — BASIC METABOLIC PANEL
BUN: 14 mg/dL (ref 7–25)
CALCIUM: 9.7 mg/dL (ref 8.6–10.2)
CO2: 26 mmol/L (ref 20–31)
Chloride: 104 mmol/L (ref 98–110)
Creat: 0.77 mg/dL (ref 0.50–1.10)
Glucose, Bld: 130 mg/dL — ABNORMAL HIGH (ref 65–99)
Potassium: 4.2 mmol/L (ref 3.5–5.3)
Sodium: 138 mmol/L (ref 135–146)

## 2016-08-09 LAB — BRAIN NATRIURETIC PEPTIDE: Brain Natriuretic Peptide: 18.8 pg/mL (ref <100)

## 2016-08-11 ENCOUNTER — Encounter (HOSPITAL_COMMUNITY): Payer: Medicaid Other

## 2016-08-16 ENCOUNTER — Encounter (HOSPITAL_COMMUNITY): Payer: Medicaid Other

## 2016-08-16 ENCOUNTER — Inpatient Hospital Stay (HOSPITAL_COMMUNITY): Admission: RE | Admit: 2016-08-16 | Payer: Medicaid Other | Source: Ambulatory Visit

## 2016-08-18 ENCOUNTER — Other Ambulatory Visit (HOSPITAL_COMMUNITY): Payer: Medicaid Other

## 2016-11-03 ENCOUNTER — Encounter (HOSPITAL_COMMUNITY): Payer: Self-pay | Admitting: Emergency Medicine

## 2016-11-03 ENCOUNTER — Emergency Department (HOSPITAL_COMMUNITY)
Admission: EM | Admit: 2016-11-03 | Discharge: 2016-11-03 | Disposition: A | Discharge status: Home / Self Care | Payer: Medicaid Other | Attending: Emergency Medicine | Admitting: Emergency Medicine

## 2016-11-03 DIAGNOSIS — Z79899 Other long term (current) drug therapy: Secondary | ICD-10-CM | POA: Diagnosis not present

## 2016-11-03 DIAGNOSIS — J0140 Acute pansinusitis, unspecified: Secondary | ICD-10-CM | POA: Insufficient documentation

## 2016-11-03 DIAGNOSIS — R0602 Shortness of breath: Secondary | ICD-10-CM | POA: Diagnosis not present

## 2016-11-03 DIAGNOSIS — R0789 Other chest pain: Secondary | ICD-10-CM | POA: Diagnosis not present

## 2016-11-03 DIAGNOSIS — I10 Essential (primary) hypertension: Secondary | ICD-10-CM | POA: Insufficient documentation

## 2016-11-03 DIAGNOSIS — Z7982 Long term (current) use of aspirin: Secondary | ICD-10-CM | POA: Insufficient documentation

## 2016-11-03 DIAGNOSIS — F17210 Nicotine dependence, cigarettes, uncomplicated: Secondary | ICD-10-CM | POA: Insufficient documentation

## 2016-11-03 DIAGNOSIS — R05 Cough: Secondary | ICD-10-CM | POA: Diagnosis present

## 2016-11-03 MED ORDER — AZITHROMYCIN 500 MG PO TABS: 500.0000 mg | ORAL_TABLET | Freq: Every day | ORAL | 0 refills | Status: DC

## 2016-11-03 NOTE — Discharge Instructions (Signed)
I have prescribed azithromycin 500mg  for 3 days total for your sinus infection.  Continue to drink small amounts of fluids frequently to stay well hydrated.  Please follow up with your PCP within the next 1-2 days.  If your symptoms fail to improve or worsen, please follow up with your PCP or seek care.

## 2016-11-03 NOTE — ED Triage Notes (Signed)
Having cold symptoms for last 2 weeks.  C/o sore throat, ear pain bilaterally, fever, cough (dark green),bodyache and burning in chest.

## 2016-11-03 NOTE — ED Provider Notes (Signed)
[facility]EM[facility] DEPT Provider Note   CSN: [id number] Arrival date & time: 11/03/16  [id number]     History   Chief Complaint Chief Complaint  Patient presents with  . Sore Throat    HPI Isabella Buchanan is a 35 y.o. female.  HPI  Patient notes nasal and chest congestion over the last 2 weeks that is progressively worsening. She notes that then 1.5 weeks ago she started having a cough and then her ears and throat began to hurt. Cough is productive of green phlegm. She notes maxillary and frontal facial pain. 2-3 days after the cough started, she noted burning in her chest that is centrally located and associated with a cough and deep inspiration.  She notes SOB that started a few days ago. No wheezing.  She also notes diffuse myalgias for the last 3-4 days.   She began vomiting last night. She notes post-nasal drip, began coughing, and then vomited.  She denies abdominal pain.   This morning when she woke up her temperature was 102 and she took Tylenol at 8:30am.   No rhinorrhea, watery eyes, sneezing. No change in vision. Notes decreased appetite. She'd drinking okay.   She has tried Tussinex, MedinaBenadry, Nyquil, tessalon perles, and cough drops without improvement.  Past Medical History:  Diagnosis Date  . Anxiety   . Chronic back pain   . Chronic flank pain   . Chronic pain   . DOE (dyspnea on exertion)   . Fibromyalgia   . GERD (gastroesophageal reflux disease)   . Headache(784.0)    mygrains  . Hiatal hernia   . History of electroencephalogram 11/2013   normal EEG, "psychogenic nonepileptic spell" during EEG  . Hypertension   . IBS (irritable bowel syndrome)   . Interstitial cystitis   . Kidney stones   . Polycystic ovarian syndrome   . PONV (postoperative nausea and vomiting)   . Restless leg   . Seizures (HCC)   . Swelling     Patient Active Problem List   Diagnosis Date Noted  . Morbid obesity (HCC) 08/02/2016  . Hypertriglyceridemia without  hypercholesterolemia 08/02/2016  . Tobacco abuse 08/02/2016  . Essential hypertriglyceridemia 06/30/2015  . Combined fat and carbohydrate induced hyperlipemia 06/30/2015  . Nicotine addiction 06/28/2015  . Cyclic vomiting syndrome 06/28/2015  . Avitaminosis D 06/28/2015  . Essential hypertension 05/04/2015  . Degeneration of intervertebral disc of lumbar region 02/05/2015  . Anxiety, generalized 02/05/2015  . Community acquired pneumonia 07/17/2014  . CAP (community acquired pneumonia) 07/17/2014  . Sepsis (HCC) 07/17/2014  . Encephalopathy 07/17/2014  . Acute respiratory failure (HCC) 07/17/2014  . Seizure (HCC) 12/13/2013  . Seizures (HCC) 12/13/2013  . History of IBS 12/13/2013  . Fibromyalgia   . Chronic back pain   . Interstitial cystitis   . Polycystic ovarian syndrome     Past Surgical History:  Procedure Laterality Date  . CHOLECYSTECTOMY    . ESOPHAGOGASTRODUODENOSCOPY ENDOSCOPY    . TONSILLECTOMY    . TUBAL LIGATION    . TYMPANOPLASTY      OB History    Gravida Para Term Preterm AB Living   2 2   2   2    SAB TAB Ectopic Multiple Live Births                   Home Medications    Prior to Admission medications   Medication Sig Start Date End Date Taking? Authorizing Provider  aspirin 325 MG tablet Take 650 mg by  mouth daily as needed for mild pain.   Yes Historical Provider, MD  atorvastatin (LIPITOR) 20 MG tablet TAKE ONE TABLET (20 MG TOTAL) BY MOUTH DAILY. 07/25/16  Yes Historical Provider, MD  citalopram (CELEXA) 20 MG tablet Take by mouth. 01/06/16  Yes Historical Provider, MD  clonazePAM (KLONOPIN) 0.5 MG tablet Take 0.5 mg by mouth daily as needed for anxiety.   Yes Historical Provider, MD  diphenhydramine-acetaminophen (TYLENOL PM) 25-500 MG TABS Take 3 tablets by mouth at bedtime as needed (pain/sleep).    Yes Historical Provider, MD  HYDROcodone-acetaminophen (NORCO/VICODIN) 5-325 MG per tablet Take 1 tablet by mouth every 4 (four) hours as needed.  06/11/15  Yes Ivery QualeHobson Bryant, PA-C  ibuprofen (ADVIL,MOTRIN) 800 MG tablet Take 1 tablet (800 mg total) by mouth 3 (three) times daily. 06/11/15  Yes Ivery QualeHobson Bryant, PA-C  pantoprazole (PROTONIX) 40 MG tablet Take 40 mg by mouth daily. 08/23/15  Yes Historical Provider, MD  pramipexole (MIRAPEX) 1 MG tablet Take 1 mg by mouth at bedtime.   Yes Historical Provider, MD  triamterene-hydrochlorothiazide (MAXZIDE-25) 37.5-25 MG tablet Take by mouth.   Yes Historical Provider, MD  valACYclovir (VALTREX) 1000 MG tablet Take 1,000 mg by mouth 2 (two) times daily.   Yes Historical Provider, MD  azithromycin (ZITHROMAX) 500 MG tablet Take 1 tablet (500 mg total) by mouth daily. For 3 days total 11/03/16   Joanna Puffrystal S Irish Piech, MD    Family History Family History  Problem Relation Age of Onset  . Hypertension Father   . Diabetes Father   . Seizures Father   . Cancer Other   . Diabetes Other     Social History Social History  Substance Use Topics  . Smoking status: Current Every Day Smoker    Packs/day: 1.00    Years: 15.00    Types: Cigarettes  . Smokeless tobacco: Never Used  . Alcohol use Yes     Comment: rarely     Allergies   Omnicef [cefdinir]; Amoxicillin; Celebrex [celecoxib]; Codeine; Penicillins; and Sulfa antibiotics   Review of Systems Review of Systems  Constitutional: Positive for appetite change, chills, fatigue and fever. Negative for activity change.  HENT: Positive for congestion, ear pain, postnasal drip, sinus pain, sinus pressure and sore throat. Negative for drooling, ear discharge, hearing loss, rhinorrhea, sneezing, tinnitus, trouble swallowing and voice change.   Eyes: Negative for photophobia, pain, redness, itching and visual disturbance.  Respiratory: Positive for cough, chest tightness and shortness of breath. Negative for choking, wheezing and stridor.   Cardiovascular: Negative for chest pain and leg swelling.  Gastrointestinal: Positive for nausea and vomiting.  Negative for abdominal pain, constipation and diarrhea.  Musculoskeletal: Positive for myalgias.  Skin: Negative for rash.  All other systems reviewed and are negative.    Physical Exam Updated Vital Signs BP 134/91 (BP Location: Right Arm)   Pulse 99   Temp 97.7 F (36.5 C) (Oral)   Resp 16   Ht 5\' 6"  (1.676 m)   Wt 89.8 kg   LMP 11/03/2016 (Exact Date)   SpO2 98%   BMI 31.96 kg/m   Physical Exam  Constitutional: She is oriented to person, place, and time. She appears well-developed and well-nourished. No distress.  HENT:  Head: Normocephalic and atraumatic.  Right Ear: External ear normal.  Left Ear: External ear normal.  Nose: Nose normal.  Mouth/Throat: No oropharyngeal exudate.  Erythema of the posterior oropharynx. Significant maxillary and frontal sinus tenderness.  Eyes: Conjunctivae are normal. Right eye exhibits  no discharge. Left eye exhibits no discharge. No scleral icterus.  Neck: Normal range of motion.  Cardiovascular: Normal rate, regular rhythm, normal heart sounds and intact distal pulses.   No murmur heard. Pulmonary/Chest: Effort normal and breath sounds normal. No respiratory distress. She has no wheezes. She has no rales. She exhibits no tenderness.  Abdominal: Soft. Bowel sounds are normal. She exhibits no distension. There is no tenderness.  Musculoskeletal: She exhibits no edema.  Lymphadenopathy:    She has no cervical adenopathy.  Neurological: She is alert and oriented to person, place, and time. She exhibits normal muscle tone.  Skin: Skin is warm. Capillary refill takes less than 2 seconds. No rash noted. She is not diaphoretic.  Psychiatric: She has a normal mood and affect. Her behavior is normal.     ED Treatments / Results  Labs (all labs ordered are listed, but only abnormal results are displayed) Labs Reviewed - No data to display  EKG  EKG Interpretation None       Radiology No results found.  Procedures Procedures  (including critical care time)  Medications Ordered in ED Medications - No data to display   Initial Impression / Assessment and Plan / ED Course  I have reviewed the triage vital signs and the nursing notes.  Pertinent labs & imaging results that were available during my care of the patient were reviewed by me and considered in my medical decision making (see chart for details).  Clinical Course    This is a 35 y/o female without h/o lung/cardiac issue presenting with > 2weeks of congestion, cough, and tenderness of the sinuses. Exam and h/o consistent with pansinusitis. Given duration, will treat as a bacterial infection. Patient is non-toxic on exam. Lungs clear. Appears well hydrated despite 1 episode of emesis last night. Given multiple antibiotic allergies, will write for azithromycin. Discussed Flonase, patient states she prefers Afrin. Discussed rebound affect and not to use this more than 3 days. Discussed symptomatic management and reasons to seek care. Pt to f/u with PCP within the next few days.   Final Clinical Impressions(s) / ED Diagnoses   Final diagnoses:  Acute non-recurrent pansinusitis    New Prescriptions Discharge Medication List as of 11/03/2016  9:53 AM    START taking these medications   Details  azithromycin (ZITHROMAX) 500 MG tablet Take 1 tablet (500 mg total) by mouth daily. For 3 days total, Starting Fri 11/03/2016, Print         Joanna Puff, MD 11/03/16 1044    Donnetta Hutching, MD 11/04/16 2010

## 2016-12-13 ENCOUNTER — Encounter (HOSPITAL_COMMUNITY): Payer: Self-pay | Admitting: Cardiovascular Disease

## 2016-12-26 ENCOUNTER — Other Ambulatory Visit (HOSPITAL_COMMUNITY): Payer: Medicaid Other

## 2017-07-02 ENCOUNTER — Other Ambulatory Visit: Payer: Self-pay | Admitting: Pain Medicine

## 2017-07-02 DIAGNOSIS — M545 Low back pain: Secondary | ICD-10-CM

## 2017-07-11 ENCOUNTER — Other Ambulatory Visit: Payer: Medicaid Other

## 2017-08-05 ENCOUNTER — Encounter (HOSPITAL_COMMUNITY): Payer: Self-pay

## 2017-08-05 ENCOUNTER — Emergency Department (HOSPITAL_COMMUNITY)
Admission: EM | Admit: 2017-08-05 | Discharge: 2017-08-05 | Disposition: A | Discharge status: Home / Self Care | Payer: Medicaid Other | Attending: Emergency Medicine | Admitting: Emergency Medicine

## 2017-08-05 DIAGNOSIS — F17210 Nicotine dependence, cigarettes, uncomplicated: Secondary | ICD-10-CM | POA: Diagnosis not present

## 2017-08-05 DIAGNOSIS — I10 Essential (primary) hypertension: Secondary | ICD-10-CM | POA: Insufficient documentation

## 2017-08-05 DIAGNOSIS — R300 Dysuria: Secondary | ICD-10-CM | POA: Insufficient documentation

## 2017-08-05 DIAGNOSIS — Z79899 Other long term (current) drug therapy: Secondary | ICD-10-CM | POA: Diagnosis not present

## 2017-08-05 DIAGNOSIS — N12 Tubulo-interstitial nephritis, not specified as acute or chronic: Secondary | ICD-10-CM | POA: Diagnosis not present

## 2017-08-05 DIAGNOSIS — R1084 Generalized abdominal pain: Secondary | ICD-10-CM | POA: Diagnosis present

## 2017-08-05 LAB — URINALYSIS, ROUTINE W REFLEX MICROSCOPIC
BILIRUBIN URINE: NEGATIVE
Glucose, UA: NEGATIVE mg/dL
KETONES UR: NEGATIVE mg/dL
NITRITE: POSITIVE — AB
PH: 5 (ref 5.0–8.0)
Protein, ur: 30 mg/dL — AB
Specific Gravity, Urine: 1.019 (ref 1.005–1.030)

## 2017-08-05 LAB — COMPREHENSIVE METABOLIC PANEL
ALK PHOS: 70 U/L (ref 38–126)
ALT: 13 U/L — ABNORMAL LOW (ref 14–54)
AST: 20 U/L (ref 15–41)
Albumin: 4.1 g/dL (ref 3.5–5.0)
Anion gap: 13 (ref 5–15)
BILIRUBIN TOTAL: 0.2 mg/dL — AB (ref 0.3–1.2)
BUN: 11 mg/dL (ref 6–20)
CALCIUM: 9.3 mg/dL (ref 8.9–10.3)
CO2: 24 mmol/L (ref 22–32)
CREATININE: 0.77 mg/dL (ref 0.44–1.00)
Chloride: 102 mmol/L (ref 101–111)
GFR calc non Af Amer: >60 mL/min (ref >60)
Glucose, Bld: 92 mg/dL (ref 65–99)
Potassium: 3.6 mmol/L (ref 3.5–5.1)
Sodium: 139 mmol/L (ref 135–145)
Total Protein: 8 g/dL (ref 6.5–8.1)

## 2017-08-05 LAB — CBC
HCT: 46.9 % — ABNORMAL HIGH (ref 36.0–46.0)
Hemoglobin: 15.8 g/dL — ABNORMAL HIGH (ref 12.0–15.0)
MCH: 30.8 pg (ref 26.0–34.0)
MCHC: 33.7 g/dL (ref 30.0–36.0)
MCV: 91.4 fL (ref 78.0–100.0)
PLATELETS: 332 10*3/uL (ref 150–400)
RBC: 5.13 MIL/uL — AB (ref 3.87–5.11)
RDW: 14.6 % (ref 11.5–15.5)
WBC: 15.5 10*3/uL — ABNORMAL HIGH (ref 4.0–10.5)

## 2017-08-05 LAB — PREGNANCY, URINE: Preg Test, Ur: NEGATIVE

## 2017-08-05 LAB — LIPASE, BLOOD: Lipase: 42 U/L (ref 11–51)

## 2017-08-05 MED ORDER — CIPROFLOXACIN IN D5W 400 MG/200ML IV SOLN
400.0000 mg | Freq: Once | INTRAVENOUS | Status: AC
  Administered 2017-08-05: 400 mg via INTRAVENOUS
  Filled 2017-08-05: qty 200

## 2017-08-05 MED ORDER — TRAMADOL HCL 50 MG PO TABS: 50.0000 mg | ORAL_TABLET | Freq: Four times a day (QID) | ORAL | 0 refills | Status: DC | PRN

## 2017-08-05 MED ORDER — OXYCODONE-ACETAMINOPHEN 5-325 MG PO TABS
1.0000 | ORAL_TABLET | Freq: Once | ORAL | Status: AC
  Administered 2017-08-05: 1 via ORAL
  Filled 2017-08-05: qty 1

## 2017-08-05 MED ORDER — SODIUM CHLORIDE 0.9 % IV BOLUS (SEPSIS)
1000.0000 mL | Freq: Once | INTRAVENOUS | Status: AC
  Administered 2017-08-05: 1000 mL via INTRAVENOUS

## 2017-08-05 MED ORDER — CIPROFLOXACIN HCL 500 MG PO TABS: 500.0000 mg | ORAL_TABLET | Freq: Two times a day (BID) | ORAL | 0 refills | Status: DC

## 2017-08-05 MED ORDER — HYDROMORPHONE HCL 1 MG/ML IJ SOLN
0.5000 mg | Freq: Once | INTRAMUSCULAR | Status: AC
  Administered 2017-08-05: 0.5 mg via INTRAVENOUS
  Filled 2017-08-05: qty 1

## 2017-08-05 MED ORDER — ONDANSETRON 4 MG PO TBDP: ORAL_TABLET | ORAL | 0 refills | Status: DC

## 2017-08-05 MED ORDER — ONDANSETRON HCL 4 MG/2ML IJ SOLN
4.0000 mg | Freq: Once | INTRAMUSCULAR | Status: AC
  Administered 2017-08-05: 4 mg via INTRAVENOUS
  Filled 2017-08-05: qty 2

## 2017-08-05 NOTE — Discharge Instructions (Signed)
Drink plenty of fluids and follow up with your md or urologist this week.

## 2017-08-05 NOTE — ED Triage Notes (Signed)
Left flank pain with nausea and dysuria x1 hour.

## 2017-08-05 NOTE — ED Provider Notes (Signed)
[facility]-EM[facility]ENCY DEPT Provider Note   CSN: [id number] Arrival date & time: 08/05/17  1536     History   Chief Complaint Chief Complaint  Patient presents with  . Flank Pain  . Dysuria    HPI Isabella Buchanan is a 36 y.o. female.  patient complains of left-sided flank pain   The history is provided by the patient. No language interpreter was used.  Flank Pain  This is a new problem. The current episode started yesterday. The problem occurs constantly. The problem has not changed since onset.Pertinent negatives include no chest pain, no abdominal pain and no headaches. Nothing aggravates the symptoms. Nothing relieves the symptoms.  Dysuria   Associated symptoms include flank pain. Pertinent negatives include no frequency and no hematuria.    Past Medical History:  Diagnosis Date  . Anxiety   . Chronic back pain   . Chronic flank pain   . Chronic pain   . DOE (dyspnea on exertion)   . Fibromyalgia   . GERD (gastroesophageal reflux disease)   . Headache(784.0)    mygrains  . Hiatal hernia   . History of electroencephalogram 11/2013   normal EEG, "psychogenic nonepileptic spell" during EEG  . Hypertension   . IBS (irritable bowel syndrome)   . Interstitial cystitis   . Kidney stones   . Polycystic ovarian syndrome   . PONV (postoperative nausea and vomiting)   . Restless leg   . Seizures (HCC)   . Swelling     Patient Active Problem List   Diagnosis Date Noted  . Morbid obesity (HCC) 08/02/2016  . Hypertriglyceridemia without hypercholesterolemia 08/02/2016  . Tobacco abuse 08/02/2016  . Essential hypertriglyceridemia 06/30/2015  . Combined fat and carbohydrate induced hyperlipemia 06/30/2015  . Nicotine addiction 06/28/2015  . Cyclic vomiting syndrome 06/28/2015  . Avitaminosis D 06/28/2015  . Essential hypertension 05/04/2015  . Degeneration of intervertebral disc of lumbar region 02/05/2015  . Anxiety, generalized 02/05/2015  . Community acquired  pneumonia 07/17/2014  . CAP (community acquired pneumonia) 07/17/2014  . Sepsis (HCC) 07/17/2014  . Encephalopathy 07/17/2014  . Acute respiratory failure (HCC) 07/17/2014  . Seizure (HCC) 12/13/2013  . Seizures (HCC) 12/13/2013  . History of IBS 12/13/2013  . Fibromyalgia   . Chronic back pain   . Interstitial cystitis   . Polycystic ovarian syndrome     Past Surgical History:  Procedure Laterality Date  . CHOLECYSTECTOMY    . ESOPHAGOGASTRODUODENOSCOPY ENDOSCOPY    . TONSILLECTOMY    . TUBAL LIGATION    . TYMPANOPLASTY      OB History    Gravida Para Term Preterm AB Living   SAB TAB Ectopic Multiple Live Births                   Home Medications    Prior to Admission medications   Medication Sig Start Date End Date Taking? Authorizing Provider  diphenhydramine-acetaminophen (TYLENOL PM) 25-500 MG TABS Take 3 tablets by mouth at bedtime as needed (pain/sleep).    Yes [provider]  ibuprofen (ADVIL,MOTRIN) 200 MG tablet Take 200 mg by mouth every 6 (six) hours as needed for moderate pain.   Yes [provider]  pramipexole (MIRAPEX) 1 MG tablet Take 1 mg by mouth at bedtime.   Yes [provider]  ciprofloxacin (CIPRO) 500 MG tablet Take 1 tablet (500 mg total) by mouth 2 (two) times daily. One po bid x 7  days 08/05/17   Bethann Berkshire, MD  ondansetron (ZOFRAN ODT) 4 MG disintegrating tablet  ODT q4 hours prn nausea/vomit 08/05/17   Bethann Berkshire, MD  traMADol (ULTRAM) 50 MG tablet Take 1 tablet (50 mg total) by mouth every 6 (six) hours as needed. 08/05/17   Bethann Berkshire, MD    Family History Family History  Problem Relation Age of Onset  . Hypertension Father   . Diabetes Father   . Seizures Father   . Cancer Other   . Diabetes Other     Social History Social History  Substance Use Topics  . Smoking status: Current Every Day Smoker    Packs/day: 2.00    Years: 15.00    Types: Cigarettes  . Smokeless tobacco:  Never Used  . Alcohol use Yes     Comment: rarely     Allergies   Omnicef [cefdinir]; Amoxicillin; Celebrex [celecoxib]; Codeine; Penicillins; and Sulfa antibiotics   Review of Systems Review of Systems  Constitutional: Negative for appetite change and fatigue.  HENT: Negative for congestion, ear discharge and sinus pressure.   Eyes: Negative for discharge.  Respiratory: Negative for cough.   Cardiovascular: Negative for chest pain.  Gastrointestinal: Negative for abdominal pain and diarrhea.  Genitourinary: Positive for dysuria and flank pain. Negative for frequency and hematuria.  Musculoskeletal: Negative for back pain.  Skin: Negative for rash.  Neurological: Negative for seizures and headaches.  Psychiatric/Behavioral: Negative for hallucinations.     Physical Exam Updated Vital Signs BP (!) 122/99   Pulse 94   Temp 98.1 F (36.7 C) (Oral)   Resp 20   Ht  (1.676 m)   Wt 91.2 kg (201 lb)   LMP 07/17/2017   SpO2 99%   BMI 32.44 kg/m   Physical Exam  Constitutional: She is oriented to person, place, and time. She appears well-developed.  HENT:  Head: Normocephalic.  Eyes: Conjunctivae and EOM are normal. No scleral icterus.  Neck: Neck supple. No thyromegaly present.  Cardiovascular: Normal rate and regular rhythm.  Exam reveals no gallop and no friction rub.   No murmur heard. Pulmonary/Chest: No stridor. She has no wheezes. She has no rales. She exhibits no tenderness.  Abdominal: She exhibits no distension. There is no tenderness. There is no rebound.  Genitourinary:  Genitourinary Comments: Tender left flank  Musculoskeletal: Normal range of motion. She exhibits no edema.  Lymphadenopathy:    She has no cervical adenopathy.  Neurological: She is oriented to person, place, and time. She exhibits normal muscle tone. Coordination normal.  Skin: No rash noted. No erythema.  Psychiatric: She has a normal mood and affect. Her behavior is normal.      ED Treatments / Results  Labs (all labs ordered are listed, but only abnormal results are displayed) Labs Reviewed  URINALYSIS, ROUTINE W REFLEX MICROSCOPIC - Abnormal; Notable for the following:       Result Value   APPearance CLOUDY (*)    Hgb urine dipstick MODERATE (*)    Protein, ur 30 (*)    Nitrite POSITIVE (*)    Leukocytes, UA LARGE (*)    Bacteria, UA FEW (*)    Squamous Epithelial / LPF 6-30 (*)    All other components within normal limits  COMPREHENSIVE METABOLIC PANEL - Abnormal; Notable for the following:    ALT 13 (*)    Total Bilirubin 0.2 (*)    All other components within normal limits  CBC - Abnormal; Notable for the following:  WBC 15.5 (*)    RBC 5.13 (*)    Hemoglobin 15.8 (*)    HCT 46.9 (*)    All other components within normal limits  URINE CULTURE  PREGNANCY, URINE  LIPASE, BLOOD    EKG  EKG Interpretation None       Radiology No results found.  Procedures Procedures (including critical care time)  Medications Ordered in ED Medications  ciprofloxacin (CIPRO) IVPB 400 mg (400 mg Intravenous New Bag/Given 08/05/17 1848)  sodium chloride 0.9 % bolus 1,000 mL (0 mLs Intravenous Stopped 08/05/17 1837)  ondansetron (ZOFRAN) injection 4 mg (4 mg Intravenous Given 08/05/17 1720)  HYDROmorphone (DILAUDID) injection 0.5 mg (0.5 mg Intravenous Given 08/05/17 1720)     Initial Impression / Assessment and Plan / ED Course  I have reviewed the triage vital signs and the nursing notes.  Pertinent labs & imaging results that were available during my care of the patient were reviewed by me and considered in my medical decision making (see chart for details).     patient with urinary tract infection she will be placed on Cipro and Ultram and Zofran and will follow-up with her doctor this week  Final Clinical Impressions(s) / ED Diagnoses   Final diagnoses:  Pyelonephritis    New Prescriptions New Prescriptions   CIPROFLOXACIN (CIPRO)  500 MG TABLET    Take 1 tablet (500 mg total) by mouth 2 (two) times daily. One po bid x 7 days   ONDANSETRON (ZOFRAN ODT) 4 MG DISINTEGRATING TABLET     ODT q4 hours prn nausea/vomit   TRAMADOL (ULTRAM) 50 MG TABLET    Take 1 tablet (50 mg total) by mouth every 6 (six) hours as needed.     Bethann Berkshire, MD 08/05/17 [phone]

## 2017-08-06 ENCOUNTER — Emergency Department (HOSPITAL_COMMUNITY): Payer: Medicaid Other | Admitting: Anesthesiology

## 2017-08-06 ENCOUNTER — Encounter (HOSPITAL_COMMUNITY): Payer: Self-pay | Admitting: *Deleted

## 2017-08-06 ENCOUNTER — Emergency Department (HOSPITAL_COMMUNITY): Payer: Medicaid Other

## 2017-08-06 ENCOUNTER — Encounter (HOSPITAL_COMMUNITY)
Admission: EM | Disposition: A | Discharge status: Home / Self Care | Payer: Self-pay | Source: Home / Self Care | Attending: Emergency Medicine

## 2017-08-06 ENCOUNTER — Observation Stay (HOSPITAL_COMMUNITY)
Admission: EM | Admit: 2017-08-06 | Discharge: 2017-08-07 | Disposition: A | Discharge status: Home / Self Care | Payer: Medicaid Other | Attending: Urology | Admitting: Urology

## 2017-08-06 DIAGNOSIS — E282 Polycystic ovarian syndrome: Secondary | ICD-10-CM | POA: Diagnosis not present

## 2017-08-06 DIAGNOSIS — N200 Calculus of kidney: Secondary | ICD-10-CM

## 2017-08-06 DIAGNOSIS — M797 Fibromyalgia: Secondary | ICD-10-CM | POA: Diagnosis not present

## 2017-08-06 DIAGNOSIS — I10 Essential (primary) hypertension: Secondary | ICD-10-CM | POA: Insufficient documentation

## 2017-08-06 DIAGNOSIS — Z9049 Acquired absence of other specified parts of digestive tract: Secondary | ICD-10-CM | POA: Insufficient documentation

## 2017-08-06 DIAGNOSIS — Z880 Allergy status to penicillin: Secondary | ICD-10-CM | POA: Insufficient documentation

## 2017-08-06 DIAGNOSIS — D72829 Elevated white blood cell count, unspecified: Secondary | ICD-10-CM

## 2017-08-06 DIAGNOSIS — N390 Urinary tract infection, site not specified: Secondary | ICD-10-CM

## 2017-08-06 DIAGNOSIS — K219 Gastro-esophageal reflux disease without esophagitis: Secondary | ICD-10-CM | POA: Insufficient documentation

## 2017-08-06 DIAGNOSIS — N201 Calculus of ureter: Secondary | ICD-10-CM | POA: Diagnosis present

## 2017-08-06 DIAGNOSIS — G2581 Restless legs syndrome: Secondary | ICD-10-CM | POA: Diagnosis not present

## 2017-08-06 DIAGNOSIS — K589 Irritable bowel syndrome without diarrhea: Secondary | ICD-10-CM | POA: Diagnosis not present

## 2017-08-06 DIAGNOSIS — R1032 Left lower quadrant pain: Secondary | ICD-10-CM

## 2017-08-06 DIAGNOSIS — Z87442 Personal history of urinary calculi: Secondary | ICD-10-CM | POA: Diagnosis not present

## 2017-08-06 DIAGNOSIS — N132 Hydronephrosis with renal and ureteral calculous obstruction: Principal | ICD-10-CM | POA: Insufficient documentation

## 2017-08-06 DIAGNOSIS — F17210 Nicotine dependence, cigarettes, uncomplicated: Secondary | ICD-10-CM | POA: Diagnosis not present

## 2017-08-06 HISTORY — PX: CYSTOSCOPY W/ URETERAL STENT PLACEMENT: SHX1429

## 2017-08-06 LAB — URINALYSIS, ROUTINE W REFLEX MICROSCOPIC
Bilirubin Urine: NEGATIVE
Glucose, UA: NEGATIVE mg/dL
Ketones, ur: NEGATIVE mg/dL
Nitrite: NEGATIVE
Protein, ur: NEGATIVE mg/dL
SPECIFIC GRAVITY, URINE: 1.015 (ref 1.005–1.030)
pH: 5 (ref 5.0–8.0)

## 2017-08-06 SURGERY — CYSTOSCOPY, WITH RETROGRADE PYELOGRAM AND URETERAL STENT INSERTION
Anesthesia: General | Site: Ureter | Laterality: Left

## 2017-08-06 MED ORDER — DEXAMETHASONE SODIUM PHOSPHATE 4 MG/ML IJ SOLN
INTRAMUSCULAR | Status: AC
  Filled 2017-08-06: qty 1

## 2017-08-06 MED ORDER — ACETAMINOPHEN 325 MG PO TABS: 650.0000 mg | ORAL_TABLET | ORAL | Status: DC | PRN

## 2017-08-06 MED ORDER — MORPHINE SULFATE (PF) 4 MG/ML IV SOLN
4.0000 mg | Freq: Once | INTRAVENOUS | Status: AC
  Administered 2017-08-06: 4 mg via INTRAVENOUS
  Filled 2017-08-06: qty 1

## 2017-08-06 MED ORDER — KETOROLAC TROMETHAMINE 30 MG/ML IJ SOLN
30.0000 mg | Freq: Once | INTRAMUSCULAR | Status: AC
  Administered 2017-08-06: 30 mg via INTRAVENOUS
  Filled 2017-08-06: qty 1

## 2017-08-06 MED ORDER — ENOXAPARIN SODIUM 40 MG/0.4ML ~~LOC~~ SOLN: 40.0000 mg | SUBCUTANEOUS | Status: DC

## 2017-08-06 MED ORDER — LABETALOL HCL 5 MG/ML IV SOLN
INTRAVENOUS | Status: AC
  Filled 2017-08-06: qty 4

## 2017-08-06 MED ORDER — ONDANSETRON HCL 4 MG/2ML IJ SOLN
4.0000 mg | Freq: Once | INTRAMUSCULAR | Status: AC
  Administered 2017-08-06: 4 mg via INTRAVENOUS

## 2017-08-06 MED ORDER — ZOLPIDEM TARTRATE 5 MG PO TABS: 5.0000 mg | ORAL_TABLET | Freq: Every evening | ORAL | Status: DC | PRN

## 2017-08-06 MED ORDER — SUCCINYLCHOLINE CHLORIDE 20 MG/ML IJ SOLN
INTRAMUSCULAR | Status: DC | PRN
  Administered 2017-08-06: 175 mg via INTRAVENOUS

## 2017-08-06 MED ORDER — EPHEDRINE SULFATE 50 MG/ML IJ SOLN
INTRAMUSCULAR | Status: AC
  Filled 2017-08-06: qty 1

## 2017-08-06 MED ORDER — FENTANYL CITRATE (PF) 100 MCG/2ML IJ SOLN
INTRAMUSCULAR | Status: DC | PRN
  Administered 2017-08-06 (×2): 50 ug via INTRAVENOUS

## 2017-08-06 MED ORDER — GLYCOPYRROLATE 0.2 MG/ML IJ SOLN
0.2000 mg | Freq: Once | INTRAMUSCULAR | Status: AC
  Administered 2017-08-06: 0.2 mg via INTRAVENOUS

## 2017-08-06 MED ORDER — FENTANYL CITRATE (PF) 100 MCG/2ML IJ SOLN: 25.0000 ug | INTRAMUSCULAR | Status: DC | PRN

## 2017-08-06 MED ORDER — LABETALOL HCL 5 MG/ML IV SOLN
INTRAVENOUS | Status: DC | PRN
  Administered 2017-08-06 (×2): 10 mg via INTRAVENOUS

## 2017-08-06 MED ORDER — GENTAMICIN IN SALINE 1.6-0.9 MG/ML-% IV SOLN
INTRAVENOUS | Status: AC
  Filled 2017-08-06: qty 100

## 2017-08-06 MED ORDER — MIDAZOLAM HCL 2 MG/2ML IJ SOLN
1.0000 mg | INTRAMUSCULAR | Status: DC
  Administered 2017-08-06: 2 mg via INTRAVENOUS

## 2017-08-06 MED ORDER — SENNA 8.6 MG PO TABS
1.0000 | ORAL_TABLET | Freq: Two times a day (BID) | ORAL | Status: DC
  Administered 2017-08-06: 8.6 mg via ORAL
  Filled 2017-08-06: qty 1

## 2017-08-06 MED ORDER — MORPHINE SULFATE (PF) 4 MG/ML IV SOLN
4.0000 mg | INTRAVENOUS | Status: DC | PRN
Start: 2017-08-06 — End: 2017-08-06
  Administered 2017-08-06: 4 mg via INTRAVENOUS
  Filled 2017-08-06: qty 1

## 2017-08-06 MED ORDER — LIDOCAINE HCL (PF) 1 % IJ SOLN
INTRAMUSCULAR | Status: AC
  Filled 2017-08-06: qty 5

## 2017-08-06 MED ORDER — SODIUM CHLORIDE 0.9 % IJ SOLN
INTRAMUSCULAR | Status: AC
  Filled 2017-08-06: qty 10

## 2017-08-06 MED ORDER — MIDAZOLAM HCL 2 MG/2ML IJ SOLN
INTRAMUSCULAR | Status: AC
  Filled 2017-08-06: qty 2

## 2017-08-06 MED ORDER — MIDAZOLAM HCL 5 MG/5ML IJ SOLN
INTRAMUSCULAR | Status: DC | PRN
  Administered 2017-08-06: 2 mg via INTRAVENOUS

## 2017-08-06 MED ORDER — LACTATED RINGERS IV SOLN
INTRAVENOUS | Status: DC
  Administered 2017-08-06: 07:00:00 via INTRAVENOUS

## 2017-08-06 MED ORDER — LIDOCAINE HCL 1 % IJ SOLN
INTRAMUSCULAR | Status: DC | PRN
  Administered 2017-08-06: 30 mg via INTRADERMAL

## 2017-08-06 MED ORDER — PRAMIPEXOLE DIHYDROCHLORIDE 1 MG PO TABS
1.0000 mg | ORAL_TABLET | Freq: Every day | ORAL | Status: DC
  Administered 2017-08-06: 1 mg via ORAL
  Filled 2017-08-06: qty 1

## 2017-08-06 MED ORDER — GENTAMICIN SULFATE 40 MG/ML IJ SOLN
160.0000 mg | Freq: Once | INTRAVENOUS | Status: DC
  Filled 2017-08-06: qty 4

## 2017-08-06 MED ORDER — OXYBUTYNIN CHLORIDE 5 MG PO TABS: 5.0000 mg | ORAL_TABLET | Freq: Three times a day (TID) | ORAL | Status: DC | PRN

## 2017-08-06 MED ORDER — PROPOFOL 10 MG/ML IV BOLUS
INTRAVENOUS | Status: AC
  Filled 2017-08-06: qty 40

## 2017-08-06 MED ORDER — CIPROFLOXACIN IN D5W 400 MG/200ML IV SOLN
400.0000 mg | Freq: Two times a day (BID) | INTRAVENOUS | Status: DC
  Administered 2017-08-06 (×2): 400 mg via INTRAVENOUS
  Filled 2017-08-06 (×3): qty 200

## 2017-08-06 MED ORDER — ENOXAPARIN SODIUM 40 MG/0.4ML ~~LOC~~ SOLN
40.0000 mg | SUBCUTANEOUS | Status: DC
  Filled 2017-08-06: qty 0.4

## 2017-08-06 MED ORDER — SUCCINYLCHOLINE CHLORIDE 20 MG/ML IJ SOLN
INTRAMUSCULAR | Status: AC
  Filled 2017-08-06: qty 1

## 2017-08-06 MED ORDER — TRAMADOL HCL 50 MG PO TABS
50.0000 mg | ORAL_TABLET | Freq: Four times a day (QID) | ORAL | Status: DC | PRN
  Administered 2017-08-07: 50 mg via ORAL
  Filled 2017-08-06 (×2): qty 1

## 2017-08-06 MED ORDER — PROPOFOL 10 MG/ML IV BOLUS
INTRAVENOUS | Status: DC | PRN
  Administered 2017-08-06: 160 mg via INTRAVENOUS

## 2017-08-06 MED ORDER — ROCURONIUM BROMIDE 100 MG/10ML IV SOLN
INTRAVENOUS | Status: DC | PRN
  Administered 2017-08-06: 5 mg via INTRAVENOUS

## 2017-08-06 MED ORDER — GLYCOPYRROLATE 0.2 MG/ML IJ SOLN
INTRAMUSCULAR | Status: AC
  Filled 2017-08-06: qty 1

## 2017-08-06 MED ORDER — FENTANYL CITRATE (PF) 100 MCG/2ML IJ SOLN
INTRAMUSCULAR | Status: AC
  Filled 2017-08-06: qty 4

## 2017-08-06 MED ORDER — DIATRIZOATE MEGLUMINE 30 % UR SOLN
URETHRAL | Status: DC | PRN
  Administered 2017-08-06: 10 mL via URETHRAL

## 2017-08-06 MED ORDER — SODIUM CHLORIDE 0.9 % IR SOLN
Status: DC | PRN
  Administered 2017-08-06: 1

## 2017-08-06 MED ORDER — ROCURONIUM BROMIDE 50 MG/5ML IV SOLN
INTRAVENOUS | Status: AC
  Filled 2017-08-06: qty 1

## 2017-08-06 MED ORDER — ONDANSETRON HCL 4 MG/2ML IJ SOLN
4.0000 mg | INTRAMUSCULAR | Status: DC | PRN
  Administered 2017-08-06: 4 mg via INTRAVENOUS
  Filled 2017-08-06: qty 2

## 2017-08-06 MED ORDER — DEXAMETHASONE SODIUM PHOSPHATE 4 MG/ML IJ SOLN
4.0000 mg | Freq: Once | INTRAMUSCULAR | Status: AC
  Administered 2017-08-06: 4 mg via INTRAVENOUS

## 2017-08-06 MED ORDER — ONDANSETRON HCL 4 MG/2ML IJ SOLN
4.0000 mg | Freq: Once | INTRAMUSCULAR | Status: AC
  Administered 2017-08-06: 4 mg via INTRAVENOUS
  Filled 2017-08-06: qty 2

## 2017-08-06 MED ORDER — ONDANSETRON HCL 4 MG/2ML IJ SOLN
INTRAMUSCULAR | Status: AC
  Filled 2017-08-06: qty 2

## 2017-08-06 MED ORDER — SODIUM CHLORIDE 0.45 % IV SOLN
INTRAVENOUS | Status: DC
  Administered 2017-08-06: 11:00:00 via INTRAVENOUS

## 2017-08-06 SURGICAL SUPPLY — 20 items
BAG DRAIN URO TABLE W/ADPT NS (DRAPE) ×2 IMPLANT
BAG HAMPER (MISCELLANEOUS) ×2 IMPLANT
CATH INTERMIT  6FR 70CM (CATHETERS) ×2 IMPLANT
CLOTH BEACON ORANGE TIMEOUT ST (SAFETY) ×2 IMPLANT
DECANTER SPIKE VIAL GLASS SM (MISCELLANEOUS) ×2 IMPLANT
GLOVE BIOGEL M 8.0 STRL (GLOVE) ×2 IMPLANT
GLOVE BIOGEL PI IND STRL 6.5 (GLOVE) ×2 IMPLANT
GLOVE BIOGEL PI INDICATOR 6.5 (GLOVE) ×2
GOWN STRL REIN XL XLG (GOWN DISPOSABLE) ×2 IMPLANT
GUIDEWIRE ANG ZIPWIRE 038X150 (WIRE) ×2 IMPLANT
GUIDEWIRE STR DUAL SENSOR (WIRE) ×2 IMPLANT
IV NS IRRIG 3000ML ARTHROMATIC (IV SOLUTION) ×2 IMPLANT
KIT ROOM TURNOVER AP CYSTO (KITS) ×2 IMPLANT
MANIFOLD NEPTUNE II (INSTRUMENTS) ×2 IMPLANT
PACK CYSTO (CUSTOM PROCEDURE TRAY) ×2 IMPLANT
PAD ARMBOARD 7.5X6 YLW CONV (MISCELLANEOUS) ×2 IMPLANT
STENT CONTOUR 6FRX24X.038 (STENTS) ×2 IMPLANT
TOWEL OR 17X26 4PK STRL BLUE (TOWEL DISPOSABLE) ×2 IMPLANT
WATER STERILE IRR 1000ML POUR (IV SOLUTION) ×2 IMPLANT
WATER STERILE IRR 3000ML UROMA (IV SOLUTION) IMPLANT

## 2017-08-06 NOTE — ED Triage Notes (Signed)
Pt states she was seen here earlier tonight and diagnosed with a kidney infection; pt states the pain woke her up from sleep tonight

## 2017-08-06 NOTE — OR Nursing (Signed)
Fiance" RAy took cell and patient belongings

## 2017-08-06 NOTE — Anesthesia Preprocedure Evaluation (Signed)
Anesthesia Evaluation  Patient identified by MRN, date of birth, ID band Patient awake    Reviewed: Allergy & Precautions, NPO status , Patient's Chart, lab work & pertinent test results  History of Anesthesia Complications (+) PONV and history of anesthetic complications  Airway Mallampati: II  TM Distance: >3 FB Neck ROM: Full    Dental  (+) Edentulous Upper, Edentulous Lower   Pulmonary pneumonia (CAP), resolved, Current Smoker,    breath sounds clear to auscultation       Cardiovascular hypertension, Pt. on medications + DOE   Rhythm:Regular Rate:Normal     Neuro/Psych  Headaches, Seizures -,  PSYCHIATRIC DISORDERS Anxiety    GI/Hepatic hiatal hernia, GERD  Medicated,  Endo/Other  Morbid obesityPolycystic ovarian syndrome  Renal/GU Renal disease     Musculoskeletal  (+) Fibromyalgia -  Abdominal   Peds  Hematology   Anesthesia Other Findings   Reproductive/Obstetrics                             Anesthesia Physical Anesthesia Plan  ASA: III and emergent  Anesthesia Plan: General   Post-op Pain Management:    Induction: Intravenous, Rapid sequence and Cricoid pressure planned  PONV Risk Score and Plan:   Airway Management Planned: Oral ETT  Additional Equipment:   Intra-op Plan:   Post-operative Plan: Extubation in OR  Informed Consent: I have reviewed the patients History and Physical, chart, labs and discussed the procedure including the risks, benefits and alternatives for the proposed anesthesia with the patient or authorized representative who has indicated his/her understanding and acceptance.     Plan Discussed with:   Anesthesia Plan Comments:         Anesthesia Quick Evaluation

## 2017-08-06 NOTE — Op Note (Signed)
Preoperative diagnosis: Left ureteral calculi, probable UTI.  Postop diagnosis: Same, para [place] procedure: Cystoscopy, left retrograde ureteropyelogram.  A fluoroscopic interpretation (fluoroscopy use under one hour) placement of 6 French by 24 centimeter contoured double-J stent without tether.  Surgeon: Retta Diones  Anesthesia: Gen. Endotracheal.  Complications: None.  Estimated blood loss: None.  Drains: Above-mentioned stent.  Indications: 36 year old female with history of urolithiasis, presenting twice within the past 24 hours with pyuria and left flank pain.  On her second presentation to the emergency room, CT scan was obtained revealing 2.  Left distal ureteral stones.  She did have mild leukocytosis and catheterized urine revealed infected urine.  Despite not having a fever, it was recommended that the patient undergo urgent stent placement, antibiotic management to be followed down the road by eventual stone management including ureteroscopy and extraction.  She understands the procedure as well as risks and complications.  She desires to proceed.  Findings: Bladder appeared normal, with normal urothelium.  Ureteral orifices were normal.  Retrograde ureteropyelogram performed with Omnipaque revealed left hydroureteronephrosis with 2 filling defects in the distal ureter consistent with her above-mentioned stones.  No other abnormalities were noted.  Description of procedure: The patient was properly identified and marked in the holding area.  She received preoperative gentamicin.  She was then taken to the operating room where general anesthetic was administered.  She was placed in a dorsolithotomy position.  Genitalia and perineum were prepped and draped.  Proper timeout was performed.  A 21 French panendoscope was advanced into her bladder with circumferential inspection performed.  This revealed normal urothelium.  The left ureteral orifice was cannulated with a 5 [place] open-ended  catheter.  Using Omnipaque, the above-mentioned retrograde ureteropyelogram was noted with the above-mentioned findings.  I then navigated a 0.038 inch sensor-tip guidewire up into the open-ended catheter, easily passing this into the upper pole calyceal system where curl was seen.  The open-ended catheter was removed.  I then passed a 24 centimeter by 6 [place] contour double-J stent with the tether removed into the left ureter.  Once adequate positioning was seen, the guidewire was removed and the stent deployed it within the ureter.  A good distal curl was seen in the bladder visually.  At this point, the bladder was drained.  The patient was awakened and taken to the PACU in stable condition.  She tolerated the procedure well.

## 2017-08-06 NOTE — Anesthesia Postprocedure Evaluation (Signed)
Anesthesia Post Note  Patient: Isabella Buchanan  Procedure(s) Performed: Procedure(s) (LRB): CYSTOSCOPY WITH RETROGRADE PYELOGRAM/URETERAL STENT PLACEMENT (Left)  Patient location during evaluation: PACU Anesthesia Type: General Level of consciousness: awake and alert and patient cooperative Pain management: pain level controlled Vital Signs Assessment: post-procedure vital signs reviewed and stable Respiratory status: spontaneous breathing, nonlabored ventilation and respiratory function stable Cardiovascular status: blood pressure returned to baseline Postop Assessment: no apparent nausea or vomiting Anesthetic complications: no     Last Vitals:  Vitals:   08/06/17 0740 08/06/17 0826  BP: (!) 131/102 123/85  Pulse:  89  Resp: 18 15  Temp:  37.3 C  SpO2:  99%    Last Pain:  Vitals:   08/06/17 0826  TempSrc:   PainSc: Asleep                 Thelma Viana J

## 2017-08-06 NOTE — Transfer of Care (Signed)
Immediate Anesthesia Transfer of Care Note  Patient: Isabella Buchanan  Procedure(s) Performed: Procedure(s): CYSTOSCOPY WITH RETROGRADE PYELOGRAM/URETERAL STENT PLACEMENT (Left)  Patient Location: PACU  Anesthesia Type:General  Level of Consciousness: awake and patient cooperative  Airway & Oxygen Therapy: Patient Spontanous Breathing and Patient connected to face mask oxygen  Post-op Assessment: Report given to RN, Post -op Vital signs reviewed and stable and Patient moving all extremities  Post vital signs: Reviewed and stable  Last Vitals:  Vitals:   08/06/17 0735 08/06/17 0740  BP: (!) 144/87 (!) 131/102  Pulse:    Resp: (!) 34 18  Temp:    SpO2: 96%     Last Pain:  Vitals:   08/06/17 0718  TempSrc: Oral  PainSc: 2       Patients Stated Pain Goal: 4 (08/06/17 0718)  Complications: No apparent anesthesia complications

## 2017-08-06 NOTE — H&P (Signed)
H&P  Chief Complaint: Left kidney stone  History of Present Illness: Isabella Buchanan is a 36 y.o. year old female presenting to the ER for a 2nd time in 24 hrs with pain from a kidney stone. Yesterday she presented w/ infected urine and was sent home w/ abx. She presented again early this am w/ persistent pain and was found on CT to have 2 4 mm left dital stones w/ persistently infected urine. No f/c but she has had elevated wbc count. She is admitted for cysto/stent placement as well as stent placement.  Past Medical History:  Diagnosis Date  . Anxiety   . Chronic back pain   . Chronic flank pain   . Chronic pain   . DOE (dyspnea on exertion)   . Fibromyalgia   . GERD (gastroesophageal reflux disease)   . Headache(784.0)    mygrains  . Hiatal hernia   . History of electroencephalogram 11/2013   normal EEG, "psychogenic nonepileptic spell" during EEG  . Hypertension   . IBS (irritable bowel syndrome)   . Interstitial cystitis   . Kidney stones   . Polycystic ovarian syndrome   . PONV (postoperative nausea and vomiting)   . Restless leg   . Seizures (HCC)   . Swelling     Past Surgical History:  Procedure Laterality Date  . CHOLECYSTECTOMY    . ESOPHAGOGASTRODUODENOSCOPY ENDOSCOPY    . TONSILLECTOMY    . TUBAL LIGATION    . TYMPANOPLASTY      Home Medications:   (Not in a hospital admission)  Allergies:  Allergies  Allergen Reactions  . Omnicef [Cefdinir] Anaphylaxis    Swelling of tongue  . Amoxicillin Nausea And Vomiting and Rash  . Celebrex [Celecoxib] Nausea And Vomiting  . Codeine Nausea Only    Stomach aches  . Penicillins Nausea And Vomiting and Rash  . Sulfa Antibiotics Nausea And Vomiting    Family History  Problem Relation Age of Onset  . Hypertension Father   . Diabetes Father   . Seizures Father   . Cancer Other   . Diabetes Other     Social History:  reports that she has been smoking Cigarettes.  She has a 30.00 pack-year smoking history.  She has never used smokeless tobacco. She reports that she drinks alcohol. She reports that she does not use drugs.  ROS: A complete review of systems was performed.  All systems are negative except for pertinent findings as noted.  Physical Exam:  Vital signs in last 24 hours: Temp:  [98.1 F (36.7 C)-98.2 F (36.8 C)] 98.1 F (36.7 C) (09/17 0718) Pulse Rate:  [80-103] 85 (09/17 0718) Resp:  [18-20] 19 (09/17 0718) BP: (122-176)/(87-111) 149/97 (09/17 0718) SpO2:  [95 %-100 %] 96 % (09/17 0718) Weight:  [89.8 kg (198 lb)-91.2 kg (201 lb)] 91.2 kg (201 lb) (09/17 0258) General:  Alert and oriented, No acute distress HEENT: Normocephalic, atraumatic Neck: No JVD or lymphadenopathy Cardiovascular: Regular rate and rhythm Lungs: Clear bilaterally Abdomen: Soft, nontender, nondistended, no abdominal masses Back: No CVA tenderness Extremities: No edema Neurologic: Grossly intact  Laboratory Data:  Results for orders placed or performed during the hospital encounter of 08/06/17 (from the past 24 hour(s))  Urinalysis, Routine w reflex microscopic     Status: Abnormal   Collection Time: 08/06/17  5:36 AM  Result Value Ref Range   Color, Urine YELLOW YELLOW   APPearance HAZY (A) CLEAR   Specific Gravity, Urine 1.015 1.005 - 1.030  pH 5.0 5.0 - 8.0   Glucose, UA NEGATIVE NEGATIVE mg/dL   Hgb urine dipstick SMALL (A) NEGATIVE   Bilirubin Urine NEGATIVE NEGATIVE   Ketones, ur NEGATIVE NEGATIVE mg/dL   Protein, ur NEGATIVE NEGATIVE mg/dL   Nitrite NEGATIVE NEGATIVE   Leukocytes, UA MODERATE (A) NEGATIVE   RBC / HPF 0-5 0 - 5 RBC/hpf   WBC, UA TOO NUMEROUS TO COUNT 0 - 5 WBC/hpf   Bacteria, UA RARE (A) NONE SEEN   Squamous Epithelial / LPF 0-5 (A) NONE SEEN   WBC Clumps PRESENT    Hyaline Casts, UA PRESENT    No results found for this or any previous visit (from the past 240 hour(s)). Creatinine:  Recent Labs  08/05/17 1630  CREATININE 0.77    Radiologic Imaging: Ct  Renal Stone Study  Result Date: 08/06/2017 CLINICAL DATA:  Patient was seen here earlier tonight and diagnosed with a kidney infection. Now sharp constant left-sided flank pain radiating to the groin woke her up at night. EXAM: CT ABDOMEN AND PELVIS WITHOUT CONTRAST TECHNIQUE: Multidetector CT imaging of the abdomen and pelvis was performed following the standard protocol without IV contrast. COMPARISON:  09/20/2014 FINDINGS: Lower chest: Lung bases are clear. Hepatobiliary: No focal liver abnormality is seen. Status post cholecystectomy. No biliary dilatation. Pancreas: Unremarkable. No pancreatic ductal dilatation or surrounding inflammatory changes. Spleen: Normal in size without focal abnormality. Adrenals/Urinary Tract: No adrenal gland nodules. Multiple bilateral Kayla seal stones are demonstrated within both kidneys. Largest stone is on the left measuring 6 mm in diameter and located in the left lower pole. There is left-sided hydronephrosis and hydroureter extending down to the lower ureter at the level of the acetabulum were there are two 4 mm stones. Stranding around the left ureter and kidney. No ureterectasis on the right. No bladder wall thickening or bladder stones. Stomach/Bowel: Stomach, small bowel, and colon are mostly decompressed. No inflammatory changes. Appendix is normal. Vascular/Lymphatic: No significant vascular findings are present. No enlarged abdominal or pelvic lymph nodes. Reproductive: Uterus and bilateral adnexa are unremarkable. Other: No abdominal wall hernia or abnormality. No abdominopelvic ascites. Musculoskeletal: No acute or significant osseous findings. IMPRESSION: 1. Two 4 mm stones in the distal left ureter above the ureterovesical junction with moderate proximal obstruction. 2. Multiple bilateral nonobstructing intrarenal stones. Electronically Signed   By: Burman Nieves M.D.   On: 08/06/2017 05:16    Impression/Assessment:  Infected left distal ureteral  stones  Plan:  Cysto, left RGP, left J2 stent placement, admission for abx  Chelsea Aus 08/06/2017, 7:27 AM  Bertram Millard. Waino Mounsey MD

## 2017-08-06 NOTE — ED Provider Notes (Signed)
[facility]-EMERGENCY DEPT Provider Note   CSN: [id number] Arrival date & time: 08/06/17  0253     History   Chief Complaint Chief Complaint  Patient presents with  . Flank Pain    HPI Isabella Buchanan is a 35 y.o. female.  HPI  This is a 36 year old female with a history of fibromyalgia, kidney stones, hypertension, IBS, interstitial cystitis who presents with left flank pain. She was seen and evaluated yesterday evening and diagnosed with pyelonephritis. She was discharged home with antibiotics, tramadol, and Zofran. She denies any fevers. She states her pain was well controlled prior to going to bed. However, she woke up at 2 AM with sharp pain in the left flank. She reports that it radiates downward. Current pain is rated at 10 out of 10. It feels similar to her prior kidney stones. Denies any dysuria. Reports nausea and vomiting. No diarrhea.  Past Medical History:  Diagnosis Date  . Anxiety   . Chronic back pain   . Chronic flank pain   . Chronic pain   . DOE (dyspnea on exertion)   . Fibromyalgia   . GERD (gastroesophageal reflux disease)   . Headache(784.0)    mygrains  . Hiatal hernia   . History of electroencephalogram 11/2013   normal EEG, "psychogenic nonepileptic spell" during EEG  . Hypertension   . IBS (irritable bowel syndrome)   . Interstitial cystitis   . Kidney stones   . Polycystic ovarian syndrome   . PONV (postoperative nausea and vomiting)   . Restless leg   . Seizures (HCC)   . Swelling     Patient Active Problem List   Diagnosis Date Noted  . Morbid obesity (HCC) 08/02/2016  . Hypertriglyceridemia without hypercholesterolemia 08/02/2016  . Tobacco abuse 08/02/2016  . Essential hypertriglyceridemia 06/30/2015  . Combined fat and carbohydrate induced hyperlipemia 06/30/2015  . Nicotine addiction 06/28/2015  . Cyclic vomiting syndrome 06/28/2015  . Avitaminosis D 06/28/2015  . Essential hypertension 05/04/2015  . Degeneration of intervertebral  disc of lumbar region 02/05/2015  . Anxiety, generalized 02/05/2015  . Community acquired pneumonia 07/17/2014  . CAP (community acquired pneumonia) 07/17/2014  . Sepsis (HCC) 07/17/2014  . Encephalopathy 07/17/2014  . Acute respiratory failure (HCC) 07/17/2014  . Seizure (HCC) 12/13/2013  . Seizures (HCC) 12/13/2013  . History of IBS 12/13/2013  . Fibromyalgia   . Chronic back pain   . Interstitial cystitis   . Polycystic ovarian syndrome     Past Surgical History:  Procedure Laterality Date  . CHOLECYSTECTOMY    . ESOPHAGOGASTRODUODENOSCOPY ENDOSCOPY    . TONSILLECTOMY    . TUBAL LIGATION    . TYMPANOPLASTY      OB History    Gravida Para Term Preterm AB Living   SAB TAB Ectopic Multiple Live Births                   Home Medications    Prior to Admission medications   Medication Sig Start Date End Date Taking? Authorizing Provider  ciprofloxacin (CIPRO) 500 MG tablet Take 1 tablet (500 mg total) by mouth 2 (two) times daily. One po bid x 7 days 08/05/17   Bethann Berkshire, MD  diphenhydramine-acetaminophen (TYLENOL PM) 25-500 MG TABS Take 3 tablets by mouth at bedtime as needed (pain/sleep).     [provider]  ibuprofen (ADVIL,MOTRIN) 200 MG tablet Take 200 mg by mouth every 6 (six) hours as needed for  moderate pain.    [provider]  ondansetron (ZOFRAN ODT) 4 MG disintegrating tablet  ODT q4 hours prn nausea/vomit 08/05/17   Bethann Berkshire, MD  pramipexole (MIRAPEX) 1 MG tablet Take 1 mg by mouth at bedtime.    [provider]  traMADol (ULTRAM) 50 MG tablet Take 1 tablet (50 mg total) by mouth every 6 (six) hours as needed. 08/05/17   Bethann Berkshire, MD    Family History Family History  Problem Relation Age of Onset  . Hypertension Father   . Diabetes Father   . Seizures Father   . Cancer Other   . Diabetes Other     Social History Social History  Substance Use Topics  . Smoking status: Current Every Day  Smoker    Packs/day: 2.00    Years: 15.00    Types: Cigarettes  . Smokeless tobacco: Never Used  . Alcohol use Yes     Comment: rarely     Allergies   Omnicef [cefdinir]; Amoxicillin; Celebrex [celecoxib]; Codeine; Penicillins; and Sulfa antibiotics   Review of Systems Review of Systems  Constitutional: Negative for fever.  Respiratory: Negative for shortness of breath.   Cardiovascular: Negative for chest pain.  Gastrointestinal: Positive for nausea and vomiting. Negative for abdominal pain and diarrhea.  Genitourinary: Positive for flank pain. Negative for dysuria and hematuria.  All other systems reviewed and are negative.    Physical Exam Updated Vital Signs BP 126/87   Pulse 98   Temp 98.2 F (36.8 C) (Oral)   Resp 18   Ht  (1.676 m)   Wt 91.2 kg (201 lb)   LMP 07/17/2017   SpO2 97%   BMI 32.44 kg/m   Physical Exam  Constitutional: She is oriented to person, place, and time. She appears well-developed and well-nourished. No distress.  obese  HENT:  Head: Normocephalic and atraumatic.  Cardiovascular: Normal rate, regular rhythm and normal heart sounds.   Pulmonary/Chest: Effort normal and breath sounds normal. No respiratory distress. She has no wheezes.  Abdominal: Soft. Bowel sounds are normal. She exhibits no mass. There is no tenderness.  Genitourinary:  Genitourinary Comments: Left CVA tenderness  Neurological: She is alert and oriented to person, place, and time.  Skin: Skin is warm and dry.  Psychiatric: She has a normal mood and affect.  Nursing note and vitals reviewed.    ED Treatments / Results  Labs (all labs ordered are listed, but only abnormal results are displayed) Labs Reviewed  URINALYSIS, ROUTINE W REFLEX MICROSCOPIC - Abnormal; Notable for the following:       Result Value   APPearance HAZY (*)    Hgb urine dipstick SMALL (*)    Leukocytes, UA MODERATE (*)    Bacteria, UA RARE (*)    Squamous Epithelial / LPF 0-5 (*)     All other components within normal limits    EKG  EKG Interpretation None       Radiology Ct Renal Stone Study  Result Date: 08/06/2017 CLINICAL DATA:  Patient was seen here earlier tonight and diagnosed with a kidney infection. Now sharp constant left-sided flank pain radiating to the groin woke her up at night. EXAM: CT ABDOMEN AND PELVIS WITHOUT CONTRAST TECHNIQUE: Multidetector CT imaging of the abdomen and pelvis was performed following the standard protocol without IV contrast. COMPARISON:  09/20/2014 FINDINGS: Lower chest: Lung bases are clear. Hepatobiliary: No focal liver abnormality is seen. Status post cholecystectomy. No biliary dilatation. Pancreas: Unremarkable. No pancreatic ductal dilatation  or surrounding inflammatory changes. Spleen: Normal in size without focal abnormality. Adrenals/Urinary Tract: No adrenal gland nodules. Multiple bilateral Kayla seal stones are demonstrated within both kidneys. Largest stone is on the left measuring 6 mm in diameter and located in the left lower pole. There is left-sided hydronephrosis and hydroureter extending down to the lower ureter at the level of the acetabulum were there are two 4 mm stones. Stranding around the left ureter and kidney. No ureterectasis on the right. No bladder wall thickening or bladder stones. Stomach/Bowel: Stomach, small bowel, and colon are mostly decompressed. No inflammatory changes. Appendix is normal. Vascular/Lymphatic: No significant vascular findings are present. No enlarged abdominal or pelvic lymph nodes. Reproductive: Uterus and bilateral adnexa are unremarkable. Other: No abdominal wall hernia or abnormality. No abdominopelvic ascites. Musculoskeletal: No acute or significant osseous findings. IMPRESSION: 1. Two 4 mm stones in the distal left ureter above the ureterovesical junction with moderate proximal obstruction. 2. Multiple bilateral nonobstructing intrarenal stones. Electronically Signed   By: Burman Nieves M.D.   On: 08/06/2017 05:16    Procedures Procedures (including critical care time)  Medications Ordered in ED Medications  morphine 4 MG/ML injection 4 mg (4 mg Intravenous Given 08/06/17 0622)  ketorolac (TORADOL) 30 MG/ML injection 30 mg (30 mg Intravenous Given 08/06/17 0319)  ondansetron (ZOFRAN) injection 4 mg (4 mg Intravenous Given 08/06/17 0319)  morphine 4 MG/ML injection 4 mg (4 mg Intravenous Given 08/06/17 0319)     Initial Impression / Assessment and Plan / ED Course  I have reviewed the triage vital signs and the nursing notes.  Pertinent labs & imaging results that were available during my care of the patient were reviewed by me and considered in my medical decision making (see chart for details).  Clinical Course as of Aug 07 623  Mon Aug 06, 2017  3086 Discussed with Dr. Retta Diones.  Recommending repeat urinalysis (cath).  [CH]  5784 Repeat urinalysis with too numerous to count white cells. Now nitrite negative. However, patient did receive a dose of antibiotics approximately 12 hours ago. Discussed again with Dr. Retta Diones.  He feels she likely needs to placement. Patient now having recurrence of pain. She was reduced pain medication. He will come to evaluate the patient later this morning.  [CH]    Clinical Course User Index [CH] Malary Aylesworth, Mayer Masker, MD    Patient presents with recurrent worsening left flank pain. Recent diagnosis of pyelonephritis. I have reviewed her prior visit. She had a white blood cell count of 15. 2 numerous to count white cells and nitrite-positive urine. She is afebrile. Nontoxic. She does appear uncomfortable. Would be concerned for concurrent kidney stone given ongoing and worsening pain. Patient was given pain and nausea medication including Toradol. CT stone study obtained and shows 2 distal 4 mm stones. See clinical course above.  Patient to be evaluated by Dr. Retta Diones for stent placement at [facility].  Final Clinical Impressions(s) /  ED Diagnoses   Final diagnoses:  Kidney stone  Complicated UTI (urinary tract infection)    New Prescriptions New Prescriptions   No medications on file     Shon Baton, MD 08/06/17 [phone]

## 2017-08-07 ENCOUNTER — Encounter (HOSPITAL_COMMUNITY): Payer: Self-pay | Admitting: Urology

## 2017-08-07 DIAGNOSIS — N201 Calculus of ureter: Secondary | ICD-10-CM | POA: Diagnosis not present

## 2017-08-07 LAB — HIV ANTIBODY (ROUTINE TESTING W REFLEX): HIV Screen 4th Generation wRfx: NONREACTIVE

## 2017-08-07 MED ORDER — OXYBUTYNIN CHLORIDE 5 MG PO TABS: 5.0000 mg | ORAL_TABLET | Freq: Three times a day (TID) | ORAL | 0 refills | Status: DC | PRN

## 2017-08-07 MED ORDER — CIPROFLOXACIN HCL 500 MG PO TABS: 500.0000 mg | ORAL_TABLET | Freq: Two times a day (BID) | ORAL | 0 refills | Status: DC

## 2017-08-07 NOTE — Progress Notes (Signed)
Pt discharged in stable condition into the care of her family via wheelchair via private vehicle.  PIV removed intact w/o S&S of complications.  Discharge instructions reviewed with pt.  Pt verbalized understanding. Prescription given to pt.

## 2017-08-07 NOTE — Discharge Instructions (Signed)

## 2017-08-08 ENCOUNTER — Other Ambulatory Visit: Payer: Self-pay | Admitting: Urology

## 2017-08-08 DIAGNOSIS — N200 Calculus of kidney: Secondary | ICD-10-CM | POA: Insufficient documentation

## 2017-08-08 LAB — URINE CULTURE

## 2017-08-09 ENCOUNTER — Telehealth: Payer: Self-pay | Admitting: *Deleted

## 2017-08-09 NOTE — Discharge Summary (Addendum)
Patient ID: Isabella Buchanan MRN: [id number] DOB/AGE: 1981/08/29 36 y.o.  Admit date: 08/06/2017 Discharge date:08/07/2017  Primary Care Physician:  Ladora Daniel, PA-C  Discharge Diagnoses:  Left ureteral calculus, pyuria with negative cultures Present on Admission: . Ureteral calculus, left   Consults:  None   Discharge Medications: Allergies as of 08/07/2017      Reactions   Omnicef [cefdinir] Anaphylaxis   Swelling of tongue   Amoxicillin Nausea And Vomiting, Rash   Celebrex [celecoxib] Nausea And Vomiting   Codeine Nausea Only   Stomach aches   Penicillins Nausea And Vomiting, Rash   Sulfa Antibiotics Nausea And Vomiting      Medication List    TAKE these medications   ciprofloxacin 500 MG tablet Commonly known as:  CIPRO Take 1 tablet (500 mg total) by mouth 2 (two) times daily. 1/2 tab  po bid x 7 days What changed:  additional instructions   ibuprofen 200 MG tablet Commonly known as:  ADVIL,MOTRIN Take 200 mg by mouth every 6 (six) hours as needed for moderate pain.   ondansetron 4 MG disintegrating tablet Commonly known as:  ZOFRAN ODT  ODT q4 hours prn nausea/vomit   oxybutynin 5 MG tablet Commonly known as:  DITROPAN Take 1 tablet (5 mg total) by mouth every 8 (eight) hours as needed for bladder spasms.   pramipexole 1 MG tablet Commonly known as:  MIRAPEX Take 1 mg by mouth at bedtime.   traMADol 50 MG tablet Commonly known as:  ULTRAM Take 1 tablet (50 mg total) by mouth every 6 (six) hours as needed.            Discharge Care Instructions        Start     Ordered   08/07/17 0000  ciprofloxacin (CIPRO) 500 MG tablet  2 times daily     08/07/17 0757   08/07/17 0000  oxybutynin (DITROPAN) 5 MG tablet  Every 8 hours PRN     08/07/17 0757       Significant Diagnostic Studies:  Dg C-arm 1-60 Min-no Report  Result Date: 08/06/2017 Fluoroscopy was utilized by the requesting physician.  No radiographic interpretation.   Ct Renal Stone  Study  Result Date: 08/06/2017 CLINICAL DATA:  Patient was seen here earlier tonight and diagnosed with a kidney infection. Now sharp constant left-sided flank pain radiating to the groin woke her up at night. EXAM: CT ABDOMEN AND PELVIS WITHOUT CONTRAST TECHNIQUE: Multidetector CT imaging of the abdomen and pelvis was performed following the standard protocol without IV contrast. COMPARISON:  09/20/2014 FINDINGS: Lower chest: Lung bases are clear. Hepatobiliary: No focal liver abnormality is seen. Status post cholecystectomy. No biliary dilatation. Pancreas: Unremarkable. No pancreatic ductal dilatation or surrounding inflammatory changes. Spleen: Normal in size without focal abnormality. Adrenals/Urinary Tract: No adrenal gland nodules. Multiple bilateral Kayla seal stones are demonstrated within both kidneys. Largest stone is on the left measuring 6 mm in diameter and located in the left lower pole. There is left-sided hydronephrosis and hydroureter extending down to the lower ureter at the level of the acetabulum were there are two 4 mm stones. Stranding around the left ureter and kidney. No ureterectasis on the right. No bladder wall thickening or bladder stones. Stomach/Bowel: Stomach, small bowel, and colon are mostly decompressed. No inflammatory changes. Appendix is normal. Vascular/Lymphatic: No significant vascular findings are present. No enlarged abdominal or pelvic lymph nodes. Reproductive: Uterus and bilateral adnexa are unremarkable. Other: No abdominal wall hernia or abnormality. No abdominopelvic  ascites. Musculoskeletal: No acute or significant osseous findings. IMPRESSION: 1. Two 4 mm stones in the distal left ureter above the ureterovesical junction with moderate proximal obstruction. 2. Multiple bilateral nonobstructing intrarenal stones. Electronically Signed   By: Burman Nieves M.D.   On: 08/06/2017 05:16    Brief H and P: For complete details please refer to admission H and P, but  in brief , the patient was admitted for urgent cystoscopy and stent placement.  She presented with left distal ureteral stones, hydronephrosis and pyuria.  Hospital Course:  Active Problems:   Ureteral calculus, left On the day of admission, the patient underwent urgent cystoscopy and left double-J stent placement.  She tolerated the procedure well.  She was kept in the hospital for IV antibiotics.  During hospitalization, culture was negative.  It was felt that she or them likely just had pyuria with negative cultures.  As she tolerated her hospitalization well, status improved, she was discharged on   Day of Discharge BP (!) 162/87 (BP Location: Right Arm)   Pulse 80   Temp 98.1 F (36.7 C) (Oral)   Resp 18   Ht  (1.676 m)   Wt 91.2 kg (201 lb)   LMP 07/17/2017   SpO2 100%   BMI 32.44 kg/m   No results found for this or any previous visit (from the past 24 hour(s)).  Physical Exam: General: Alert and awake oriented x3 not in any acute distress. HEENT: anicteric sclera, pupils reactive to light and accommodation CVS: S1-S2 clear no murmur rubs or gallops Chest: clear to auscultation bilaterally, no wheezing rales or rhonchi Abdomen: soft nontender, nondistended, normal bowel sounds, no organomegaly Extremities: no cyanosis, clubbing or edema noted bilaterally Neuro: Cranial nerves II-XII intact, no focal neurological deficits  Disposition:  Home  Diet:  No restrictions  Activity:  No restrictions     TESTS THAT NEED FOLLOW-UP  N/A  DISCHARGE FOLLOW-UP Follow-up Information    Marcine Matar, MD Follow up.   Specialty:  Urology Why:  we will call you to set up appt/surgery Contact information: [address] [place] [address] [phone]           Time spent on Discharge:  15 mins  Signed: Chelsea Aus 08/09/2017, 9:25 AM

## 2017-08-09 NOTE — Telephone Encounter (Signed)
Post ED Visit - Positive Culture Follow-up  Culture report reviewed by antimicrobial stewardship pharmacist:   Enzo Bi, Pharm.D.  Celedonio Miyamoto, [address].D., BCPS AQ-ID  Garvin Fila, Pharm.D., BCPS  Georgina Pillion, [address].D., BCPS  [place], [address].D., BCPS, AAHIVP  Estella Husk, Pharm.D., BCPS, AAHIVP  Lysle Pearl, PharmD, BCPS  Casilda Carls, PharmD, BCPS  Pollyann Samples, PharmD, BCPS  Positive urine culture Treated with Ciprofloxacin HCL, organism sensitive to the same and no further patient follow-up is required at this time.  Virl Axe [facility] 08/09/2017, 12:27 PM

## 2017-08-11 ENCOUNTER — Encounter (HOSPITAL_COMMUNITY): Payer: Self-pay | Admitting: *Deleted

## 2017-08-11 ENCOUNTER — Emergency Department (HOSPITAL_COMMUNITY)
Admission: EM | Admit: 2017-08-11 | Discharge: 2017-08-12 | Disposition: A | Discharge status: Home / Self Care | Payer: Medicaid Other | Attending: Emergency Medicine | Admitting: Emergency Medicine

## 2017-08-11 DIAGNOSIS — F17210 Nicotine dependence, cigarettes, uncomplicated: Secondary | ICD-10-CM | POA: Insufficient documentation

## 2017-08-11 DIAGNOSIS — R1032 Left lower quadrant pain: Secondary | ICD-10-CM | POA: Insufficient documentation

## 2017-08-11 DIAGNOSIS — R1115 Cyclical vomiting syndrome unrelated to migraine: Secondary | ICD-10-CM

## 2017-08-11 DIAGNOSIS — I10 Essential (primary) hypertension: Secondary | ICD-10-CM | POA: Diagnosis not present

## 2017-08-11 DIAGNOSIS — R112 Nausea with vomiting, unspecified: Secondary | ICD-10-CM | POA: Diagnosis not present

## 2017-08-11 DIAGNOSIS — R109 Unspecified abdominal pain: Secondary | ICD-10-CM

## 2017-08-11 LAB — BASIC METABOLIC PANEL
ANION GAP: 8 (ref 5–15)
BUN: 18 mg/dL (ref 6–20)
CHLORIDE: 103 mmol/L (ref 101–111)
CO2: 26 mmol/L (ref 22–32)
Calcium: 9.5 mg/dL (ref 8.9–10.3)
Creatinine, Ser: 0.83 mg/dL (ref 0.44–1.00)
GFR calc Af Amer: >60 mL/min (ref >60)
GFR calc non Af Amer: >60 mL/min (ref >60)
GLUCOSE: 125 mg/dL — AB (ref 65–99)
POTASSIUM: 3.9 mmol/L (ref 3.5–5.1)
SODIUM: 137 mmol/L (ref 135–145)

## 2017-08-11 LAB — CBC WITH DIFFERENTIAL/PLATELET
Basophils Absolute: 0.1 10*3/uL (ref 0.0–0.1)
Basophils Relative: 1 %
Eosinophils Absolute: 0.5 10*3/uL (ref 0.0–0.7)
Eosinophils Relative: 4 %
HCT: 42.2 % (ref 36.0–46.0)
HEMOGLOBIN: 14.1 g/dL (ref 12.0–15.0)
LYMPHS ABS: 4.5 10*3/uL — AB (ref 0.7–4.0)
LYMPHS PCT: 34 %
MCH: 30.7 pg (ref 26.0–34.0)
MCHC: 33.4 g/dL (ref 30.0–36.0)
MCV: 91.9 fL (ref 78.0–100.0)
Monocytes Absolute: 0.9 10*3/uL (ref 0.1–1.0)
Monocytes Relative: 7 %
NEUTROS PCT: 54 %
Neutro Abs: 7.2 10*3/uL (ref 1.7–7.7)
Platelets: 382 10*3/uL (ref 150–400)
RBC: 4.59 MIL/uL (ref 3.87–5.11)
RDW: 14.4 % (ref 11.5–15.5)
WBC: 13.1 10*3/uL — AB (ref 4.0–10.5)

## 2017-08-11 LAB — PREGNANCY, URINE: Preg Test, Ur: NEGATIVE

## 2017-08-11 MED ORDER — ONDANSETRON HCL 4 MG/2ML IJ SOLN
4.0000 mg | Freq: Once | INTRAMUSCULAR | Status: AC
  Administered 2017-08-11: 4 mg via INTRAVENOUS
  Filled 2017-08-11: qty 2

## 2017-08-11 MED ORDER — FENTANYL CITRATE (PF) 100 MCG/2ML IJ SOLN
50.0000 ug | Freq: Once | INTRAMUSCULAR | Status: AC
  Administered 2017-08-11: 50 ug via INTRAVENOUS
  Filled 2017-08-11: qty 2

## 2017-08-11 MED ORDER — SODIUM CHLORIDE 0.9 % IV BOLUS (SEPSIS)
1000.0000 mL | Freq: Once | INTRAVENOUS | Status: AC
  Administered 2017-08-11: 1000 mL via INTRAVENOUS

## 2017-08-11 NOTE — ED Triage Notes (Signed)
Left flank pain

## 2017-08-11 NOTE — ED Provider Notes (Signed)
[facility] Provider Note   CSN: [id number] Arrival date & time: 08/11/17  2056     History   Chief Complaint Chief Complaint  Patient presents with  . Flank Pain    HPI Isabella Buchanan is a 36 y.o. female.  The history is provided by the patient.  Flank Pain  This is a new problem. The current episode started 3 to 5 hours ago. The problem occurs constantly. The problem has been rapidly worsening. Associated symptoms include abdominal pain. Pertinent negatives include no chest pain and no shortness of breath. Nothing aggravates the symptoms. Nothing relieves the symptoms.   Pt with recent left ureteral stent placement on 9/18, did well post op and went home However over past several hrs her pain has returned with nausea/vomiting No fever She reports dysuria She reports left flank pain is similar to prior episodes with kidney stones She has been taking antibiotics (cipro)  Past Medical History:  Diagnosis Date  . Anxiety   . Chronic back pain   . Chronic flank pain   . Chronic pain   . DOE (dyspnea on exertion)   . Fibromyalgia   . GERD (gastroesophageal reflux disease)   . Headache(784.0)    mygrains  . Hiatal hernia   . History of electroencephalogram 11/2013   normal EEG, "psychogenic nonepileptic spell" during EEG  . Hypertension   . IBS (irritable bowel syndrome)   . Interstitial cystitis   . Kidney stones   . Polycystic ovarian syndrome   . PONV (postoperative nausea and vomiting)   . Restless leg   . Seizures (HCC)   . Swelling     Patient Active Problem List   Diagnosis Date Noted  . Ureteral calculus, left 08/06/2017  . Morbid obesity (HCC) 08/02/2016  . Hypertriglyceridemia without hypercholesterolemia 08/02/2016  . Tobacco abuse 08/02/2016  . Essential hypertriglyceridemia 06/30/2015  . Combined fat and carbohydrate induced hyperlipemia 06/30/2015  . Nicotine addiction 06/28/2015  . Cyclic vomiting syndrome 06/28/2015  . Avitaminosis D  06/28/2015  . Essential hypertension 05/04/2015  . Degeneration of intervertebral disc of lumbar region 02/05/2015  . Anxiety, generalized 02/05/2015  . Community acquired pneumonia 07/17/2014  . CAP (community acquired pneumonia) 07/17/2014  . Sepsis (HCC) 07/17/2014  . Encephalopathy 07/17/2014  . Acute respiratory failure (HCC) 07/17/2014  . Seizure (HCC) 12/13/2013  . Seizures (HCC) 12/13/2013  . History of IBS 12/13/2013  . Fibromyalgia   . Chronic back pain   . Interstitial cystitis   . Polycystic ovarian syndrome     Past Surgical History:  Procedure Laterality Date  . CHOLECYSTECTOMY    . CYSTOSCOPY W/ URETERAL STENT PLACEMENT Left 08/06/2017   Procedure: CYSTOSCOPY WITH RETROGRADE PYELOGRAM/URETERAL STENT PLACEMENT;  Surgeon: Marcine Matar, MD;  Location: [facility];  Service: Urology;  Laterality: Left;  . ESOPHAGOGASTRODUODENOSCOPY ENDOSCOPY    . TONSILLECTOMY    . TUBAL LIGATION    . TYMPANOPLASTY      OB History    Gravida Para Term Preterm AB Living   SAB TAB Ectopic Multiple Live Births                   Home Medications    Prior to Admission medications   Medication Sig Start Date End Date Taking? Authorizing Provider  ciprofloxacin (CIPRO) 500 MG tablet Take 1 tablet (500 mg total) by mouth 2 (two) times daily. 1/2 tab  po bid x 7 days 08/07/17  Yes Marcine Matar, MD  clonazePAM (KLONOPIN) 0.5 MG tablet Take 0.25-0.5 mg by mouth 2 (two) times daily as needed. 08/08/17  Yes [provider]  lisinopril (PRINIVIL,ZESTRIL) 10 MG tablet Take 10 mg by mouth daily. 08/08/17 02/04/18 Yes [provider]  ondansetron (ZOFRAN ODT) 4 MG disintegrating tablet  ODT q4 hours prn nausea/vomit Patient taking differently: Take 4 mg by mouth every 4 (four) hours as needed for nausea or vomiting.  08/05/17  Yes Bethann Berkshire, MD  oxybutynin (DITROPAN) 5 MG tablet Take 1 tablet (5 mg total) by mouth every 8 (eight) hours as needed for  bladder spasms. 08/07/17  Yes Dahlstedt, Jeannett Senior, MD  pramipexole (MIRAPEX) 1 MG tablet Take 1 mg by mouth at bedtime.   Yes [provider]  traMADol (ULTRAM) 50 MG tablet Take 1 tablet (50 mg total) by mouth every 6 (six) hours as needed. Patient taking differently: Take 50 mg by mouth every 6 (six) hours as needed for moderate pain.  08/05/17  Yes Bethann Berkshire, MD    Family History Family History  Problem Relation Age of Onset  . Hypertension Father   . Diabetes Father   . Seizures Father   . Cancer Other   . Diabetes Other     Social History Social History  Substance Use Topics  . Smoking status: Current Every Day Smoker    Packs/day: 2.00    Years: 15.00    Types: Cigarettes  . Smokeless tobacco: Never Used  . Alcohol use Yes     Comment: rarely     Allergies   Omnicef [cefdinir]; Amoxicillin; Celebrex [celecoxib]; Codeine; Penicillins; and Sulfa antibiotics   Review of Systems Review of Systems  Constitutional: Negative for fever.  Respiratory: Negative for shortness of breath.   Cardiovascular: Negative for chest pain.  Gastrointestinal: Positive for abdominal pain.  Genitourinary: Positive for flank pain.  All other systems reviewed and are negative.    Physical Exam Updated Vital Signs BP (!) 142/91 (BP Location: Left Arm)   Pulse 81   Temp 98.5 F (36.9 C) (Oral)   Resp 16   Ht 1.676 m ( )   Wt 91.2 kg (201 lb)   LMP 07/17/2017   SpO2 99%   BMI 32.44 kg/m   Physical Exam  CONSTITUTIONAL: Well developed/well nourished, uncomfortable appearing HEAD: Normocephalic/atraumatic EYES: EOMI/PERRL ENMT: Mucous membranes moist NECK: supple no meningeal signs SPINE/BACK:entire spine nontender CV: S1/S2 noted, no murmurs/rubs/gallops noted LUNGS: Lungs are clear to auscultation bilaterally, no apparent distress ABDOMEN: soft, nontender, no rebound or guarding, bowel sounds noted throughout abdomen WJ:XBJY cva tenderness NEURO: Pt is  awake/alert/appropriate, moves all extremitiesx4.  No facial droop.   EXTREMITIES: pulses normal/equal, full ROM SKIN: warm, color normal PSYCH: no abnormalities of mood noted, alert and oriented to situation  ED Treatments / Results  Labs (all labs ordered are listed, but only abnormal results are displayed) Labs Reviewed  URINALYSIS, ROUTINE W REFLEX MICROSCOPIC - Abnormal; Notable for the following:       Result Value   APPearance HAZY (*)    Hgb urine dipstick LARGE (*)    Protein, ur 30 (*)    Leukocytes, UA SMALL (*)    Bacteria, UA RARE (*)    Squamous Epithelial / LPF 6-30 (*)    All other components within normal limits  CBC WITH DIFFERENTIAL/PLATELET - Abnormal; Notable for the following:    WBC 13.1 (*)    Lymphs Abs 4.5 (*)    All other  components within normal limits  BASIC METABOLIC PANEL - Abnormal; Notable for the following:    Glucose, Bld 125 (*)    All other components within normal limits  URINE CULTURE  PREGNANCY, URINE    EKG  EKG Interpretation None       Radiology No results found.  Procedures Procedures    Medications Ordered in ED Medications  fentaNYL (SUBLIMAZE) injection 50 mcg (50 mcg Intravenous Given 08/11/17 2350)  sodium chloride 0.9 % bolus 1,000 mL (0 mLs Intravenous Stopped 08/12/17 0113)  ondansetron (ZOFRAN) injection 4 mg (4 mg Intravenous Given 08/11/17 2350)     Initial Impression / Assessment and Plan / ED Course  I have reviewed the triage vital signs and the nursing notes.  Pertinent labs   results that were available during my care of the patient were reviewed by me and considered in my medical decision making (see chart for details).     12:00 AM Pt with recent left ureteral stent placement She had return of pain several hrs ago Will treat pain/nausea and reassess She is not septic appearing 1:15 AM Pt improved Taking PO  WBC improved No fever Pain controlled Urine culture sent Advised to continue oral  antibiotics We discussed strict ER return precautions  Final Clinical Impressions(s) / ED Diagnoses   Final diagnoses:  Left flank pain  Non-intractable cyclical vomiting with nausea    New Prescriptions New Prescriptions   No medications on file     Zadie Rhine, MD 08/12/17 0116

## 2017-08-12 LAB — URINALYSIS, ROUTINE W REFLEX MICROSCOPIC
BILIRUBIN URINE: NEGATIVE
Glucose, UA: NEGATIVE mg/dL
Ketones, ur: NEGATIVE mg/dL
NITRITE: NEGATIVE
PH: 5 (ref 5.0–8.0)
Protein, ur: 30 mg/dL — AB
SPECIFIC GRAVITY, URINE: 1.009 (ref 1.005–1.030)

## 2017-08-12 NOTE — ED Notes (Signed)
Pt given water per request- ok per Dr Bebe Shaggy

## 2017-08-12 NOTE — ED Notes (Signed)
ED Provider at bedside. 

## 2017-08-14 LAB — URINE CULTURE: Culture: <10000 — AB

## 2017-08-15 ENCOUNTER — Encounter (HOSPITAL_COMMUNITY)
Admission: RE | Admit: 2017-08-15 | Discharge: 2017-08-15 | Disposition: A | Discharge status: Home / Self Care | Payer: Medicaid Other | Source: Ambulatory Visit | Attending: Urology | Admitting: Urology

## 2017-08-16 ENCOUNTER — Encounter (HOSPITAL_COMMUNITY): Payer: Self-pay

## 2017-08-21 ENCOUNTER — Ambulatory Visit (HOSPITAL_COMMUNITY)
Admission: RE | Admit: 2017-08-21 | Discharge: 2017-08-21 | Disposition: A | Discharge status: Home / Self Care | Payer: Medicaid Other | Source: Ambulatory Visit | Attending: Urology | Admitting: Urology

## 2017-08-21 ENCOUNTER — Ambulatory Visit (HOSPITAL_COMMUNITY): Payer: Medicaid Other | Admitting: Anesthesiology

## 2017-08-21 ENCOUNTER — Encounter (HOSPITAL_COMMUNITY): Payer: Self-pay | Admitting: Emergency Medicine

## 2017-08-21 ENCOUNTER — Ambulatory Visit (HOSPITAL_COMMUNITY): Payer: Medicaid Other

## 2017-08-21 ENCOUNTER — Encounter (HOSPITAL_COMMUNITY)
Admission: RE | Disposition: A | Discharge status: Home / Self Care | Payer: Self-pay | Source: Ambulatory Visit | Attending: Urology

## 2017-08-21 DIAGNOSIS — E282 Polycystic ovarian syndrome: Secondary | ICD-10-CM | POA: Insufficient documentation

## 2017-08-21 DIAGNOSIS — K219 Gastro-esophageal reflux disease without esophagitis: Secondary | ICD-10-CM | POA: Insufficient documentation

## 2017-08-21 DIAGNOSIS — Z888 Allergy status to other drugs, medicaments and biological substances status: Secondary | ICD-10-CM | POA: Diagnosis not present

## 2017-08-21 DIAGNOSIS — G2581 Restless legs syndrome: Secondary | ICD-10-CM | POA: Insufficient documentation

## 2017-08-21 DIAGNOSIS — F17210 Nicotine dependence, cigarettes, uncomplicated: Secondary | ICD-10-CM | POA: Insufficient documentation

## 2017-08-21 DIAGNOSIS — N202 Calculus of kidney with calculus of ureter: Secondary | ICD-10-CM | POA: Insufficient documentation

## 2017-08-21 DIAGNOSIS — Z885 Allergy status to narcotic agent status: Secondary | ICD-10-CM | POA: Diagnosis not present

## 2017-08-21 DIAGNOSIS — I10 Essential (primary) hypertension: Secondary | ICD-10-CM | POA: Diagnosis not present

## 2017-08-21 DIAGNOSIS — Z880 Allergy status to penicillin: Secondary | ICD-10-CM | POA: Insufficient documentation

## 2017-08-21 DIAGNOSIS — Z882 Allergy status to sulfonamides status: Secondary | ICD-10-CM | POA: Diagnosis not present

## 2017-08-21 DIAGNOSIS — Z79899 Other long term (current) drug therapy: Secondary | ICD-10-CM | POA: Diagnosis not present

## 2017-08-21 DIAGNOSIS — N201 Calculus of ureter: Secondary | ICD-10-CM

## 2017-08-21 DIAGNOSIS — K589 Irritable bowel syndrome without diarrhea: Secondary | ICD-10-CM | POA: Insufficient documentation

## 2017-08-21 DIAGNOSIS — F419 Anxiety disorder, unspecified: Secondary | ICD-10-CM | POA: Diagnosis not present

## 2017-08-21 DIAGNOSIS — M797 Fibromyalgia: Secondary | ICD-10-CM | POA: Insufficient documentation

## 2017-08-21 DIAGNOSIS — Z87442 Personal history of urinary calculi: Secondary | ICD-10-CM | POA: Diagnosis not present

## 2017-08-21 HISTORY — PX: CYSTOSCOPY/URETEROSCOPY/HOLMIUM LASER/STENT PLACEMENT: SHX6546

## 2017-08-21 SURGERY — CYSTOSCOPY/URETEROSCOPY/HOLMIUM LASER/STENT PLACEMENT
Anesthesia: General | Laterality: Left

## 2017-08-21 MED ORDER — ROCURONIUM BROMIDE 50 MG/5ML IV SOLN
INTRAVENOUS | Status: AC
  Filled 2017-08-21: qty 1

## 2017-08-21 MED ORDER — CIPROFLOXACIN IN D5W 400 MG/200ML IV SOLN
400.0000 mg | INTRAVENOUS | Status: AC
  Administered 2017-08-21: 400 mg via INTRAVENOUS

## 2017-08-21 MED ORDER — LIDOCAINE HCL (PF) 1 % IJ SOLN
INTRAMUSCULAR | Status: DC | PRN
  Administered 2017-08-21: 50 mg

## 2017-08-21 MED ORDER — GLYCOPYRROLATE 0.2 MG/ML IJ SOLN
INTRAMUSCULAR | Status: DC | PRN
  Administered 2017-08-21: 0.4 mg via INTRAVENOUS

## 2017-08-21 MED ORDER — MIDAZOLAM HCL 2 MG/2ML IJ SOLN
INTRAMUSCULAR | Status: AC
  Filled 2017-08-21: qty 2

## 2017-08-21 MED ORDER — PROMETHAZINE HCL 25 MG/ML IJ SOLN
6.2500 mg | Freq: Once | INTRAMUSCULAR | Status: AC
  Administered 2017-08-21: 6.25 mg via INTRAVENOUS

## 2017-08-21 MED ORDER — LIDOCAINE HCL (PF) 1 % IJ SOLN
INTRAMUSCULAR | Status: AC
  Filled 2017-08-21: qty 5

## 2017-08-21 MED ORDER — NEOSTIGMINE METHYLSULFATE 10 MG/10ML IV SOLN
INTRAVENOUS | Status: DC | PRN
  Administered 2017-08-21: 3 mg via INTRAVENOUS

## 2017-08-21 MED ORDER — SUCCINYLCHOLINE CHLORIDE 20 MG/ML IJ SOLN
INTRAMUSCULAR | Status: AC
  Filled 2017-08-21: qty 1

## 2017-08-21 MED ORDER — PROPOFOL 10 MG/ML IV BOLUS
INTRAVENOUS | Status: AC
  Filled 2017-08-21: qty 20

## 2017-08-21 MED ORDER — SODIUM CHLORIDE 0.9% FLUSH
INTRAVENOUS | Status: AC
  Filled 2017-08-21: qty 10

## 2017-08-21 MED ORDER — MIDAZOLAM HCL 2 MG/2ML IJ SOLN
1.0000 mg | INTRAMUSCULAR | Status: AC
  Administered 2017-08-21: 2 mg via INTRAVENOUS

## 2017-08-21 MED ORDER — PROMETHAZINE HCL 25 MG/ML IJ SOLN
INTRAMUSCULAR | Status: AC
  Filled 2017-08-21: qty 1

## 2017-08-21 MED ORDER — FENTANYL CITRATE (PF) 100 MCG/2ML IJ SOLN
25.0000 ug | INTRAMUSCULAR | Status: DC | PRN
  Administered 2017-08-21: 50 ug via INTRAVENOUS

## 2017-08-21 MED ORDER — ONDANSETRON HCL 4 MG/2ML IJ SOLN
4.0000 mg | Freq: Once | INTRAMUSCULAR | Status: AC
  Administered 2017-08-21: 4 mg via INTRAVENOUS

## 2017-08-21 MED ORDER — CIPROFLOXACIN IN D5W 400 MG/200ML IV SOLN
INTRAVENOUS | Status: AC
  Filled 2017-08-21: qty 200

## 2017-08-21 MED ORDER — DIATRIZOATE MEGLUMINE 30 % UR SOLN
URETHRAL | Status: AC
  Filled 2017-08-21: qty 300

## 2017-08-21 MED ORDER — ONDANSETRON HCL 4 MG/2ML IJ SOLN
INTRAMUSCULAR | Status: AC
  Filled 2017-08-21: qty 2

## 2017-08-21 MED ORDER — SUCCINYLCHOLINE CHLORIDE 20 MG/ML IJ SOLN
INTRAMUSCULAR | Status: DC | PRN
  Administered 2017-08-21: 100 mg via INTRAVENOUS

## 2017-08-21 MED ORDER — LACTATED RINGERS IV SOLN
INTRAVENOUS | Status: DC
  Administered 2017-08-21 (×2): via INTRAVENOUS

## 2017-08-21 MED ORDER — ONDANSETRON HCL 4 MG/2ML IJ SOLN
INTRAMUSCULAR | Status: AC
Start: 2017-08-21 — End: 2017-08-21
  Filled 2017-08-21: qty 2

## 2017-08-21 MED ORDER — GLYCOPYRROLATE 0.2 MG/ML IJ SOLN
INTRAMUSCULAR | Status: AC
  Filled 2017-08-21: qty 2

## 2017-08-21 MED ORDER — FENTANYL CITRATE (PF) 100 MCG/2ML IJ SOLN
INTRAMUSCULAR | Status: AC
  Filled 2017-08-21: qty 2

## 2017-08-21 MED ORDER — PROPOFOL 10 MG/ML IV BOLUS
INTRAVENOUS | Status: DC | PRN
  Administered 2017-08-21: 20 mg via INTRAVENOUS
  Administered 2017-08-21: 180 mg via INTRAVENOUS

## 2017-08-21 MED ORDER — NEOSTIGMINE METHYLSULFATE 10 MG/10ML IV SOLN
INTRAVENOUS | Status: AC
  Filled 2017-08-21: qty 1

## 2017-08-21 MED ORDER — SODIUM CHLORIDE 0.9 % IR SOLN
Status: DC | PRN
  Administered 2017-08-21 (×2): 3000 mL

## 2017-08-21 MED ORDER — FENTANYL CITRATE (PF) 100 MCG/2ML IJ SOLN
INTRAMUSCULAR | Status: DC | PRN
  Administered 2017-08-21: 100 ug via INTRAVENOUS

## 2017-08-21 MED ORDER — ROCURONIUM BROMIDE 100 MG/10ML IV SOLN
INTRAVENOUS | Status: DC | PRN
  Administered 2017-08-21: 5 mg via INTRAVENOUS
  Administered 2017-08-21: 20 mg via INTRAVENOUS

## 2017-08-21 MED ORDER — WATER FOR IRRIGATION, STERILE IR SOLN
Status: DC | PRN
  Administered 2017-08-21: 1000 mL

## 2017-08-21 SURGICAL SUPPLY — 36 items
BAG DRAIN URO TABLE W/ADPT NS (DRAPE) ×2 IMPLANT
BAG HAMPER (MISCELLANEOUS) ×2 IMPLANT
BAG URINE LEG 500ML (DRAIN) IMPLANT
BASKET STNLS GEMINI 4WIRE 3FR (BASKET) IMPLANT
BASKET ZERO TIP NITINOL 2.4FR (BASKET) IMPLANT
CATH FOLEY 2WAY SLVR  5CC 18FR (CATHETERS)
CATH FOLEY 2WAY SLVR 5CC 18FR (CATHETERS) IMPLANT
CATH INTERMIT  6FR 70CM (CATHETERS) ×2 IMPLANT
CLOTH BEACON ORANGE TIMEOUT ST (SAFETY) ×2 IMPLANT
ELECT REM PT RETURN 9FT ADLT (ELECTROSURGICAL)
ELECTRODE REM PT RTRN 9FT ADLT (ELECTROSURGICAL) IMPLANT
EXTRACTOR STONE NITINOL NGAGE (UROLOGICAL SUPPLIES) ×2 IMPLANT
FORMALIN 10 PREFIL 120ML (MISCELLANEOUS) IMPLANT
GLOVE BIOGEL M 8.0 STRL (GLOVE) ×2 IMPLANT
GOWN STRL REIN XL XLG (GOWN DISPOSABLE) ×2 IMPLANT
GOWN STRL REUS W/TWL LRG LVL3 (GOWN DISPOSABLE) IMPLANT
GOWN STRL REUS W/TWL XL LVL3 (GOWN DISPOSABLE) ×4 IMPLANT
GUIDEWIRE ANG ZIPWIRE 038X150 (WIRE) ×2 IMPLANT
GUIDEWIRE STR DUAL SENSOR (WIRE) ×2 IMPLANT
IV NS IRRIG 3000ML ARTHROMATIC (IV SOLUTION) ×4 IMPLANT
KIT ROOM TURNOVER AP CYSTO (KITS) ×2 IMPLANT
LASER FIBER DISP (UROLOGICAL SUPPLIES) IMPLANT
MANIFOLD NEPTUNE II (INSTRUMENTS) ×2 IMPLANT
NEEDLE HYPO 22GX1.5 SAFETY (NEEDLE) IMPLANT
NS IRRIG 500ML POUR BTL (IV SOLUTION) IMPLANT
PACK CYSTO (CUSTOM PROCEDURE TRAY) ×2 IMPLANT
PAD ARMBOARD 7.5X6 YLW CONV (MISCELLANEOUS) ×2 IMPLANT
PAD TELFA 3X4 1S STER (GAUZE/BANDAGES/DRESSINGS) IMPLANT
SHEATH ACCESS URETERAL 24CM (SHEATH) ×2 IMPLANT
SHEATH URET ACCESS 12FR/35CM (UROLOGICAL SUPPLIES) IMPLANT
STENT URET 6FRX24 CONTOUR (STENTS) ×2 IMPLANT
SYR CONTROL 10ML LL (SYRINGE) IMPLANT
SYRINGE IRR TOOMEY STRL 70CC (SYRINGE) ×2 IMPLANT
TOWEL OR 17X26 4PK STRL BLUE (TOWEL DISPOSABLE) IMPLANT
WATER STERILE IRR 1000ML UROMA (IV SOLUTION) IMPLANT
WATER STERILE IRR 3000ML UROMA (IV SOLUTION) IMPLANT

## 2017-08-21 NOTE — Anesthesia Preprocedure Evaluation (Signed)
Anesthesia Evaluation  Patient identified by MRN, date of birth, ID band Patient awake    Reviewed: Allergy & Precautions, NPO status , Patient's Chart, lab work & pertinent test results  History of Anesthesia Complications (+) PONV and history of anesthetic complications  Airway Mallampati: II  TM Distance: >3 FB Neck ROM: Full    Dental  (+) Edentulous Upper, Edentulous Lower   Pulmonary pneumonia (CAP), resolved, Current Smoker,    breath sounds clear to auscultation       Cardiovascular hypertension, Pt. on medications + DOE   Rhythm:Regular Rate:Normal     Neuro/Psych  Headaches, Seizures -,  PSYCHIATRIC DISORDERS Anxiety    GI/Hepatic hiatal hernia, GERD  Medicated,  Endo/Other  Morbid obesityPolycystic ovarian syndrome  Renal/GU Renal disease     Musculoskeletal  (+) Fibromyalgia -  Abdominal   Peds  Hematology   Anesthesia Other Findings   Reproductive/Obstetrics                             Anesthesia Physical Anesthesia Plan  ASA: III and emergent  Anesthesia Plan: General   Post-op Pain Management:    Induction: Intravenous, Rapid sequence and Cricoid pressure planned  PONV Risk Score and Plan:   Airway Management Planned: Oral ETT  Additional Equipment:   Intra-op Plan:   Post-operative Plan: Extubation in OR  Informed Consent: I have reviewed the patients History and Physical, chart, labs and discussed the procedure including the risks, benefits and alternatives for the proposed anesthesia with the patient or authorized representative who has indicated his/her understanding and acceptance.     Plan Discussed with:   Anesthesia Plan Comments:         Anesthesia Quick Evaluation

## 2017-08-21 NOTE — Discharge Instructions (Signed)
POSTOPERATIVE CARE AFTER URETEROSCOPY  Stent management  *Stents are often left in after ureteroscopy and stone treatment. If left in, they often cause urinary frequency, urgency, occasional blood in the urine, as well as flank discomfort with urination. These are all expected issues, and should resolve after the stent is removed.  The thread for the stent is tucked within your vagina.  On Thursday morning, it is okay to pull the thread to remove the stent.  *Often times, a small thread is left on the end of the stent, and brought out through the urethra. If so, this is used to remove the stent, making it unnecessary to look in the bladder with a scope in the office to remove the stent. If a thread is left on, did not pull on it until instructed.  Diet  Once you have adequately recovered from anesthesia, you may gradually advance your diet, as tolerated, to your regular diet.  Activities  You may gradually increase your activities to your normal unrestricted level the day following your procedure.  Medications  You should resume all preoperative medications. If you are on aspirin-like compounds, you should not resume these until the blood clears from your urine. If given an antibiotic by the surgeon, take these until they are completed. You may also be given, if you have a stent, medications to decrease the urinary frequency and urgency.  Pain  After ureteroscopy, there may be some pain on the side of the scope. Take your pain medicine for this. Usually, this pain resolves within a day or 2.  Fever  Please report any fever over 100 to the doctor.

## 2017-08-21 NOTE — Anesthesia Postprocedure Evaluation (Signed)
Anesthesia Post Note  Patient: Isabella Buchanan  Procedure(s) Performed: CYSTOSCOPY,LEFT URETERAL STENT REMOVAL, LEFT URETEROSCOPY, STONE BASKET EXTRACTION AND LEFT URETERAL STENT PLACEMENT (Left )  Patient location during evaluation: PACU Anesthesia Type: General Level of consciousness: awake, awake and alert, oriented and patient cooperative Pain management: pain level controlled Vital Signs Assessment: post-procedure vital signs reviewed and stable Respiratory status: spontaneous breathing, nonlabored ventilation and respiratory function stable Cardiovascular status: stable Postop Assessment: no apparent nausea or vomiting (treated with meds by PACU RN) Anesthetic complications: no     Last Vitals:  Vitals:   08/21/17 1347 08/21/17 1400  BP: 128/86 138/75  Pulse: 96 84  Resp: (!) 21 18  Temp: 37 C   SpO2: 94% 100%    Last Pain:  Vitals:   08/21/17 1347  TempSrc:   PainSc: 0-No pain                 Osias Resnick L

## 2017-08-21 NOTE — Op Note (Signed)
Preoperative diagnosis left ureteral and renal calculi.  Postoperative diagnosis: Same.  Procedure: Cystoscopy, left double-J stent extraction, left ureteroscopy (rigid and flexible), extraction of multiple left ureteral and renal calculi, placement of 6 French by 24 centimeter contoured double-J stent and left ureter with tether.  Surgeon: Retta Diones  Anesthesia: Gen. with LMA.  Complications: None.  Specimen: Stone fragments.  Allograft estimated blood loss: None.  Indications: 36 year old female with recent presentation with infected left ureteral calculi.  She was stented.  There were renal calculi identified as well on her CT scan at the time of her presentation.  She presents now, with her infection haven't been treated, for definiteive management of her left ureteral and renal calculi.  The procedure of left ureteroscopy, possible holmium laser and extraction of her stones as well as stent replacement have been discussed with the patient.  She understands the procedure as well as risks and complications and desires to proceed.  Findings: 2-3 left ureteral calculi, multiple left calyceal calculi, normal urothelium of the bladder.  Description of procedure: The patient was properly identified and marked in the holding area.  She presented to the operating room where general anesthetic was administered with the LMA.  She was placed in the dorsolithotomy position.  Genitalia and perineum were prepped and draped.  Proper timeout was performed.  A 21 French panendoscope was advanced into her bladder, which was briefly inspected and found to be normal.  The stent within the bladder was grasped and brought out through the urethra.  A 0.038 inch sensor tip guidewire was advanced through the stent and fluoroscopically guided into the left upper pole.  The stent was then extracted over top of the wire.  I then dilated the distal ureter with a 12/14, 24 centimeter ureteral access catheter.  This easily  dilated the distal ureter.  The access catheter was removed.  I placed a 6 French dual-lumen semirigid ureteroscope through her urethra and up into the ureter where the stones were identified, grasped, and then brought into the bladder.  No further ureteral stones being identified, I replaced the ureteral access catheter over top of the guidewire, and removed the core.  The guidewire was also removed.  I ran a 6 [place] dual-lumen flexible ureteroscope into the renal pelvis and then inspected the entire calyceal system on the left side.  Multiple small stones were identified.  None seemed to be large enough to need lithotripsy.  Each of the small stones was grasped and extracted through the ureteral access catheter, there being no issue with being caught within the proximal ureter.  Once all stones were removed from the calyceal system, repeat inspection, thoroughly, all calyces and the renal pelvis was performed.  No further stones were seen.  At this point, the ureteroscope was removed.  I then replaced the guidewire through the ureteral access catheter and backloaded through the cystoscope.  I then placed a 24 centimeter by 6 [place] contour double-J stent through the cystoscope, and deployed it within the left ureter.  Following removal of the wire.  Good proximal and distal curls were seen.  Using fluoroscopy and cystoscopy, respectively.  The small stones that had been taken from the ureter were then removed from the bladder with the cystoscope.  The bladder was then drained.  I brought the tether out the urethra.  It was knotted just outside the urethra, trimmed, and placed within her vagina.  The scope was removed and the procedure terminated.  The patient was then awakened  and taken to the PACU in stable condition.  She tolerated the procedure well.

## 2017-08-21 NOTE — H&P (Signed)
H&P  Chief Complaint: idney stone  History of Present Illness: Isabella Buchanan is a 36 y.o. year old female presents for ureteroscopic management of 2 left distal ureteral stones.  Initial presentation last month with feverand significant flank pain.  She was diagnosed with 2 ureteral stones in the distal ureter.  She was subsequently treated with stent and antibiotics.  She presents at this time, having had her infection treated, for definitive ureteroscopic managementof these stones.  Past Medical History:  Diagnosis Date  . Anxiety   . Chronic back pain   . Chronic flank pain   . Chronic pain   . DOE (dyspnea on exertion)   . Fibromyalgia   . GERD (gastroesophageal reflux disease)   . Headache(784.0)    mygrains  . Hiatal hernia   . History of electroencephalogram 11/2013   normal EEG, "psychogenic nonepileptic spell" during EEG  . Hypertension   . IBS (irritable bowel syndrome)   . Interstitial cystitis   . Kidney stones   . Polycystic ovarian syndrome   . PONV (postoperative nausea and vomiting)   . Restless leg   . Seizures (HCC)   . Swelling     Past Surgical History:  Procedure Laterality Date  . CHOLECYSTECTOMY    . CYSTOSCOPY W/ URETERAL STENT PLACEMENT Left 08/06/2017   Procedure: CYSTOSCOPY WITH RETROGRADE PYELOGRAM/URETERAL STENT PLACEMENT;  Surgeon: Marcine Matar, MD;  Location: [facility];  Service: Urology;  Laterality: Left;  . ESOPHAGOGASTRODUODENOSCOPY ENDOSCOPY    . TONSILLECTOMY    . TUBAL LIGATION    . TYMPANOPLASTY      Home Medications:  Medications Prior to Admission  Medication Sig Dispense Refill  . ciprofloxacin (CIPRO) 500 MG tablet Take 1 tablet (500 mg total) by mouth 2 (two) times daily. 1/2 tab  po bid x 7 days 14 tablet 0  . clonazePAM (KLONOPIN) 0.5 MG tablet Take 0.25-0.5 mg by mouth 2 (two) times daily as needed.    Marland Kitchen lisinopril (PRINIVIL,ZESTRIL) 10 MG tablet Take 10 mg by mouth daily.    . ondansetron (ZOFRAN ODT) 4 MG  disintegrating tablet  ODT q4 hours prn nausea/vomit (Patient taking differently: Take 4 mg by mouth every 4 (four) hours as needed for nausea or vomiting. ) 12 tablet 0  . oxybutynin (DITROPAN) 5 MG tablet Take 1 tablet (5 mg total) by mouth every 8 (eight) hours as needed for bladder spasms. 30 tablet 0  . pramipexole (MIRAPEX) 1 MG tablet Take 1 mg by mouth at bedtime.    . traMADol (ULTRAM) 50 MG tablet Take 1 tablet (50 mg total) by mouth every 6 (six) hours as needed. (Patient taking differently: Take 50 mg by mouth every 6 (six) hours as needed for moderate pain. ) 20 tablet 0    Allergies:  Allergies  Allergen Reactions  . Omnicef [Cefdinir] Anaphylaxis    Swelling of tongue  . Amoxicillin Nausea And Vomiting and Rash  . Celebrex [Celecoxib] Nausea And Vomiting  . Codeine Nausea Only    Stomach aches  . Penicillins Nausea And Vomiting and Rash    .Has patient had a PCN reaction causing immediate rash, facial/tongue/throat swelling, SOB or lightheadedness with hypotension: unknown Has patient had a PCN reaction causing severe rash involving mucus membranes or skin necrosis: unknown Has patient had a PCN reaction that required hospitalization: unknown Has patient had a PCN reaction occurring within the last 10 years: no If all of the above answers are "NO", then may proceed with Cephalosporin use.   Marland Kitchen  Sulfa Antibiotics Nausea And Vomiting    Family History  Problem Relation Age of Onset  . Hypertension Father   . Diabetes Father   . Seizures Father   . Cancer Other   . Diabetes Other     Social History:  reports that she has been smoking Cigarettes.  She has a 30.00 pack-year smoking history. She has never used smokeless tobacco. She reports that she drinks alcohol. She reports that she does not use drugs.  ROS: A complete review of systems was performed.  All systems are negative except for pertinent findings as noted.  Physical Exam:  Vital signs in last 24  hours: Temp:  [98.8 F (37.1 C)] 98.8 F (37.1 C) (10/02 1135) Pulse Rate:  [96] 96 (10/02 1135) Resp:  [14-21] 21 (10/02 1205) BP: (113-127)/(76-95) 113/76 (10/02 1205) SpO2:  [95 %-98 %] 95 % (10/02 1205) General:  Alert and oriented, No acute distress HEENT: Normocephalic, atraumatic Neck: No JVD or lymphadenopathy Cardiovascular: Regular rate and rhythm Lungs: Clear bilaterally Abdomen: Soft, nontender, nondistended, no abdominal masses Back: No CVA tenderness Extremities: No edema Neurologic: Grossly intact  Laboratory Data:  No results found for this or any previous visit (from the past 24 hour(s)). Recent Results (from the past 240 hour(s))  Urine culture     Status: Abnormal   Collection Time: 08/11/17 11:36 PM  Result Value Ref Range Status   Specimen Description URINE, CLEAN CATCH  Final   Special Requests NONE  Final   Culture (A)  Final    <10,000 COLONIES/mL INSIGNIFICANT GROWTH Performed at [facility], [address] [place], [place] [address]    Report Status 08/14/2017 FINAL  Final   Creatinine: No results for input(s): CREATININE in the last 168 hours.  Radiologic Imaging: No results found.  Impression/Assessment:  Left distal ureteral stones, with history of infection, status post stenting.  Now presenting for ureteroscopic management  Plan:  cystoscopy, left double-J stent extraction, left ureteroscopy, possible holmium laser lithotripsy and extraction of stones, possible stent replacement.  Chelsea Aus 08/21/2017, 12:15 PM  Bertram Millard. Miakoda Mcmillion MD

## 2017-08-21 NOTE — Transfer of Care (Signed)
Immediate Anesthesia Transfer of Care Note  Patient: Isabella Buchanan  Procedure(s) Performed: CYSTOSCOPY,LEFT URETERAL STENT REMOVAL, LEFT URETEROSCOPY, STONE BASKET EXTRACTION AND LEFT URETERAL STENT PLACEMENT (Left )  Patient Location: PACU  Anesthesia Type:General  Level of Consciousness: awake, alert , oriented and patient cooperative  Airway & Oxygen Therapy: Patient Spontanous Breathing, Patient connected to nasal cannula oxygen and Patient connected to face mask oxygen  Post-op Assessment: Report given to RN and Post -op Vital signs reviewed and stable  Post vital signs: Reviewed and stable  Last Vitals:  Vitals:   08/21/17 1245 08/21/17 1347  BP: 117/77 128/86  Pulse:  96  Resp: 20 (!) 21  Temp:  37 C  SpO2: 96% 94%    Last Pain:  Vitals:   08/21/17 1135  TempSrc: Oral  PainSc: 4          Complications: No apparent anesthesia complications

## 2017-08-21 NOTE — Anesthesia Procedure Notes (Signed)
Procedure Name: Intubation Date/Time: 08/21/2017 1:02 PM Performed by: Raenette Rover Pre-anesthesia Checklist: Patient identified, Emergency Drugs available, Suction available and Patient being monitored Patient Re-evaluated:Patient Re-evaluated prior to induction Oxygen Delivery Method: Circle system utilized Preoxygenation: Pre-oxygenation with 100% oxygen Induction Type: IV induction, Rapid sequence and Cricoid Pressure applied Laryngoscope Size: Mac and 3 Grade View: Grade I Tube type: Oral Tube size: 7.0 mm Number of attempts: 1 Airway Equipment and Method: Stylet Placement Confirmation: positive ETCO2,  ETT inserted through vocal cords under direct vision,  CO2 detector and breath sounds checked- equal and bilateral Secured at: 21 cm Tube secured with: Tape Dental Injury: Teeth and Oropharynx as per pre-operative assessment

## 2017-08-22 ENCOUNTER — Encounter (HOSPITAL_COMMUNITY): Payer: Self-pay | Admitting: Urology

## 2017-09-19 ENCOUNTER — Emergency Department (HOSPITAL_COMMUNITY): Payer: Medicaid Other

## 2017-09-19 ENCOUNTER — Emergency Department (HOSPITAL_COMMUNITY)
Admission: EM | Admit: 2017-09-19 | Discharge: 2017-09-19 | Disposition: A | Discharge status: Home / Self Care | Payer: Medicaid Other | Attending: Emergency Medicine | Admitting: Emergency Medicine

## 2017-09-19 DIAGNOSIS — R109 Unspecified abdominal pain: Secondary | ICD-10-CM | POA: Diagnosis present

## 2017-09-19 DIAGNOSIS — R110 Nausea: Secondary | ICD-10-CM | POA: Diagnosis not present

## 2017-09-19 DIAGNOSIS — F17210 Nicotine dependence, cigarettes, uncomplicated: Secondary | ICD-10-CM | POA: Diagnosis not present

## 2017-09-19 DIAGNOSIS — E860 Dehydration: Secondary | ICD-10-CM | POA: Insufficient documentation

## 2017-09-19 DIAGNOSIS — Z79899 Other long term (current) drug therapy: Secondary | ICD-10-CM | POA: Insufficient documentation

## 2017-09-19 DIAGNOSIS — R000 Tachycardia, unspecified: Secondary | ICD-10-CM | POA: Insufficient documentation

## 2017-09-19 DIAGNOSIS — I10 Essential (primary) hypertension: Secondary | ICD-10-CM | POA: Insufficient documentation

## 2017-09-19 LAB — URINALYSIS, ROUTINE W REFLEX MICROSCOPIC
Bilirubin Urine: NEGATIVE
GLUCOSE, UA: NEGATIVE mg/dL
HGB URINE DIPSTICK: NEGATIVE
KETONES UR: NEGATIVE mg/dL
NITRITE: NEGATIVE
PROTEIN: 30 mg/dL — AB
Specific Gravity, Urine: 1.039 — ABNORMAL HIGH (ref 1.005–1.030)
pH: 5 (ref 5.0–8.0)

## 2017-09-19 LAB — COMPREHENSIVE METABOLIC PANEL
ALT: 13 U/L — ABNORMAL LOW (ref 14–54)
ANION GAP: 9 (ref 5–15)
AST: 23 U/L (ref 15–41)
Albumin: 3.8 g/dL (ref 3.5–5.0)
Alkaline Phosphatase: 65 U/L (ref 38–126)
BILIRUBIN TOTAL: 0.3 mg/dL (ref 0.3–1.2)
BUN: 19 mg/dL (ref 6–20)
CO2: 19 mmol/L — ABNORMAL LOW (ref 22–32)
Calcium: 8.8 mg/dL — ABNORMAL LOW (ref 8.9–10.3)
Chloride: 101 mmol/L (ref 101–111)
Creatinine, Ser: 0.89 mg/dL (ref 0.44–1.00)
Glucose, Bld: 100 mg/dL — ABNORMAL HIGH (ref 65–99)
POTASSIUM: 4.2 mmol/L (ref 3.5–5.1)
Sodium: 129 mmol/L — ABNORMAL LOW (ref 135–145)
TOTAL PROTEIN: 7.1 g/dL (ref 6.5–8.1)

## 2017-09-19 LAB — CBC WITH DIFFERENTIAL/PLATELET
BASOS ABS: 0.1 10*3/uL (ref 0.0–0.1)
Basophils Relative: 1 %
Eosinophils Absolute: 0.4 10*3/uL (ref 0.0–0.7)
Eosinophils Relative: 3 %
HEMATOCRIT: 42.1 % (ref 36.0–46.0)
Hemoglobin: 13.9 g/dL (ref 12.0–15.0)
LYMPHS PCT: 29 %
Lymphs Abs: 4.4 10*3/uL — ABNORMAL HIGH (ref 0.7–4.0)
MCH: 30.6 pg (ref 26.0–34.0)
MCHC: 33 g/dL (ref 30.0–36.0)
MCV: 92.7 fL (ref 78.0–100.0)
Monocytes Absolute: 1 10*3/uL (ref 0.1–1.0)
Monocytes Relative: 7 %
NEUTROS ABS: 9.4 10*3/uL — AB (ref 1.7–7.7)
Neutrophils Relative %: 60 %
Platelets: 303 10*3/uL (ref 150–400)
RBC: 4.54 MIL/uL (ref 3.87–5.11)
RDW: 14 % (ref 11.5–15.5)
WBC: 15.2 10*3/uL — AB (ref 4.0–10.5)

## 2017-09-19 LAB — PREGNANCY, URINE: Preg Test, Ur: NEGATIVE

## 2017-09-19 MED ORDER — SODIUM CHLORIDE 0.9 % IV BOLUS (SEPSIS)
1000.0000 mL | Freq: Once | INTRAVENOUS | Status: AC
  Administered 2017-09-19: 1000 mL via INTRAVENOUS

## 2017-09-19 MED ORDER — ONDANSETRON HCL 4 MG/2ML IJ SOLN
4.0000 mg | Freq: Once | INTRAMUSCULAR | Status: AC
  Administered 2017-09-19: 4 mg via INTRAVENOUS
  Filled 2017-09-19: qty 2

## 2017-09-19 MED ORDER — KETOROLAC TROMETHAMINE 30 MG/ML IJ SOLN
15.0000 mg | Freq: Once | INTRAMUSCULAR | Status: AC
  Administered 2017-09-19: 15 mg via INTRAVENOUS
  Filled 2017-09-19: qty 1

## 2017-09-19 MED ORDER — MORPHINE SULFATE (PF) 4 MG/ML IV SOLN
4.0000 mg | Freq: Once | INTRAVENOUS | Status: AC
  Administered 2017-09-19: 4 mg via INTRAVENOUS
  Filled 2017-09-19: qty 1

## 2017-09-19 NOTE — Discharge Instructions (Signed)
The cause of your side pain was not identified today.  Get rechecked immediately if you develop severe pain, high fevers, difficulty breathing or any new concerning symptoms.  You were dehydrated today with a low sodium.  Please follow-up with your doctor in the next week to have this rechecked.

## 2017-09-19 NOTE — ED Triage Notes (Signed)
Pt states she is having left flank pain. Pt states this pain started at 6pm and is a 7 on a scale of 0 to 10. Pt states she had a kidney infection early in October 2018 and had surgery to removed the kidney stones. Stents were placed.

## 2017-09-19 NOTE — ED Provider Notes (Signed)
[facility] EMERGENCY DEPARTMENT Provider Note   CSN: [id number] Arrival date & time: 09/19/17  1918     History   Chief Complaint Chief Complaint  Patient presents with  . Flank Pain    HPI Isabella Buchanan is a 36 y.o. female.  The history is provided by the patient. No language interpreter was used.  Flank Pain    Isabella Buchanan is a 36 y.o. female who presents to the Emergency Department complaining of flank pain.  She reports sudden onset left flank pain that started several hours prior to ED arrival.  Pain is described as sharp and constant in nature.  She reports associated nausea.  Pain is similar to prior episodes of renal colic.  She has a history of lithotripsy and stent placement as well as kidney stone with infection.  Her last stent placement was October 2 and she pulled the stent the Thursday after the procedure.  No reports of fevers.   Past Medical History:  Diagnosis Date  . Anxiety   . Chronic back pain   . Chronic flank pain   . Chronic pain   . DOE (dyspnea on exertion)   . Fibromyalgia   . GERD (gastroesophageal reflux disease)   . Headache(784.0)    mygrains  . Hiatal hernia   . History of electroencephalogram 11/2013   normal EEG, "psychogenic nonepileptic spell" during EEG  . Hypertension   . IBS (irritable bowel syndrome)   . Interstitial cystitis   . Kidney stones   . Polycystic ovarian syndrome   . PONV (postoperative nausea and vomiting)   . Restless leg   . Seizures (HCC)   . Swelling     Patient Active Problem List   Diagnosis Date Noted  . Ureteral calculus, left 08/06/2017  . Morbid obesity (HCC) 08/02/2016  . Hypertriglyceridemia without hypercholesterolemia 08/02/2016  . Tobacco abuse 08/02/2016  . Essential hypertriglyceridemia 06/30/2015  . Combined fat and carbohydrate induced hyperlipemia 06/30/2015  . Nicotine addiction 06/28/2015  . Cyclic vomiting syndrome 06/28/2015  . Avitaminosis D 06/28/2015  . Essential  hypertension 05/04/2015  . Degeneration of intervertebral disc of lumbar region 02/05/2015  . Anxiety, generalized 02/05/2015  . Community acquired pneumonia 07/17/2014  . CAP (community acquired pneumonia) 07/17/2014  . Sepsis (HCC) 07/17/2014  . Encephalopathy 07/17/2014  . Acute respiratory failure (HCC) 07/17/2014  . Seizure (HCC) 12/13/2013  . Seizures (HCC) 12/13/2013  . History of IBS 12/13/2013  . Fibromyalgia   . Chronic back pain   . Interstitial cystitis   . Polycystic ovarian syndrome     Past Surgical History:  Procedure Laterality Date  . CHOLECYSTECTOMY    . CYSTOSCOPY W/ URETERAL STENT PLACEMENT Left 08/06/2017   Procedure: CYSTOSCOPY WITH RETROGRADE PYELOGRAM/URETERAL STENT PLACEMENT;  Surgeon: Marcine Matarahlstedt, Stephen, MD;  Location: [facility];  Service: Urology;  Laterality: Left;  . CYSTOSCOPY/URETEROSCOPY/HOLMIUM LASER/STENT PLACEMENT Left 08/21/2017   Procedure: CYSTOSCOPY,LEFT URETERAL STENT REMOVAL, LEFT URETEROSCOPY, STONE BASKET EXTRACTION AND LEFT URETERAL STENT PLACEMENT;  Surgeon: Marcine Matarahlstedt, Stephen, MD;  Location: [facility];  Service: Urology;  Laterality: Left;  . ESOPHAGOGASTRODUODENOSCOPY ENDOSCOPY    . TONSILLECTOMY    . TUBAL LIGATION    . TYMPANOPLASTY      OB History    Gravida Para Term Preterm AB Living   2 2   2   2    SAB TAB Ectopic Multiple Live Births  Home Medications    Prior to Admission medications   Medication Sig Start Date End Date Taking? Authorizing Provider  clonazePAM (KLONOPIN) 0.5 MG tablet Take 0.25-0.5 mg by mouth 2 (two) times daily as needed. 08/08/17  Yes [provider]  lisinopril (PRINIVIL,ZESTRIL) 10 MG tablet Take 20 mg by mouth daily.  08/08/17 02/04/18 Yes [provider]  oxybutynin (DITROPAN) 5 MG tablet Take 1 tablet (5 mg total) by mouth every 8 (eight) hours as needed for bladder spasms. 08/07/17  Yes Dahlstedt, Jeannett Senior, MD  pramipexole (MIRAPEX) 1 MG tablet Take 1 mg by mouth at  bedtime.   Yes [provider]  atorvastatin (LIPITOR) 20 MG tablet Take 20 mg by mouth daily.  09/18/17 09/18/18  [provider]  ciprofloxacin (CIPRO) 500 MG tablet Take 1 tablet (500 mg total) by mouth 2 (two) times daily. 1/2 tab  po bid x 7 days Patient not taking: Reported on 09/19/2017 08/07/17   Marcine Matar, MD  ondansetron (ZOFRAN ODT) 4 MG disintegrating tablet 4mg  ODT q4 hours prn nausea/vomit Patient not taking: Reported on 09/19/2017 08/05/17   Bethann Berkshire, MD    Family History Family History  Problem Relation Age of Onset  . Hypertension Father   . Diabetes Father   . Seizures Father   . Cancer Other   . Diabetes Other     Social History Social History  Substance Use Topics  . Smoking status: Current Every Day Smoker    Packs/day: 2.00    Years: 15.00    Types: Cigarettes  . Smokeless tobacco: Never Used  . Alcohol use Yes     Comment: rarely     Allergies   Omnicef [cefdinir]; Amoxicillin; Celebrex [celecoxib]; Codeine; Penicillins; and Sulfa antibiotics   Review of Systems Review of Systems  Genitourinary: Positive for flank pain.  All other systems reviewed and are negative.    Physical Exam Updated Vital Signs BP 109/73   Pulse 78   Temp 98.8 F (37.1 C) (Oral)   Resp 20   Ht 5\' 6"  (1.676 m)   Wt 91.2 kg (201 lb)   SpO2 98%   BMI 32.44 kg/m   Physical Exam  Constitutional: She is oriented to person, place, and time. She appears well-developed and well-nourished.  HENT:  Head: Normocephalic and atraumatic.  Cardiovascular: Regular rhythm.   No murmur heard. tachycardic  Pulmonary/Chest: Effort normal and breath sounds normal. No respiratory distress.  Abdominal: Soft. There is no tenderness. There is no rebound and no guarding.  Left cva tenderness  Musculoskeletal: She exhibits no edema or tenderness.  Neurological: She is alert and oriented to person, place, and time.  Skin: Skin is warm and dry.    Psychiatric: She has a normal mood and affect. Her behavior is normal.  Nursing note and vitals reviewed.    ED Treatments / Results  Labs (all labs ordered are listed, but only abnormal results are displayed) Labs Reviewed  URINALYSIS, ROUTINE W REFLEX MICROSCOPIC - Abnormal; Notable for the following:       Result Value   APPearance CLOUDY (*)    Specific Gravity, Urine 1.039 (*)    Protein, ur 30 (*)    Leukocytes, UA MODERATE (*)    Bacteria, UA RARE (*)    Squamous Epithelial / LPF 6-30 (*)    All other components within normal limits  COMPREHENSIVE METABOLIC PANEL - Abnormal; Notable for the following:    Sodium 129 (*)    CO2 19 (*)  Glucose, Bld 100 (*)    Calcium 8.8 (*)    ALT 13 (*)    All other components within normal limits  CBC WITH DIFFERENTIAL/PLATELET - Abnormal; Notable for the following:    WBC 15.2 (*)    Neutro Abs 9.4 (*)    Lymphs Abs 4.4 (*)    All other components within normal limits  URINE CULTURE  PREGNANCY, URINE    EKG  EKG Interpretation None       Radiology Ct Renal Stone Study  Result Date: 09/19/2017 CLINICAL DATA:  36 y/o  F; left flank pain. EXAM: CT ABDOMEN AND PELVIS WITHOUT CONTRAST TECHNIQUE: Multidetector CT imaging of the abdomen and pelvis was performed following the standard protocol without IV contrast. COMPARISON:  08/06/2017 CT abdomen and pelvis. FINDINGS: Lower chest: No acute abnormality. Hepatobiliary: Hepatic steatosis. No focal liver abnormality is seen. No gallstones, gallbladder wall thickening, or biliary dilatation. Pancreas: Unremarkable. No pancreatic ductal dilatation or surrounding inflammatory changes. Spleen: Normal in size without focal abnormality. Adrenals/Urinary Tract: Normal adrenal glands. Bilateral nephrolithiasis. No hydronephrosis or obstructing ureter stone identified. Normal bladder. Stomach/Bowel: Stomach is within normal limits. Appendix appears normal. No evidence of bowel wall thickening,  distention, or inflammatory changes. Vascular/Lymphatic: No significant vascular findings are present. No enlarged abdominal or pelvic lymph nodes. Reproductive: Uterus and bilateral adnexa are unremarkable. Small volume of fluid within the cul-de-sac. Other: No abdominal wall hernia or abnormality. No abdominopelvic ascites. Musculoskeletal: No acute or significant osseous findings. IMPRESSION: 1. Bilateral nonobstructive nephrolithiasis. 2. No hydronephrosis or ureter stone. 3. Hepatic steatosis. Electronically Signed   By: Mitzi Hansen M.D.   On: 09/19/2017 23:00    Procedures Procedures (including critical care time)  Medications Ordered in ED Medications  sodium chloride 0.9 % bolus 1,000 mL (0 mLs Intravenous Stopped 09/19/17 2353)  morphine 4 MG/ML injection 4 mg (4 mg Intravenous Given 09/19/17 2108)  ondansetron (ZOFRAN) injection 4 mg (4 mg Intravenous Given 09/19/17 2109)  ketorolac (TORADOL) 30 MG/ML injection 15 mg (15 mg Intravenous Given 09/19/17 2351)     Initial Impression / Assessment and Plan / ED Course  I have reviewed the triage vital signs and the nursing notes.  Pertinent labs & imaging results that were available during my care of the patient were reviewed by me and considered in my medical decision making (see chart for details).     Pt with hx/o kidney stones, UTI here with left flank pain similar to prior kidney stones.  UA with epithelials - difficult to tell if UTI.  Given lack of nitrates, no fever, no dysuria will send cultures and only treat if cultures are positive.  CT renal stone obtained that was negative for obstructive stone.  Presentation is not c/w pna, PE, sepsis.  Labs c/w dehydration with mild hyponatremia.  D/w pt home care for flank pain, dehydration.  Discussed outpatient follow up and return precautions.    Final Clinical Impressions(s) / ED Diagnoses   Final diagnoses:  Left flank pain  Dehydration    New  Prescriptions Discharge Medication List as of 09/19/2017 11:38 PM       Tilden Fossa, MD 09/20/17 [phone]

## 2017-09-21 LAB — URINE CULTURE: CULTURE: NO GROWTH

## 2017-10-12 ENCOUNTER — Emergency Department (HOSPITAL_COMMUNITY): Payer: Medicaid Other

## 2017-10-12 ENCOUNTER — Encounter (HOSPITAL_COMMUNITY): Payer: Self-pay | Admitting: Emergency Medicine

## 2017-10-12 ENCOUNTER — Emergency Department (HOSPITAL_COMMUNITY)
Admission: EM | Admit: 2017-10-12 | Discharge: 2017-10-12 | Disposition: A | Discharge status: Home / Self Care | Payer: Medicaid Other | Attending: Emergency Medicine | Admitting: Emergency Medicine

## 2017-10-12 DIAGNOSIS — R197 Diarrhea, unspecified: Secondary | ICD-10-CM | POA: Insufficient documentation

## 2017-10-12 DIAGNOSIS — R05 Cough: Secondary | ICD-10-CM | POA: Diagnosis not present

## 2017-10-12 DIAGNOSIS — R1110 Vomiting, unspecified: Secondary | ICD-10-CM | POA: Diagnosis not present

## 2017-10-12 DIAGNOSIS — F17210 Nicotine dependence, cigarettes, uncomplicated: Secondary | ICD-10-CM | POA: Insufficient documentation

## 2017-10-12 DIAGNOSIS — J111 Influenza due to unidentified influenza virus with other respiratory manifestations: Secondary | ICD-10-CM

## 2017-10-12 DIAGNOSIS — Z79899 Other long term (current) drug therapy: Secondary | ICD-10-CM | POA: Insufficient documentation

## 2017-10-12 DIAGNOSIS — I10 Essential (primary) hypertension: Secondary | ICD-10-CM | POA: Diagnosis not present

## 2017-10-12 DIAGNOSIS — R509 Fever, unspecified: Secondary | ICD-10-CM | POA: Diagnosis not present

## 2017-10-12 DIAGNOSIS — R52 Pain, unspecified: Secondary | ICD-10-CM | POA: Diagnosis not present

## 2017-10-12 DIAGNOSIS — R69 Illness, unspecified: Secondary | ICD-10-CM

## 2017-10-12 LAB — CBC WITH DIFFERENTIAL/PLATELET
Basophils Absolute: 0.1 10*3/uL (ref 0.0–0.1)
Basophils Relative: 1 %
EOS ABS: 0.5 10*3/uL (ref 0.0–0.7)
EOS PCT: 4 %
HEMATOCRIT: 43 % (ref 36.0–46.0)
Hemoglobin: 13.8 g/dL (ref 12.0–15.0)
LYMPHS ABS: 3.5 10*3/uL (ref 0.7–4.0)
Lymphocytes Relative: 27 %
MCH: 30.5 pg (ref 26.0–34.0)
MCHC: 32.1 g/dL (ref 30.0–36.0)
MCV: 95.1 fL (ref 78.0–100.0)
Monocytes Absolute: 1.2 10*3/uL — ABNORMAL HIGH (ref 0.1–1.0)
Monocytes Relative: 9 %
Neutro Abs: 7.9 10*3/uL — ABNORMAL HIGH (ref 1.7–7.7)
Neutrophils Relative %: 59 %
Platelets: 264 10*3/uL (ref 150–400)
RBC: 4.52 MIL/uL (ref 3.87–5.11)
RDW: 14.3 % (ref 11.5–15.5)
WBC: 13.2 10*3/uL — AB (ref 4.0–10.5)

## 2017-10-12 LAB — COMPREHENSIVE METABOLIC PANEL
ALT: 20 U/L (ref 14–54)
ANION GAP: 12 (ref 5–15)
AST: 33 U/L (ref 15–41)
Albumin: 3.7 g/dL (ref 3.5–5.0)
Alkaline Phosphatase: 68 U/L (ref 38–126)
BILIRUBIN TOTAL: 0.7 mg/dL (ref 0.3–1.2)
BUN: 20 mg/dL (ref 6–20)
CALCIUM: 9.2 mg/dL (ref 8.9–10.3)
CO2: 19 mmol/L — ABNORMAL LOW (ref 22–32)
Chloride: 102 mmol/L (ref 101–111)
Creatinine, Ser: 1.08 mg/dL — ABNORMAL HIGH (ref 0.44–1.00)
GFR calc Af Amer: >60 mL/min (ref >60)
Glucose, Bld: 134 mg/dL — ABNORMAL HIGH (ref 65–99)
POTASSIUM: 4.3 mmol/L (ref 3.5–5.1)
Sodium: 133 mmol/L — ABNORMAL LOW (ref 135–145)
TOTAL PROTEIN: 6.9 g/dL (ref 6.5–8.1)

## 2017-10-12 MED ORDER — BENZONATATE 100 MG PO CAPS: 100.0000 mg | ORAL_CAPSULE | Freq: Three times a day (TID) | ORAL | 0 refills | Status: DC | PRN

## 2017-10-12 MED ORDER — ONDANSETRON HCL 4 MG PO TABS: 4.0000 mg | ORAL_TABLET | Freq: Four times a day (QID) | ORAL | 0 refills | Status: AC | PRN

## 2017-10-12 MED ORDER — ONDANSETRON HCL 4 MG/2ML IJ SOLN
4.0000 mg | Freq: Once | INTRAMUSCULAR | Status: AC
  Administered 2017-10-12: 4 mg via INTRAVENOUS
  Filled 2017-10-12: qty 2

## 2017-10-12 MED ORDER — DOXYCYCLINE HYCLATE 100 MG PO TABS: 100.0000 mg | ORAL_TABLET | Freq: Once | ORAL | Status: DC

## 2017-10-12 MED ORDER — SODIUM CHLORIDE 0.9 % IV BOLUS (SEPSIS)
1000.0000 mL | Freq: Once | INTRAVENOUS | Status: AC
  Administered 2017-10-12: 1000 mL via INTRAVENOUS

## 2017-10-12 NOTE — ED Triage Notes (Addendum)
Patient complaining of cough x 2-3 weeks and fever starting today. States "I cough so hard I vomit." States she took tylenol and ibuprofen at 0630 today.

## 2017-10-12 NOTE — Discharge Instructions (Signed)
You have a flu like illness. This will take 1-2 weeks to get better. Drink plenty of fluids, and get rest. Return without fail for worsening symptoms, including persistent fever > 5-6 days, difficulty breathing, passing out, intractable vomiting, confusion, or any other symptoms concerning to you.

## 2017-10-12 NOTE — ED Provider Notes (Signed)
[facility] EMERGENCY DEPARTMENT Provider Note   CSN: [id number] Arrival date & time: 10/12/17  1478     History   Chief Complaint Chief Complaint  Patient presents with  . Cough    HPI Isabella Buchanan is a 36 y.o. female.  The history is provided by the patient.  Cough  This is a new problem. The current episode started more than 1 week ago. The problem occurs constantly. The problem has been gradually worsening. The cough is productive of sputum. The maximum temperature recorded prior to her arrival was 102 to 102.9 F. Associated symptoms include rhinorrhea and myalgias. Pertinent negatives include no sore throat. She has tried nothing for the symptoms.   36 year old female who presents with cough, body aches, vomiting and diarrhea.  She reports 2 weeks of mild cough, congestion and runny nose.  Over the past 1-2 days she has developed more productive cough, with fever of 102 Fahrenheit just prior to arrival.  States that she has post tussive emesis at times with diarrhea that is nonbloody.  She did take Tylenol and ibuprofen at 6:30 AM today. Has associated body aches and chills.  Denies any difficulty urinating, dysuria or urinary frequency.  Denies abdominal pain, difficulty breathing. States that she has been around children that have been sick with upper respiratory viruses.  Patient with history of chronic pain, IBS, interstitial cystitis, PCO S, and nephrolithiasis.   Past Medical History:  Diagnosis Date  . Anxiety   . Chronic back pain   . Chronic flank pain   . Chronic pain   . DOE (dyspnea on exertion)   . Fibromyalgia   . GERD (gastroesophageal reflux disease)   . Headache(784.0)    mygrains  . Hiatal hernia   . History of electroencephalogram 11/2013   normal EEG, "psychogenic nonepileptic spell" during EEG  . Hypertension   . IBS (irritable bowel syndrome)   . Interstitial cystitis   . Kidney stones   . Polycystic ovarian syndrome   . PONV  (postoperative nausea and vomiting)   . Restless leg   . Seizures (HCC)   . Swelling     Patient Active Problem List   Diagnosis Date Noted  . Ureteral calculus, left 08/06/2017  . Morbid obesity (HCC) 08/02/2016  . Hypertriglyceridemia without hypercholesterolemia 08/02/2016  . Tobacco abuse 08/02/2016  . Essential hypertriglyceridemia 06/30/2015  . Combined fat and carbohydrate induced hyperlipemia 06/30/2015  . Nicotine addiction 06/28/2015  . Cyclic vomiting syndrome 06/28/2015  . Avitaminosis D 06/28/2015  . Essential hypertension 05/04/2015  . Degeneration of intervertebral disc of lumbar region 02/05/2015  . Anxiety, generalized 02/05/2015  . Community acquired pneumonia 07/17/2014  . CAP (community acquired pneumonia) 07/17/2014  . Sepsis (HCC) 07/17/2014  . Encephalopathy 07/17/2014  . Acute respiratory failure (HCC) 07/17/2014  . Seizure (HCC) 12/13/2013  . Seizures (HCC) 12/13/2013  . History of IBS 12/13/2013  . Fibromyalgia   . Chronic back pain   . Interstitial cystitis   . Polycystic ovarian syndrome     Past Surgical History:  Procedure Laterality Date  . CHOLECYSTECTOMY    . CYSTOSCOPY W/ URETERAL STENT PLACEMENT Left 08/06/2017   Procedure: CYSTOSCOPY WITH RETROGRADE PYELOGRAM/URETERAL STENT PLACEMENT;  Surgeon: Marcine Matar, MD;  Location: [facility];  Service: Urology;  Laterality: Left;  . CYSTOSCOPY/URETEROSCOPY/HOLMIUM LASER/STENT PLACEMENT Left 08/21/2017   Procedure: CYSTOSCOPY,LEFT URETERAL STENT REMOVAL, LEFT URETEROSCOPY, STONE BASKET EXTRACTION AND LEFT URETERAL STENT PLACEMENT;  Surgeon: Marcine Matar, MD;  Location: [facility];  Service:  Urology;  Laterality: Left;  . ESOPHAGOGASTRODUODENOSCOPY ENDOSCOPY    . TONSILLECTOMY    . TUBAL LIGATION    . TYMPANOPLASTY      OB History    Gravida Para Term Preterm AB Living   2 2   2   2    SAB TAB Ectopic Multiple Live Births                   Home Medications    Prior to Admission  medications   Medication Sig Start Date End Date Taking? Authorizing Provider  atorvastatin (LIPITOR) 20 MG tablet Take 20 mg by mouth daily.  09/18/17 09/18/18  [provider]  benzonatate (TESSALON) 100 MG capsule Take 1 capsule (100 mg total) by mouth 3 (three) times daily as needed for cough. 10/12/17   Lavera GuiseLiu, Bich Mchaney Duo, MD  ciprofloxacin (CIPRO) 500 MG tablet Take 1 tablet (500 mg total) by mouth 2 (two) times daily. 1/2 tab  po bid x 7 days Patient not taking: Reported on 09/19/2017 08/07/17   Marcine Matarahlstedt, Stephen, MD  clonazePAM (KLONOPIN) 0.5 MG tablet Take 0.25-0.5 mg by mouth 2 (two) times daily as needed. 08/08/17   [provider]  lisinopril (PRINIVIL,ZESTRIL) 10 MG tablet Take 20 mg by mouth daily.  08/08/17 02/04/18  [provider]  ondansetron (ZOFRAN ODT) 4 MG disintegrating tablet 4mg  ODT q4 hours prn nausea/vomit Patient not taking: Reported on 09/19/2017 08/05/17   Bethann BerkshireZammit, Joseph, MD  ondansetron (ZOFRAN) 4 MG tablet Take 1 tablet (4 mg total) by mouth every 6 (six) hours as needed for nausea or vomiting. 10/12/17   Lavera GuiseLiu, Shjon Lizarraga Duo, MD  oxybutynin (DITROPAN) 5 MG tablet Take 1 tablet (5 mg total) by mouth every 8 (eight) hours as needed for bladder spasms. 08/07/17   Marcine Matarahlstedt, Stephen, MD  pramipexole (MIRAPEX) 1 MG tablet Take 1 mg by mouth at bedtime.    [provider]    Family History Family History  Problem Relation Age of Onset  . Hypertension Father   . Diabetes Father   . Seizures Father   . Cancer Other   . Diabetes Other     Social History Social History   Tobacco Use  . Smoking status: Current Every Day Smoker    Packs/day: 2.00    Years: 15.00    Pack years: 30.00    Types: Cigarettes  . Smokeless tobacco: Never Used  Substance Use Topics  . Alcohol use: Yes    Comment: rarely  . Drug use: No     Allergies   Omnicef [cefdinir]; Amoxicillin; Celebrex [celecoxib]; Codeine; Penicillins; and Sulfa  antibiotics   Review of Systems Review of Systems  Constitutional: Positive for fatigue and fever.  HENT: Positive for rhinorrhea. Negative for sore throat.   Respiratory: Positive for cough.   Musculoskeletal: Positive for myalgias.  All other systems reviewed and are negative.    Physical Exam Updated Vital Signs BP 128/85   Pulse 94   Temp 97.8 F (36.6 C) (Oral)   Resp 20   Ht 5\' 6"  (1.676 m)   Wt 91.2 kg (201 lb)   LMP 10/04/2017   SpO2 100%   BMI 32.44 kg/m   Physical Exam Physical Exam  Nursing note and vitals reviewed. Constitutional: Well developed, well nourished, non-toxic, and in no acute distress Head: Normocephalic and atraumatic.  Mouth/Throat: Oropharynx is clear and dry mucous membranes.  Neck: Normal range of motion. Neck supple.  Cardiovascular: Normal rate and regular rhythm.  Pulmonary/Chest: Effort normal and breath sounds normal.  Abdominal: Soft. There is no tenderness. There is no rebound and no guarding.  Musculoskeletal: Normal range of motion.  Neurological: Alert, no facial droop, fluent speech, moves all extremities symmetrically Skin: Skin is warm and dry.  Psychiatric: Cooperative   ED Treatments / Results  Labs (all labs ordered are listed, but only abnormal results are displayed) Labs Reviewed  CBC WITH DIFFERENTIAL/PLATELET - Abnormal; Notable for the following components:      Result Value   WBC 13.2 (*)    Neutro Abs 7.9 (*)    Monocytes Absolute 1.2 (*)    All other components within normal limits  COMPREHENSIVE METABOLIC PANEL - Abnormal; Notable for the following components:   Sodium 133 (*)    CO2 19 (*)    Glucose, Bld 134 (*)    Creatinine, Ser 1.08 (*)    All other components within normal limits    EKG  EKG Interpretation None       Radiology Dg Chest 2 View  Result Date: 10/12/2017 CLINICAL DATA:  Cough for the past 3 weeks.  Fever this morning. EXAM: CHEST  2 VIEW COMPARISON:  Chest x-ray dated  July 27, 2014. FINDINGS: The cardiomediastinal silhouette remains borderline enlarged. Normal pulmonary vascularity. Right greater than left basilar subsegmental atelectasis. No focal consolidation, pleural effusion, or pneumothorax. No acute osseous abnormality. IMPRESSION: Bibasilar atelectasis.  No active cardiopulmonary disease. Electronically Signed   By: Obie DredgeWilliam T Derry M.D.   On: 10/12/2017 08:51    Procedures Procedures (including critical care time)  Medications Ordered in ED Medications  ondansetron (ZOFRAN) injection 4 mg (not administered)  sodium chloride 0.9 % bolus 1,000 mL (1,000 mLs Intravenous New Bag/Given 10/12/17 [id number])     Initial Impression / Assessment and Plan / ED Course  I have reviewed the triage vital signs and the nursing notes.  Pertinent labs & imaging results that were available during my care of the patient were reviewed by me and considered in my medical decision making (see chart for details).     Presenting with productive cough, fever, body aches, vomiting and diarrhea.  Presentation seems consistent with likely flulike illness.  She is nontoxic in appearance, but does appear mildly dry.  Her vital signs are stable non-concerning.  Chest x-ray visualized and shows no evidence of infiltrate, edema or other acute cardiopulmonary processes.  Blood work suggestive of mild dehydration and she is given 1 L of IV fluids and antibiotics.  Remainder of blood work non-concerning.  I discussed supportive care instructions for likely flulike illness.  She is felt stable for outpatient management. Strict return and follow-up instructions reviewed. She expressed understanding of all discharge instructions and felt comfortable with the plan of care.   Final Clinical Impressions(s) / ED Diagnoses   Final diagnoses:  Influenza-like illness    ED Discharge Orders        Ordered    benzonatate (TESSALON) 100 MG capsule  3 times daily PRN     10/12/17 0932     ondansetron (ZOFRAN) 4 MG tablet  Every 6 hours PRN     10/12/17 0932       Lavera GuiseLiu, Huntley Knoop Duo, MD 10/12/17 [phone]

## 2017-10-13 ENCOUNTER — Emergency Department (HOSPITAL_COMMUNITY)
Admission: EM | Admit: 2017-10-13 | Discharge: 2017-10-13 | Disposition: A | Discharge status: Home / Self Care | Payer: Medicaid Other | Attending: Emergency Medicine | Admitting: Emergency Medicine

## 2017-10-13 ENCOUNTER — Other Ambulatory Visit: Payer: Self-pay

## 2017-10-13 ENCOUNTER — Encounter (HOSPITAL_COMMUNITY): Payer: Self-pay | Admitting: Emergency Medicine

## 2017-10-13 DIAGNOSIS — I10 Essential (primary) hypertension: Secondary | ICD-10-CM | POA: Insufficient documentation

## 2017-10-13 DIAGNOSIS — F17210 Nicotine dependence, cigarettes, uncomplicated: Secondary | ICD-10-CM | POA: Insufficient documentation

## 2017-10-13 DIAGNOSIS — R05 Cough: Secondary | ICD-10-CM | POA: Diagnosis present

## 2017-10-13 DIAGNOSIS — Z79899 Other long term (current) drug therapy: Secondary | ICD-10-CM | POA: Insufficient documentation

## 2017-10-13 DIAGNOSIS — R69 Illness, unspecified: Secondary | ICD-10-CM | POA: Insufficient documentation

## 2017-10-13 DIAGNOSIS — J111 Influenza due to unidentified influenza virus with other respiratory manifestations: Secondary | ICD-10-CM

## 2017-10-13 MED ORDER — PROMETHAZINE-DM 6.25-15 MG/5ML PO SYRP: 5.0000 mL | ORAL_SOLUTION | Freq: Four times a day (QID) | ORAL | 0 refills | Status: DC | PRN

## 2017-10-13 NOTE — ED Triage Notes (Signed)
Cough, congestion, runny nose.  Seen yesterday for same.  Was given tessalon pearls.  Reports they have not helped her stop coughing.  States she was up all night coughing.

## 2017-10-13 NOTE — Discharge Instructions (Signed)
Drink plenty of water.  Take Tylenol or ibuprofen every 4-6 hours as needed for body aches or fever.  Follow-up with your primary provider for recheck if needed.

## 2017-10-13 NOTE — ED Provider Notes (Signed)
[facility] EMERGENCY DEPARTMENT Provider Note   CSN: [id number] Arrival date & time: 10/13/17  1945     History   Chief Complaint Chief Complaint  Patient presents with  . Cough    HPI Isabella Buchanan is a 36 y.o. female.  HPI   Isabella Buchanan is a 36 y.o. female who presents to the Emergency Department complaining of continued non-productive cough, nasal congestion and rhinorrhea.  Seen here yesterday for same.  She was given a prescription for [employer] and Zofran which she states is not helping control her cough.  States cough is mostly at night and keeping her from sleeping.  She denies fever, shortness of breath, chest pain, and posttussive emesis.  She has not tried any over-the-counter medications for symptom relief   Past Medical History:  Diagnosis Date  . Anxiety   . Chronic back pain   . Chronic flank pain   . Chronic pain   . DOE (dyspnea on exertion)   . Fibromyalgia   . GERD (gastroesophageal reflux disease)   . Headache(784.0)    mygrains  . Hiatal hernia   . History of electroencephalogram 11/2013   normal EEG, "psychogenic nonepileptic spell" during EEG  . Hypertension   . IBS (irritable bowel syndrome)   . Interstitial cystitis   . Kidney stones   . Polycystic ovarian syndrome   . PONV (postoperative nausea and vomiting)   . Restless leg   . Seizures (HCC)   . Swelling     Patient Active Problem List   Diagnosis Date Noted  . Ureteral calculus, left 08/06/2017  . Morbid obesity (HCC) 08/02/2016  . Hypertriglyceridemia without hypercholesterolemia 08/02/2016  . Tobacco abuse 08/02/2016  . Essential hypertriglyceridemia 06/30/2015  . Combined fat and carbohydrate induced hyperlipemia 06/30/2015  . Nicotine addiction 06/28/2015  . Cyclic vomiting syndrome 06/28/2015  . Avitaminosis D 06/28/2015  . Essential hypertension 05/04/2015  . Degeneration of intervertebral disc of lumbar region 02/05/2015  . Anxiety, generalized 02/05/2015   . Community acquired pneumonia 07/17/2014  . CAP (community acquired pneumonia) 07/17/2014  . Sepsis (HCC) 07/17/2014  . Encephalopathy 07/17/2014  . Acute respiratory failure (HCC) 07/17/2014  . Seizure (HCC) 12/13/2013  . Seizures (HCC) 12/13/2013  . History of IBS 12/13/2013  . Fibromyalgia   . Chronic back pain   . Interstitial cystitis   . Polycystic ovarian syndrome     Past Surgical History:  Procedure Laterality Date  . CHOLECYSTECTOMY    . CYSTOSCOPY W/ URETERAL STENT PLACEMENT Left 08/06/2017   Procedure: CYSTOSCOPY WITH RETROGRADE PYELOGRAM/URETERAL STENT PLACEMENT;  Surgeon: Marcine Matar, MD;  Location: [facility];  Service: Urology;  Laterality: Left;  . CYSTOSCOPY/URETEROSCOPY/HOLMIUM LASER/STENT PLACEMENT Left 08/21/2017   Procedure: CYSTOSCOPY,LEFT URETERAL STENT REMOVAL, LEFT URETEROSCOPY, STONE BASKET EXTRACTION AND LEFT URETERAL STENT PLACEMENT;  Surgeon: Marcine Matar, MD;  Location: [facility];  Service: Urology;  Laterality: Left;  . ESOPHAGOGASTRODUODENOSCOPY ENDOSCOPY    . TONSILLECTOMY    . TUBAL LIGATION    . TYMPANOPLASTY      OB History    Gravida Para Term Preterm AB Living   2 2   2   2    SAB TAB Ectopic Multiple Live Births                   Home Medications    Prior to Admission medications   Medication Sig Start Date End Date Taking? Authorizing Provider  atorvastatin (LIPITOR) 20 MG tablet Take 20 mg by mouth  daily.  09/18/17 09/18/18  [provider]  benzonatate (TESSALON) 100 MG capsule Take 1 capsule (100 mg total) by mouth 3 (three) times daily as needed for cough. 10/12/17   Lavera GuiseLiu, Dana Duo, MD  ciprofloxacin (CIPRO) 500 MG tablet Take 1 tablet (500 mg total) by mouth 2 (two) times daily. 1/2 tab  po bid x 7 days Patient not taking: Reported on 09/19/2017 08/07/17   Marcine Matarahlstedt, Stephen, MD  clonazePAM (KLONOPIN) 0.5 MG tablet Take 0.25-0.5 mg by mouth 2 (two) times daily as needed. 08/08/17   [provider]    lisinopril (PRINIVIL,ZESTRIL) 10 MG tablet Take 20 mg by mouth daily.  08/08/17 02/04/18  [provider]  ondansetron (ZOFRAN ODT) 4 MG disintegrating tablet 4mg  ODT q4 hours prn nausea/vomit Patient not taking: Reported on 09/19/2017 08/05/17   Bethann BerkshireZammit, Joseph, MD  ondansetron (ZOFRAN) 4 MG tablet Take 1 tablet (4 mg total) by mouth every 6 (six) hours as needed for nausea or vomiting. 10/12/17   Lavera GuiseLiu, Dana Duo, MD  oxybutynin (DITROPAN) 5 MG tablet Take 1 tablet (5 mg total) by mouth every 8 (eight) hours as needed for bladder spasms. 08/07/17   Marcine Matarahlstedt, Stephen, MD  pramipexole (MIRAPEX) 1 MG tablet Take 1 mg by mouth at bedtime.    [provider]    Family History Family History  Problem Relation Age of Onset  . Hypertension Father   . Diabetes Father   . Seizures Father   . Cancer Other   . Diabetes Other     Social History Social History   Tobacco Use  . Smoking status: Current Every Day Smoker    Packs/day: 2.00    Years: 15.00    Pack years: 30.00    Types: Cigarettes  . Smokeless tobacco: Never Used  Substance Use Topics  . Alcohol use: Yes    Comment: rarely  . Drug use: No     Allergies   Omnicef [cefdinir]; Amoxicillin; Celebrex [celecoxib]; Codeine; Penicillins; and Sulfa antibiotics   Review of Systems Review of Systems  Constitutional: Negative for appetite change, chills and fever.  HENT: Positive for congestion and rhinorrhea. Negative for sore throat and trouble swallowing.   Respiratory: Positive for cough. Negative for chest tightness, shortness of breath and wheezing.   Cardiovascular: Negative for chest pain.  Gastrointestinal: Negative for abdominal pain, nausea and vomiting.  Genitourinary: Negative for dysuria.  Musculoskeletal: Positive for myalgias (Generalized body aches). Negative for arthralgias.  Skin: Negative for rash.  Neurological: Negative for dizziness, weakness and numbness.  Hematological: Negative for  adenopathy.  All other systems reviewed and are negative.    Physical Exam Updated Vital Signs BP (!) 136/92 (BP Location: Right Arm)   Pulse 96   Temp 98.4 F (36.9 C) (Oral)   Resp 18   Ht 5\' 6"  (1.676 m)   Wt 91.2 kg (201 lb)   LMP 10/04/2017   BMI 32.44 kg/m   Physical Exam  Constitutional: She is oriented to person, place, and time. She appears well-developed and well-nourished. No distress.  HENT:  Head: Normocephalic and atraumatic.  Right Ear: Tympanic membrane and ear canal normal.  Left Ear: Tympanic membrane and ear canal normal.  Nose: Mucosal edema and rhinorrhea present.  Mouth/Throat: Uvula is midline and mucous membranes are normal. No trismus in the jaw. No uvula swelling. No oropharyngeal exudate, posterior oropharyngeal edema, posterior oropharyngeal erythema or tonsillar abscesses.  Eyes: Conjunctivae are normal.  Neck: Normal range of motion and phonation normal.  Neck supple. No Brudzinski's sign and no Kernig's sign noted.  Cardiovascular: Normal rate, regular rhythm, normal heart sounds and intact distal pulses.  No murmur heard. Pulmonary/Chest: Effort normal and breath sounds normal. No stridor. No respiratory distress. She has no wheezes. She has no rales.  Abdominal: Soft. She exhibits no distension. There is no tenderness. There is no rebound and no guarding.  Musculoskeletal: Normal range of motion. She exhibits no edema.  Lymphadenopathy:    She has no cervical adenopathy.  Neurological: She is alert and oriented to person, place, and time. She exhibits normal muscle tone. Coordination normal.  Skin: Skin is warm and dry. Capillary refill takes less than 2 seconds. No rash noted.  Psychiatric: She has a normal mood and affect.  Nursing note and vitals reviewed.    ED Treatments / Results  Labs (all labs ordered are listed, but only abnormal results are displayed) Labs Reviewed - No data to display  EKG  EKG Interpretation None        Radiology Dg Chest 2 View  Result Date: 10/12/2017 CLINICAL DATA:  Cough for the past 3 weeks.  Fever this morning. EXAM: CHEST  2 VIEW COMPARISON:  Chest x-ray dated July 27, 2014. FINDINGS: The cardiomediastinal silhouette remains borderline enlarged. Normal pulmonary vascularity. Right greater than left basilar subsegmental atelectasis. No focal consolidation, pleural effusion, or pneumothorax. No acute osseous abnormality. IMPRESSION: Bibasilar atelectasis.  No active cardiopulmonary disease. Electronically Signed   By: Obie DredgeWilliam T Derry M.D.   On: 10/12/2017 08:51    Procedures Procedures (including critical care time)  Medications Ordered in ED Medications - No data to display   Initial Impression / Assessment and Plan / ED Course  I have reviewed the triage vital signs and the nursing notes.  Pertinent labs & imaging results that were available during my care of the patient were reviewed by me and considered in my medical decision making (see chart for details).     Patient seen here yesterday for same.  Chest x-ray was performed at that time that showed no active cardiopulmonary disease.  Lungs are clear on exam.  No concerning symptoms for PE.  Patient well-appearing.  Symptoms are likely viral.  She appears stable for discharge, agrees to PCP follow-up as needed.  Final Clinical Impressions(s) / ED Diagnoses   Final diagnoses:  Influenza-like illness    ED Discharge Orders    None       Rosey Bathriplett, Dartanian Knaggs, PA-C 10/13/17 2103    Doug SouJacubowitz, Sam, MD 10/14/17 [phone]

## 2017-12-29 ENCOUNTER — Encounter (HOSPITAL_COMMUNITY): Payer: Self-pay

## 2017-12-29 ENCOUNTER — Emergency Department (HOSPITAL_COMMUNITY)
Admission: EM | Admit: 2017-12-29 | Discharge: 2017-12-29 | Disposition: A | Discharge status: Home / Self Care | Payer: Medicaid Other | Attending: Emergency Medicine | Admitting: Emergency Medicine

## 2017-12-29 ENCOUNTER — Emergency Department (HOSPITAL_COMMUNITY): Payer: Medicaid Other

## 2017-12-29 ENCOUNTER — Other Ambulatory Visit: Payer: Self-pay

## 2017-12-29 DIAGNOSIS — R062 Wheezing: Secondary | ICD-10-CM | POA: Insufficient documentation

## 2017-12-29 DIAGNOSIS — J3489 Other specified disorders of nose and nasal sinuses: Secondary | ICD-10-CM | POA: Insufficient documentation

## 2017-12-29 DIAGNOSIS — R05 Cough: Secondary | ICD-10-CM | POA: Diagnosis present

## 2017-12-29 DIAGNOSIS — R1110 Vomiting, unspecified: Secondary | ICD-10-CM | POA: Diagnosis not present

## 2017-12-29 DIAGNOSIS — I10 Essential (primary) hypertension: Secondary | ICD-10-CM | POA: Insufficient documentation

## 2017-12-29 DIAGNOSIS — J209 Acute bronchitis, unspecified: Secondary | ICD-10-CM

## 2017-12-29 DIAGNOSIS — Z885 Allergy status to narcotic agent status: Secondary | ICD-10-CM | POA: Diagnosis not present

## 2017-12-29 DIAGNOSIS — Z882 Allergy status to sulfonamides status: Secondary | ICD-10-CM | POA: Diagnosis not present

## 2017-12-29 DIAGNOSIS — Z880 Allergy status to penicillin: Secondary | ICD-10-CM | POA: Diagnosis not present

## 2017-12-29 DIAGNOSIS — R0981 Nasal congestion: Secondary | ICD-10-CM | POA: Diagnosis not present

## 2017-12-29 DIAGNOSIS — Z79899 Other long term (current) drug therapy: Secondary | ICD-10-CM | POA: Diagnosis not present

## 2017-12-29 DIAGNOSIS — F17210 Nicotine dependence, cigarettes, uncomplicated: Secondary | ICD-10-CM | POA: Insufficient documentation

## 2017-12-29 MED ORDER — IPRATROPIUM-ALBUTEROL 0.5-2.5 (3) MG/3ML IN SOLN
3.0000 mL | Freq: Once | RESPIRATORY_TRACT | Status: AC
Start: 2017-12-29 — End: 2017-12-29
  Administered 2017-12-29: 3 mL via RESPIRATORY_TRACT
  Filled 2017-12-29: qty 3

## 2017-12-29 MED ORDER — DOXYCYCLINE HYCLATE 100 MG PO CAPS: 100.0000 mg | ORAL_CAPSULE | Freq: Two times a day (BID) | ORAL | 0 refills | Status: DC

## 2017-12-29 MED ORDER — DOXYCYCLINE HYCLATE 100 MG PO TABS
100.0000 mg | ORAL_TABLET | Freq: Once | ORAL | Status: AC
  Administered 2017-12-29: 100 mg via ORAL
  Filled 2017-12-29: qty 1

## 2017-12-29 MED ORDER — BENZONATATE 100 MG PO CAPS: 100.0000 mg | ORAL_CAPSULE | Freq: Three times a day (TID) | ORAL | 0 refills | Status: DC

## 2017-12-29 MED ORDER — PREDNISONE 50 MG PO TABS: ORAL_TABLET | ORAL | 0 refills | Status: DC

## 2017-12-29 MED ORDER — ALBUTEROL SULFATE HFA 108 (90 BASE) MCG/ACT IN AERS: 2.0000 | INHALATION_SPRAY | Freq: Four times a day (QID) | RESPIRATORY_TRACT | 0 refills | Status: DC | PRN

## 2017-12-29 MED ORDER — PREDNISONE 50 MG PO TABS
60.0000 mg | ORAL_TABLET | Freq: Once | ORAL | Status: AC
  Administered 2017-12-29: 60 mg via ORAL
  Filled 2017-12-29: qty 1

## 2017-12-29 NOTE — Discharge Instructions (Signed)
Stop smoking.  Take the antibiotics and steroids as prescribed.  Follow-up with your doctor.  Return to the ED if you develop new or worsening symptoms.

## 2017-12-29 NOTE — ED Triage Notes (Signed)
Pt arrives from home c/o cough, sinus congestion, nausea and vomiting. Pt states symptoms have lasted over a week.

## 2017-12-29 NOTE — ED Provider Notes (Signed)
[facility] EMERGENCY DEPARTMENT Provider Note   CSN: [id number] Arrival date & time: 12/29/17  0106     History   Chief Complaint Chief Complaint  Patient presents with  . Cough    HPI Isabella Buchanan is a 37 y.o. female.  Patient with history of anxiety, fibromyalgia, chronic pain, IBS and interstitial cystitis presenting with a one-week history of cough, congestion.  She is a smoker but has no diagnosis of asthma or COPD.  She normally smokes about 1 pack a day but is down to about 2 or 3 cigarettes due to her cough.  She reports the cough is productive of clear and yellow mucus.  Several occasions she has had posttussive emesis.  Denies diarrhea.  Denies fever.  Has had sick contacts at home.  No abdominal pain.  Some chest pain with coughing.  No body aches or chills.  She is been taking Tessalon Perles without relief.  She has an appointment with her doctor on Monday.   The history is provided by the patient.  Cough  Associated symptoms include rhinorrhea. Pertinent negatives include no headaches and no myalgias.    Past Medical History:  Diagnosis Date  . Anxiety   . Chronic back pain   . Chronic flank pain   . Chronic pain   . DOE (dyspnea on exertion)   . Fibromyalgia   . GERD (gastroesophageal reflux disease)   . Headache(784.0)    mygrains  . Hiatal hernia   . History of electroencephalogram 11/2013   normal EEG, "psychogenic nonepileptic spell" during EEG  . Hypertension   . IBS (irritable bowel syndrome)   . Interstitial cystitis   . Kidney stones   . Polycystic ovarian syndrome   . PONV (postoperative nausea and vomiting)   . Restless leg   . Seizures (HCC)   . Swelling     Patient Active Problem List   Diagnosis Date Noted  . Ureteral calculus, left 08/06/2017  . Morbid obesity (HCC) 08/02/2016  . Hypertriglyceridemia without hypercholesterolemia 08/02/2016  . Tobacco abuse 08/02/2016  . Essential hypertriglyceridemia 06/30/2015  . Combined fat  and carbohydrate induced hyperlipemia 06/30/2015  . Nicotine addiction 06/28/2015  . Cyclic vomiting syndrome 06/28/2015  . Avitaminosis D 06/28/2015  . Essential hypertension 05/04/2015  . Degeneration of intervertebral disc of lumbar region 02/05/2015  . Anxiety, generalized 02/05/2015  . Community acquired pneumonia 07/17/2014  . CAP (community acquired pneumonia) 07/17/2014  . Sepsis (HCC) 07/17/2014  . Encephalopathy 07/17/2014  . Acute respiratory failure (HCC) 07/17/2014  . Seizure (HCC) 12/13/2013  . Seizures (HCC) 12/13/2013  . History of IBS 12/13/2013  . Fibromyalgia   . Chronic back pain   . Interstitial cystitis   . Polycystic ovarian syndrome     Past Surgical History:  Procedure Laterality Date  . CHOLECYSTECTOMY    . CYSTOSCOPY W/ URETERAL STENT PLACEMENT Left 08/06/2017   Procedure: CYSTOSCOPY WITH RETROGRADE PYELOGRAM/URETERAL STENT PLACEMENT;  Surgeon: Marcine Matar, MD;  Location: [facility];  Service: Urology;  Laterality: Left;  . CYSTOSCOPY/URETEROSCOPY/HOLMIUM LASER/STENT PLACEMENT Left 08/21/2017   Procedure: CYSTOSCOPY,LEFT URETERAL STENT REMOVAL, LEFT URETEROSCOPY, STONE BASKET EXTRACTION AND LEFT URETERAL STENT PLACEMENT;  Surgeon: Marcine Matar, MD;  Location: [facility];  Service: Urology;  Laterality: Left;  . ESOPHAGOGASTRODUODENOSCOPY ENDOSCOPY    . TONSILLECTOMY    . TUBAL LIGATION    . TYMPANOPLASTY      OB History    Gravida Para Term Preterm AB Living   2 2  2   2   SAB TAB Ectopic Multiple Live Births                   Home Medications    Prior to Admission medications   Medication Sig Start Date End Date Taking? Authorizing Provider  atorvastatin (LIPITOR) 20 MG tablet Take 20 mg by mouth daily.  09/18/17 09/18/18 Yes [provider]  benzonatate (TESSALON) 100 MG capsule Take 1 capsule (100 mg total) by mouth 3 (three) times daily as needed for cough. 10/12/17  Yes Lavera Guise, MD  clonazePAM (KLONOPIN) 0.5 MG tablet  Take 0.25-0.5 mg by mouth 2 (two) times daily as needed. 08/08/17  Yes [provider]  lisinopril (PRINIVIL,ZESTRIL) 10 MG tablet Take 20 mg by mouth daily.  08/08/17 02/04/18 Yes [provider]  pramipexole (MIRAPEX) 1 MG tablet Take 1 mg by mouth at bedtime.   Yes [provider]  ciprofloxacin (CIPRO) 500 MG tablet Take 1 tablet (500 mg total) by mouth 2 (two) times daily. 1/2 tab  po bid x 7 days Patient not taking: Reported on 09/19/2017 08/07/17   Marcine Matar, MD  ondansetron (ZOFRAN ODT) 4 MG disintegrating tablet 4mg  ODT q4 hours prn nausea/vomit Patient not taking: Reported on 09/19/2017 08/05/17   Bethann Berkshire, MD  ondansetron (ZOFRAN) 4 MG tablet Take 1 tablet (4 mg total) by mouth every 6 (six) hours as needed for nausea or vomiting. 10/12/17   Lavera Guise, MD  oxybutynin (DITROPAN) 5 MG tablet Take 1 tablet (5 mg total) by mouth every 8 (eight) hours as needed for bladder spasms. 08/07/17   Marcine Matar, MD  promethazine-dextromethorphan (PROMETHAZINE-DM) 6.25-15 MG/5ML syrup Take 5 mLs by mouth 4 (four) times daily as needed for cough. 10/13/17   Pauline Aus, PA-C    Family History Family History  Problem Relation Age of Onset  . Hypertension Father   . Diabetes Father   . Seizures Father   . Cancer Other   . Diabetes Other     Social History Social History   Tobacco Use  . Smoking status: Current Every Day Smoker    Packs/day: 2.00    Years: 15.00    Pack years: 30.00    Types: Cigarettes  . Smokeless tobacco: Never Used  Substance Use Topics  . Alcohol use: Yes    Comment: rarely  . Drug use: No     Allergies   Omnicef [cefdinir]; Amoxicillin; Celebrex [celecoxib]; Codeine; Penicillins; and Sulfa antibiotics   Review of Systems Review of Systems  Constitutional: Negative for activity change, appetite change and fever.  HENT: Positive for congestion and rhinorrhea. Negative for sinus pressure and trouble  swallowing.   Respiratory: Positive for cough.   Gastrointestinal: Positive for nausea and vomiting.  Genitourinary: Negative for dysuria, hematuria, vaginal bleeding and vaginal discharge.  Musculoskeletal: Negative for arthralgias and myalgias.  Skin: Negative for rash.  Neurological: Negative for weakness and headaches.   all other systems are negative except as noted in the HPI and PMH.     Physical Exam Updated Vital Signs BP (!) 150/101 (BP Location: Right Arm)   Pulse 92   Temp 98.3 F (36.8 C) (Oral)   Resp 18   Ht 5\' 7"  (1.702 m)   Wt 91.2 kg (201 lb)   LMP 12/01/2017 (Approximate)   SpO2 100%   BMI 31.48 kg/m   Physical Exam  Constitutional: She is oriented to person, place, and time. She appears well-developed and well-nourished.  No distress.  HENT:  Head: Normocephalic and atraumatic.  Mouth/Throat: Oropharynx is clear and moist. No oropharyngeal exudate.  Moist mucus membranes  Eyes: Conjunctivae and EOM are normal. Pupils are equal, round, and reactive to light.  Neck: Normal range of motion. Neck supple.  No meningismus.  Cardiovascular: Normal rate, regular rhythm, normal heart sounds and intact distal pulses.  No murmur heard. Pulmonary/Chest: Effort normal. No respiratory distress. She has wheezes.  Diminished breath sounds. Scattered expiratory wheezing  Abdominal: Soft. There is no tenderness. There is no rebound and no guarding.  Musculoskeletal: Normal range of motion. She exhibits no edema or tenderness.  Neurological: She is alert and oriented to person, place, and time. No cranial nerve deficit. She exhibits normal muscle tone. Coordination normal.  No ataxia on finger to nose bilaterally. No pronator drift. 5/5 strength throughout. CN 2-12 intact.Equal grip strength. Sensation intact.   Skin: Skin is warm.  Psychiatric: She has a normal mood and affect. Her behavior is normal.  Nursing note and vitals reviewed.    ED Treatments / Results    Labs (all labs ordered are listed, but only abnormal results are displayed) Labs Reviewed - No data to display  EKG  EKG Interpretation None       Radiology Dg Chest 2 View  Result Date: 12/29/2017 CLINICAL DATA:  Productive cough for 1 week EXAM: CHEST  2 VIEW COMPARISON:  10/12/2017 FINDINGS: Bronchial thickening. No consolidation or effusion. Stable cardiomediastinal silhouette. No pneumothorax. IMPRESSION: Slight central airways thickening, could be due to bronchial inflammation. No focal pulmonary infiltrate. Electronically Signed   By: Jasmine PangKim  Fujinaga M.D.   On: 12/29/2017 02:22    Procedures Procedures (including critical care time)  Medications Ordered in ED Medications  ipratropium-albuterol (DUONEB) 0.5-2.5 (3) MG/3ML nebulizer solution 3 mL (not administered)  doxycycline (VIBRA-TABS) tablet 100 mg (not administered)  predniSONE (DELTASONE) tablet 60 mg (not administered)     Initial Impression / Assessment and Plan / ED Course  I have reviewed the triage vital signs and the nursing notes.  Pertinent labs & imaging results that were available during my care of the patient were reviewed by me and considered in my medical decision making (see chart for details).    Smoker with 1 week of cough and posttussive emesis.  She is afebrile and nontoxic-appearing.  Lungs are mostly clear with a few wheezes.  No hypoxia.  Will be given steroids, bronchodilators, chest x-ray  Chest x-ray is negative for pneumonia.  Patient with no hypoxia.  Wheezing has improved after nebulizer and steroids.  We will treat for bronchitis with antibiotics, steroids and bronchodilators.  Smoking cessation encouraged.  Follow-up with PCP.  Return precautions discussed  BP 136/90 (BP Location: Right Arm)   Pulse 96   Temp 98.3 F (36.8 C) (Oral)   Resp 18   Ht 5\' 7"  (1.702 m)   Wt 91.2 kg (201 lb)   LMP 12/01/2017 (Approximate)   SpO2 99%   BMI 31.48 kg/m   Final Clinical  Impressions(s) / ED Diagnoses   Final diagnoses:  Acute bronchitis, unspecified organism    ED Discharge Orders    None       Glynn Octaveancour, Alphonse Asbridge, MD 12/29/17 [id number]

## 2017-12-29 NOTE — ED Notes (Signed)
Patient transported to X-ray 

## 2018-01-06 ENCOUNTER — Emergency Department (HOSPITAL_COMMUNITY): Payer: Medicaid Other

## 2018-01-06 ENCOUNTER — Encounter (HOSPITAL_COMMUNITY): Payer: Self-pay | Admitting: *Deleted

## 2018-01-06 ENCOUNTER — Emergency Department (HOSPITAL_COMMUNITY)
Admission: EM | Admit: 2018-01-06 | Discharge: 2018-01-06 | Discharge status: Against Medical Advice | Payer: Medicaid Other | Attending: Emergency Medicine | Admitting: Emergency Medicine

## 2018-01-06 ENCOUNTER — Other Ambulatory Visit: Payer: Self-pay

## 2018-01-06 DIAGNOSIS — R079 Chest pain, unspecified: Secondary | ICD-10-CM | POA: Insufficient documentation

## 2018-01-06 DIAGNOSIS — Z5321 Procedure and treatment not carried out due to patient leaving prior to being seen by health care provider: Secondary | ICD-10-CM | POA: Insufficient documentation

## 2018-01-06 DIAGNOSIS — R05 Cough: Secondary | ICD-10-CM | POA: Diagnosis not present

## 2018-01-06 NOTE — ED Notes (Signed)
Called patient to take back to room with no response. Registration stated they left approx 15 min ago.

## 2018-01-06 NOTE — ED Triage Notes (Addendum)
Pt reports persistent cough since November due to bronchitis. Pt recently treated with abx. Pt reports central CP that worsens with cough x 1 week. Pt also endorses n/v. NAD noted.

## 2018-05-22 DIAGNOSIS — K219 Gastro-esophageal reflux disease without esophagitis: Secondary | ICD-10-CM | POA: Insufficient documentation

## 2018-09-06 ENCOUNTER — Other Ambulatory Visit: Payer: Self-pay

## 2018-09-06 ENCOUNTER — Emergency Department (HOSPITAL_COMMUNITY): Payer: Medicaid Other

## 2018-09-06 ENCOUNTER — Encounter (HOSPITAL_COMMUNITY): Payer: Self-pay

## 2018-09-06 ENCOUNTER — Emergency Department (HOSPITAL_COMMUNITY)
Admission: EM | Admit: 2018-09-06 | Discharge: 2018-09-06 | Disposition: A | Discharge status: Home / Self Care | Payer: Medicaid Other | Attending: Emergency Medicine | Admitting: Emergency Medicine

## 2018-09-06 DIAGNOSIS — F17210 Nicotine dependence, cigarettes, uncomplicated: Secondary | ICD-10-CM | POA: Insufficient documentation

## 2018-09-06 DIAGNOSIS — R030 Elevated blood-pressure reading, without diagnosis of hypertension: Secondary | ICD-10-CM

## 2018-09-06 DIAGNOSIS — M79674 Pain in right toe(s): Secondary | ICD-10-CM | POA: Insufficient documentation

## 2018-09-06 DIAGNOSIS — Z79899 Other long term (current) drug therapy: Secondary | ICD-10-CM | POA: Diagnosis not present

## 2018-09-06 DIAGNOSIS — I10 Essential (primary) hypertension: Secondary | ICD-10-CM | POA: Insufficient documentation

## 2018-09-06 DIAGNOSIS — L089 Local infection of the skin and subcutaneous tissue, unspecified: Secondary | ICD-10-CM

## 2018-09-06 MED ORDER — DOXYCYCLINE HYCLATE 100 MG PO CAPS: 100.0000 mg | ORAL_CAPSULE | Freq: Two times a day (BID) | ORAL | 0 refills | Status: DC

## 2018-09-06 MED ORDER — DOXYCYCLINE HYCLATE 100 MG PO TABS
100.0000 mg | ORAL_TABLET | Freq: Once | ORAL | Status: AC
  Administered 2018-09-06: 100 mg via ORAL
  Filled 2018-09-06: qty 1

## 2018-09-06 MED ORDER — TRAMADOL HCL 50 MG PO TABS: 50.0000 mg | ORAL_TABLET | Freq: Four times a day (QID) | ORAL | 0 refills | Status: DC | PRN

## 2018-09-06 MED ORDER — TRAMADOL HCL 50 MG PO TABS
50.0000 mg | ORAL_TABLET | Freq: Once | ORAL | Status: AC
  Administered 2018-09-06: 50 mg via ORAL
  Filled 2018-09-06: qty 1

## 2018-09-06 MED ORDER — ACETAMINOPHEN 500 MG PO TABS
1000.0000 mg | ORAL_TABLET | Freq: Once | ORAL | Status: AC
  Administered 2018-09-06: 1000 mg via ORAL
  Filled 2018-09-06: qty 2

## 2018-09-06 NOTE — ED Provider Notes (Signed)
[facility] EMERGENCY DEPARTMENT Provider Note   CSN: [id number] Arrival date & time: 09/06/18  4098     History   Chief Complaint Chief Complaint  Patient presents with  . Toe Pain    HPI Isabella Buchanan is a 37 y.o. female past medical history as outlined below, presenting for evaluation of increased redness and swelling of her right great toe.  She was seen by a podiatrist 1 week ago and had her toenail removed secondary to an ingrown nail.  She has been using warm Epsom salts at home per his recommendation but her pain is getting worse rather than better.  She attempted to call the podiatrist today but the office is closed.  She denies fevers or chills.  There is no radiation of pain.  The history is provided by the patient.    Past Medical History:  Diagnosis Date  . Anxiety   . Chronic back pain   . Chronic flank pain   . Chronic pain   . DOE (dyspnea on exertion)   . Fibromyalgia   . GERD (gastroesophageal reflux disease)   . Headache(784.0)    mygrains  . Hiatal hernia   . History of electroencephalogram 11/2013   normal EEG, "psychogenic nonepileptic spell" during EEG  . Hypertension   . IBS (irritable bowel syndrome)   . Interstitial cystitis   . Kidney stones   . Polycystic ovarian syndrome   . PONV (postoperative nausea and vomiting)   . Restless leg   . Seizures (HCC)   . Swelling     Patient Active Problem List   Diagnosis Date Noted  . Ureteral calculus, left 08/06/2017  . Morbid obesity (HCC) 08/02/2016  . Hypertriglyceridemia without hypercholesterolemia 08/02/2016  . Tobacco abuse 08/02/2016  . Essential hypertriglyceridemia 06/30/2015  . Combined fat and carbohydrate induced hyperlipemia 06/30/2015  . Nicotine addiction 06/28/2015  . Cyclic vomiting syndrome 06/28/2015  . Avitaminosis D 06/28/2015  . Essential hypertension 05/04/2015  . Degeneration of intervertebral disc of lumbar region 02/05/2015  . Anxiety, generalized 02/05/2015  .  Community acquired pneumonia 07/17/2014  . CAP (community acquired pneumonia) 07/17/2014  . Sepsis (HCC) 07/17/2014  . Encephalopathy 07/17/2014  . Acute respiratory failure (HCC) 07/17/2014  . Seizure (HCC) 12/13/2013  . Seizures (HCC) 12/13/2013  . History of IBS 12/13/2013  . Fibromyalgia   . Chronic back pain   . Interstitial cystitis   . Polycystic ovarian syndrome     Past Surgical History:  Procedure Laterality Date  . CHOLECYSTECTOMY    . CYSTOSCOPY W/ URETERAL STENT PLACEMENT Left 08/06/2017   Procedure: CYSTOSCOPY WITH RETROGRADE PYELOGRAM/URETERAL STENT PLACEMENT;  Surgeon: Marcine Matar, MD;  Location: [facility];  Service: Urology;  Laterality: Left;  . CYSTOSCOPY/URETEROSCOPY/HOLMIUM LASER/STENT PLACEMENT Left 08/21/2017   Procedure: CYSTOSCOPY,LEFT URETERAL STENT REMOVAL, LEFT URETEROSCOPY, STONE BASKET EXTRACTION AND LEFT URETERAL STENT PLACEMENT;  Surgeon: Marcine Matar, MD;  Location: [facility];  Service: Urology;  Laterality: Left;  . ESOPHAGOGASTRODUODENOSCOPY ENDOSCOPY    . TONSILLECTOMY    . TUBAL LIGATION    . TYMPANOPLASTY       OB History    Gravida  2   Para  2   Term      Preterm  2   AB      Living  2     SAB      TAB      Ectopic      Multiple      Live Births  Home Medications    Prior to Admission medications   Medication Sig Start Date End Date Taking? Authorizing Provider  albuterol (PROVENTIL HFA;VENTOLIN HFA) 108 (90 Base) MCG/ACT inhaler Inhale 2 puffs into the lungs every 6 (six) hours as needed. 12/29/17   Rancour, Jeannett Senior, MD  atorvastatin (LIPITOR) 20 MG tablet Take 20 mg by mouth daily.  09/18/17 09/18/18  [provider]  benzonatate (TESSALON) 100 MG capsule Take 1 capsule (100 mg total) by mouth every 8 (eight) hours. 12/29/17   Rancour, Jeannett Senior, MD  ciprofloxacin (CIPRO) 500 MG tablet Take 1 tablet (500 mg total) by mouth 2 (two) times daily. 1/2 tab  po bid x 7 days Patient not taking:  Reported on 09/19/2017 08/07/17   Marcine Matar, MD  clonazePAM (KLONOPIN) 0.5 MG tablet Take 0.25-0.5 mg by mouth 2 (two) times daily as needed. 08/08/17   [provider]  doxycycline (VIBRAMYCIN) 100 MG capsule Take 1 capsule (100 mg total) by mouth 2 (two) times daily. 09/06/18   Burgess Amor, PA-C  lisinopril (PRINIVIL,ZESTRIL) 10 MG tablet Take 20 mg by mouth daily.  08/08/17 02/04/18  [provider]  ondansetron (ZOFRAN ODT) 4 MG disintegrating tablet 4mg  ODT q4 hours prn nausea/vomit Patient not taking: Reported on 09/19/2017 08/05/17   Bethann Berkshire, MD  ondansetron (ZOFRAN) 4 MG tablet Take 1 tablet (4 mg total) by mouth every 6 (six) hours as needed for nausea or vomiting. 10/12/17   Lavera Guise, MD  oxybutynin (DITROPAN) 5 MG tablet Take 1 tablet (5 mg total) by mouth every 8 (eight) hours as needed for bladder spasms. 08/07/17   Marcine Matar, MD  pramipexole (MIRAPEX) 1 MG tablet Take 1 mg by mouth at bedtime.    [provider]  predniSONE (DELTASONE) 50 MG tablet 1 tablet PO daily 12/29/17   Rancour, Jeannett Senior, MD  promethazine-dextromethorphan (PROMETHAZINE-DM) 6.25-15 MG/5ML syrup Take 5 mLs by mouth 4 (four) times daily as needed for cough. 10/13/17   Triplett, Tammy, PA-C  traMADol (ULTRAM) 50 MG tablet Take 1 tablet (50 mg total) by mouth every 6 (six) hours as needed. 09/06/18   Burgess Amor, PA-C    Family History Family History  Problem Relation Age of Onset  . Hypertension Father   . Diabetes Father   . Seizures Father   . Cancer Other   . Diabetes Other     Social History Social History   Tobacco Use  . Smoking status: Current Every Day Smoker    Packs/day: 2.00    Years: 15.00    Pack years: 30.00    Types: Cigarettes  . Smokeless tobacco: Never Used  Substance Use Topics  . Alcohol use: Yes    Comment: rarely  . Drug use: No     Allergies   Omnicef [cefdinir]; Amoxicillin; Celebrex [celecoxib]; Codeine; Penicillins;  and Sulfa antibiotics   Review of Systems Review of Systems  Constitutional: Negative for fever.  Musculoskeletal: Positive for arthralgias. Negative for joint swelling and myalgias.  Skin: Positive for color change and wound.  Neurological: Negative for weakness and numbness.     Physical Exam Updated Vital Signs BP (!) 152/103 (BP Location: Left Arm)   Pulse 88   Temp 98.4 F (36.9 C) (Oral)   Resp 12   Ht 5\' 6"  (1.676 m)   Wt 90.7 kg   LMP 08/11/2018   SpO2 100%   BMI 32.28 kg/m   Physical Exam  Constitutional: She appears well-developed and well-nourished.  HENT:  Head:  Atraumatic.  Neck: Normal range of motion.  Cardiovascular:  Pulses equal bilaterally  Musculoskeletal: She exhibits edema and tenderness. She exhibits no deformity.  Tenderness to palpation of the right great distal toe.  There is erythema and edema surrounding the nail bed, nail plate is not present.  There is dried yellow exudate along the proximal nail fold.  There is no induration or fluctuance to suggest that abscess or paronychia.  There is no red streaking.  Neurological: She is alert. She has normal strength. She displays normal reflexes. No sensory deficit.  Skin: Skin is warm and dry. There is erythema.  Psychiatric: She has a normal mood and affect.     ED Treatments / Results  Labs (all labs ordered are listed, but only abnormal results are displayed) Labs Reviewed - No data to display  EKG None  Radiology Dg Toe Great Right  Result Date: 09/06/2018 CLINICAL DATA:  37 year old female with right 1st toe redness and swelling status post toenail removal four days ago. EXAM: RIGHT GREAT TOE COMPARISON:  None. FINDINGS: No fracture or dislocation. No osteolysis. No soft tissue gas. Bone mineralization within normal limits. IMPRESSION: No radiographic abnormality of the right great toe at this time. Electronically Signed   By: Odessa Fleming M.D.   On: 09/06/2018 09:26     Procedures Procedures (including critical care time)  Medications Ordered in ED Medications  doxycycline (VIBRA-TABS) tablet 100 mg (has no administration in time range)  traMADol (ULTRAM) tablet 50 mg (has no administration in time range)  acetaminophen (TYLENOL) tablet 1,000 mg (1,000 mg Oral Given 09/06/18 0911)     Initial Impression / Assessment and Plan / ED Course  I have reviewed the triage vital signs and the nursing notes.  Pertinent labs & imaging results that were available during my care of the patient were reviewed by me and considered in my medical decision making (see chart for details).     X-ray negative for osteomyelitis.  Patient does have a toe infection, no obvious abscess or paronychia present.  She was advised to continue twice daily warm water soaks.  She was placed on doxycycline, also prescribed tramadol for additional pain relief.  Patient is to take hydrocodone 3 times daily at baseline for chronic pain which she states is not improving her toe pain.  Unable to review [facility] narcotic database as the site was not responding.  Final Clinical Impressions(s) / ED Diagnoses   Final diagnoses:  Toe infection  Elevated blood pressure reading    ED Discharge Orders         Ordered    traMADol (ULTRAM) 50 MG tablet  Every 6 hours PRN     09/06/18 0944    doxycycline (VIBRAMYCIN) 100 MG capsule  2 times daily     09/06/18 0944           Burgess Amor, PA-C 09/06/18 1610    Doug Sou, MD 09/06/18 [phone]

## 2018-09-06 NOTE — ED Notes (Signed)
Pt left with family driving  

## 2018-09-06 NOTE — ED Notes (Signed)
Pt to xray

## 2018-09-06 NOTE — ED Notes (Signed)
Right toe is scabbed over, reddened, and swollen

## 2018-09-06 NOTE — ED Triage Notes (Signed)
Pt had toenail removed on Monday. Was discharged and told to do epsom salt soaks for 2 days and then unwrap foot. Pt is now complaining of right toe pain. Redness and swelling noted.

## 2018-09-06 NOTE — Discharge Instructions (Addendum)
Continue warm soaks as recommended by your podiatrist.  Take the entire course of the doxycycline, next dose this evening as you have received this morning's dose here.  You may try tramadol in between your regular doses of hydrocodone and use warm compresses and elevation to help with pain relief.  Follow-up with your podiatrist for further management if your symptoms persist or worsen.  Your x-rays are negative for bony involvement with this infection.  Your blood pressure is elevated today which may be due to your current pain.  Make sure you are taking your home blood pressure medicines.

## 2018-11-30 IMAGING — DX DG CHEST 2V
2 series · 2 of 2 positions shown · non-contrast
Comparison: 12/29/2017

CLINICAL DATA: Cough and central chest pain.

EXAM:
CHEST  2 VIEW

[chest pa]
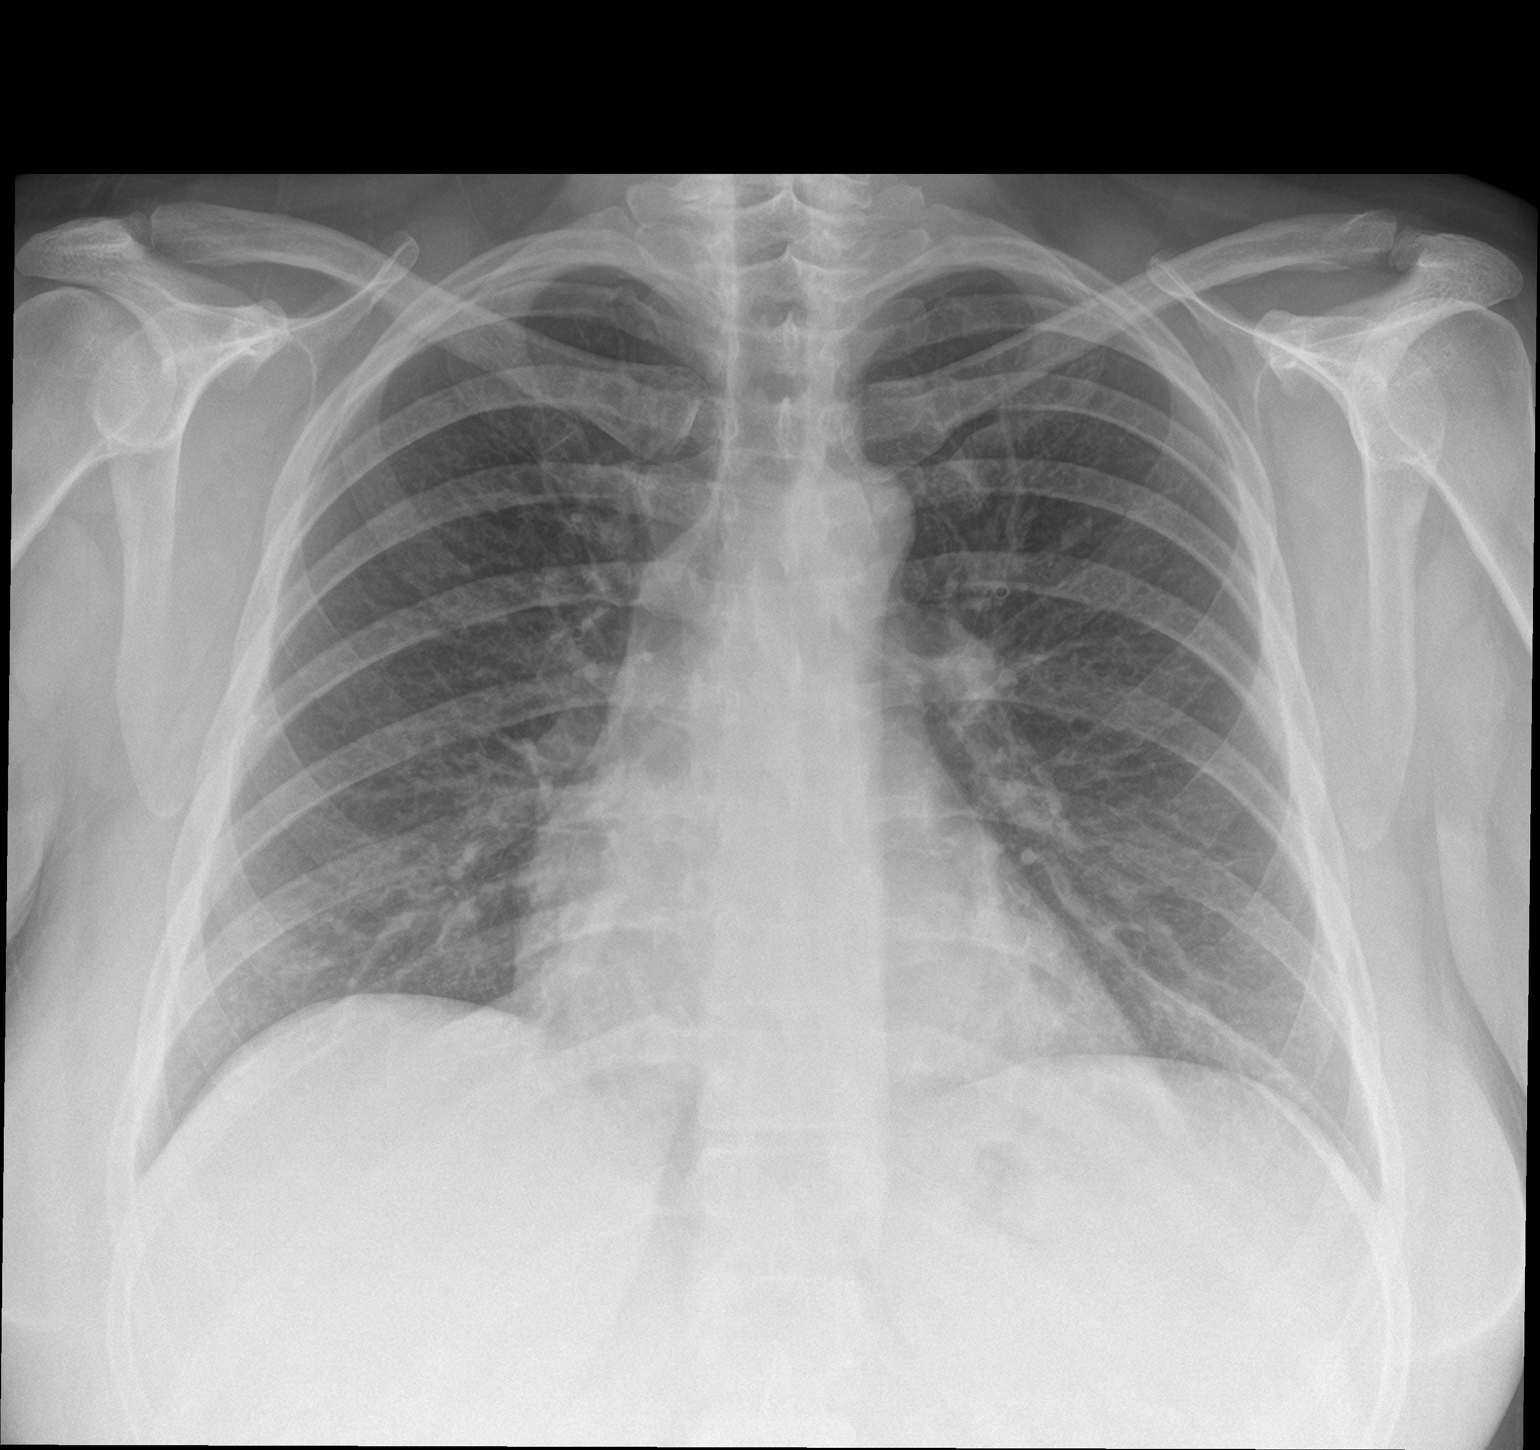

[chest lat]
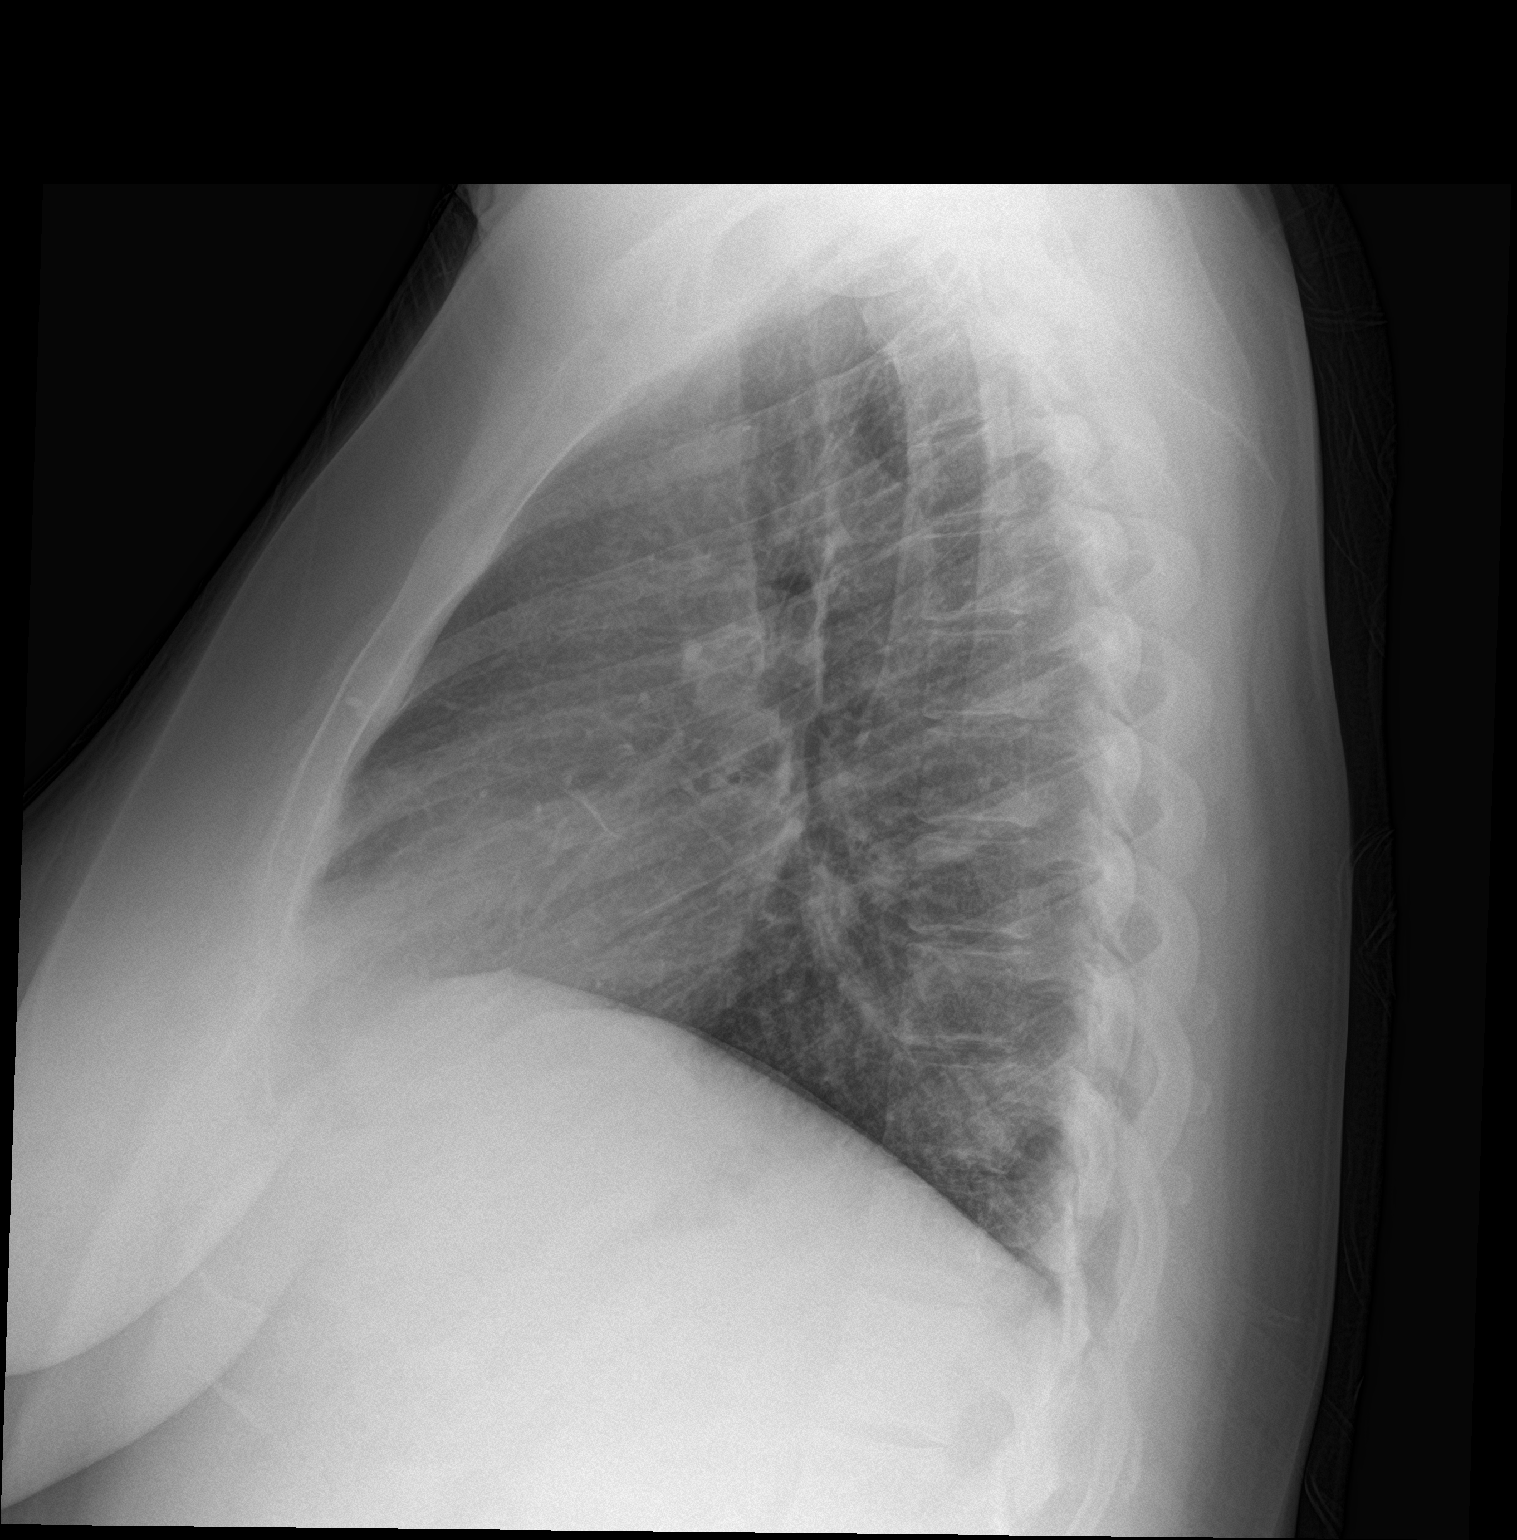

[2 of 2 positions shown; findings below may reference images not displayed]

FINDINGS: The cardiomediastinal contours are normal. Improved bronchial
thickening from prior exam. Linear left lung base atelectasis.
Pulmonary vasculature is normal. No consolidation, pleural effusion,
or pneumothorax. No acute osseous abnormalities are seen.
IMPRESSION: Improved bronchial thickening from prior exam. Linear left lung base
atelectasis.

## 2019-01-22 DIAGNOSIS — R609 Edema, unspecified: Secondary | ICD-10-CM | POA: Insufficient documentation

## 2019-01-22 DIAGNOSIS — R600 Localized edema: Secondary | ICD-10-CM | POA: Insufficient documentation

## 2019-01-24 ENCOUNTER — Telehealth: Payer: Self-pay

## 2019-01-24 NOTE — Telephone Encounter (Signed)
Notes on file, referral sent to scheduling.  

## 2019-03-26 ENCOUNTER — Telehealth (INDEPENDENT_AMBULATORY_CARE_PROVIDER_SITE_OTHER): Payer: Medicaid Other | Admitting: Cardiology

## 2019-03-26 VITALS — BP 173/103 | HR 98

## 2019-03-26 DIAGNOSIS — R609 Edema, unspecified: Secondary | ICD-10-CM

## 2019-03-26 DIAGNOSIS — I10 Essential (primary) hypertension: Secondary | ICD-10-CM | POA: Diagnosis not present

## 2019-03-26 DIAGNOSIS — R072 Precordial pain: Secondary | ICD-10-CM

## 2019-03-26 DIAGNOSIS — Z716 Tobacco abuse counseling: Secondary | ICD-10-CM | POA: Diagnosis not present

## 2019-03-26 DIAGNOSIS — R0602 Shortness of breath: Secondary | ICD-10-CM

## 2019-03-26 DIAGNOSIS — Z7189 Other specified counseling: Secondary | ICD-10-CM

## 2019-03-26 NOTE — Patient Instructions (Signed)
Medication Instructions:  Your Physician recommend you continue on your current medication as directed.    If you need a refill on your cardiac medications before your next appointment, please call your pharmacy.   Lab work: None   Testing/Procedures: Your physician has requested that you have an echocardiogram. Echocardiography is a painless test that uses sound waves to create images of your heart. It provides your doctor with information about the size and shape of your heart and how well your heart's chambers and valves are working. This procedure takes approximately one hour. There are no restrictions for this procedure. [address]   Follow-Up: At [facility], you and your health needs are our priority.  As part of our continuing mission to provide you with exceptional heart care, we have created designated Provider Care Teams.  These Care Teams include your primary Cardiologist (physician) and Advanced Practice Providers (APPs -  Physician Assistants and Nurse Practitioners) who all work together to provide you with the care you need, when you need it. You will need a follow up appointment in 4-6 weeks.  Please call our office 2 months in advance to schedule this appointment.  You may see Dr. Cristal Deer or one of the following Advanced Practice Providers on your designated Care Team:   Theodore Demark, PA-C . Joni Reining, DNP, ANP

## 2019-03-26 NOTE — Progress Notes (Signed)
Virtual Visit via Telephone Note   This visit type was conducted due to national recommendations for restrictions regarding the COVID-19 Pandemic (e.g. social distancing) in an effort to limit this patient's exposure and mitigate transmission in our community.  Due to her co-morbid illnesses, this patient is at least at moderate risk for complications without adequate follow up.  This format is felt to be most appropriate for this patient at this time.  The patient did not have access to video technology/had technical difficulties with video requiring transitioning to audio format only (telephone).  All issues noted in this document were discussed and addressed.  No physical exam could be performed with this format.  Please refer to the patient's chart for her  consent to telehealth for [facility].   Date:  03/26/2019   ID:  Isabella BrasilCrystal M Lessner, DOB 28-Feb-1981, MRN [id number]  Patient Location: Home Provider Location: Home  PCP:  Ladora DanielBeal, Sheri, PA-C  Cardiologist:  No primary care provider on file. (last Dr. Royann Shiversroitoru in 2017) Electrophysiologist:  None   Evaluation Performed:  New Patient Evaluation to me, prior Dr. Salena Saner.  Chief Complaint:  Chest pain, shortness of breath  History of Present Illness:    Isabella Buchanan is a 38 y.o. female with bilateral LE edema, hypertension, hyperlipidemia, tobacco use  The patient does not have symptoms concerning for COVID-19 infection (fever, chills, cough, or new shortness of breath).   She last saw Dr. Royann Shiversroitoru for a new patient visit in 07/2016. She was recommended for an echocardiogram and venous duplex at that time, but it does not appear that these were completed.   She saw Dr. Herold HarmsBeal for LE edema and intermittent chest pain in 01/2019. She has also recently been following with Dr. Boris LownBeal's office for acute bronchitis.Per most recent note, she has been having swelling R>L.   Mom had massive MI 3 years ago in her mid-50s, widowmaker, getting an LVAD,  father passed away from arrhythmia at age 39. No other known history on her dad's side. Mom's side also has heart disease in grandma. Aunt has afib.  Suzanna ObeyKatelyn Dillon (her daughter) has seen me in the office before as well.  Chest pain: -Initial onset: had an episode of chest pain in 01/2019. Has had bronchitis with antibiotics, chest pain since March. Chest pain has been in the background when she's been dealing with breathing issues and coughing. Started to feel better after getting a round of prednisone with singulair and symbicort. Both breathing and coughing improved.  -Quality: feels like squeezing, tightness all across her chest, down both her arms.  -Frequency: not sure, hasn't paid attention since her breathing was bad. -Duration: anywhere from 10 minutes to most of the day -Associated symptoms: has longstanding issues with GI symptoms, many years, IBS and nausea/vomiting unchanged -Aggravating/alleviating factors: no clear symptoms -Prior cardiac history: none -Prior ECG:  -Prior workup: none -Prior treatment: none -Diet: eats some fried foods, a lot of eating out recently.  -Alcohol: none -Tobacco: 2 ppd currently, ranges between 1-2.5 ppd.  -Comorbidities: HTN, HLD -Exercise level: no intentional exercise, does get short of breath climbing stairs  Hasn't taken any of her medicines (usually takes at night). Usually well controlled.   Has noted swelling only on the right side of her body only, both upper and lower extremities, but only on right side. Thinks   Hasn't been able to lie flat with her recent URI, feels short of breath trying to lie down. Noted at her recent  visit she was told that her weight was up from prior visits. Doesn't weight self at home at al given her daughter's history with an eating disorder (no scales in the house). Usually between 210-214 lbs.  Has a history of kidney issues, has had up to 13 kidney stones pulled out before. -fasting lipids, UA, CMP,  pending labs with her PCP   Past Medical History:  Diagnosis Date  . Anxiety   . Chronic back pain   . Chronic flank pain   . Chronic pain   . DOE (dyspnea on exertion)   . Fibromyalgia   . GERD (gastroesophageal reflux disease)   . Headache(784.0)    mygrains  . Hiatal hernia   . History of electroencephalogram 11/2013   normal EEG, "psychogenic nonepileptic spell" during EEG  . Hypertension   . IBS (irritable bowel syndrome)   . Interstitial cystitis   . Kidney stones   . Polycystic ovarian syndrome   . PONV (postoperative nausea and vomiting)   . Restless leg   . Seizures (HCC)   . Swelling    Past Surgical History:  Procedure Laterality Date  . CHOLECYSTECTOMY    . CYSTOSCOPY W/ URETERAL STENT PLACEMENT Left 08/06/2017   Procedure: CYSTOSCOPY WITH RETROGRADE PYELOGRAM/URETERAL STENT PLACEMENT;  Surgeon: Marcine Matar, MD;  Location: [facility];  Service: Urology;  Laterality: Left;  . CYSTOSCOPY/URETEROSCOPY/HOLMIUM LASER/STENT PLACEMENT Left 08/21/2017   Procedure: CYSTOSCOPY,LEFT URETERAL STENT REMOVAL, LEFT URETEROSCOPY, STONE BASKET EXTRACTION AND LEFT URETERAL STENT PLACEMENT;  Surgeon: Marcine Matar, MD;  Location: [facility];  Service: Urology;  Laterality: Left;  . ESOPHAGOGASTRODUODENOSCOPY ENDOSCOPY    . TONSILLECTOMY    . TUBAL LIGATION    . TYMPANOPLASTY       No outpatient medications have been marked as taking for the 03/26/19 encounter (Telemedicine) with Jodelle Red, MD.     Allergies:   Omnicef [cefdinir]; Amoxicillin; Celebrex [celecoxib]; Codeine; Penicillins; and Sulfa antibiotics   Social History   Tobacco Use  . Smoking status: Current Every Day Smoker    Packs/day: 2.00    Years: 15.00    Pack years: 30.00    Types: Cigarettes  . Smokeless tobacco: Never Used  Substance Use Topics  . Alcohol use: Yes    Comment: rarely  . Drug use: No     Family Hx: The patient's family history includes Cancer in an other family member;  Diabetes in her father and another family member; Hypertension in her father; Seizures in her father.  ROS:   Please see the history of present illness.    Constitutional: Negative for chills, fever, night sweats, unintentional weight loss  HENT: Negative for ear pain and hearing loss.   Eyes: Negative for loss of vision and eye pain.  Respiratory: Recent URI symptoms, as above  Cardiovascular: See HPI. Gastrointestinal: Negative for abdominal pain, melena, and hematochezia.  Genitourinary: Negative for dysuria and hematuria.  Musculoskeletal: Negative for falls and myalgias.  Skin: Negative for itching and rash.  Neurological: Negative for focal weakness, focal sensory changes and loss of consciousness.  Endo/Heme/Allergies: Does not bruise/bleed easily.  All other systems reviewed and are negative.   Prior CV studies:   The following studies were reviewed today: No prior available  Labs/Other Tests and Data Reviewed:    EKG:  An ECG dated 01/06/18 was personally reviewed today and demonstrated:  sinus tach at 104 bpm  Recent Labs: No results found for requested labs within last 8760 hours.   Recent Lipid Panel  No results found for: CHOL, TRIG, HDL, CHOLHDL, LDLCALC, LDLDIRECT  Wt Readings from Last 3 Encounters:  09/06/18 200 lb (90.7 kg)  01/06/18 202 lb (91.6 kg)  12/29/17 201 lb (91.2 kg)     Objective:    Vital Signs:  BP (!) 173/103   Pulse 98   Occasional cough on phone, but speaks without difficulty, no acute distress.  ASSESSMENT & PLAN:    Chest pain, shortness of breath, edema: she feels that breathing is more of an issue than chest pain. With persistent URI/bronchitis symptoms (only somewhat responsive to treatment) and reports of swelling, concern that this may be cardiac in nature. -echocardiogram first -if any evidence of wall motion abnormalities, would consider CT coronary vs. Cath depending on severity -she has a significant smoking history, so this  may also be lung related. If echo unrevealing, would recommend once she has improved symptomatically to have PFTs done. -counseled on red flag warning signs that need immediate medical attention  Hypertension: hasn't taken any medications yet today. Reports her readings are usually well controlled. Instructed to call us if her BP remains elevated.  Tobacco abuse: With her shortness of breath, very important to work on quitting. The patient was counseled on tobacco cessation today for 5 minutes.  Counseling included reviewing the risks of smoking tobacco products, how it impacts the patient's current medical diagnoses and different strategies for quitting.  Pharmacotherapy to aid in tobacco cessation was not prescribed today.  COVID-19 Education: The signs and symptoms of COVID-19 were discussed with the patient and how to seek care for testing (follow up with PCP or arrange E-visit).  The importance of social distancing was discussed today.  Time:   Today, I have spent 43 minutes with the patient with telehealth technology discussing the above problems.    Patient Instructions  Medication Instructions:  Your Physician recommend you continue on your current medication as directed.    If you need a refill on your cardiac medications before your next appointment, please call your pharmacy.   Lab work: None   Testing/Procedures: Your physician has requested that you have an echocardiogram. Echocardiography is a painless test that uses sound waves to create images of your heart. It provides your doctor with information about the size and shape of your heart and how well your heart's chambers and valves are working. This procedure takes approximately one hour. There are no restrictions for this procedure. [address]   Follow-Up: At [facility], you and your health needs are our priority.  As part of our continuing mission to provide you with exceptional heart care, we  have created designated Provider Care Teams.  These Care Teams include your primary Cardiologist (physician) and Advanced Practice Providers (APPs -  Physician Assistants and Nurse Practitioners) who all work together to provide you with the care you need, when you need it. You will need a follow up appointment in 4-6 weeks.  Please call our office 2 months in advance to schedule this appointment.  You may see Dr. Cristal Deer or one of the following Advanced Practice Providers on your designated Care Team:   Theodore Demark, PA-C . Joni Reining, DNP, ANP      Medication Adjustments/Labs and Tests Ordered: Current medicines are reviewed at length with the patient today.  Concerns regarding medicines are outlined above.   Tests Ordered: Orders Placed This Encounter  Procedures  . ECHOCARDIOGRAM COMPLETE    Medication Changes: No orders of the defined  types were placed in this encounter.   Disposition:  Follow up 4-6 weeks to follow up on echo and symptoms  Signed, Jodelle Red, MD  03/26/2019   [facility]

## 2019-04-01 ENCOUNTER — Encounter: Payer: Self-pay | Admitting: Cardiology

## 2019-04-09 ENCOUNTER — Institutional Professional Consult (permissible substitution): Payer: Medicaid Other | Admitting: Emergency Medicine

## 2019-05-09 ENCOUNTER — Institutional Professional Consult (permissible substitution): Payer: Medicaid Other | Admitting: Internal Medicine

## 2019-05-12 ENCOUNTER — Encounter: Payer: Self-pay | Admitting: Internal Medicine

## 2019-05-12 ENCOUNTER — Other Ambulatory Visit: Payer: Self-pay

## 2019-05-12 ENCOUNTER — Telehealth: Payer: Self-pay | Admitting: Internal Medicine

## 2019-05-12 ENCOUNTER — Ambulatory Visit (INDEPENDENT_AMBULATORY_CARE_PROVIDER_SITE_OTHER): Payer: Medicaid Other

## 2019-05-12 ENCOUNTER — Ambulatory Visit: Payer: Medicaid Other | Admitting: Internal Medicine

## 2019-05-12 DIAGNOSIS — J4489 Other specified chronic obstructive pulmonary disease: Secondary | ICD-10-CM | POA: Insufficient documentation

## 2019-05-12 DIAGNOSIS — J449 Chronic obstructive pulmonary disease, unspecified: Secondary | ICD-10-CM | POA: Diagnosis not present

## 2019-05-12 DIAGNOSIS — J189 Pneumonia, unspecified organism: Secondary | ICD-10-CM

## 2019-05-12 DIAGNOSIS — F17210 Nicotine dependence, cigarettes, uncomplicated: Secondary | ICD-10-CM | POA: Diagnosis not present

## 2019-05-12 DIAGNOSIS — J181 Lobar pneumonia, unspecified organism: Secondary | ICD-10-CM

## 2019-05-12 LAB — CBC WITH DIFFERENTIAL/PLATELET
Basophils Absolute: 0.2 10*3/uL — ABNORMAL HIGH (ref 0.0–0.1)
Basophils Relative: 1.1 % (ref 0.0–3.0)
Eosinophils Absolute: 0.5 10*3/uL (ref 0.0–0.7)
Eosinophils Relative: 3.5 % (ref 0.0–5.0)
HCT: 43.8 % (ref 36.0–46.0)
Hemoglobin: 14.4 g/dL (ref 12.0–15.0)
Lymphocytes Relative: 24.5 % (ref 12.0–46.0)
Lymphs Abs: 3.3 10*3/uL (ref 0.7–4.0)
MCHC: 32.8 g/dL (ref 30.0–36.0)
MCV: 87.5 fl (ref 78.0–100.0)
Monocytes Absolute: 0.9 10*3/uL (ref 0.1–1.0)
Monocytes Relative: 6.9 % (ref 3.0–12.0)
Neutro Abs: 8.6 10*3/uL — ABNORMAL HIGH (ref 1.4–7.7)
Neutrophils Relative %: 64 % (ref 43.0–77.0)
Platelets: 377 10*3/uL (ref 150.0–400.0)
RBC: 5.01 Mil/uL (ref 3.87–5.11)
RDW: 14.7 % (ref 11.5–15.5)
WBC: 13.5 10*3/uL — ABNORMAL HIGH (ref 4.0–10.5)

## 2019-05-12 MED ORDER — DOXYCYCLINE HYCLATE 100 MG PO TABS: 100.0000 mg | ORAL_TABLET | Freq: Two times a day (BID) | ORAL | 0 refills | Status: DC

## 2019-05-12 MED ORDER — FAMOTIDINE 20 MG PO TABS: ORAL_TABLET | ORAL | 11 refills | Status: DC

## 2019-05-12 MED ORDER — PREDNISONE 10 MG PO TABS: ORAL_TABLET | ORAL | 0 refills | Status: DC

## 2019-05-12 NOTE — Telephone Encounter (Signed)
Received a call from [place] with [facility] in regards to call report:  IMPRESSION: Subtle increased opacity in the right upper lobe. Suspect early pneumonia. Lungs elsewhere clear. No adenopathy.  Followup PA and lateral chest radiographs recommended in 3-4 weeks following trial of antibiotic therapy to ensure resolution and exclude underlying malignancy.  Routing to MW as an Pharmacist, hospital

## 2019-05-12 NOTE — Progress Notes (Signed)
Isabella Buchanan, female    DOB: 1981/10/28, 38 y.o.   MRN: [id number]   Brief patient profile:  69 yowf active smoker/ grew up with smokers and freq sinus infections/ eyes watering esp in spring and required ear tubes but never saw specialist other than ent and fine by age 42 nl IUP and fine until March 2019 p new trailer x one year with freq steroids/ new nebulizer dependency better in cooler weather then it started back up abruptly again March 2020 and so started on symbicort 160 /singulair and prn neb ipatropium but can't tol albuterol due to palpitations follow at [facility] lisinopril>>  Improved cough  spring of 2019   Last levaquin April 2020    History of Present Illness  05/12/2019  Pulmonary/ 1st office eval/Isabella Buchanan  Chief Complaint  Patient presents with  . Pulmonary Consult    Referred by Dr. Melford Buchanan at [place]. Pt c/o cough off and on for the past year- currently prod with green to brown sputum.   Dyspnea: MMRC2 = can't walk a nl pace on a flat grade s sob but does fine slow and flat  Cough: esp at home assoc with sense of pnds / assoc  Purulent nasal drainage and assoc hb on ppi hs  Sleep: does ok on side flat bed 4 pillows with freq awakening with cough  SABA use: no alb / has  ipatropium  Not using much   No obvious day to day or daytime variability or assoc excess/ purulent sputum or mucus plugs or hemoptysis or cp or chest tightness, subjective wheeze or overt sinus or hb symptoms.    Also denies any obvious fluctuation of symptoms with weather or environmental changes or other aggravating or alleviating factors except as outlined above   No unusual exposure hx or h/o childhood pna/ asthma or knowledge of premature birth.  Current Allergies, Complete Past Medical History, Past Surgical History, Family History, and Social History were reviewed in [employer] record.  ROS  The following are not active complaints unless bolded  Hoarseness, sore throat, dysphagia, dental problems, itching, sneezing,  nasal congestion or discharge of excess mucus or purulent secretions, ear ache,   fever, chills, sweats, unintended wt loss or wt gain, classically pleuritic or exertional cp,  orthopnea pnd or arm/hand swelling  or leg swelling, presyncope, palpitations, abdominal pain, anorexia, nausea, vomiting, diarrhea  or change in bowel habits or change in bladder habits, change in stools or change in urine, dysuria, hematuria,  rash, arthralgias, visual complaints, headache, numbness, weakness or ataxia or problems with walking or coordination,  change in mood or  memory.           Past Medical History:  Diagnosis Date  . Anxiety   . Chronic back pain   . Chronic flank pain   . Chronic pain   . DOE (dyspnea on exertion)   . Fibromyalgia   . GERD (gastroesophageal reflux disease)   . Headache(784.0)    mygrains  . Hiatal hernia   . History of electroencephalogram 11/2013   normal EEG, "psychogenic nonepileptic spell" during EEG  . Hypertension   . IBS (irritable bowel syndrome)   . Interstitial cystitis   . Kidney stones   . Polycystic ovarian syndrome   . PONV (postoperative nausea and vomiting)   . Restless leg   . Seizures ([place])   . Swelling     Outpatient Medications Prior to Visit  Medication Sig  Dispense Refill  . atorvastatin (LIPITOR) 20 MG tablet Take 20 mg by mouth daily.    . budesonide-formoterol (SYMBICORT) 80-4.5 MCG/ACT inhaler Inhale 2 puffs into the lungs 2 (two) times daily.    . clonazePAM (KLONOPIN) 0.5 MG tablet Take 0.25-0.5 mg by mouth 2 (two) times daily as needed.    Marland Kitchen. esomeprazole (NEXIUM) 40 MG capsule Take 40 mg by mouth daily at 12 noon.    . fenofibrate 160 MG tablet Take 160 mg by mouth daily.    . furosemide (LASIX) 20 MG tablet Take 20 mg by mouth as needed.    . gabapentin (NEURONTIN) 300 MG capsule Take 300 mg by mouth 2 (two) times a day.    Marland Kitchen. HYDROcodone-acetaminophen  (NORCO/VICODIN) 5-325 MG tablet Take 1 tablet by mouth every 6 (six) hours as needed for moderate pain.    . metFORMIN (GLUCOPHAGE-XR) 500 MG 24 hr tablet Take 1 tablet by mouth daily.    . montelukast (SINGULAIR) 10 MG tablet Take 10 mg by mouth at bedtime.    . ondansetron (ZOFRAN) 4 MG tablet Take 1 tablet (4 mg total) by mouth every 6 (six) hours as needed for nausea or vomiting. 20 tablet 0  . pramipexole (MIRAPEX) 1 MG tablet Take 1.5 mg by mouth at bedtime.     Marland Kitchen. tiZANidine (ZANAFLEX) 4 MG tablet Take 4 mg by mouth 2 (two) times daily as needed for muscle spasms.    . valsartan (DIOVAN) 80 MG tablet Take 80 mg by mouth daily.    . pregabalin (LYRICA) 150 MG capsule Take 150 mg by mouth 2 (two) times daily.        Objective:     BP 124/84 (BP Location: Left Arm, Cuff Size: Large)   Pulse 99   Temp 98.2 F (36.8 C) (Oral)   Ht 5\' 6"  (1.676 m)   Wt 212 lb (96.2 kg)   SpO2 100%   BMI 34.22 kg/m   SpO2: 100 %   RA   HEENT: nl dentition, turbinates bilaterally, and oropharynx. Nl external ear canals without cough reflex   NECK :  without JVD/Nodes/TM/ nl carotid upstrokes bilaterally   LUNGS: no acc muscle use,  Nl contour chest which is clear to A and P bilaterally without cough on insp or exp maneuvers   CV:  RRR  no s3 or murmur or increase in P2, and no edema   ABD:  soft and nontender with nl inspiratory excursion in the supine position. No bruits or organomegaly appreciated, bowel sounds nl  MS:  Nl gait/ ext warm without deformities, calf tenderness, cyanosis or clubbing No obvious joint restrictions   SKIN: warm and dry without lesions    NEURO:  alert, approp, nl sensorium with  no motor or cerebellar deficits apparent.    CXR PA and Lateral:   05/12/2019 :    I personally reviewed images and agree with radiology impression as follows:   Subtle increased opacity in the right upper lobe. Suspect early pneumonia. Lungs elsewhere clear. No adenopathy.     Labs ordered 05/12/2019   Allergy profile, alpha one screen      Assessment   No problem-specific Assessment & Plan notes found for this encounter.     Isabella HughsMichael Isidore Margraf, MD 05/12/2019

## 2019-05-12 NOTE — Assessment & Plan Note (Signed)
Counseled re importance of smoking cessation but did not meet time criteria for separate billing      Total time devoted to counseling  > 50 % of initial 60 min office visit:  review case with pt/  device teaching which extended face to face time for this visit / discussion of options/alternatives/ personally creating written customized instructions  in presence of pt  then going over those specific  Instructions directly with the pt including how to use all of the meds but in particular covering each new medication in detail and the difference between the maintenance= "automatic" meds and the prns using an action plan format for the latter (If this problem/symptom => do that organization reading Left to right).  Please see AVS from this visit for a full list of these instructions which I personally wrote for this pt and  are unique to this visit.  

## 2019-05-12 NOTE — Patient Instructions (Addendum)
Check your meds against your medication list   Change Nexium to take it Take 30- 60 min before your first and last meals of the day and pepcic 20 mg after supper or bedtime  Doxycycline 100 mg twice daily with glass  of water before eating  Prednisone 10 mg take  4 each am x 2 days,   2 each am x 2 days,  1 each am x 2 days and stop   The key is to stop smoking completely before smoking completely stops you!  For cough/ congestion > mucinex dm 1200 mg every 12 hours with a glass of water    GERD (REFLUX)  is an extremely common cause of respiratory symptoms just like yours , many times with no obvious heartburn at all.    It can be treated with medication, but also with lifestyle changes including elevation of the head of your bed (ideally with 6 -8inch blocks under the headboard of your bed),  Smoking cessation, avoidance of late meals, excessive alcohol, and avoid fatty foods, chocolate, peppermint, colas, red wine, and acidic juices such as orange juice.  NO MINT OR MENTHOL PRODUCTS SO NO COUGH DROPS  USE SUGARLESS CANDY INSTEAD (Jolley ranchers or Stover's or Life Savers) or even ice chips will also do - the key is to swallow to prevent all throat clearing. NO OIL BASED VITAMINS - use powdered substitutes.  Avoid fish oil when coughing.    Please remember to go to the lab and x-ray department   for your tests - we will call you with the results when they are available.     Please schedule a follow up office visit in 4 weeks, sooner if needed

## 2019-05-12 NOTE — Assessment & Plan Note (Signed)
Active smoker - 05/12/2019  After extensive coaching inhaler device,  effectiveness =    50% > continue symbicort 80 2bid  Plus max rx for gerd   DDX of  difficult airways management almost all start with A and  include Adherence, Ace Inhibitors, Acid Reflux, Active Sinus Disease, Alpha 1 Antitripsin deficiency, Anxiety masquerading as Airways dz,  ABPA,  Allergy(esp in young), Aspiration (esp in elderly), Adverse effects of meds,  Active smoking or vaping, A bunch of PE's (a small clot burden can't cause this syndrome unless there is already severe underlying pulm or vascular dz with poor reserve) plus two Bs  = Bronchiectasis and Beta blocker use..and one C= CHF  Adherence is always the initial "prime suspect" and is a multilayered concern that requires a "trust but verify" approach in every patient - starting with knowing how to use medications, especially inhalers, correctly, keeping up with refills and understanding the fundamental difference between maintenance and prns vs those medications only taken for a very short course and then stopped and not refilled.  - see hfa teaching - return with all meds in hand using a trust but verify approach to confirm accurate Medication  Reconciliation The principal here is that until we are certain that the  patients are doing what we've asked, it makes no sense to ask them to do more.   Active smoking also at top of list > (see separate a/p)   ? Acid (or non-acid) GERD > always difficult to exclude as up to 75% of pts in some series report no assoc GI/ Heartburn symptoms> rec max (24h)  acid suppression and diet restrictions/ reviewed and instructions given in writing.   ? Active sinus dz ? Early pna RUL > rx doxy x 10 days noting allergy to most other abx then sinus ct prn    ? Allergy > send profile / .Prednisone 10 mg take  4 each am x 2 days,   2 each am x 2 days,  1 each am x 2 days and stop   ? Alpha one AT def > send phenotype   ? Bronchiectasis  > may need hrct to sort out

## 2019-05-12 NOTE — Assessment & Plan Note (Signed)
Possible as dz rul > doxy x 10 days then f/u with cxr in 4 weeks

## 2019-05-12 NOTE — Telephone Encounter (Signed)
See ov and result note same day

## 2019-05-13 NOTE — Progress Notes (Signed)
ATC, line rings and then turns busy

## 2019-05-14 ENCOUNTER — Encounter: Payer: Self-pay | Admitting: Internal Medicine

## 2019-05-16 ENCOUNTER — Encounter (HOSPITAL_COMMUNITY): Payer: Self-pay | Admitting: Cardiology

## 2019-05-18 LAB — RESPIRATORY ALLERGY PROFILE REGION II ~~LOC~~
Allergen, A. alternata, m6: <0.1 kU/L
Allergen, Cedar tree, t12: <0.1 kU/L
Allergen, Comm Silver Birch, t9: 0.52 kU/L — ABNORMAL HIGH
Allergen, Cottonwood, t14: <0.1 kU/L
Allergen, D pternoyssinus,d7: <0.1 kU/L
Allergen, Mouse Urine Protein, e78: <0.1 kU/L
Allergen, Mulberry, t76: <0.1 kU/L
Allergen, Oak,t7: 0.44 kU/L — ABNORMAL HIGH
Allergen, P. notatum, m1: <0.1 kU/L
Aspergillus fumigatus, m3: <0.1 kU/L
Bermuda Grass: 0.24 kU/L — ABNORMAL HIGH
Box Elder IgE: <0.1 kU/L
CLADOSPORIUM HERBARUM (M2) IGE: <0.1 kU/L
COMMON RAGWEED (SHORT) (W1) IGE: 0.19 kU/L — ABNORMAL HIGH
Cat Dander: <0.1 kU/L
Class: 0
Class: 0
Class: 0
Class: 0
Class: 0
Class: 0
Class: 0
Class: 0
Class: 0
Class: 0
Class: 0
Class: 0
Class: 0
Class: 0
Class: 0
Class: 0
Class: 0
Class: 0
Class: 0
Class: 0
Class: 1
Class: 1
Class: 1
Class: 2
Cockroach: 0.12 kU/L — ABNORMAL HIGH
D. farinae: <0.1 kU/L
Dog Dander: <0.1 kU/L
Elm IgE: <0.1 kU/L
IgE (Immunoglobulin E), Serum: 9 kU/L (ref <=114)
Johnson Grass: 0.32 kU/L — ABNORMAL HIGH
Pecan/Hickory Tree IgE: 1.93 kU/L — ABNORMAL HIGH
Rough Pigweed  IgE: <0.1 kU/L
Sheep Sorrel IgE: 0.14 kU/L — ABNORMAL HIGH
Timothy Grass: 0.43 kU/L — ABNORMAL HIGH

## 2019-05-18 LAB — ALPHA-1 ANTITRYPSIN PHENOTYPE: A-1 Antitrypsin, Ser: 203 mg/dL — ABNORMAL HIGH (ref 83–199)

## 2019-05-18 LAB — INTERPRETATION:

## 2019-05-20 DIAGNOSIS — B369 Superficial mycosis, unspecified: Secondary | ICD-10-CM | POA: Insufficient documentation

## 2019-05-20 DIAGNOSIS — J324 Chronic pansinusitis: Secondary | ICD-10-CM | POA: Insufficient documentation

## 2019-05-20 DIAGNOSIS — H9211 Otorrhea, right ear: Secondary | ICD-10-CM | POA: Insufficient documentation

## 2019-05-27 ENCOUNTER — Ambulatory Visit (INDEPENDENT_AMBULATORY_CARE_PROVIDER_SITE_OTHER): Payer: Medicaid Other | Admitting: Urology

## 2019-05-27 DIAGNOSIS — R300 Dysuria: Secondary | ICD-10-CM

## 2019-05-28 ENCOUNTER — Other Ambulatory Visit (HOSPITAL_COMMUNITY)
Admission: RE | Admit: 2019-05-28 | Discharge: 2019-05-28 | Disposition: A | Discharge status: Home / Self Care | Payer: Medicaid Other | Source: Ambulatory Visit | Attending: Urology | Admitting: Urology

## 2019-05-28 DIAGNOSIS — R300 Dysuria: Secondary | ICD-10-CM | POA: Diagnosis not present

## 2019-05-28 LAB — URINALYSIS, COMPLETE (UACMP) WITH MICROSCOPIC
Bilirubin Urine: NEGATIVE
Glucose, UA: NEGATIVE mg/dL
Hgb urine dipstick: NEGATIVE
Ketones, ur: NEGATIVE mg/dL
Leukocytes,Ua: NEGATIVE
Nitrite: NEGATIVE
Protein, ur: NEGATIVE mg/dL
Specific Gravity, Urine: 1.013 (ref 1.005–1.030)
pH: 5 (ref 5.0–8.0)

## 2019-05-29 LAB — URINE CULTURE

## 2019-06-12 ENCOUNTER — Telehealth (HOSPITAL_COMMUNITY): Payer: Self-pay | Admitting: Radiology

## 2019-06-12 NOTE — Telephone Encounter (Signed)
Called to schedule echocardiogram-home number is not in service and number listed to leave messages on is wrong.

## 2019-06-16 ENCOUNTER — Ambulatory Visit: Payer: Medicaid Other | Admitting: Internal Medicine

## 2019-06-24 ENCOUNTER — Ambulatory Visit: Payer: Medicaid Other | Admitting: Internal Medicine

## 2019-07-02 ENCOUNTER — Ambulatory Visit (INDEPENDENT_AMBULATORY_CARE_PROVIDER_SITE_OTHER): Payer: Medicaid Other

## 2019-07-02 ENCOUNTER — Ambulatory Visit (INDEPENDENT_AMBULATORY_CARE_PROVIDER_SITE_OTHER): Payer: Medicaid Other | Admitting: Internal Medicine

## 2019-07-02 ENCOUNTER — Encounter: Payer: Self-pay | Admitting: Internal Medicine

## 2019-07-02 ENCOUNTER — Other Ambulatory Visit: Payer: Self-pay

## 2019-07-02 DIAGNOSIS — J449 Chronic obstructive pulmonary disease, unspecified: Secondary | ICD-10-CM

## 2019-07-02 DIAGNOSIS — F17210 Nicotine dependence, cigarettes, uncomplicated: Secondary | ICD-10-CM

## 2019-07-02 DIAGNOSIS — J181 Lobar pneumonia, unspecified organism: Secondary | ICD-10-CM | POA: Diagnosis not present

## 2019-07-02 DIAGNOSIS — J189 Pneumonia, unspecified organism: Secondary | ICD-10-CM

## 2019-07-02 MED ORDER — MOMETASONE FURO-FORMOTEROL FUM 200-5 MCG/ACT IN AERO: 2.0000 | INHALATION_SPRAY | Freq: Two times a day (BID) | RESPIRATORY_TRACT | 0 refills | Status: AC

## 2019-07-02 NOTE — Patient Instructions (Addendum)
Check your meds against your medication list   The key is to stop smoking completely before smoking completely stops you!  For cough/ congestion > mucinex dm 1200 mg every 12 hours with a glass of water   dulera 200 Take 2 puffs first thing in am and then another 2 puffs about 12 hours later.   Work on inhaler technique:  relax and gently blow all the way out then take a nice smooth deep breath back in, triggering the inhaler at same time you start breathing in.  Hold for up to 5 seconds if you can. Blow out thru nose. Rinse and gargle with water when done      Change Nexium to take it Take 30- 60 min before your first and last meals of the day and pepcid 20 mg after supper or bedtime  GERD (REFLUX)  is an extremely common cause of respiratory symptoms just like yours , many times with no obvious heartburn at all.    It can be treated with medication, but also with lifestyle changes including elevation of the head of your bed (ideally with 6 -8inch blocks under the headboard of your bed),  Smoking cessation, avoidance of late meals, excessive alcohol, and avoid fatty foods, chocolate, peppermint, colas, red wine, and acidic juices such as orange juice.  NO MINT OR MENTHOL PRODUCTS SO NO COUGH DROPS  USE SUGARLESS CANDY INSTEAD (Jolley ranchers or Stover's or Life Savers) or even ice chips will also do - the key is to swallow to prevent all throat clearing. NO OIL BASED VITAMINS - use powdered substitutes.  Avoid fish oil when coughing.    Please remember to go to the  x-ray department  for your tests - we will call you with the results when they are available      Please schedule a follow up office visit in 4 weeks, call sooner if needed with all medications /inhalers/ solutions in hand so we can verify exactly what you are taking. This includes all medications from all doctors and over the [place] separate them into two bags:  the ones you take automatically, no matter what, vs  the ones you take just when you feel you need them "BAG #2 is UP TO YOU"  - this will really help [place] help you take your medications more effectively.

## 2019-07-02 NOTE — Progress Notes (Signed)
Isabella Buchanan, female    DOB: [birth year], 38 y.o.   MRN: [id number]   Brief patient profile:  37 yowf active smoker/ grew up with smokers and freq sinus infections/ eyes watering esp in spring and required ear tubes but never saw specialist other than ent and fine by age 38 nl IUP and fine until March 2019 p new trailer x one year with freq steroids/ new nebulizer dependency better in cooler weather then it started back up abruptly again March 2020 and so started on symbicort 160 /singulair and prn neb ipatropium but can't tol albuterol due to palpitations [facility]    Stopped lisinopril>>  Improved cough  spring of 2019   Last levaquin April 2020    History of Present Illness  05/12/2019  Pulmonary/ 1st office eval/Isabella Buchanan  Chief Complaint  Patient presents with  . Pulmonary Consult    [facility] Pt c/o cough off and on for the past year- currently prod with green to brown sputum.   Dyspnea: MMRC2 = can't walk a nl pace on a flat grade s sob but does fine slow and flat  Cough: esp at home assoc with sense of pnds / assoc  Purulent nasal drainage and assoc hb on ppi hs  Sleep: does ok on side flat bed 4 pillows with freq awakening with cough  SABA use: no alb / has  ipatropium  Not using much  rec Check your meds against your medication list  Change Nexium to take it Take 30- 60 min before your first and last meals of the day and pepcid 20 mg after supper or bedtime Doxycycline 100 mg twice daily with glass  of water before eating Prednisone 10 mg take  4 each am x 2 days,   2 each am x 2 days,  1 each am x 2 days and stop  The key is to stop smoking completely before smoking completely stops you! For cough/ congestion > mucinex dm 1200 mg every 12 hours with a glass of water  GERD diet  Please remember to go to the lab and x-ray department   for your tests - we Isabella call you with the results when they are available.  Please schedule a follow  up office visit in 4 weeks, sooner if needed     07/02/2019  f/u ov/Isabella Buchanan re: AB/ still smoking on symb 80 2bid  Chief Complaint  Patient presents with  . Follow-up    She c/o hoarseness today. Her cough is now non prod.  Dyspnea:  MMRC2 = can't walk a nl pace on a flat grade s sob but does fine slow and flat  Cough: throat clearing using peppermint  Sleeping: able to lie flat on 4 pilows and not cough  SABA use: none  02: none    No obvious day to day or daytime variability or assoc excess/ purulent sputum or mucus plugs or hemoptysis or cp or chest tightness, subjective wheeze or overt sinus or hb symptoms.   Sleeping as above  without nocturnal  or early am exacerbation  of respiratory  c/o's or need for noct saba. Also denies any obvious fluctuation of symptoms with weather or environmental changes or other aggravating or alleviating factors except as outlined above   No unusual exposure hx or h/o childhood pna/ asthma or knowledge of premature birth.  Current Allergies, Complete Past Medical History, Past Surgical History, Family History, and Social History were reviewed  in [employer] record.  ROS  The following are not active complaints unless bolded Hoarseness, sore throat, dysphagia, dental problems, itching, sneezing,  nasal congestion / sensation of discharge of excess mucus or purulent secretions, ear ache,   fever, chills, sweats, unintended wt loss or wt gain, classically pleuritic or exertional cp,  orthopnea pnd or arm/hand swelling  or leg swelling, presyncope, palpitations, abdominal pain, anorexia, nausea, vomiting, diarrhea  or change in bowel habits or change in bladder habits, change in stools or change in urine, dysuria, hematuria,  rash, arthralgias, visual complaints, headache, numbness, weakness or ataxia or problems with walking or coordination,  change in mood or  memory.        Current Meds  Medication Sig  . atorvastatin (LIPITOR) 20 MG  tablet Take 20 mg by mouth daily.  . budesonide-formoterol (SYMBICORT) 80-4.5 MCG/ACT inhaler Inhale 2 puffs into the lungs 2 (two) times daily.  . clonazePAM (KLONOPIN) 0.5 MG tablet Take 0.25-0.5 mg by mouth 2 (two) times daily as needed.  Marland Kitchen esomeprazole (NEXIUM) 40 MG capsule Take 40 mg by mouth daily at 12 noon.  . famotidine (PEPCID) 20 MG tablet One after supper  . fenofibrate 160 MG tablet Take 160 mg by mouth daily.  . furosemide (LASIX) 20 MG tablet Take 20 mg by mouth as needed.  . gabapentin (NEURONTIN) 300 MG capsule Take 300 mg by mouth 2 (two) times a day.  Marland Kitchen HYDROcodone-acetaminophen (NORCO/VICODIN) 5-325 MG tablet Take 1 tablet by mouth every 6 (six) hours as needed for moderate pain.  . metFORMIN (GLUCOPHAGE-XR) 500 MG 24 hr tablet Take 1 tablet by mouth daily.  . montelukast (SINGULAIR) 10 MG tablet Take 10 mg by mouth at bedtime.  . ondansetron (ZOFRAN) 4 MG tablet Take 1 tablet (4 mg total) by mouth every 6 (six) hours as needed for nausea or vomiting.  . pramipexole (MIRAPEX) 1 MG tablet Take 1.5 mg by mouth at bedtime.   Marland Kitchen tiZANidine (ZANAFLEX) 4 MG tablet Take 4 mg by mouth 2 (two) times daily as needed for muscle spasms.  . valsartan (DIOVAN) 80 MG tablet Take 80 mg by mouth daily.                       Past Medical History:  Diagnosis Date  . Anxiety   . Chronic back pain   . Chronic flank pain   . Chronic pain   . DOE (dyspnea on exertion)   . Fibromyalgia   . GERD (gastroesophageal reflux disease)   . Headache(784.0)    mygrains  . Hiatal hernia   . History of electroencephalogram 11/2013   normal EEG, "psychogenic nonepileptic spell" during EEG  . Hypertension   . IBS (irritable bowel syndrome)   . Interstitial cystitis   . Kidney stones   . Polycystic ovarian syndrome   . PONV (postoperative nausea and vomiting)   . Restless leg   . Seizures ([place])   . Swelling       Objective:    amb obese wf nad  Wt Readings from Last 3 Encounters:   07/02/19 216 lb 9.6 oz (98.2 kg)  05/12/19 212 lb (96.2 kg)  09/06/18 200 lb (90.7 kg)     Vital signs reviewed - Note on arrival 02 sats  99% on RA      HEENT: nl dentition, turbinates bilaterally, and oropharynx. Nl external ear canals without cough reflex   NECK :  without JVD/Nodes/TM/ nl carotid  upstrokes bilaterally   LUNGS: no acc muscle use,  Nl contour chest with a few exp rhonchi  bilaterally without cough on insp or exp maneuvers   CV:  RRR  no s3 or murmur or increase in P2, and no edema   ABD:  obese   soft and nontender with nl inspiratory excursion in the supine position. No bruits or organomegaly appreciated, bowel sounds nl  MS:  Nl gait/ ext warm without deformities, calf tenderness, cyanosis or clubbing No obvious joint restrictions   SKIN: warm and dry without lesions    NEURO:  alert, approp, nl sensorium with  no motor or cerebellar deficits apparent.      CXR PA and Lateral:   07/02/2019 :    I personally reviewed images and agree with radiology impression as follows:    1. Central airways thickening, corresponding to history of asthma. Slight increased interstitial and hazy opacity at the right middle lobe and lingula, may reflect acute on chronic interstitial inflammatory process or infection. No focal consolidation     Assessment

## 2019-07-03 ENCOUNTER — Encounter: Payer: Self-pay | Admitting: Internal Medicine

## 2019-07-03 NOTE — Assessment & Plan Note (Signed)
Body mass index is 36.04 kg/m.  -  trending up  No results found for: TSH   Contributing to gerd risk/ doe/reviewed the need and the process to achieve and maintain neg calorie balance > defer f/u primary care including intermittently monitoring thyroid status

## 2019-07-03 NOTE — Assessment & Plan Note (Signed)
4-5 min discussion re active cigarette smoking in addition to office E&M  Ask about tobacco use:  Ongoing though down to one half pack Advise quitting   I took an extended  opportunity with this patient to outline the consequences of continued cigarette use  in airway disorders based on all the data we have from the multiple national lung health studies (perfomed over decades at millions of dollars in cost)  indicating that smoking cessation, not choice of inhalers or physicians, is the most important aspect of care.   Assess willingness:  Not committed at this point Assist in quit attempt:  Per PCP when ready Arrange follow up:   Follow up per Primary Care planned

## 2019-07-03 NOTE — Assessment & Plan Note (Signed)
Possible as dz rul > doxy x 10 days    cxr reviewed, no evidence of as dz/ haven't ruled out bronchiectasis but issue is moot for now, no further abx indicated

## 2019-07-03 NOTE — Assessment & Plan Note (Addendum)
Active smoker - 05/12/2019  continue symbicort 80 2bid  Plus max rx for gerd  - Allergy profile 05/12/2019 >  Eos 0.5 /  IgE  3  RAST pos ragweed, trees, grass - Alpha one AT screen 05/12/2019 >   Level 203  MM - 07/02/2019  After extensive coaching inhaler device,  effectiveness =    90%> try dulera 200 2bid     She has not been compliant with gerd rx and some of her symptoms (urge to clear throat) may be due to LPR but could also be from the adverse effects of ICS on upper airway > to tease this out rec repeat gerd rx (no MINTS) and try high dose ics to see if gets worse and if so go back to low dose with spacer next ov.   I had an extended discussion with the patient reviewing all relevant studies completed to date and  lasting 15 to 20 minutes of a 25 minute visit    I performed detailed device teaching using a teach back method which extended face to face time for this visit (see above)  Each maintenance medication was reviewed in detail including emphasizing most importantly the difference between maintenance and prns and under what circumstances the prns are to be triggered using an action plan format that is not reflected in the computer generated alphabetically organized AVS which I have not found useful in most complex patients, especially with respiratory illnesses  Please see AVS for specific instructions unique to this visit that I personally wrote and verbalized to the the pt in detail and then reviewed with pt  by my nurse highlighting any  changes in therapy recommended at today's visit to their plan of care.

## 2019-07-11 ENCOUNTER — Telehealth (HOSPITAL_COMMUNITY): Payer: Self-pay

## 2019-07-11 NOTE — Telephone Encounter (Signed)
New message   Just an FYI. We have made several attempts to contact this patient including sending a letter to schedule or reschedule their echocardiogram. We will be removing the patient from the echo WQ.   8.21.20 @ 10:25am call home # listed - not in service - call cell phone # lm on home vm - Isabella Buchanan  8.12.20 @  2:38pm lm on home vm Isabella Buchanan  06/12/19 home number is not in service-number listed to leave detail mess. -wrong number Isabella Buchanan 6.26.20 mail reminder letter Isabella Buchanan

## 2019-07-30 ENCOUNTER — Ambulatory Visit: Payer: Medicaid Other | Admitting: Internal Medicine

## 2019-07-31 ENCOUNTER — Ambulatory Visit: Payer: Medicaid Other | Admitting: Internal Medicine

## 2019-08-15 ENCOUNTER — Ambulatory Visit: Payer: Medicaid Other | Admitting: Internal Medicine

## 2019-11-15 ENCOUNTER — Encounter (HOSPITAL_COMMUNITY): Payer: Self-pay | Admitting: Emergency Medicine

## 2019-11-15 ENCOUNTER — Emergency Department (HOSPITAL_COMMUNITY): Payer: Medicaid Other

## 2019-11-15 ENCOUNTER — Inpatient Hospital Stay (HOSPITAL_COMMUNITY)
Admission: EM | Admit: 2019-11-15 | Discharge: 2019-11-15 | DRG: 872 | Discharge status: Against Medical Advice | Payer: Medicaid Other | Attending: Internal Medicine | Admitting: Internal Medicine

## 2019-11-15 ENCOUNTER — Other Ambulatory Visit: Payer: Self-pay

## 2019-11-15 DIAGNOSIS — Z87442 Personal history of urinary calculi: Secondary | ICD-10-CM | POA: Diagnosis not present

## 2019-11-15 DIAGNOSIS — Z807 Family history of other malignant neoplasms of lymphoid, hematopoietic and related tissues: Secondary | ICD-10-CM

## 2019-11-15 DIAGNOSIS — N12 Tubulo-interstitial nephritis, not specified as acute or chronic: Secondary | ICD-10-CM

## 2019-11-15 DIAGNOSIS — E7800 Pure hypercholesterolemia, unspecified: Secondary | ICD-10-CM | POA: Diagnosis present

## 2019-11-15 DIAGNOSIS — N3010 Interstitial cystitis (chronic) without hematuria: Secondary | ICD-10-CM | POA: Diagnosis present

## 2019-11-15 DIAGNOSIS — Z880 Allergy status to penicillin: Secondary | ICD-10-CM | POA: Diagnosis not present

## 2019-11-15 DIAGNOSIS — Z833 Family history of diabetes mellitus: Secondary | ICD-10-CM

## 2019-11-15 DIAGNOSIS — Z5329 Procedure and treatment not carried out because of patient's decision for other reasons: Secondary | ICD-10-CM | POA: Diagnosis not present

## 2019-11-15 DIAGNOSIS — E785 Hyperlipidemia, unspecified: Secondary | ICD-10-CM | POA: Diagnosis present

## 2019-11-15 DIAGNOSIS — Z87440 Personal history of urinary (tract) infections: Secondary | ICD-10-CM | POA: Diagnosis not present

## 2019-11-15 DIAGNOSIS — E669 Obesity, unspecified: Secondary | ICD-10-CM | POA: Diagnosis present

## 2019-11-15 DIAGNOSIS — F17210 Nicotine dependence, cigarettes, uncomplicated: Secondary | ICD-10-CM | POA: Diagnosis present

## 2019-11-15 DIAGNOSIS — I10 Essential (primary) hypertension: Secondary | ICD-10-CM | POA: Diagnosis present

## 2019-11-15 DIAGNOSIS — Z8249 Family history of ischemic heart disease and other diseases of the circulatory system: Secondary | ICD-10-CM

## 2019-11-15 DIAGNOSIS — Z79891 Long term (current) use of opiate analgesic: Secondary | ICD-10-CM

## 2019-11-15 DIAGNOSIS — A419 Sepsis, unspecified organism: Secondary | ICD-10-CM | POA: Diagnosis present

## 2019-11-15 DIAGNOSIS — Z9049 Acquired absence of other specified parts of digestive tract: Secondary | ICD-10-CM

## 2019-11-15 DIAGNOSIS — E282 Polycystic ovarian syndrome: Secondary | ICD-10-CM | POA: Diagnosis present

## 2019-11-15 DIAGNOSIS — K760 Fatty (change of) liver, not elsewhere classified: Secondary | ICD-10-CM | POA: Diagnosis present

## 2019-11-15 DIAGNOSIS — N136 Pyonephrosis: Secondary | ICD-10-CM | POA: Diagnosis present

## 2019-11-15 DIAGNOSIS — Z888 Allergy status to other drugs, medicaments and biological substances status: Secondary | ICD-10-CM | POA: Diagnosis not present

## 2019-11-15 DIAGNOSIS — F419 Anxiety disorder, unspecified: Secondary | ICD-10-CM | POA: Diagnosis present

## 2019-11-15 DIAGNOSIS — Z882 Allergy status to sulfonamides status: Secondary | ICD-10-CM

## 2019-11-15 DIAGNOSIS — Z885 Allergy status to narcotic agent status: Secondary | ICD-10-CM

## 2019-11-15 DIAGNOSIS — Z20828 Contact with and (suspected) exposure to other viral communicable diseases: Secondary | ICD-10-CM | POA: Diagnosis present

## 2019-11-15 DIAGNOSIS — Z7984 Long term (current) use of oral hypoglycemic drugs: Secondary | ICD-10-CM

## 2019-11-15 DIAGNOSIS — M549 Dorsalgia, unspecified: Secondary | ICD-10-CM | POA: Diagnosis present

## 2019-11-15 DIAGNOSIS — K219 Gastro-esophageal reflux disease without esophagitis: Secondary | ICD-10-CM | POA: Diagnosis present

## 2019-11-15 DIAGNOSIS — Z806 Family history of leukemia: Secondary | ICD-10-CM

## 2019-11-15 DIAGNOSIS — Z79899 Other long term (current) drug therapy: Secondary | ICD-10-CM

## 2019-11-15 DIAGNOSIS — M797 Fibromyalgia: Secondary | ICD-10-CM | POA: Diagnosis present

## 2019-11-15 DIAGNOSIS — Z6834 Body mass index (BMI) 34.0-34.9, adult: Secondary | ICD-10-CM | POA: Diagnosis not present

## 2019-11-15 DIAGNOSIS — G8929 Other chronic pain: Secondary | ICD-10-CM | POA: Diagnosis present

## 2019-11-15 DIAGNOSIS — G2581 Restless legs syndrome: Secondary | ICD-10-CM | POA: Diagnosis present

## 2019-11-15 LAB — COMPREHENSIVE METABOLIC PANEL
ALT: 18 U/L (ref 0–44)
AST: 25 U/L (ref 15–41)
Albumin: 3.8 g/dL (ref 3.5–5.0)
Alkaline Phosphatase: 77 U/L (ref 38–126)
Anion gap: 13 (ref 5–15)
BUN: 11 mg/dL (ref 6–20)
CO2: 24 mmol/L (ref 22–32)
Calcium: 8.8 mg/dL — ABNORMAL LOW (ref 8.9–10.3)
Chloride: 94 mmol/L — ABNORMAL LOW (ref 98–111)
Creatinine, Ser: 0.77 mg/dL (ref 0.44–1.00)
GFR calc Af Amer: >60 mL/min (ref >60)
GFR calc non Af Amer: >60 mL/min (ref >60)
Glucose, Bld: 158 mg/dL — ABNORMAL HIGH (ref 70–99)
Potassium: 3.7 mmol/L (ref 3.5–5.1)
Sodium: 131 mmol/L — ABNORMAL LOW (ref 135–145)
Total Bilirubin: 1 mg/dL (ref 0.3–1.2)
Total Protein: 7.4 g/dL (ref 6.5–8.1)

## 2019-11-15 LAB — URINALYSIS, ROUTINE W REFLEX MICROSCOPIC
Bilirubin Urine: NEGATIVE
Glucose, UA: NEGATIVE mg/dL
Ketones, ur: NEGATIVE mg/dL
Nitrite: POSITIVE — AB
Protein, ur: 30 mg/dL — AB
Specific Gravity, Urine: 1.014 (ref 1.005–1.030)
WBC, UA: >50 WBC/hpf — ABNORMAL HIGH (ref 0–5)
pH: 7 (ref 5.0–8.0)

## 2019-11-15 LAB — CBC WITH DIFFERENTIAL/PLATELET
Abs Immature Granulocytes: 0.06 10*3/uL (ref 0.00–0.07)
Basophils Absolute: 0.1 10*3/uL (ref 0.0–0.1)
Basophils Relative: 1 %
Eosinophils Absolute: 0.1 10*3/uL (ref 0.0–0.5)
Eosinophils Relative: 1 %
HCT: 42.1 % (ref 36.0–46.0)
Hemoglobin: 13.4 g/dL (ref 12.0–15.0)
Immature Granulocytes: 0 %
Lymphocytes Relative: 8 %
Lymphs Abs: 1.4 10*3/uL (ref 0.7–4.0)
MCH: 28 pg (ref 26.0–34.0)
MCHC: 31.8 g/dL (ref 30.0–36.0)
MCV: 87.9 fL (ref 80.0–100.0)
Monocytes Absolute: 1 10*3/uL (ref 0.1–1.0)
Monocytes Relative: 6 %
Neutro Abs: 14.3 10*3/uL — ABNORMAL HIGH (ref 1.7–7.7)
Neutrophils Relative %: 84 %
Platelets: 265 10*3/uL (ref 150–400)
RBC: 4.79 MIL/uL (ref 3.87–5.11)
RDW: 14.8 % (ref 11.5–15.5)
WBC: 16.9 10*3/uL — ABNORMAL HIGH (ref 4.0–10.5)
nRBC: 0 % (ref 0.0–0.2)

## 2019-11-15 LAB — HIV ANTIBODY (ROUTINE TESTING W REFLEX): HIV Screen 4th Generation wRfx: NONREACTIVE

## 2019-11-15 LAB — PROCALCITONIN: Procalcitonin: 0.38 ng/mL

## 2019-11-15 LAB — PROTIME-INR
INR: 1.1 (ref 0.8–1.2)
Prothrombin Time: 13.9 seconds (ref 11.4–15.2)

## 2019-11-15 LAB — LACTIC ACID, PLASMA
Lactic Acid, Venous: 3.1 mmol/L (ref 0.5–1.9)
Lactic Acid, Venous: 2.3 mmol/L (ref 0.5–1.9)

## 2019-11-15 LAB — POC URINE PREG, ED: Preg Test, Ur: NEGATIVE

## 2019-11-15 LAB — APTT: aPTT: 42 seconds — ABNORMAL HIGH (ref 24–36)

## 2019-11-15 MED ORDER — MOMETASONE FURO-FORMOTEROL FUM 200-5 MCG/ACT IN AERO: 2.0000 | INHALATION_SPRAY | Freq: Two times a day (BID) | RESPIRATORY_TRACT | Status: DC

## 2019-11-15 MED ORDER — FENOFIBRATE 160 MG PO TABS
160.0000 mg | ORAL_TABLET | Freq: Every day | ORAL | Status: DC
  Filled 2019-11-15 (×3): qty 1

## 2019-11-15 MED ORDER — SODIUM CHLORIDE 0.9 % IV BOLUS
1000.0000 mL | Freq: Once | INTRAVENOUS | Status: AC
  Administered 2019-11-15: 1000 mL via INTRAVENOUS

## 2019-11-15 MED ORDER — ACETAMINOPHEN 650 MG RE SUPP: 650.0000 mg | Freq: Four times a day (QID) | RECTAL | Status: DC | PRN

## 2019-11-15 MED ORDER — NICOTINE 14 MG/24HR TD PT24
14.0000 mg | MEDICATED_PATCH | Freq: Every day | TRANSDERMAL | Status: DC
  Administered 2019-11-15: 14 mg via TRANSDERMAL
  Filled 2019-11-15: qty 1

## 2019-11-15 MED ORDER — TIZANIDINE HCL 4 MG PO TABS
4.0000 mg | ORAL_TABLET | Freq: Two times a day (BID) | ORAL | Status: DC | PRN
  Filled 2019-11-15: qty 1

## 2019-11-15 MED ORDER — ACETAMINOPHEN 500 MG PO TABS
1000.0000 mg | ORAL_TABLET | Freq: Once | ORAL | Status: AC
  Administered 2019-11-15: 1000 mg via ORAL
  Filled 2019-11-15: qty 2

## 2019-11-15 MED ORDER — SODIUM CHLORIDE 0.9 % IV SOLN: INTRAVENOUS | Status: DC

## 2019-11-15 MED ORDER — GABAPENTIN 300 MG PO CAPS: 300.0000 mg | ORAL_CAPSULE | Freq: Two times a day (BID) | ORAL | Status: DC

## 2019-11-15 MED ORDER — ONDANSETRON HCL 4 MG/2ML IJ SOLN: 4.0000 mg | Freq: Four times a day (QID) | INTRAMUSCULAR | Status: DC | PRN

## 2019-11-15 MED ORDER — PRAMIPEXOLE DIHYDROCHLORIDE 1.5 MG PO TABS
1.5000 mg | ORAL_TABLET | Freq: Every day | ORAL | Status: DC
  Filled 2019-11-15 (×2): qty 1

## 2019-11-15 MED ORDER — FUROSEMIDE 40 MG PO TABS: 20.0000 mg | ORAL_TABLET | ORAL | Status: DC | PRN

## 2019-11-15 MED ORDER — ONDANSETRON HCL 4 MG/2ML IJ SOLN
4.0000 mg | Freq: Once | INTRAMUSCULAR | Status: AC
  Administered 2019-11-15: 4 mg via INTRAVENOUS
  Filled 2019-11-15: qty 2

## 2019-11-15 MED ORDER — IOHEXOL 300 MG/ML  SOLN: 100.0000 mL | Freq: Once | INTRAMUSCULAR | Status: DC | PRN

## 2019-11-15 MED ORDER — ACETAMINOPHEN 325 MG PO TABS
650.0000 mg | ORAL_TABLET | Freq: Four times a day (QID) | ORAL | Status: DC | PRN
  Administered 2019-11-15: 650 mg via ORAL
  Filled 2019-11-15: qty 2

## 2019-11-15 MED ORDER — SODIUM CHLORIDE 0.9 % IV SOLN
1.0000 g | Freq: Once | INTRAVENOUS | Status: AC
  Administered 2019-11-15: 1 g via INTRAVENOUS
  Filled 2019-11-15: qty 1

## 2019-11-15 MED ORDER — ENOXAPARIN SODIUM 40 MG/0.4ML ~~LOC~~ SOLN
40.0000 mg | SUBCUTANEOUS | Status: DC
  Administered 2019-11-15: 14:00:00 40 mg via SUBCUTANEOUS
  Filled 2019-11-15: qty 0.4

## 2019-11-15 MED ORDER — ONDANSETRON HCL 4 MG PO TABS: 4.0000 mg | ORAL_TABLET | Freq: Four times a day (QID) | ORAL | Status: DC | PRN

## 2019-11-15 MED ORDER — IRBESARTAN 75 MG PO TABS
75.0000 mg | ORAL_TABLET | Freq: Every day | ORAL | Status: DC
  Filled 2019-11-15 (×3): qty 1

## 2019-11-15 MED ORDER — MONTELUKAST SODIUM 10 MG PO TABS: 10.0000 mg | ORAL_TABLET | Freq: Every day | ORAL | Status: DC

## 2019-11-15 MED ORDER — ATORVASTATIN CALCIUM 10 MG PO TABS: 20.0000 mg | ORAL_TABLET | Freq: Every day | ORAL | Status: DC

## 2019-11-15 MED ORDER — PANTOPRAZOLE SODIUM 40 MG PO TBEC: 40.0000 mg | DELAYED_RELEASE_TABLET | Freq: Every day | ORAL | Status: DC

## 2019-11-15 MED ORDER — SODIUM CHLORIDE 0.9 % IV SOLN: 1.0000 g | Freq: Three times a day (TID) | INTRAVENOUS | Status: DC

## 2019-11-15 MED ORDER — CLONAZEPAM 0.5 MG PO TABS: 0.2500 mg | ORAL_TABLET | Freq: Two times a day (BID) | ORAL | Status: DC | PRN

## 2019-11-15 NOTE — ED Notes (Signed)
Date and time results received: 11/15/19 8:54 AM  (use smartphrase ".now" to insert current time)  Test: Lactic Critical Value: 3.1  Name of Provider Notified: Sabra Heck  Orders Received? Or Actions Taken?: Orders Received - See Orders for details

## 2019-11-15 NOTE — ED Notes (Signed)
Date and time results received: 11/15/19 11:23 PM  (use smartphrase ".now" to insert current time)  Test: blood culture Critical Value: gram neg rods in blue top  Name of Provider Notified: bodenheimer   Orders Received? Or Actions Taken?: none at this time. Provider to call pt

## 2019-11-15 NOTE — Discharge Summary (Signed)
Physician Discharge Summary  ROBEN TATSCH KXF:[id number] DOB: 04-09-1981 DOA: 11/15/2019  PCP: Ladora Daniel, PA-C  Admit date: 11/15/2019  Discharge date: 11/15/2019  Admitted From: Home  Disposition:  AMA Discharge  CODE STATUS: Full  Brief/Interim Summary: Per HPI: Isabella Buchanan is a 38 y.o. female with medical history significant for nephrolithiasis with prior stenting, recurrent UTIs with associated chronic back and flank pain, PCOS, dyslipidemia, hypertension, tobacco abuse, and obesity who presented to the ED with worsening dysuria as well as some nausea and fevers.  She is noted to have history of recurrent UTIs and used the E-visit system 48 hours ago to obtain ciprofloxacin for her urinary tract infection.  After a couple doses she became quite nauseated with some worsening flank pain and fever and called EMS.  She denies any exacerbating or relieving factors and has not had any emesis.  She denies any diarrhea, but does have some suprapubic abdominal pain.  No shortness of breath, cough, chest pain, or other symptoms currently noted.  She continues to smoke 2 packs/day.  Patient was admitted with sepsis secondary to UTI and was started on Merrem. She had no acute CT findings aside from some mild left-sided hydronephrosis with nephrolithiasis. She has decided to leave AMA in stable condition without explanation.  Discharge Diagnoses:  Active Problems:   Sepsis (HCC)     Allergies  Allergen Reactions  . Omnicef [Cefdinir] Anaphylaxis    Swelling of tongue  . Amoxicillin Nausea And Vomiting and Rash  . Celebrex [Celecoxib] Nausea And Vomiting  . Codeine Nausea Only    Stomach aches  . Penicillins Nausea And Vomiting and Rash    .Has patient had a PCN reaction causing immediate rash, facial/tongue/throat swelling, SOB or lightheadedness with hypotension: unknown Has patient had a PCN reaction causing severe rash involving mucus membranes or skin necrosis: unknown Has  patient had a PCN reaction that required hospitalization: unknown Has patient had a PCN reaction occurring within the last 10 years: no If all of the above answers are "NO", then may proceed with Cephalosporin use.   . Sulfa Antibiotics Nausea And Vomiting    Consultations:  None   Procedures/Studies: CT ABDOMEN PELVIS WO CONTRAST  Result Date: 11/15/2019 CLINICAL DATA:  Fever and flank pain starting this morning EXAM: CT ABDOMEN AND PELVIS WITHOUT CONTRAST TECHNIQUE: Multidetector CT imaging of the abdomen and pelvis was performed following the standard protocol without IV contrast. COMPARISON:  September 19, 2017 FINDINGS: Lower chest: No acute abnormality. Hepatobiliary: Diffuse low density of liver is identified without focal liver lesion. Patient status post prior cholecystectomy. The biliary tree is normal. Pancreas: Unremarkable. No pancreatic ductal dilatation or surrounding inflammatory changes. Spleen: Normal in size without focal abnormality. Adrenals/Urinary Tract: The bilateral adrenal glands are normal. There is a 2 mm nonobstructing stone in a left upper pole calyx. There is mild left hydronephrosis but no focal discrete obstructing stone is identified in the left collecting system. The right kidney is normal. Bladder is normal. Stomach/Bowel: Stomach is within normal limits. Appendix appears normal. No evidence of bowel wall thickening, distention, or inflammatory changes. Vascular/Lymphatic: Aortic atherosclerosis. No enlarged abdominal or pelvic lymph nodes. Reproductive: Uterus and bilateral adnexa are unremarkable. Other: None. Musculoskeletal: No acute abnormality. IMPRESSION: 1. Mild left hydronephrosis but no focal discrete obstructing stone is identified in the left collecting system. 2. Nonobstructing stone in the left upper pole calyx. 3. Fatty infiltration of liver. Electronically Signed   By: Gabriel Carina.D.  On: 11/15/2019 10:36     Discharge Exam: Vitals:    11/15/19 1000 11/15/19 1416  BP: 110/63 139/88  Pulse: (!) 105 (!) 112  Resp:  15  Temp:  97.9 F (36.6 C)  SpO2: 99% 100%   Vitals:   11/15/19 0700 11/15/19 0949 11/15/19 1000 11/15/19 1416  BP: 119/78 109/61 110/63 139/88  Pulse: (!) 117 (!) 102 (!) 105 (!) 112  Resp:    15  Temp:  99.2 F (37.3 C)  97.9 F (36.6 C)  TempSrc:  Oral  Oral  SpO2: 96% 98% 99% 100%  Weight:      Height:        General: Pt is alert, awake, not in acute distress Cardiovascular: RRR, S1/S2 +, no rubs, no gallops Respiratory: CTA bilaterally, no wheezing, no rhonchi Abdominal: Soft, NT, ND, bowel sounds + Extremities: no edema, no cyanosis    The results of significant diagnostics from this hospitalization (including imaging, microbiology, ancillary and laboratory) are listed below for reference.     Microbiology: Recent Results (from the past 240 hour(s))  Blood Culture (routine x 2)     Status: None (Preliminary result)   Collection Time: 11/15/19  7:14 AM   Specimen: Right Antecubital; Blood  Result Value Ref Range Status   Specimen Description   Final    RIGHT ANTECUBITAL BOTTLES DRAWN AEROBIC AND ANAEROBIC   Special Requests   Final    Blood Culture adequate volume Performed at [facility], [address] [place], [place] [address]    Culture PENDING  Incomplete   Report Status PENDING  Incomplete  Blood Culture (routine x 2)     Status: None (Preliminary result)   Collection Time: 11/15/19  7:19 AM   Specimen: Left Antecubital; Blood  Result Value Ref Range Status   Specimen Description   Final    LEFT ANTECUBITAL BOTTLES DRAWN AEROBIC AND ANAEROBIC   Special Requests   Final    Blood Culture adequate volume Performed at [facility], [address] [place], [place] [address]    Culture PENDING  Incomplete   Report Status PENDING  Incomplete     Labs: BNP (last 3 results) No results for input(s): BNP in the last 8760 hours. Basic Metabolic Panel: Recent Labs  Lab  11/15/19 0714  NA 131*  K 3.7  CL 94*  CO2 24  GLUCOSE 158*  BUN 11  CREATININE 0.77  CALCIUM 8.8*   Liver Function Tests: Recent Labs  Lab 11/15/19 0714  AST 25  ALT 18  ALKPHOS 77  BILITOT 1.0  PROT 7.4  ALBUMIN 3.8   No results for input(s): LIPASE, AMYLASE in the last 168 hours. No results for input(s): AMMONIA in the last 168 hours. CBC: Recent Labs  Lab 11/15/19 0714  WBC 16.9*  NEUTROABS 14.3*  HGB 13.4  HCT 42.1  MCV 87.9  PLT 265   Cardiac Enzymes: No results for input(s): CKTOTAL, CKMB, CKMBINDEX, TROPONINI in the last 168 hours. BNP: Invalid input(s): POCBNP CBG: No results for input(s): GLUCAP in the last 168 hours. D-Dimer No results for input(s): DDIMER in the last 72 hours. Hgb A1c No results for input(s): HGBA1C in the last 72 hours. Lipid Profile No results for input(s): CHOL, HDL, LDLCALC, TRIG, CHOLHDL, LDLDIRECT in the last 72 hours. Thyroid function studies No results for input(s): TSH, T4TOTAL, T3FREE, THYROIDAB in the last 72 hours.  Invalid input(s): FREET3 Anemia work up No results for input(s): VITAMINB12, FOLATE, FERRITIN, TIBC, IRON,  RETICCTPCT in the last 72 hours. Urinalysis    Component Value Date/Time   COLORURINE YELLOW 11/15/2019 0735   APPEARANCEUR HAZY (A) 11/15/2019 0735   LABSPEC 1.014 11/15/2019 0735   PHURINE 7.0 11/15/2019 0735   GLUCOSEU NEGATIVE 11/15/2019 0735   HGBUR SMALL (A) 11/15/2019 0735   BILIRUBINUR NEGATIVE 11/15/2019 0735   KETONESUR NEGATIVE 11/15/2019 0735   PROTEINUR 30 (A) 11/15/2019 0735   UROBILINOGEN 0.2 04/18/2015 2030   NITRITE POSITIVE (A) 11/15/2019 0735   LEUKOCYTESUR LARGE (A) 11/15/2019 0735   Sepsis Labs Invalid input(s): PROCALCITONIN,  WBC,  LACTICIDVEN Microbiology Recent Results (from the past 240 hour(s))  Blood Culture (routine x 2)     Status: None (Preliminary result)   Collection Time: 11/15/19  7:14 AM   Specimen: Right Antecubital; Blood  Result Value Ref Range  Status   Specimen Description   Final    RIGHT ANTECUBITAL BOTTLES DRAWN AEROBIC AND ANAEROBIC   Special Requests   Final    Blood Culture adequate volume Performed at [facility], [address] [place], [place] [address]    Culture PENDING  Incomplete   Report Status PENDING  Incomplete  Blood Culture (routine x 2)     Status: None (Preliminary result)   Collection Time: 11/15/19  7:19 AM   Specimen: Left Antecubital; Blood  Result Value Ref Range Status   Specimen Description   Final    LEFT ANTECUBITAL BOTTLES DRAWN AEROBIC AND ANAEROBIC   Special Requests   Final    Blood Culture adequate volume Performed at [facility], [address] [place], [place] [address]    Culture PENDING  Incomplete   Report Status PENDING  Incomplete     Time coordinating discharge: 35 minutes  SIGNED:   Erick BlinksPratik D Candus Braud, DO [facility] 11/15/2019, 4:53 PM  If 7PM-7AM, please contact night-coverage [email or web address]

## 2019-11-15 NOTE — ED Provider Notes (Signed)
[facility] EMERGENCY DEPARTMENT Provider Note   CSN: [id number] Arrival date & time: 11/15/19  0645     History Chief Complaint  Patient presents with  . Fever    Isabella Buchanan is a 38 y.o. female.  HPI   This patient is a 38 year old female, she has a history of some chronic back and flank pain however she also has a known history of kidney stones, urinary tract infections and has even followed up with urology requiring urinary stenting in the past.  She has had multiple kidney stones removed, she has had pyelonephritis in the presence of kidney stones and unfortunately over the last week is started to develop some dysuria.  48 hours ago she used the E-visit system and obtained an antibiotic, she has been taking Cipro and after 2 doses became nauseated and was unable to tolerate it anymore.  She presents today with continual nausea, flank pain, fever and is tachycardic by paramedic transport.  She was noted to have abnormal vital signs by the paramedics.  Symptoms are persistent, gradually worsening and became severe this morning.  She was unable to eat any Christmas dinner yesterday because of her nausea.  She has not had any recent urinary tract manipulation Past Medical History:  Diagnosis Date  . Anxiety   . Chronic back pain   . Chronic flank pain   . Chronic pain   . DOE (dyspnea on exertion)   . Fibromyalgia   . GERD (gastroesophageal reflux disease)   . Headache(784.0)    mygrains  . Hiatal hernia   . History of electroencephalogram 11/2013   normal EEG, "psychogenic nonepileptic spell" during EEG  . Hypertension   . IBS (irritable bowel syndrome)   . Interstitial cystitis   . Kidney stones   . Polycystic ovarian syndrome   . PONV (postoperative nausea and vomiting)   . Restless leg   . Seizures (HCC)   . Swelling     Patient Active Problem List   Diagnosis Date Noted  . Asthmatic bronchitis , chronic (HCC) 05/12/2019  . Ureteral calculus, left 08/06/2017   . Morbid obesity (HCC) complicated by hyperlipid/hbp 08/02/2016  . Hypertriglyceridemia without hypercholesterolemia 08/02/2016  . Cigarette smoker 08/02/2016  . Essential hypertriglyceridemia 06/30/2015  . Combined fat and carbohydrate induced hyperlipemia 06/30/2015  . Nicotine addiction 06/28/2015  . Cyclic vomiting syndrome 06/28/2015  . Avitaminosis D 06/28/2015  . Essential hypertension 05/04/2015  . Degeneration of intervertebral disc of lumbar region 02/05/2015  . Anxiety, generalized 02/05/2015  . Community acquired pneumonia 07/17/2014  . CAP (community acquired pneumonia) 07/17/2014  . Sepsis (HCC) 07/17/2014  . Encephalopathy 07/17/2014  . Acute respiratory failure (HCC) 07/17/2014  . Seizure (HCC) 12/13/2013  . Seizures (HCC) 12/13/2013  . History of IBS 12/13/2013  . Fibromyalgia   . Chronic back pain   . Interstitial cystitis   . Polycystic ovarian syndrome     Past Surgical History:  Procedure Laterality Date  . CHOLECYSTECTOMY    . CYSTOSCOPY W/ URETERAL STENT PLACEMENT Left 08/06/2017   Procedure: CYSTOSCOPY WITH RETROGRADE PYELOGRAM/URETERAL STENT PLACEMENT;  Surgeon: Marcine Matar, MD;  Location: [facility];  Service: Urology;  Laterality: Left;  . CYSTOSCOPY/URETEROSCOPY/HOLMIUM LASER/STENT PLACEMENT Left 08/21/2017   Procedure: CYSTOSCOPY,LEFT URETERAL STENT REMOVAL, LEFT URETEROSCOPY, STONE BASKET EXTRACTION AND LEFT URETERAL STENT PLACEMENT;  Surgeon: Marcine Matar, MD;  Location: [facility];  Service: Urology;  Laterality: Left;  . ESOPHAGOGASTRODUODENOSCOPY ENDOSCOPY    . TONSILLECTOMY    . TUBAL LIGATION    .  TYMPANOPLASTY       OB History    Gravida  2   Para  2   Term      Preterm  2   AB      Living  2     SAB      TAB      Ectopic      Multiple      Live Births              Family History  Problem Relation Age of Onset  . Hypertension Father   . Diabetes Father   . Seizures Father   . Allergies Father   . Cancer  Other   . Diabetes Other   . Lymphoma Maternal Grandmother   . Heart disease Maternal Grandmother   . Leukemia Maternal Grandfather     Social History   Tobacco Use  . Smoking status: Current Every Day Smoker    Packs/day: 1.00    Years: 23.00    Pack years: 23.00    Types: Cigarettes  . Smokeless tobacco: Never Used  Substance Use Topics  . Alcohol use: Yes    Comment: rarely  . Drug use: No    Home Medications Prior to Admission medications   Medication Sig Start Date End Date Taking? Authorizing Provider  atorvastatin (LIPITOR) 20 MG tablet Take 20 mg by mouth daily.    [provider]  clonazePAM (KLONOPIN) 0.5 MG tablet Take 0.25-0.5 mg by mouth 2 (two) times daily as needed. 08/08/17   [provider]  esomeprazole (NEXIUM) 40 MG capsule Take 40 mg by mouth daily at 12 noon.    [provider]  famotidine (PEPCID) 20 MG tablet One after supper 05/12/19   Nyoka Cowden, MD  fenofibrate 160 MG tablet Take 160 mg by mouth daily.    [provider]  furosemide (LASIX) 20 MG tablet Take 20 mg by mouth as needed.    [provider]  gabapentin (NEURONTIN) 300 MG capsule Take 300 mg by mouth 2 (two) times a day. 05/02/19   [provider]  HYDROcodone-acetaminophen (NORCO/VICODIN) 5-325 MG tablet Take 1 tablet by mouth every 6 (six) hours as needed for moderate pain.    [provider]  metFORMIN (GLUCOPHAGE-XR) 500 MG 24 hr tablet Take 1 tablet by mouth daily. 04/15/19   [provider]  mometasone-formoterol (DULERA) 200-5 MCG/ACT AERO Inhale 2 puffs into the lungs 2 (two) times daily. 07/02/19   Nyoka Cowden, MD  montelukast (SINGULAIR) 10 MG tablet Take 10 mg by mouth at bedtime.    [provider]  ondansetron (ZOFRAN) 4 MG tablet Take 1 tablet (4 mg total) by mouth every 6 (six) hours as needed for nausea or vomiting. 10/12/17   Lavera Guise, MD  pramipexole (MIRAPEX) 1 MG tablet Take 1.5 mg by  mouth at bedtime.     [provider]  tiZANidine (ZANAFLEX) 4 MG tablet Take 4 mg by mouth 2 (two) times daily as needed for muscle spasms.    [provider]  valsartan (DIOVAN) 80 MG tablet Take 80 mg by mouth daily.    [provider]    Allergies    Omnicef [cefdinir], Amoxicillin, Celebrex [celecoxib], Codeine, Penicillins, and Sulfa antibiotics  Review of Systems   Review of Systems  All other systems reviewed and are negative.   Physical Exam Updated Vital Signs BP 127/82 (BP Location: Right Arm)   Pulse (!) 120  Temp (!) 100.8 F (38.2 C) (Oral)   Resp 18   Ht 1.676 m (5\' 6" )   Wt 97.1 kg   LMP 10/21/2019   SpO2 97%   BMI 34.54 kg/m   Physical Exam Vitals and nursing note reviewed.  Constitutional:      General: She is not in acute distress.    Appearance: She is well-developed. She is ill-appearing.  HENT:     Head: Normocephalic and atraumatic.     Mouth/Throat:     Pharynx: No oropharyngeal exudate.  Eyes:     General: No scleral icterus.       Right eye: No discharge.        Left eye: No discharge.     Conjunctiva/sclera: Conjunctivae normal.     Pupils: Pupils are equal, round, and reactive to light.  Neck:     Thyroid: No thyromegaly.     Vascular: No JVD.  Cardiovascular:     Rate and Rhythm: Regular rhythm. Tachycardia present.     Heart sounds: Normal heart sounds. No murmur. No friction rub. No gallop.      Comments: Heart rate of approximately 120 Pulmonary:     Effort: Pulmonary effort is normal. No respiratory distress.     Breath sounds: Normal breath sounds. No wheezing or rales.  Abdominal:     General: Bowel sounds are normal. There is no distension.     Palpations: Abdomen is soft. There is no mass.     Tenderness: There is abdominal tenderness.     Comments: The patient has tenderness to palpation in the suprapubic region as well as left upper quadrant and left CVA tenderness  Musculoskeletal:         General: No tenderness. Normal range of motion.     Cervical back: Normal range of motion and neck supple.  Lymphadenopathy:     Cervical: No cervical adenopathy.  Skin:    General: Skin is warm and dry.     Findings: No erythema or rash.  Neurological:     Mental Status: She is alert.     Coordination: Coordination normal.     Comments: Normal level of alertness speech memory and coordination.  Psychiatric:        Behavior: Behavior normal.     ED Results / Procedures / Treatments   Labs (all labs ordered are listed, but only abnormal results are displayed) Labs Reviewed  CULTURE, BLOOD (ROUTINE X 2)  CULTURE, BLOOD (ROUTINE X 2)  URINE CULTURE  LACTIC ACID, PLASMA  LACTIC ACID, PLASMA  COMPREHENSIVE METABOLIC PANEL  CBC WITH DIFFERENTIAL/PLATELET  APTT  PROTIME-INR  URINALYSIS, ROUTINE W REFLEX MICROSCOPIC  PROCALCITONIN  POC URINE PREG, ED    EKG None  Radiology CT ABDOMEN PELVIS WO CONTRAST  Result Date: 11/15/2019 CLINICAL DATA:  Fever and flank pain starting this morning EXAM: CT ABDOMEN AND PELVIS WITHOUT CONTRAST TECHNIQUE: Multidetector CT imaging of the abdomen and pelvis was performed following the standard protocol without IV contrast. COMPARISON:  September 19, 2017 FINDINGS: Lower chest: No acute abnormality. Hepatobiliary: Diffuse low density of liver is identified without focal liver lesion. Patient status post prior cholecystectomy. The biliary tree is normal. Pancreas: Unremarkable. No pancreatic ductal dilatation or surrounding inflammatory changes. Spleen: Normal in size without focal abnormality. Adrenals/Urinary Tract: The bilateral adrenal glands are normal. There is a 2 mm nonobstructing stone in a left upper pole calyx. There is mild left hydronephrosis but no focal discrete obstructing stone is identified in the  left collecting system. The right kidney is normal. Bladder is normal. Stomach/Bowel: Stomach is within normal limits. Appendix appears  normal. No evidence of bowel wall thickening, distention, or inflammatory changes. Vascular/Lymphatic: Aortic atherosclerosis. No enlarged abdominal or pelvic lymph nodes. Reproductive: Uterus and bilateral adnexa are unremarkable. Other: None. Musculoskeletal: No acute abnormality. IMPRESSION: 1. Mild left hydronephrosis but no focal discrete obstructing stone is identified in the left collecting system. 2. Nonobstructing stone in the left upper pole calyx. 3. Fatty infiltration of liver. Electronically Signed   By: Sherian ReinWei-Chen  Lin M.D.   On: 11/15/2019 10:36    Procedures .Critical Care Performed by: Eber HongMiller, Phyllis Whitefield, MD Authorized by: Eber HongMiller, Erhard Senske, MD   Critical care provider statement:    Critical care time (minutes):  35   Critical care time was exclusive of:  Separately billable procedures and treating other patients and teaching time   Critical care was necessary to treat or prevent imminent or life-threatening deterioration of the following conditions:  Sepsis   Critical care was time spent personally by me on the following activities:  Blood draw for specimens, development of treatment plan with patient or surrogate, discussions with consultants, evaluation of patient's response to treatment, examination of patient, obtaining history from patient or surrogate, ordering and performing treatments and interventions, ordering and review of laboratory studies, ordering and review of radiographic studies, pulse oximetry, re-evaluation of patient's condition and review of old charts   (including critical care time)  Medications Ordered in ED Medications  meropenem (MERREM) 1 g in sodium chloride 0.9 % 100 mL IVPB (has no administration in time range)  sodium chloride 0.9 % bolus 1,000 mL (has no administration in time range)  sodium chloride 0.9 % bolus 1,000 mL (has no administration in time range)  acetaminophen (TYLENOL) tablet 1,000 mg (has no administration in time range)  ondansetron  (ZOFRAN) injection 4 mg (has no administration in time range)    ED Course  I have reviewed the triage vital signs and the nursing notes.  Pertinent labs & imaging results that were available during my care of the patient were reviewed by me and considered in my medical decision making (see chart for details).  Clinical Course as of Nov 15 702  Sat Nov 15, 2019  0842 White blood cell count is 16,900, metabolic panel is pending, urinalysis reveals many bacteria and greater than 50 white blood cells with positive nitrites all confirmatory of a urinary tract infection.  IV fluids and antibiotics ordered   [BM]  0901 Lactic acid 3.1.  We will plan on admission to the hospital for sepsis   [BM]    Clinical Course User Index [BM] Eber HongMiller, Syrah Daughtrey, MD   MDM Rules/Calculators/A&P                      This patient appears septic likely from pyelonephritis given her symptoms.  History of frequent UTIs and flank pain lens itself so this diagnosis.  She is tachycardic, febrile, nauseated and vomiting.  She will need IV fluids, antiemetics, a CT scan of her abdomen and pelvis to evaluate for pyelonephritis abscess or infected kidney stones.  She will also need labs including CBC and metabolic panel.  She will get antiemetics, IV fluids and antibiotics.  She has multiple allergies which restricts her ability to have normal antibiotics.  She did not tolerate Cipro, according to the sepsis order set meropenem would be the next best option.  Code sepsis was activated secondary to fever,  tachycardia in the presence of symptoms suggestive of a urinary tract infection  The patient was not hypotensive, she did not have a lactic acid over 4, she was given antibiotics to cover for pyelonephritis and sepsis as well as some IV fluids.  The CT scan thankfully did not show any signs of abscess, she refused IV contrast but on a noncontrast scan there was no signs of obstructing stone despite some left-sided mild  hydroureter and nephrosis.  Appreciate the hospitalist for admitting this patient.  Final Clinical Impression(s) / ED Diagnoses Final diagnoses:  Sepsis without acute organ dysfunction, due to unspecified organism [facility])  Pyelonephritis of left kidney    Rx / DC Orders ED Discharge Orders    None       Eber Hong, MD 11/16/19 [phone]

## 2019-11-15 NOTE — ED Notes (Signed)
Patient left against medical advice with no explanation.  Patient signed ama paper.

## 2019-11-15 NOTE — ED Notes (Signed)
Patient transported back from CT 

## 2019-11-15 NOTE — Progress Notes (Signed)
Notified bedside nurse of need to draw lactic acid and blood cultures.;s

## 2019-11-15 NOTE — Progress Notes (Signed)
Pharmacy Antibiotic Note  Isabella Buchanan is a 38 y.o. female admitted on 11/15/2019 with UTI.  Pharmacy has been consulted for merrem dosing.  Plan: merrem 1gm iv q8h  Height: 5\' 6"  (167.6 cm) Weight: 214 lb (97.1 kg) IBW/kg (Calculated) : 59.3  Temp (24hrs), Avg:100 F (37.8 C), Min:99.2 F (37.3 C), Max:100.8 F (38.2 C)  Recent Labs  Lab 11/15/19 0714 11/15/19 0927  WBC 16.9*  --   CREATININE 0.77  --   LATICACIDVEN 3.1* 2.3*    Estimated Creatinine Clearance: 110.9 mL/min (by C-G formula based on SCr of 0.77 mg/dL).    Allergies  Allergen Reactions  . Omnicef [Cefdinir] Anaphylaxis    Swelling of tongue  . Amoxicillin Nausea And Vomiting and Rash  . Celebrex [Celecoxib] Nausea And Vomiting  . Codeine Nausea Only    Stomach aches  . Penicillins Nausea And Vomiting and Rash    .Has patient had a PCN reaction causing immediate rash, facial/tongue/throat swelling, SOB or lightheadedness with hypotension: unknown Has patient had a PCN reaction causing severe rash involving mucus membranes or skin necrosis: unknown Has patient had a PCN reaction that required hospitalization: unknown Has patient had a PCN reaction occurring within the last 10 years: no If all of the above answers are "NO", then may proceed with Cephalosporin use.   . Sulfa Antibiotics Nausea And Vomiting    Antimicrobials this admission: 12/26 merrem >>    Microbiology results: 12/26 BCx:  12/26 UCx:    Thank you for allowing pharmacy to be a part of this patient's care.  Donna Christen Cassi Jenne 11/15/2019 11:10 AM

## 2019-11-15 NOTE — ED Triage Notes (Signed)
Pt states she is on Cipro for UTI and began running a fever this morning.

## 2019-11-15 NOTE — H&P (Addendum)
History and Physical    Isabella Buchanan KGM:[id number] DOB: Jan 11, 1981 DOA: 11/15/2019  PCP: Nicholes Rough, PA-C   Patient coming from: Home  Chief Complaint: Fever/chills/nausea/dysuria  HPI: Isabella Buchanan is a 38 y.o. female with medical history significant for nephrolithiasis with prior stenting, recurrent UTIs with associated chronic back and flank pain, PCOS, dyslipidemia, hypertension, tobacco abuse, and obesity who presented to the ED with worsening dysuria as well as some nausea and fevers.  She is noted to have history of recurrent UTIs and used the E-visit system 48 hours ago to obtain ciprofloxacin for her urinary tract infection.  After a couple doses she became quite nauseated with some worsening flank pain and fever and called EMS.  She denies any exacerbating or relieving factors and has not had any emesis.  She denies any diarrhea, but does have some suprapubic abdominal pain.  No shortness of breath, cough, chest pain, or other symptoms currently noted.  She continues to smoke 2 packs/day.   ED Course: Vital signs stable aside from some mild tachycardia as well as temperature with 100.8 F.  She is noted to have leukocytosis at 16,900 and lactic acid of 3.1 with signs of UTI noted on urinalysis.  Her sodium was 131.  CT of the abdomen and pelvis with fatty infiltration of the liver and mild left-sided hydronephrosis with no signs of definitive obstruction noted.  There is also a nonobstructing left-sided stone in the renal pelvis on that side.  She has been started on Merrem due to multiple drug allergies.  She has also been given some IV fluid with some improvement in lactic acidosis noted.  Review of Systems: All others reviewed as noted above and otherwise negative.  Past Medical History:  Diagnosis Date  . Anxiety   . Chronic back pain   . Chronic flank pain   . Chronic pain   . DOE (dyspnea on exertion)   . Fibromyalgia   . GERD (gastroesophageal reflux disease)   .  Headache(784.0)    mygrains  . Hiatal hernia   . History of electroencephalogram 11/2013   normal EEG, "psychogenic nonepileptic spell" during EEG  . Hypertension   . IBS (irritable bowel syndrome)   . Interstitial cystitis   . Kidney stones   . Polycystic ovarian syndrome   . PONV (postoperative nausea and vomiting)   . Restless leg   . Seizures ([place])   . Swelling     Past Surgical History:  Procedure Laterality Date  . CHOLECYSTECTOMY    . CYSTOSCOPY W/ URETERAL STENT PLACEMENT Left 08/06/2017   Procedure: CYSTOSCOPY WITH RETROGRADE PYELOGRAM/URETERAL STENT PLACEMENT;  Surgeon: Franchot Gallo, MD;  Location: [facility];  Service: Urology;  Laterality: Left;  . CYSTOSCOPY/URETEROSCOPY/HOLMIUM LASER/STENT PLACEMENT Left 08/21/2017   Procedure: CYSTOSCOPY,LEFT URETERAL STENT REMOVAL, LEFT URETEROSCOPY, STONE BASKET EXTRACTION AND LEFT URETERAL STENT PLACEMENT;  Surgeon: Franchot Gallo, MD;  Location: [facility];  Service: Urology;  Laterality: Left;  . ESOPHAGOGASTRODUODENOSCOPY ENDOSCOPY    . TONSILLECTOMY    . TUBAL LIGATION    . TYMPANOPLASTY       reports that she has been smoking cigarettes. She has a 23.00 pack-year smoking history. She has never used smokeless tobacco. She reports current alcohol use. She reports that she does not use drugs.  Allergies  Allergen Reactions  . Omnicef [Cefdinir] Anaphylaxis    Swelling of tongue  . Amoxicillin Nausea And Vomiting and Rash  . Celebrex [Celecoxib] Nausea And Vomiting  . Codeine Nausea Only  Stomach aches  . Penicillins Nausea And Vomiting and Rash    .Has patient had a PCN reaction causing immediate rash, facial/tongue/throat swelling, SOB or lightheadedness with hypotension: unknown Has patient had a PCN reaction causing severe rash involving mucus membranes or skin necrosis: unknown Has patient had a PCN reaction that required hospitalization: unknown Has patient had a PCN reaction occurring within the last 10 years:  no If all of the above answers are "NO", then may proceed with Cephalosporin use.   . Sulfa Antibiotics Nausea And Vomiting    Family History  Problem Relation Age of Onset  . Hypertension Father   . Diabetes Father   . Seizures Father   . Allergies Father   . Cancer Other   . Diabetes Other   . Lymphoma Maternal Grandmother   . Heart disease Maternal Grandmother   . Leukemia Maternal Grandfather     Prior to Admission medications   Medication Sig Start Date End Date Taking? Authorizing Provider  atorvastatin (LIPITOR) 20 MG tablet Take 20 mg by mouth daily.   Yes [provider]  clonazePAM (KLONOPIN) 0.5 MG tablet Take 0.25-0.5 mg by mouth 2 (two) times daily as needed. 08/08/17  Yes [provider]  esomeprazole (NEXIUM) 40 MG capsule Take 40 mg by mouth daily at 12 noon.   Yes [provider]  famotidine (PEPCID) 20 MG tablet One after supper 05/12/19  Yes Nyoka CowdenWert, Michael B, MD  fenofibrate 160 MG tablet Take 160 mg by mouth daily.   Yes [provider]  gabapentin (NEURONTIN) 300 MG capsule Take 300 mg by mouth 2 (two) times a day. 05/02/19  Yes [provider]  HYDROcodone-acetaminophen (NORCO/VICODIN) 5-325 MG tablet Take 1 tablet by mouth every 6 (six) hours as needed for moderate pain.   Yes [provider]  metFORMIN (GLUCOPHAGE-XR) 500 MG 24 hr tablet Take 1 tablet by mouth daily. 04/15/19  Yes [provider]  mometasone-formoterol (DULERA) 200-5 MCG/ACT AERO Inhale 2 puffs into the lungs 2 (two) times daily. 07/02/19  Yes Nyoka CowdenWert, Michael B, MD  montelukast (SINGULAIR) 10 MG tablet Take 10 mg by mouth at bedtime.   Yes [provider]  ondansetron (ZOFRAN) 4 MG tablet Take 1 tablet (4 mg total) by mouth every 6 (six) hours as needed for nausea or vomiting. 10/12/17  Yes Lavera GuiseLiu, Dana Duo, MD  pramipexole (MIRAPEX) 1 MG tablet Take 1.5 mg by mouth at bedtime.    Yes [provider]  tiZANidine (ZANAFLEX) 4  MG tablet Take 4 mg by mouth 2 (two) times daily as needed for muscle spasms.   Yes [provider]  valsartan (DIOVAN) 80 MG tablet Take 80 mg by mouth daily.   Yes [provider]  furosemide (LASIX) 20 MG tablet Take 20 mg by mouth as needed.    [provider]    Physical Exam: Vitals:   11/15/19 0652 11/15/19 0700 11/15/19 0949 11/15/19 1000  BP: 127/82 119/78 109/61 110/63  Pulse: (!) 120 (!) 117 (!) 102 (!) 105  Resp: 18     Temp: (!) 100.8 F (38.2 C)  99.2 F (37.3 C)   TempSrc: Oral  Oral   SpO2: 97% 96% 98% 99%  Weight: 97.1 kg     Height: 5\' 6"  (1.676 m)       Constitutional: NAD, calm, comfortable, obese Vitals:   11/15/19 0652 11/15/19 0700 11/15/19 0949 11/15/19 1000  BP: 127/82 119/78 109/61 110/63  Pulse: (!) 120 (!) 117 Marland Kitchen(!)  102 (!) 105  Resp: 18     Temp: (!) 100.8 F (38.2 C)  99.2 F (37.3 C)   TempSrc: Oral  Oral   SpO2: 97% 96% 98% 99%  Weight: 97.1 kg     Height:  (1.676 m)      Eyes: lids and conjunctivae normal ENMT: Mucous membranes are moist.  Neck: normal, supple Respiratory: clear to auscultation bilaterally. Normal respiratory effort. No accessory muscle use.  Currently on room air.   Cardiovascular: Regular rate and rhythm, no murmurs. No extremity edema. Abdomen: Mild tenderness to palpation over suprapubic region, no distention. Bowel sounds positive.  Musculoskeletal:  No joint deformity upper and lower extremities.   Skin: no rashes, lesions, ulcers.  Psychiatric: Normal judgment and insight. Alert and oriented x 3. Normal mood.   Labs on Admission: I have personally reviewed following labs and imaging studies  CBC: Recent Labs  Lab 11/15/19 0714  WBC 16.9*  NEUTROABS 14.3*  HGB 13.4  HCT 42.1  MCV 87.9  PLT 265   Basic Metabolic Panel: Recent Labs  Lab 11/15/19 0714  NA 131*  K 3.7  CL 94*  CO2 24  GLUCOSE 158*  BUN 11  CREATININE 0.77  CALCIUM 8.8*   GFR: Estimated Creatinine  Clearance: 110.9 mL/min (by C-G formula based on SCr of 0.77 mg/dL). Liver Function Tests: Recent Labs  Lab 11/15/19 0714  AST 25  ALT 18  ALKPHOS 77  BILITOT 1.0  PROT 7.4  ALBUMIN 3.8   No results for input(s): LIPASE, AMYLASE in the last 168 hours. No results for input(s): AMMONIA in the last 168 hours. Coagulation Profile: Recent Labs  Lab 11/15/19 0714  INR 1.1   Cardiac Enzymes: No results for input(s): CKTOTAL, CKMB, CKMBINDEX, TROPONINI in the last 168 hours. BNP (last 3 results) No results for input(s): PROBNP in the last 8760 hours. HbA1C: No results for input(s): HGBA1C in the last 72 hours. CBG: No results for input(s): GLUCAP in the last 168 hours. Lipid Profile: No results for input(s): CHOL, HDL, LDLCALC, TRIG, CHOLHDL, LDLDIRECT in the last 72 hours. Thyroid Function Tests: No results for input(s): TSH, T4TOTAL, FREET4, T3FREE, THYROIDAB in the last 72 hours. Anemia Panel: No results for input(s): VITAMINB12, FOLATE, FERRITIN, TIBC, IRON, RETICCTPCT in the last 72 hours. Urine analysis:    Component Value Date/Time   COLORURINE YELLOW 11/15/2019 0735   APPEARANCEUR HAZY (A) 11/15/2019 0735   LABSPEC 1.014 11/15/2019 0735   PHURINE 7.0 11/15/2019 0735   GLUCOSEU NEGATIVE 11/15/2019 0735   HGBUR SMALL (A) 11/15/2019 0735   BILIRUBINUR NEGATIVE 11/15/2019 0735   KETONESUR NEGATIVE 11/15/2019 0735   PROTEINUR 30 (A) 11/15/2019 0735   UROBILINOGEN 0.2 04/18/2015 2030   NITRITE POSITIVE (A) 11/15/2019 0735   LEUKOCYTESUR LARGE (A) 11/15/2019 0735    Radiological Exams on Admission: CT ABDOMEN PELVIS WO CONTRAST  Result Date: 11/15/2019 CLINICAL DATA:  Fever and flank pain starting this morning EXAM: CT ABDOMEN AND PELVIS WITHOUT CONTRAST TECHNIQUE: Multidetector CT imaging of the abdomen and pelvis was performed following the standard protocol without IV contrast. COMPARISON:  September 19, 2017 FINDINGS: Lower chest: No acute abnormality. Hepatobiliary:  Diffuse low density of liver is identified without focal liver lesion. Patient status post prior cholecystectomy. The biliary tree is normal. Pancreas: Unremarkable. No pancreatic ductal dilatation or surrounding inflammatory changes. Spleen: Normal in size without focal abnormality. Adrenals/Urinary Tract: The bilateral adrenal glands are normal. There is a 2 mm nonobstructing stone in a  left upper pole calyx. There is mild left hydronephrosis but no focal discrete obstructing stone is identified in the left collecting system. The right kidney is normal. Bladder is normal. Stomach/Bowel: Stomach is within normal limits. Appendix appears normal. No evidence of bowel wall thickening, distention, or inflammatory changes. Vascular/Lymphatic: Aortic atherosclerosis. No enlarged abdominal or pelvic lymph nodes. Reproductive: Uterus and bilateral adnexa are unremarkable. Other: None. Musculoskeletal: No acute abnormality. IMPRESSION: 1. Mild left hydronephrosis but no focal discrete obstructing stone is identified in the left collecting system. 2. Nonobstructing stone in the left upper pole calyx. 3. Fatty infiltration of liver. Electronically Signed   By: Sherian Rein M.D.   On: 11/15/2019 10:36    Assessment/Plan Active Problems:   Sepsis (HCC)    Sepsis secondary to recurrent UTI -Urine cultures ordered and pending -Continue empirically on Merrem with multiple drug allergies listed -Continue monitoring repeat labs and lactic acid -Continue on IV fluid -Monitor on telemetry  Mild left sided hydronephrosis with nephrolithiasis -History of recurrent nephrolithiasis as well as prior stenting -No AKI or creatinine elevation currently noted, and therefore, can be managed conservatively -Continue to monitor repeat renal panel -Continues to have chronic flank pain that is not acute -Mild nausea and will start clear liquid diet for now  Dyslipidemia -Continue home statin and  fenofibrate  Hypertension -Continue valsartan and hold Lasix  GERD -PPI  Obesity/PCOS -Lifestyle changes outpatient -Hold home Metformin for now  Tobacco abuse -Currently smokes 2 pack/day -Counseled on cessation and will start nicotine patch while hospitalized  Chronic pain -Continue gabapentin, muscle relaxers  DVT prophylaxis: Lovenox Code Status: Full Family Communication: None at bedside Disposition Plan:Admit for treatment of sepsis/uti Consults called:None Admission status: Inpatient, tele   Meredyth Hornung Hoover Brunette DO [facility] Pager [phone]  If 7PM-7AM, please contact night-coverage [email or web address] Password [facility]  11/15/2019, 12:09 PM

## 2019-11-15 NOTE — ED Notes (Signed)
Date and time results received: 11/15/19  (use smartphrase ".now" to insert current time)  Test: lactic Critical Value:  Name of Provider Notified: Dr Sabra Heck  Orders Received? Or Actions Taken?: n/a

## 2019-11-16 ENCOUNTER — Encounter (HOSPITAL_COMMUNITY): Payer: Self-pay | Admitting: Emergency Medicine

## 2019-11-16 ENCOUNTER — Telehealth (HOSPITAL_BASED_OUTPATIENT_CLINIC_OR_DEPARTMENT_OTHER): Payer: Self-pay | Admitting: Emergency Medicine

## 2019-11-16 ENCOUNTER — Telehealth (HOSPITAL_BASED_OUTPATIENT_CLINIC_OR_DEPARTMENT_OTHER): Payer: Self-pay | Admitting: *Deleted

## 2019-11-16 ENCOUNTER — Inpatient Hospital Stay (HOSPITAL_COMMUNITY)
Admission: EM | Admit: 2019-11-16 | Discharge: 2019-11-19 | DRG: 872 | Disposition: A | Discharge status: Home Health | Payer: Medicaid Other | Attending: Family Medicine | Admitting: Family Medicine

## 2019-11-16 ENCOUNTER — Other Ambulatory Visit: Payer: Self-pay

## 2019-11-16 DIAGNOSIS — E785 Hyperlipidemia, unspecified: Secondary | ICD-10-CM | POA: Diagnosis present

## 2019-11-16 DIAGNOSIS — N136 Pyonephrosis: Secondary | ICD-10-CM | POA: Diagnosis present

## 2019-11-16 DIAGNOSIS — Z1623 Resistance to quinolones and fluoroquinolones: Secondary | ICD-10-CM | POA: Diagnosis present

## 2019-11-16 DIAGNOSIS — N10 Acute tubulo-interstitial nephritis: Secondary | ICD-10-CM | POA: Diagnosis present

## 2019-11-16 DIAGNOSIS — J42 Unspecified chronic bronchitis: Secondary | ICD-10-CM | POA: Diagnosis present

## 2019-11-16 DIAGNOSIS — E876 Hypokalemia: Secondary | ICD-10-CM | POA: Diagnosis present

## 2019-11-16 DIAGNOSIS — Z807 Family history of other malignant neoplasms of lymphoid, hematopoietic and related tissues: Secondary | ICD-10-CM

## 2019-11-16 DIAGNOSIS — R7881 Bacteremia: Secondary | ICD-10-CM | POA: Diagnosis not present

## 2019-11-16 DIAGNOSIS — Z79899 Other long term (current) drug therapy: Secondary | ICD-10-CM

## 2019-11-16 DIAGNOSIS — J449 Chronic obstructive pulmonary disease, unspecified: Secondary | ICD-10-CM | POA: Diagnosis present

## 2019-11-16 DIAGNOSIS — A4151 Sepsis due to Escherichia coli [E. coli]: Principal | ICD-10-CM | POA: Diagnosis present

## 2019-11-16 DIAGNOSIS — E871 Hypo-osmolality and hyponatremia: Secondary | ICD-10-CM | POA: Diagnosis present

## 2019-11-16 DIAGNOSIS — J45909 Unspecified asthma, uncomplicated: Secondary | ICD-10-CM | POA: Diagnosis present

## 2019-11-16 DIAGNOSIS — N12 Tubulo-interstitial nephritis, not specified as acute or chronic: Secondary | ICD-10-CM | POA: Diagnosis present

## 2019-11-16 DIAGNOSIS — K219 Gastro-esophageal reflux disease without esophagitis: Secondary | ICD-10-CM | POA: Diagnosis present

## 2019-11-16 DIAGNOSIS — E782 Mixed hyperlipidemia: Secondary | ICD-10-CM

## 2019-11-16 DIAGNOSIS — G2581 Restless legs syndrome: Secondary | ICD-10-CM | POA: Diagnosis present

## 2019-11-16 DIAGNOSIS — A419 Sepsis, unspecified organism: Secondary | ICD-10-CM

## 2019-11-16 DIAGNOSIS — I10 Essential (primary) hypertension: Secondary | ICD-10-CM | POA: Diagnosis present

## 2019-11-16 DIAGNOSIS — Z20828 Contact with and (suspected) exposure to other viral communicable diseases: Secondary | ICD-10-CM | POA: Diagnosis present

## 2019-11-16 DIAGNOSIS — Z7984 Long term (current) use of oral hypoglycemic drugs: Secondary | ICD-10-CM

## 2019-11-16 DIAGNOSIS — F17210 Nicotine dependence, cigarettes, uncomplicated: Secondary | ICD-10-CM | POA: Diagnosis present

## 2019-11-16 DIAGNOSIS — Z1629 Resistance to other single specified antibiotic: Secondary | ICD-10-CM | POA: Diagnosis present

## 2019-11-16 DIAGNOSIS — Z880 Allergy status to penicillin: Secondary | ICD-10-CM

## 2019-11-16 DIAGNOSIS — Z833 Family history of diabetes mellitus: Secondary | ICD-10-CM

## 2019-11-16 DIAGNOSIS — Z1611 Resistance to penicillins: Secondary | ICD-10-CM | POA: Diagnosis present

## 2019-11-16 DIAGNOSIS — E860 Dehydration: Secondary | ICD-10-CM | POA: Diagnosis present

## 2019-11-16 DIAGNOSIS — E1140 Type 2 diabetes mellitus with diabetic neuropathy, unspecified: Secondary | ICD-10-CM | POA: Diagnosis present

## 2019-11-16 DIAGNOSIS — B9620 Unspecified Escherichia coli [E. coli] as the cause of diseases classified elsewhere: Secondary | ICD-10-CM

## 2019-11-16 DIAGNOSIS — N3010 Interstitial cystitis (chronic) without hematuria: Secondary | ICD-10-CM | POA: Diagnosis present

## 2019-11-16 DIAGNOSIS — Z8249 Family history of ischemic heart disease and other diseases of the circulatory system: Secondary | ICD-10-CM

## 2019-11-16 DIAGNOSIS — Z886 Allergy status to analgesic agent status: Secondary | ICD-10-CM

## 2019-11-16 DIAGNOSIS — Z882 Allergy status to sulfonamides status: Secondary | ICD-10-CM

## 2019-11-16 DIAGNOSIS — Z885 Allergy status to narcotic agent status: Secondary | ICD-10-CM

## 2019-11-16 DIAGNOSIS — M797 Fibromyalgia: Secondary | ICD-10-CM | POA: Diagnosis present

## 2019-11-16 DIAGNOSIS — Z87442 Personal history of urinary calculi: Secondary | ICD-10-CM

## 2019-11-16 DIAGNOSIS — E781 Pure hyperglyceridemia: Secondary | ICD-10-CM | POA: Diagnosis present

## 2019-11-16 DIAGNOSIS — N390 Urinary tract infection, site not specified: Secondary | ICD-10-CM | POA: Diagnosis present

## 2019-11-16 DIAGNOSIS — F411 Generalized anxiety disorder: Secondary | ICD-10-CM | POA: Diagnosis present

## 2019-11-16 LAB — BLOOD CULTURE ID PANEL (REFLEXED)

## 2019-11-16 LAB — COMPREHENSIVE METABOLIC PANEL
ALT: 36 U/L (ref 0–44)
AST: 60 U/L — ABNORMAL HIGH (ref 15–41)
Albumin: 3.2 g/dL — ABNORMAL LOW (ref 3.5–5.0)
Alkaline Phosphatase: 90 U/L (ref 38–126)
Anion gap: 10 (ref 5–15)
BUN: 9 mg/dL (ref 6–20)
CO2: 25 mmol/L (ref 22–32)
Calcium: 8.5 mg/dL — ABNORMAL LOW (ref 8.9–10.3)
Chloride: 101 mmol/L (ref 98–111)
Creatinine, Ser: 0.72 mg/dL (ref 0.44–1.00)
GFR calc Af Amer: >60 mL/min (ref >60)
GFR calc non Af Amer: >60 mL/min (ref >60)
Glucose, Bld: 135 mg/dL — ABNORMAL HIGH (ref 70–99)
Potassium: 3.2 mmol/L — ABNORMAL LOW (ref 3.5–5.1)
Sodium: 136 mmol/L (ref 135–145)
Total Bilirubin: 0.7 mg/dL (ref 0.3–1.2)
Total Protein: 6.6 g/dL (ref 6.5–8.1)

## 2019-11-16 LAB — CBC WITH DIFFERENTIAL/PLATELET
Abs Immature Granulocytes: 0.04 10*3/uL (ref 0.00–0.07)
Basophils Absolute: 0 10*3/uL (ref 0.0–0.1)
Basophils Relative: 0 %
Eosinophils Absolute: 0.1 10*3/uL (ref 0.0–0.5)
Eosinophils Relative: 1 %
HCT: 42.1 % (ref 36.0–46.0)
Hemoglobin: 13.1 g/dL (ref 12.0–15.0)
Immature Granulocytes: 1 %
Lymphocytes Relative: 6 %
Lymphs Abs: 0.5 10*3/uL — ABNORMAL LOW (ref 0.7–4.0)
MCH: 27.3 pg (ref 26.0–34.0)
MCHC: 31.1 g/dL (ref 30.0–36.0)
MCV: 87.9 fL (ref 80.0–100.0)
Monocytes Absolute: 0.3 10*3/uL (ref 0.1–1.0)
Monocytes Relative: 4 %
Neutro Abs: 6.6 10*3/uL (ref 1.7–7.7)
Neutrophils Relative %: 88 %
Platelets: 200 10*3/uL (ref 150–400)
RBC: 4.79 MIL/uL (ref 3.87–5.11)
RDW: 14.6 % (ref 11.5–15.5)
WBC: 7.5 10*3/uL (ref 4.0–10.5)
nRBC: 0 % (ref 0.0–0.2)

## 2019-11-16 LAB — GLUCOSE, CAPILLARY: Glucose-Capillary: 191 mg/dL — ABNORMAL HIGH (ref 70–99)

## 2019-11-16 LAB — LACTIC ACID, PLASMA
Lactic Acid, Venous: 1.7 mmol/L (ref 0.5–1.9)
Lactic Acid, Venous: 1.4 mmol/L (ref 0.5–1.9)

## 2019-11-16 MED ORDER — HYDROCODONE-ACETAMINOPHEN 5-325 MG PO TABS
1.0000 | ORAL_TABLET | Freq: Four times a day (QID) | ORAL | Status: DC | PRN
  Administered 2019-11-16 (×2): 1 via ORAL
  Administered 2019-11-16 – 2019-11-19 (×9): 2 via ORAL
  Filled 2019-11-16 (×3): qty 2
  Filled 2019-11-16: qty 1
  Filled 2019-11-16: qty 2
  Filled 2019-11-16: qty 1
  Filled 2019-11-16 (×6): qty 2

## 2019-11-16 MED ORDER — LACTATED RINGERS IV SOLN: INTRAVENOUS | Status: DC

## 2019-11-16 MED ORDER — LACTATED RINGERS IV BOLUS (SEPSIS)
1000.0000 mL | Freq: Once | INTRAVENOUS | Status: AC
  Administered 2019-11-16: 1000 mL via INTRAVENOUS

## 2019-11-16 MED ORDER — KETOROLAC TROMETHAMINE 30 MG/ML IJ SOLN
30.0000 mg | Freq: Once | INTRAMUSCULAR | Status: AC
  Administered 2019-11-16: 30 mg via INTRAVENOUS
  Filled 2019-11-16: qty 1

## 2019-11-16 MED ORDER — SODIUM CHLORIDE 0.9 % IV SOLN
1.0000 g | Freq: Three times a day (TID) | INTRAVENOUS | Status: DC
  Administered 2019-11-16 – 2019-11-19 (×8): 1 g via INTRAVENOUS
  Filled 2019-11-16 (×8): qty 1

## 2019-11-16 MED ORDER — POTASSIUM CHLORIDE CRYS ER 20 MEQ PO TBCR
40.0000 meq | EXTENDED_RELEASE_TABLET | Freq: Once | ORAL | Status: AC
  Administered 2019-11-16: 40 meq via ORAL
  Filled 2019-11-16: qty 2

## 2019-11-16 MED ORDER — SODIUM CHLORIDE 0.9 % IV SOLN
1.0000 g | Freq: Once | INTRAVENOUS | Status: AC
  Administered 2019-11-16: 1 g via INTRAVENOUS
  Filled 2019-11-16: qty 1

## 2019-11-16 MED ORDER — INSULIN ASPART 100 UNIT/ML ~~LOC~~ SOLN: 0.0000 [IU] | Freq: Three times a day (TID) | SUBCUTANEOUS | Status: DC

## 2019-11-16 MED ORDER — ACETAMINOPHEN 325 MG PO TABS
650.0000 mg | ORAL_TABLET | Freq: Four times a day (QID) | ORAL | Status: DC | PRN
  Filled 2019-11-16: qty 2

## 2019-11-16 MED ORDER — ONDANSETRON HCL 4 MG PO TABS: 4.0000 mg | ORAL_TABLET | Freq: Four times a day (QID) | ORAL | Status: DC | PRN

## 2019-11-16 MED ORDER — ENOXAPARIN SODIUM 60 MG/0.6ML ~~LOC~~ SOLN
50.0000 mg | SUBCUTANEOUS | Status: DC
  Filled 2019-11-16: qty 0.6

## 2019-11-16 MED ORDER — LACTATED RINGERS IV BOLUS (SEPSIS): 1000.0000 mL | Freq: Once | INTRAVENOUS | Status: DC

## 2019-11-16 MED ORDER — ONDANSETRON HCL 4 MG/2ML IJ SOLN
4.0000 mg | Freq: Four times a day (QID) | INTRAMUSCULAR | Status: DC | PRN
  Administered 2019-11-17: 4 mg via INTRAVENOUS
  Filled 2019-11-16 (×2): qty 2

## 2019-11-16 MED ORDER — ACETAMINOPHEN 325 MG PO TABS
650.0000 mg | ORAL_TABLET | Freq: Four times a day (QID) | ORAL | Status: DC | PRN
  Administered 2019-11-17: 650 mg via ORAL
  Filled 2019-11-16: qty 2

## 2019-11-16 MED ORDER — ACETAMINOPHEN 650 MG RE SUPP: 650.0000 mg | Freq: Four times a day (QID) | RECTAL | Status: DC | PRN

## 2019-11-16 MED ORDER — INSULIN ASPART 100 UNIT/ML ~~LOC~~ SOLN: 0.0000 [IU] | Freq: Every day | SUBCUTANEOUS | Status: DC

## 2019-11-16 NOTE — ED Triage Notes (Addendum)
Patient seen here in ED yesterday and was to be admitted for possible sepsis related to UTI. Patient left AMA. Patient called this morning with confirmation of bacteria in blood cultures. Patient reports temp of 101.8 this morning. Patient took tylenol and advil this morning at 6:50am.

## 2019-11-16 NOTE — H&P (Signed)
History and Physical    Isabella Buchanan DOB: March 12, 1981 DOA: 11/16/2019  PCP: Nicholes Rough, PA-C  Patient coming from: Home  I have personally briefly reviewed patient's old medical records in [place]  Chief Complaint: Fever  HPI: Isabella Buchanan is a 38 y.o. female with medical history significant of hypertension,  hyperlipidemia, who presented to the emergency room with complaints of worsening dysuria, nausea and fevers.  She was evaluated yesterday in the emergency room for the same complaints and found to have sepsis with urinary tract infection.  She received a dose of meropenem, IV fluids.  She initially felt better and decided to leave the hospital [place].  Since returning home, she had continued fevers.  She was called by the hospital today to inform her that her blood cultures had resulted positive and was advised to return to the hospital for further treatments.  She reports continued pain in her left flank, as well as her lower abdomen.  She has dysuria for the past week.  She has been having high fevers since yesterday morning.  Please refer to history and physical done by Dr. Manuella Ghazi on 12/26 for further details.  ED Course: She was noted to be febrile in the emergency room.  She is tachycardic, blood pressure stable.  WBC count has shown improvement since yesterday.  Renal function is stable.  Lactic acid has trended down.  Review of Systems:  General: Positive for fever, malaise, generalized weakness Respiratory: Negative for shortness of breath, cough Cardiac: Negative for chest pain, palpitations GI: Positive for nausea, negative for vomiting and diarrhea All other systems reviewed and found to be negative   Past Medical History:  Diagnosis Date  . Anxiety   . Chronic back pain   . Chronic flank pain   . Chronic pain   . DOE (dyspnea on exertion)   . Fibromyalgia   . GERD (gastroesophageal reflux disease)   . Headache(784.0)    mygrains  . Hiatal hernia   . History of electroencephalogram 11/2013   normal EEG, "psychogenic nonepileptic spell" during EEG  . Hypertension   . IBS (irritable bowel syndrome)   . Interstitial cystitis   . Kidney stones   . Polycystic ovarian syndrome   . PONV (postoperative nausea and vomiting)   . Restless leg   . Seizures ([place])   . Swelling     Past Surgical History:  Procedure Laterality Date  . CHOLECYSTECTOMY    . CYSTOSCOPY W/ URETERAL STENT PLACEMENT Left 08/06/2017   Procedure: CYSTOSCOPY WITH RETROGRADE PYELOGRAM/URETERAL STENT PLACEMENT;  Surgeon: Franchot Gallo, MD;  Location: [facility];  Service: Urology;  Laterality: Left;  . CYSTOSCOPY/URETEROSCOPY/HOLMIUM LASER/STENT PLACEMENT Left 08/21/2017   Procedure: CYSTOSCOPY,LEFT URETERAL STENT REMOVAL, LEFT URETEROSCOPY, STONE BASKET EXTRACTION AND LEFT URETERAL STENT PLACEMENT;  Surgeon: Franchot Gallo, MD;  Location: [facility];  Service: Urology;  Laterality: Left;  . ESOPHAGOGASTRODUODENOSCOPY ENDOSCOPY    . TONSILLECTOMY    . TUBAL LIGATION    . TYMPANOPLASTY      Social History:  reports that she has been smoking cigarettes. She has a 23.00 pack-year smoking history. She has never used smokeless tobacco. She reports current alcohol use. She reports that she does not use drugs.  Allergies  Allergen Reactions  . Omnicef [Cefdinir] Anaphylaxis    Swelling of tongue  . Amoxicillin Nausea And Vomiting and Rash  . Celebrex [Celecoxib] Nausea And Vomiting  . Codeine Nausea Only    Stomach aches  .  Penicillins Nausea And Vomiting and Rash    .Has patient had a PCN reaction causing immediate rash, facial/tongue/throat swelling, SOB or lightheadedness with hypotension: unknown Has patient had a PCN reaction causing severe rash involving mucus membranes or skin necrosis: unknown Has patient had a PCN reaction that required hospitalization: unknown Has patient had a PCN reaction occurring within the last 10 years: no If  all of the above answers are "NO", then may proceed with Cephalosporin use.   . Sulfa Antibiotics Nausea And Vomiting    Family History  Problem Relation Age of Onset  . Hypertension Father   . Diabetes Father   . Seizures Father   . Allergies Father   . Cancer Other   . Diabetes Other   . Lymphoma Maternal Grandmother   . Heart disease Maternal Grandmother   . Leukemia Maternal Grandfather      Prior to Admission medications   Medication Sig Start Date End Date Taking? Authorizing Provider  atorvastatin (LIPITOR) 20 MG tablet Take 20 mg by mouth daily.   Yes [provider]  clonazePAM (KLONOPIN) 0.5 MG tablet Take 0.25-0.5 mg by mouth 2 (two) times daily as needed. 08/08/17  Yes [provider]  esomeprazole (NEXIUM) 40 MG capsule Take 40 mg by mouth daily at 12 noon.   Yes [provider]  famotidine (PEPCID) 20 MG tablet One after supper 05/12/19  Yes Tanda Rockers, MD  fenofibrate 160 MG tablet Take 160 mg by mouth daily.   Yes [provider]  furosemide (LASIX) 20 MG tablet Take 20 mg by mouth as needed.   Yes [provider]  gabapentin (NEURONTIN) 300 MG capsule Take 300 mg by mouth 2 (two) times a day. 05/02/19  Yes [provider]  HYDROcodone-acetaminophen (NORCO/VICODIN) 5-325 MG tablet Take 1 tablet by mouth every 6 (six) hours as needed for moderate pain.   Yes [provider]  metFORMIN (GLUCOPHAGE-XR) 500 MG 24 hr tablet Take 1 tablet by mouth daily. 04/15/19  Yes [provider]  mometasone-formoterol (DULERA) 200-5 MCG/ACT AERO Inhale 2 puffs into the lungs 2 (two) times daily. 07/02/19  Yes Tanda Rockers, MD  montelukast (SINGULAIR) 10 MG tablet Take 10 mg by mouth at bedtime.   Yes [provider]  ondansetron (ZOFRAN) 4 MG tablet Take 1 tablet (4 mg total) by mouth every 6 (six) hours as needed for nausea or vomiting. 10/12/17  Yes Forde Dandy, MD  pramipexole (MIRAPEX) 1 MG tablet  Take 1.5 mg by mouth at bedtime.    Yes [provider]  tiZANidine (ZANAFLEX) 4 MG tablet Take 4 mg by mouth 2 (two) times daily as needed for muscle spasms.   Yes [provider]  valsartan (DIOVAN) 80 MG tablet Take 80 mg by mouth daily.   Yes [provider]    Physical Exam: Vitals:   11/16/19 1745 11/16/19 1845 11/16/19 1915 11/16/19 1930  BP:      Pulse: (!) 104 (!) 111  (!) 107  Resp: 12 (!) 21  (!) 22  Temp:   99.3 F (37.4 C)   TempSrc:   Oral   SpO2: 96% 94%  97%  Weight:      Height:        Constitutional: NAD, calm, comfortable Eyes: PERRL, lids and conjunctivae normal ENMT: Mucous membranes are moist. Posterior pharynx clear of any exudate or lesions.Normal dentition.  Neck: normal, supple, no masses, no thyromegaly Respiratory: clear to auscultation bilaterally, no  wheezing, no crackles. Normal respiratory effort. No accessory muscle use.  Cardiovascular: Regular rate and rhythm, no murmurs / rubs / gallops. No extremity edema. 2+ pedal pulses. No carotid bruits.  Abdomen: no tenderness, no masses palpated. No hepatosplenomegaly. Bowel sounds positive. + Left CVA tenderness Musculoskeletal: no clubbing / cyanosis. No joint deformity upper and lower extremities. Good ROM, no contractures. Normal muscle tone.  Skin: no rashes, lesions, ulcers. No induration Neurologic: CN 2-12 grossly intact. Sensation intact, DTR normal. Strength 5/5 in all 4.  Psychiatric: Normal judgment and insight. Alert and oriented x 3. Normal mood.    Labs on Admission: I have personally reviewed following labs and imaging studies  CBC: Recent Labs  Lab 11/15/19 0714 11/16/19 1029  WBC 16.9* 7.5  NEUTROABS 14.3* 6.6  HGB 13.4 13.1  HCT 42.1 42.1  MCV 87.9 87.9  PLT 265 200   Basic Metabolic Panel: Recent Labs  Lab 11/15/19 0714 11/16/19 1029  NA 131* 136  K 3.7 3.2*  CL 94* 101  CO2 24 25  GLUCOSE 158* 135*  BUN 11 9  CREATININE 0.77 0.72    CALCIUM 8.8* 8.5*   GFR: Estimated Creatinine Clearance: 110.9 mL/min (by C-G formula based on SCr of 0.72 mg/dL). Liver Function Tests: Recent Labs  Lab 11/15/19 0714 11/16/19 1029  AST 25 60*  ALT 18 36  ALKPHOS 77 90  BILITOT 1.0 0.7  PROT 7.4 6.6  ALBUMIN 3.8 3.2*   No results for input(s): LIPASE, AMYLASE in the last 168 hours. No results for input(s): AMMONIA in the last 168 hours. Coagulation Profile: Recent Labs  Lab 11/15/19 0714  INR 1.1   Cardiac Enzymes: No results for input(s): CKTOTAL, CKMB, CKMBINDEX, TROPONINI in the last 168 hours. BNP (last 3 results) No results for input(s): PROBNP in the last 8760 hours. HbA1C: No results for input(s): HGBA1C in the last 72 hours. CBG: No results for input(s): GLUCAP in the last 168 hours. Lipid Profile: No results for input(s): CHOL, HDL, LDLCALC, TRIG, CHOLHDL, LDLDIRECT in the last 72 hours. Thyroid Function Tests: No results for input(s): TSH, T4TOTAL, FREET4, T3FREE, THYROIDAB in the last 72 hours. Anemia Panel: No results for input(s): VITAMINB12, FOLATE, FERRITIN, TIBC, IRON, RETICCTPCT in the last 72 hours. Urine analysis:    Component Value Date/Time   COLORURINE YELLOW 11/15/2019 0735   APPEARANCEUR HAZY (A) 11/15/2019 0735   LABSPEC 1.014 11/15/2019 0735   PHURINE 7.0 11/15/2019 0735   GLUCOSEU NEGATIVE 11/15/2019 0735   HGBUR SMALL (A) 11/15/2019 0735   BILIRUBINUR NEGATIVE 11/15/2019 0735   KETONESUR NEGATIVE 11/15/2019 0735   PROTEINUR 30 (A) 11/15/2019 0735   UROBILINOGEN 0.2 04/18/2015 2030   NITRITE POSITIVE (A) 11/15/2019 0735   LEUKOCYTESUR LARGE (A) 11/15/2019 0735    Radiological Exams on Admission: CT ABDOMEN PELVIS WO CONTRAST  Result Date: 11/15/2019 CLINICAL DATA:  Fever and flank pain starting this morning EXAM: CT ABDOMEN AND PELVIS WITHOUT CONTRAST TECHNIQUE: Multidetector CT imaging of the abdomen and pelvis was performed following the standard protocol without IV contrast.  COMPARISON:  September 19, 2017 FINDINGS: Lower chest: No acute abnormality. Hepatobiliary: Diffuse low density of liver is identified without focal liver lesion. Patient status post prior cholecystectomy. The biliary tree is normal. Pancreas: Unremarkable. No pancreatic ductal dilatation or surrounding inflammatory changes. Spleen: Normal in size without focal abnormality. Adrenals/Urinary Tract: The bilateral adrenal glands are normal. There is a 2 mm nonobstructing stone in a left upper pole calyx. There is mild left hydronephrosis  but no focal discrete obstructing stone is identified in the left collecting system. The right kidney is normal. Bladder is normal. Stomach/Bowel: Stomach is within normal limits. Appendix appears normal. No evidence of bowel wall thickening, distention, or inflammatory changes. Vascular/Lymphatic: Aortic atherosclerosis. No enlarged abdominal or pelvic lymph nodes. Reproductive: Uterus and bilateral adnexa are unremarkable. Other: None. Musculoskeletal: No acute abnormality. IMPRESSION: 1. Mild left hydronephrosis but no focal discrete obstructing stone is identified in the left collecting system. 2. Nonobstructing stone in the left upper pole calyx. 3. Fatty infiltration of liver. Electronically Signed   By: Sherian Rein M.D.   On: 11/15/2019 10:36    EKG: Independently reviewed. Sinus rhythm without acute changes  Assessment/Plan Active Problems:   Sepsis (HCC)   Anxiety, generalized   Essential hypertension   Hypertriglyceridemia without hypercholesterolemia   Asthmatic bronchitis , chronic (HCC)   E coli bacteremia   Acute pyelonephritis     1. E. coli sepsis secondary to pyelonephritis.  Started on meropenem.  Follow-up sensitivities on cultures.  Continue IV fluids.  Lactic acid has trended down.  She received IV fluids in ED per sepsis protocol.  CT abdomen does show mild hydronephrosis, but no obstructing stones.  Renal function is stable. 2. Anxiety.   Continue on Klonopin 3. Hyperlipidemia.  Continue statin 4. Hypertension.  Continue therapy 5. GERD.  Continue PPI  DVT prophylaxis: Lovenox Code Status: Full code Family Communication: Discussed with patient Disposition Plan: Discharge home once improved Consults called:   Admission status: Observation, telemetry  Erick Blinks MD [facility]   If 7PM-7AM, please contact night-coverage [email or web address]   11/16/2019, 7:56 PM

## 2019-11-16 NOTE — ED Provider Notes (Signed)
[facility] EMERGENCY DEPARTMENT Provider Note   CSN: [id number] Arrival date & time: 11/16/19  0944     History Chief Complaint  Patient presents with  . Abnormal Lab    Isabella Buchanan is a 38 y.o. female.  HPI   This patient is a 38 year old female, I saw this patient yesterday and admitted her to the hospital with sepsis from pyelonephritis.  After waiting in the emergency department for several hours undergoing treatment including IV antibiotics she decided to leave AGAINST MEDICAL ADVICE from the hospitalist service.  Please see their respective documentation.  The patient was called at home to inform her that she had abnormal test including bacteria in her bloodstream that was initially detected yesterday.  I have reviewed the medical record and it shows that she has gram-negative rods in her urine and her blood.  This is concerning for sepsis.  The patient states she still feels terrible with nausea, fevers up to 101.8 and back pain.  Symptoms are persistent, gradually worsening, not associated with coughing shortness of breath chest pain.  She does have associated headache and fatigue.  Her symptoms are severe and she presents with recurrent ongoing sepsis needing admission to the hospital.  Past Medical History:  Diagnosis Date  . Anxiety   . Chronic back pain   . Chronic flank pain   . Chronic pain   . DOE (dyspnea on exertion)   . Fibromyalgia   . GERD (gastroesophageal reflux disease)   . Headache(784.0)    mygrains  . Hiatal hernia   . History of electroencephalogram 11/2013   normal EEG, "psychogenic nonepileptic spell" during EEG  . Hypertension   . IBS (irritable bowel syndrome)   . Interstitial cystitis   . Kidney stones   . Polycystic ovarian syndrome   . PONV (postoperative nausea and vomiting)   . Restless leg   . Seizures (HCC)   . Swelling     Patient Active Problem List   Diagnosis Date Noted  . Asthmatic bronchitis , chronic (HCC)  05/12/2019  . Ureteral calculus, left 08/06/2017  . Morbid obesity (HCC) complicated by hyperlipid/hbp 08/02/2016  . Hypertriglyceridemia without hypercholesterolemia 08/02/2016  . Cigarette smoker 08/02/2016  . Essential hypertriglyceridemia 06/30/2015  . Combined fat and carbohydrate induced hyperlipemia 06/30/2015  . Nicotine addiction 06/28/2015  . Cyclic vomiting syndrome 06/28/2015  . Avitaminosis D 06/28/2015  . Essential hypertension 05/04/2015  . Degeneration of intervertebral disc of lumbar region 02/05/2015  . Anxiety, generalized 02/05/2015  . Community acquired pneumonia 07/17/2014  . CAP (community acquired pneumonia) 07/17/2014  . Sepsis (HCC) 07/17/2014  . Encephalopathy 07/17/2014  . Acute respiratory failure (HCC) 07/17/2014  . Seizure (HCC) 12/13/2013  . Seizures (HCC) 12/13/2013  . History of IBS 12/13/2013  . Fibromyalgia   . Chronic back pain   . Interstitial cystitis   . Polycystic ovarian syndrome     Past Surgical History:  Procedure Laterality Date  . CHOLECYSTECTOMY    . CYSTOSCOPY W/ URETERAL STENT PLACEMENT Left 08/06/2017   Procedure: CYSTOSCOPY WITH RETROGRADE PYELOGRAM/URETERAL STENT PLACEMENT;  Surgeon: Marcine Matarahlstedt, Stephen, MD;  Location: [facility];  Service: Urology;  Laterality: Left;  . CYSTOSCOPY/URETEROSCOPY/HOLMIUM LASER/STENT PLACEMENT Left 08/21/2017   Procedure: CYSTOSCOPY,LEFT URETERAL STENT REMOVAL, LEFT URETEROSCOPY, STONE BASKET EXTRACTION AND LEFT URETERAL STENT PLACEMENT;  Surgeon: Marcine Matarahlstedt, Stephen, MD;  Location: [facility];  Service: Urology;  Laterality: Left;  . ESOPHAGOGASTRODUODENOSCOPY ENDOSCOPY    . TONSILLECTOMY    . TUBAL LIGATION    .  TYMPANOPLASTY       OB History    Gravida  2   Para  2   Term      Preterm  2   AB      Living  2     SAB      TAB      Ectopic      Multiple      Live Births              Family History  Problem Relation Age of Onset  . Hypertension Father   . Diabetes Father   .  Seizures Father   . Allergies Father   . Cancer Other   . Diabetes Other   . Lymphoma Maternal Grandmother   . Heart disease Maternal Grandmother   . Leukemia Maternal Grandfather     Social History   Tobacco Use  . Smoking status: Current Every Day Smoker    Packs/day: 1.00    Years: 23.00    Pack years: 23.00    Types: Cigarettes  . Smokeless tobacco: Never Used  Substance Use Topics  . Alcohol use: Yes    Comment: rarely  . Drug use: No    Home Medications Prior to Admission medications   Medication Sig Start Date End Date Taking? Authorizing Provider  atorvastatin (LIPITOR) 20 MG tablet Take 20 mg by mouth daily.    [provider]  clonazePAM (KLONOPIN) 0.5 MG tablet Take 0.25-0.5 mg by mouth 2 (two) times daily as needed. 08/08/17   [provider]  esomeprazole (NEXIUM) 40 MG capsule Take 40 mg by mouth daily at 12 noon.    [provider]  famotidine (PEPCID) 20 MG tablet One after supper 05/12/19   Nyoka Cowden, MD  fenofibrate 160 MG tablet Take 160 mg by mouth daily.    [provider]  furosemide (LASIX) 20 MG tablet Take 20 mg by mouth as needed.    [provider]  gabapentin (NEURONTIN) 300 MG capsule Take 300 mg by mouth 2 (two) times a day. 05/02/19   [provider]  HYDROcodone-acetaminophen (NORCO/VICODIN) 5-325 MG tablet Take 1 tablet by mouth every 6 (six) hours as needed for moderate pain.    [provider]  metFORMIN (GLUCOPHAGE-XR) 500 MG 24 hr tablet Take 1 tablet by mouth daily. 04/15/19   [provider]  mometasone-formoterol (DULERA) 200-5 MCG/ACT AERO Inhale 2 puffs into the lungs 2 (two) times daily. 07/02/19   Nyoka Cowden, MD  montelukast (SINGULAIR) 10 MG tablet Take 10 mg by mouth at bedtime.    [provider]  ondansetron (ZOFRAN) 4 MG tablet Take 1 tablet (4 mg total) by mouth every 6 (six) hours as needed for nausea or vomiting. 10/12/17   Lavera Guise, MD    pramipexole (MIRAPEX) 1 MG tablet Take 1.5 mg by mouth at bedtime.     [provider]  tiZANidine (ZANAFLEX) 4 MG tablet Take 4 mg by mouth 2 (two) times daily as needed for muscle spasms.    [provider]  valsartan (DIOVAN) 80 MG tablet Take 80 mg by mouth daily.    [provider]    Allergies    Omnicef [cefdinir], Amoxicillin, Celebrex [celecoxib], Codeine, Penicillins, and Sulfa antibiotics  Review of Systems   Review of Systems  All other systems reviewed and are negative.   Physical Exam Updated Vital Signs LMP 10/21/2019   Physical Exam Vitals and nursing note reviewed.  Constitutional:      General: She is in acute distress.     Appearance: She is well-developed. She is ill-appearing. She is not diaphoretic.  HENT:     Head: Normocephalic and atraumatic.     Mouth/Throat:     Mouth: Mucous membranes are dry.  Eyes:     General: No scleral icterus.       Right eye: No discharge.        Left eye: No discharge.     Conjunctiva/sclera: Conjunctivae normal.     Pupils: Pupils are equal, round, and reactive to light.  Neck:     Thyroid: No thyromegaly.     Vascular: No JVD.  Cardiovascular:     Rate and Rhythm: Regular rhythm. Tachycardia present.     Heart sounds: Normal heart sounds. No murmur. No friction rub. No gallop.      Comments: Heart rate of 118 Pulmonary:     Effort: Pulmonary effort is normal. No respiratory distress.     Breath sounds: Normal breath sounds. No wheezing or rales.     Comments: Normal lung sounds, no tachypnea, speaks in full sentences Abdominal:     General: Bowel sounds are normal. There is no distension.     Palpations: Abdomen is soft. There is no mass.     Tenderness: There is abdominal tenderness.     Comments: CVA tenderness over the left flank, suprapubic tenderness, no guarding or peritoneal signs  Musculoskeletal:        General: No tenderness. Normal range of motion.     Cervical back: Normal  range of motion and neck supple.  Lymphadenopathy:     Cervical: No cervical adenopathy.  Skin:    General: Skin is warm and dry.     Findings: No erythema or rash.  Neurological:     Mental Status: She is alert.     Coordination: Coordination normal.  Psychiatric:        Behavior: Behavior normal.     ED Results / Procedures / Treatments   Labs (all labs ordered are listed, but only abnormal results are displayed) Labs Reviewed - No data to display  EKG None  Radiology CT ABDOMEN PELVIS WO CONTRAST  Result Date: 11/15/2019 CLINICAL DATA:  Fever and flank pain starting this morning EXAM: CT ABDOMEN AND PELVIS WITHOUT CONTRAST TECHNIQUE: Multidetector CT imaging of the abdomen and pelvis was performed following the standard protocol without IV contrast. COMPARISON:  September 19, 2017 FINDINGS: Lower chest: No acute abnormality. Hepatobiliary: Diffuse low density of liver is identified without focal liver lesion. Patient status post prior cholecystectomy. The biliary tree is normal. Pancreas: Unremarkable. No pancreatic ductal dilatation or surrounding inflammatory changes. Spleen: Normal in size without focal abnormality. Adrenals/Urinary Tract: The bilateral adrenal glands are normal. There is a 2 mm nonobstructing stone in a left upper pole calyx. There is mild left hydronephrosis but no focal discrete obstructing stone is identified in the left collecting system. The right kidney is normal. Bladder is normal. Stomach/Bowel: Stomach is within normal limits. Appendix appears normal. No evidence of bowel wall thickening, distention, or inflammatory changes. Vascular/Lymphatic: Aortic atherosclerosis. No enlarged abdominal or pelvic lymph nodes. Reproductive: Uterus and bilateral adnexa are unremarkable. Other: None. Musculoskeletal: No acute abnormality. IMPRESSION: 1. Mild left hydronephrosis but no focal discrete obstructing stone is identified in the left collecting system. 2.  Nonobstructing stone in the left upper pole calyx. 3. Fatty infiltration of liver. Electronically Signed   By: Sherian Rein  M.D.   On: 11/15/2019 10:36    Procedures .Critical Care Performed by: Noemi Chapel, MD Authorized by: Noemi Chapel, MD   Critical care provider statement:    Critical care time (minutes):  35   Critical care time was exclusive of:  Separately billable procedures and treating other patients and teaching time   Critical care was necessary to treat or prevent imminent or life-threatening deterioration of the following conditions:  Sepsis   Critical care was time spent personally by me on the following activities:  Blood draw for specimens, development of treatment plan with patient or surrogate, discussions with consultants, evaluation of patient's response to treatment, examination of patient, obtaining history from patient or surrogate, ordering and performing treatments and interventions, ordering and review of laboratory studies, ordering and review of radiographic studies, pulse oximetry, re-evaluation of patient's condition and review of old charts   (including critical care time)  Medications Ordered in ED Medications - No data to display  ED Course  I have reviewed the triage vital signs and the nursing notes.  Pertinent labs & imaging results that were available during my care of the patient were reviewed by me and considered in my medical decision making (see chart for details).    MDM Rules/Calculators/A&P                      This patient presents with sepsis, she is known to be ill from her visit yesterday, she comes back now agreeable to stay in the hospital to treat her sepsis.  We know that she has gram-negative rods based on her findings.  She will need to be treated with appropriate antibiotics to cover for this.  She is febrile at home, tachycardic here and will need to be readmitted.  She is septic, she is critically ill, will give 30 cc/kg of IV  fluids presuming that her infection is worsening as well as recurrent IV antibiotics.  She is agreeable to this plan  I discussed the case with Dr. Roderic Palau who will admit the patient to the hospital.  Antibiootics and fluids reinitiated  Raahi KIMORAH RIDOLFI was evaluated in Emergency Department on 11/17/2019 for the symptoms described in the history of present illness. She was evaluated in the context of the global COVID-19 pandemic, which necessitated consideration that the patient might be at risk for infection with the SARS-CoV-2 virus that causes COVID-19. Institutional protocols and algorithms that pertain to the evaluation of patients at risk for COVID-19 are in a state of rapid change based on information released by regulatory bodies including the CDC and federal and state organizations. These policies and algorithms were followed during the patient's care in the ED.   Final Clinical Impression(s) / ED Diagnoses Final diagnoses:  Sepsis, due to unspecified organism, unspecified whether acute organ dysfunction present [facility])  Pyelonephritis  Bacteremia    Rx / DC Orders ED Discharge Orders    None       Noemi Chapel, MD 11/17/19 [phone]

## 2019-11-16 NOTE — Progress Notes (Signed)
Pharmacy Antibiotic Note  Isabella Buchanan is a 38 y.o. female admitted on 11/16/2019 with UTI.  Pharmacy has been consulted for merrem dosing.  Plan: merrem 1gm iv q8h  Height: 5\' 6"  (167.6 cm) Weight: 214 lb (97.1 kg) IBW/kg (Calculated) : 59.3  Temp (24hrs), Avg:98.7 F (37.1 C), Min:97.9 F (36.6 C), Max:99.5 F (37.5 C)  Recent Labs  Lab 11/15/19 0714 11/15/19 0927  WBC 16.9*  --   CREATININE 0.77  --   LATICACIDVEN 3.1* 2.3*    Estimated Creatinine Clearance: 110.9 mL/min (by C-G formula based on SCr of 0.77 mg/dL).    Allergies  Allergen Reactions  . Omnicef [Cefdinir] Anaphylaxis    Swelling of tongue  . Amoxicillin Nausea And Vomiting and Rash  . Celebrex [Celecoxib] Nausea And Vomiting  . Codeine Nausea Only    Stomach aches  . Penicillins Nausea And Vomiting and Rash    .Has patient had a PCN reaction causing immediate rash, facial/tongue/throat swelling, SOB or lightheadedness with hypotension: unknown Has patient had a PCN reaction causing severe rash involving mucus membranes or skin necrosis: unknown Has patient had a PCN reaction that required hospitalization: unknown Has patient had a PCN reaction occurring within the last 10 years: no If all of the above answers are "NO", then may proceed with Cephalosporin use.   . Sulfa Antibiotics Nausea And Vomiting    Antimicrobials this admission: 12/26 merrem >>    Microbiology results: 12/26 BCx:  12/26 UCx:    Thank you for allowing pharmacy to be a part of this patient's care.  Donna Christen Thaer Miyoshi 11/16/2019 10:31 AM

## 2019-11-16 NOTE — Progress Notes (Signed)
Notified bedside nurse of need to draw blood cultures.  

## 2019-11-16 NOTE — Progress Notes (Signed)
Per bedside RN blood cultures drawn last evening and will not be redrawn today at this time

## 2019-11-16 NOTE — Progress Notes (Deleted)
History and Physical    Isabella Buchanan RFF:[id number] DOB: March 12, 1981 DOA: 11/16/2019  PCP: Nicholes Rough, PA-C  Patient coming from: Home  I have personally briefly reviewed patient's old medical records in [place]  Chief Complaint: Fever  HPI: Isabella Buchanan is a 38 y.o. female with medical history significant of hypertension,  hyperlipidemia, who presented to the emergency room with complaints of worsening dysuria, nausea and fevers.  She was evaluated yesterday in the emergency room for the same complaints and found to have sepsis with urinary tract infection.  She received a dose of meropenem, IV fluids.  She initially felt better and decided to leave the hospital [place].  Since returning home, she had continued fevers.  She was called by the hospital today to inform her that her blood cultures had resulted positive and was advised to return to the hospital for further treatments.  She reports continued pain in her left flank, as well as her lower abdomen.  She has dysuria for the past week.  She has been having high fevers since yesterday morning.  Please refer to history and physical done by Dr. Manuella Ghazi on 12/26 for further details.  ED Course: She was noted to be febrile in the emergency room.  She is tachycardic, blood pressure stable.  WBC count has shown improvement since yesterday.  Renal function is stable.  Lactic acid has trended down.  Review of Systems:  General: Positive for fever, malaise, generalized weakness Respiratory: Negative for shortness of breath, cough Cardiac: Negative for chest pain, palpitations GI: Positive for nausea, negative for vomiting and diarrhea All other systems reviewed and found to be negative   Past Medical History:  Diagnosis Date  . Anxiety   . Chronic back pain   . Chronic flank pain   . Chronic pain   . DOE (dyspnea on exertion)   . Fibromyalgia   . GERD (gastroesophageal reflux disease)   . Headache(784.0)    mygrains  . Hiatal hernia   . History of electroencephalogram 11/2013   normal EEG, "psychogenic nonepileptic spell" during EEG  . Hypertension   . IBS (irritable bowel syndrome)   . Interstitial cystitis   . Kidney stones   . Polycystic ovarian syndrome   . PONV (postoperative nausea and vomiting)   . Restless leg   . Seizures ([place])   . Swelling     Past Surgical History:  Procedure Laterality Date  . CHOLECYSTECTOMY    . CYSTOSCOPY W/ URETERAL STENT PLACEMENT Left 08/06/2017   Procedure: CYSTOSCOPY WITH RETROGRADE PYELOGRAM/URETERAL STENT PLACEMENT;  Surgeon: Franchot Gallo, MD;  Location: [facility];  Service: Urology;  Laterality: Left;  . CYSTOSCOPY/URETEROSCOPY/HOLMIUM LASER/STENT PLACEMENT Left 08/21/2017   Procedure: CYSTOSCOPY,LEFT URETERAL STENT REMOVAL, LEFT URETEROSCOPY, STONE BASKET EXTRACTION AND LEFT URETERAL STENT PLACEMENT;  Surgeon: Franchot Gallo, MD;  Location: [facility];  Service: Urology;  Laterality: Left;  . ESOPHAGOGASTRODUODENOSCOPY ENDOSCOPY    . TONSILLECTOMY    . TUBAL LIGATION    . TYMPANOPLASTY      Social History:  reports that she has been smoking cigarettes. She has a 23.00 pack-year smoking history. She has never used smokeless tobacco. She reports current alcohol use. She reports that she does not use drugs.  Allergies  Allergen Reactions  . Omnicef [Cefdinir] Anaphylaxis    Swelling of tongue  . Amoxicillin Nausea And Vomiting and Rash  . Celebrex [Celecoxib] Nausea And Vomiting  . Codeine Nausea Only    Stomach aches  .  Penicillins Nausea And Vomiting and Rash    .Has patient had a PCN reaction causing immediate rash, facial/tongue/throat swelling, SOB or lightheadedness with hypotension: unknown Has patient had a PCN reaction causing severe rash involving mucus membranes or skin necrosis: unknown Has patient had a PCN reaction that required hospitalization: unknown Has patient had a PCN reaction occurring within the last 10 years: no If  all of the above answers are "NO", then may proceed with Cephalosporin use.   . Sulfa Antibiotics Nausea And Vomiting    Family History  Problem Relation Age of Onset  . Hypertension Father   . Diabetes Father   . Seizures Father   . Allergies Father   . Cancer Other   . Diabetes Other   . Lymphoma Maternal Grandmother   . Heart disease Maternal Grandmother   . Leukemia Maternal Grandfather      Prior to Admission medications   Medication Sig Start Date End Date Taking? Authorizing Provider  atorvastatin (LIPITOR) 20 MG tablet Take 20 mg by mouth daily.   Yes [provider]  clonazePAM (KLONOPIN) 0.5 MG tablet Take 0.25-0.5 mg by mouth 2 (two) times daily as needed. 08/08/17  Yes [provider]  esomeprazole (NEXIUM) 40 MG capsule Take 40 mg by mouth daily at 12 noon.   Yes [provider]  famotidine (PEPCID) 20 MG tablet One after supper 05/12/19  Yes Tanda Rockers, MD  fenofibrate 160 MG tablet Take 160 mg by mouth daily.   Yes [provider]  furosemide (LASIX) 20 MG tablet Take 20 mg by mouth as needed.   Yes [provider]  gabapentin (NEURONTIN) 300 MG capsule Take 300 mg by mouth 2 (two) times a day. 05/02/19  Yes [provider]  HYDROcodone-acetaminophen (NORCO/VICODIN) 5-325 MG tablet Take 1 tablet by mouth every 6 (six) hours as needed for moderate pain.   Yes [provider]  metFORMIN (GLUCOPHAGE-XR) 500 MG 24 hr tablet Take 1 tablet by mouth daily. 04/15/19  Yes [provider]  mometasone-formoterol (DULERA) 200-5 MCG/ACT AERO Inhale 2 puffs into the lungs 2 (two) times daily. 07/02/19  Yes Tanda Rockers, MD  montelukast (SINGULAIR) 10 MG tablet Take 10 mg by mouth at bedtime.   Yes [provider]  ondansetron (ZOFRAN) 4 MG tablet Take 1 tablet (4 mg total) by mouth every 6 (six) hours as needed for nausea or vomiting. 10/12/17  Yes Forde Dandy, MD  pramipexole (MIRAPEX) 1 MG tablet  Take 1.5 mg by mouth at bedtime.    Yes [provider]  tiZANidine (ZANAFLEX) 4 MG tablet Take 4 mg by mouth 2 (two) times daily as needed for muscle spasms.   Yes [provider]  valsartan (DIOVAN) 80 MG tablet Take 80 mg by mouth daily.   Yes [provider]    Physical Exam: Vitals:   11/16/19 1745 11/16/19 1845 11/16/19 1915 11/16/19 1930  BP:      Pulse: (!) 104 (!) 111  (!) 107  Resp: 12 (!) 21  (!) 22  Temp:   99.3 F (37.4 C)   TempSrc:   Oral   SpO2: 96% 94%  97%  Weight:      Height:        Constitutional: NAD, calm, comfortable Eyes: PERRL, lids and conjunctivae normal ENMT: Mucous membranes are moist. Posterior pharynx clear of any exudate or lesions.Normal dentition.  Neck: normal, supple, no masses, no thyromegaly Respiratory: clear to auscultation bilaterally, no  wheezing, no crackles. Normal respiratory effort. No accessory muscle use.  Cardiovascular: Regular rate and rhythm, no murmurs / rubs / gallops. No extremity edema. 2+ pedal pulses. No carotid bruits.  Abdomen: no tenderness, no masses palpated. No hepatosplenomegaly. Bowel sounds positive. + Left CVA tenderness Musculoskeletal: no clubbing / cyanosis. No joint deformity upper and lower extremities. Good ROM, no contractures. Normal muscle tone.  Skin: no rashes, lesions, ulcers. No induration Neurologic: CN 2-12 grossly intact. Sensation intact, DTR normal. Strength 5/5 in all 4.  Psychiatric: Normal judgment and insight. Alert and oriented x 3. Normal mood.    Labs on Admission: I have personally reviewed following labs and imaging studies  CBC: Recent Labs  Lab 11/15/19 0714 11/16/19 1029  WBC 16.9* 7.5  NEUTROABS 14.3* 6.6  HGB 13.4 13.1  HCT 42.1 42.1  MCV 87.9 87.9  PLT 265 200   Basic Metabolic Panel: Recent Labs  Lab 11/15/19 0714 11/16/19 1029  NA 131* 136  K 3.7 3.2*  CL 94* 101  CO2 24 25  GLUCOSE 158* 135*  BUN 11 9  CREATININE 0.77 0.72    CALCIUM 8.8* 8.5*   GFR: Estimated Creatinine Clearance: 110.9 mL/min (by C-G formula based on SCr of 0.72 mg/dL). Liver Function Tests: Recent Labs  Lab 11/15/19 0714 11/16/19 1029  AST 25 60*  ALT 18 36  ALKPHOS 77 90  BILITOT 1.0 0.7  PROT 7.4 6.6  ALBUMIN 3.8 3.2*   No results for input(s): LIPASE, AMYLASE in the last 168 hours. No results for input(s): AMMONIA in the last 168 hours. Coagulation Profile: Recent Labs  Lab 11/15/19 0714  INR 1.1   Cardiac Enzymes: No results for input(s): CKTOTAL, CKMB, CKMBINDEX, TROPONINI in the last 168 hours. BNP (last 3 results) No results for input(s): PROBNP in the last 8760 hours. HbA1C: No results for input(s): HGBA1C in the last 72 hours. CBG: No results for input(s): GLUCAP in the last 168 hours. Lipid Profile: No results for input(s): CHOL, HDL, LDLCALC, TRIG, CHOLHDL, LDLDIRECT in the last 72 hours. Thyroid Function Tests: No results for input(s): TSH, T4TOTAL, FREET4, T3FREE, THYROIDAB in the last 72 hours. Anemia Panel: No results for input(s): VITAMINB12, FOLATE, FERRITIN, TIBC, IRON, RETICCTPCT in the last 72 hours. Urine analysis:    Component Value Date/Time   COLORURINE YELLOW 11/15/2019 0735   APPEARANCEUR HAZY (A) 11/15/2019 0735   LABSPEC 1.014 11/15/2019 0735   PHURINE 7.0 11/15/2019 0735   GLUCOSEU NEGATIVE 11/15/2019 0735   HGBUR SMALL (A) 11/15/2019 0735   BILIRUBINUR NEGATIVE 11/15/2019 0735   KETONESUR NEGATIVE 11/15/2019 0735   PROTEINUR 30 (A) 11/15/2019 0735   UROBILINOGEN 0.2 04/18/2015 2030   NITRITE POSITIVE (A) 11/15/2019 0735   LEUKOCYTESUR LARGE (A) 11/15/2019 0735    Radiological Exams on Admission: CT ABDOMEN PELVIS WO CONTRAST  Result Date: 11/15/2019 CLINICAL DATA:  Fever and flank pain starting this morning EXAM: CT ABDOMEN AND PELVIS WITHOUT CONTRAST TECHNIQUE: Multidetector CT imaging of the abdomen and pelvis was performed following the standard protocol without IV contrast.  COMPARISON:  September 19, 2017 FINDINGS: Lower chest: No acute abnormality. Hepatobiliary: Diffuse low density of liver is identified without focal liver lesion. Patient status post prior cholecystectomy. The biliary tree is normal. Pancreas: Unremarkable. No pancreatic ductal dilatation or surrounding inflammatory changes. Spleen: Normal in size without focal abnormality. Adrenals/Urinary Tract: The bilateral adrenal glands are normal. There is a 2 mm nonobstructing stone in a left upper pole calyx. There is mild left hydronephrosis  but no focal discrete obstructing stone is identified in the left collecting system. The right kidney is normal. Bladder is normal. Stomach/Bowel: Stomach is within normal limits. Appendix appears normal. No evidence of bowel wall thickening, distention, or inflammatory changes. Vascular/Lymphatic: Aortic atherosclerosis. No enlarged abdominal or pelvic lymph nodes. Reproductive: Uterus and bilateral adnexa are unremarkable. Other: None. Musculoskeletal: No acute abnormality. IMPRESSION: 1. Mild left hydronephrosis but no focal discrete obstructing stone is identified in the left collecting system. 2. Nonobstructing stone in the left upper pole calyx. 3. Fatty infiltration of liver. Electronically Signed   By: Sherian Rein M.D.   On: 11/15/2019 10:36    EKG: Independently reviewed. Sinus rhythm without acute changes  Assessment/Plan Active Problems:   Sepsis (HCC)   Anxiety, generalized   Essential hypertension   Hypertriglyceridemia without hypercholesterolemia   Asthmatic bronchitis , chronic (HCC)   E coli bacteremia   Acute pyelonephritis     1. E. coli sepsis secondary to pyelonephritis.  Started on meropenem.  Follow-up sensitivities on cultures.  Continue IV fluids.  Lactic acid has trended down.  She received IV fluids in ED per sepsis protocol.  CT abdomen does show mild hydronephrosis, but no obstructing stones.  Renal function is stable. 2. Anxiety.   Continue on Klonopin 3. Hyperlipidemia.  Continue statin 4. Hypertension.  Continue therapy 5. GERD.  Continue PPI  DVT prophylaxis: Lovenox Code Status: Full code Family Communication: Discussed with patient Disposition Plan: Discharge home once improved Consults called:   Admission status: Observation, telemetry  Erick Blinks MD [facility]   If 7PM-7AM, please contact night-coverage [email or web address]   11/16/2019, 7:41 PM

## 2019-11-17 DIAGNOSIS — N3010 Interstitial cystitis (chronic) without hematuria: Secondary | ICD-10-CM | POA: Diagnosis present

## 2019-11-17 DIAGNOSIS — F411 Generalized anxiety disorder: Secondary | ICD-10-CM | POA: Diagnosis present

## 2019-11-17 DIAGNOSIS — K219 Gastro-esophageal reflux disease without esophagitis: Secondary | ICD-10-CM | POA: Diagnosis present

## 2019-11-17 DIAGNOSIS — G2581 Restless legs syndrome: Secondary | ICD-10-CM | POA: Diagnosis present

## 2019-11-17 DIAGNOSIS — E1140 Type 2 diabetes mellitus with diabetic neuropathy, unspecified: Secondary | ICD-10-CM | POA: Diagnosis present

## 2019-11-17 DIAGNOSIS — Z8249 Family history of ischemic heart disease and other diseases of the circulatory system: Secondary | ICD-10-CM | POA: Diagnosis not present

## 2019-11-17 DIAGNOSIS — N136 Pyonephrosis: Secondary | ICD-10-CM | POA: Diagnosis present

## 2019-11-17 DIAGNOSIS — J42 Unspecified chronic bronchitis: Secondary | ICD-10-CM | POA: Diagnosis present

## 2019-11-17 DIAGNOSIS — N12 Tubulo-interstitial nephritis, not specified as acute or chronic: Secondary | ICD-10-CM | POA: Diagnosis not present

## 2019-11-17 DIAGNOSIS — Z833 Family history of diabetes mellitus: Secondary | ICD-10-CM | POA: Diagnosis not present

## 2019-11-17 DIAGNOSIS — R7881 Bacteremia: Secondary | ICD-10-CM | POA: Diagnosis not present

## 2019-11-17 DIAGNOSIS — J449 Chronic obstructive pulmonary disease, unspecified: Secondary | ICD-10-CM | POA: Diagnosis not present

## 2019-11-17 DIAGNOSIS — Z1623 Resistance to quinolones and fluoroquinolones: Secondary | ICD-10-CM | POA: Diagnosis present

## 2019-11-17 DIAGNOSIS — Z807 Family history of other malignant neoplasms of lymphoid, hematopoietic and related tissues: Secondary | ICD-10-CM | POA: Diagnosis not present

## 2019-11-17 DIAGNOSIS — A419 Sepsis, unspecified organism: Secondary | ICD-10-CM | POA: Diagnosis present

## 2019-11-17 DIAGNOSIS — N390 Urinary tract infection, site not specified: Secondary | ICD-10-CM | POA: Diagnosis not present

## 2019-11-17 DIAGNOSIS — E860 Dehydration: Secondary | ICD-10-CM | POA: Diagnosis present

## 2019-11-17 DIAGNOSIS — A4151 Sepsis due to Escherichia coli [E. coli]: Secondary | ICD-10-CM | POA: Diagnosis present

## 2019-11-17 DIAGNOSIS — I10 Essential (primary) hypertension: Secondary | ICD-10-CM | POA: Diagnosis present

## 2019-11-17 DIAGNOSIS — N10 Acute tubulo-interstitial nephritis: Secondary | ICD-10-CM | POA: Diagnosis not present

## 2019-11-17 DIAGNOSIS — E876 Hypokalemia: Secondary | ICD-10-CM | POA: Diagnosis present

## 2019-11-17 DIAGNOSIS — E871 Hypo-osmolality and hyponatremia: Secondary | ICD-10-CM | POA: Diagnosis present

## 2019-11-17 DIAGNOSIS — E785 Hyperlipidemia, unspecified: Secondary | ICD-10-CM | POA: Diagnosis present

## 2019-11-17 DIAGNOSIS — M797 Fibromyalgia: Secondary | ICD-10-CM | POA: Diagnosis present

## 2019-11-17 DIAGNOSIS — Z1629 Resistance to other single specified antibiotic: Secondary | ICD-10-CM | POA: Diagnosis present

## 2019-11-17 DIAGNOSIS — Z1611 Resistance to penicillins: Secondary | ICD-10-CM | POA: Diagnosis present

## 2019-11-17 DIAGNOSIS — F17210 Nicotine dependence, cigarettes, uncomplicated: Secondary | ICD-10-CM | POA: Diagnosis present

## 2019-11-17 DIAGNOSIS — Z20828 Contact with and (suspected) exposure to other viral communicable diseases: Secondary | ICD-10-CM | POA: Diagnosis present

## 2019-11-17 DIAGNOSIS — E781 Pure hyperglyceridemia: Secondary | ICD-10-CM | POA: Diagnosis present

## 2019-11-17 DIAGNOSIS — J45909 Unspecified asthma, uncomplicated: Secondary | ICD-10-CM | POA: Diagnosis present

## 2019-11-17 LAB — URINE CULTURE: Culture: >=100000 — AB

## 2019-11-17 LAB — COMPREHENSIVE METABOLIC PANEL
ALT: 59 U/L — ABNORMAL HIGH (ref 0–44)
AST: 110 U/L — ABNORMAL HIGH (ref 15–41)
Albumin: 2.8 g/dL — ABNORMAL LOW (ref 3.5–5.0)
Alkaline Phosphatase: 115 U/L (ref 38–126)
Anion gap: 10 (ref 5–15)
BUN: 8 mg/dL (ref 6–20)
CO2: 23 mmol/L (ref 22–32)
Calcium: 7.8 mg/dL — ABNORMAL LOW (ref 8.9–10.3)
Chloride: 100 mmol/L (ref 98–111)
Creatinine, Ser: 0.65 mg/dL (ref 0.44–1.00)
GFR calc Af Amer: >60 mL/min (ref >60)
GFR calc non Af Amer: >60 mL/min (ref >60)
Glucose, Bld: 154 mg/dL — ABNORMAL HIGH (ref 70–99)
Potassium: 3.7 mmol/L (ref 3.5–5.1)
Sodium: 133 mmol/L — ABNORMAL LOW (ref 135–145)
Total Bilirubin: 0.5 mg/dL (ref 0.3–1.2)
Total Protein: 5.9 g/dL — ABNORMAL LOW (ref 6.5–8.1)

## 2019-11-17 LAB — CBC
HCT: 38.4 % (ref 36.0–46.0)
Hemoglobin: 11.9 g/dL — ABNORMAL LOW (ref 12.0–15.0)
MCH: 27.7 pg (ref 26.0–34.0)
MCHC: 31 g/dL (ref 30.0–36.0)
MCV: 89.5 fL (ref 80.0–100.0)
Platelets: 150 10*3/uL (ref 150–400)
RBC: 4.29 MIL/uL (ref 3.87–5.11)
RDW: 14.7 % (ref 11.5–15.5)
WBC: 8.8 10*3/uL (ref 4.0–10.5)
nRBC: 0 % (ref 0.0–0.2)

## 2019-11-17 LAB — GLUCOSE, CAPILLARY
Glucose-Capillary: 188 mg/dL — ABNORMAL HIGH (ref 70–99)
Glucose-Capillary: 132 mg/dL — ABNORMAL HIGH (ref 70–99)
Glucose-Capillary: 130 mg/dL — ABNORMAL HIGH (ref 70–99)
Glucose-Capillary: 162 mg/dL — ABNORMAL HIGH (ref 70–99)

## 2019-11-17 LAB — HEMOGLOBIN A1C
Hgb A1c MFr Bld: 7 % — ABNORMAL HIGH (ref 4.8–5.6)
Mean Plasma Glucose: 154.2 mg/dL

## 2019-11-17 LAB — SARS CORONAVIRUS 2 (TAT 6-24 HRS): SARS Coronavirus 2: NEGATIVE

## 2019-11-17 MED ORDER — MONTELUKAST SODIUM 10 MG PO TABS
10.0000 mg | ORAL_TABLET | Freq: Every day | ORAL | Status: DC
  Administered 2019-11-17 – 2019-11-18 (×2): 10 mg via ORAL
  Filled 2019-11-17 (×2): qty 1

## 2019-11-17 MED ORDER — MOMETASONE FURO-FORMOTEROL FUM 200-5 MCG/ACT IN AERO
2.0000 | INHALATION_SPRAY | Freq: Two times a day (BID) | RESPIRATORY_TRACT | Status: DC
  Administered 2019-11-17 – 2019-11-18 (×2): 2 via RESPIRATORY_TRACT
  Filled 2019-11-17 (×2): qty 8.8

## 2019-11-17 MED ORDER — FENOFIBRATE 160 MG PO TABS
160.0000 mg | ORAL_TABLET | Freq: Every day | ORAL | Status: DC
  Administered 2019-11-17 – 2019-11-19 (×3): 160 mg via ORAL
  Filled 2019-11-17 (×3): qty 1

## 2019-11-17 MED ORDER — ATORVASTATIN CALCIUM 20 MG PO TABS
20.0000 mg | ORAL_TABLET | Freq: Every day | ORAL | Status: DC
  Administered 2019-11-17 – 2019-11-19 (×3): 20 mg via ORAL
  Filled 2019-11-17 (×3): qty 1

## 2019-11-17 MED ORDER — GABAPENTIN 300 MG PO CAPS
300.0000 mg | ORAL_CAPSULE | Freq: Two times a day (BID) | ORAL | Status: DC
  Administered 2019-11-17 – 2019-11-19 (×5): 300 mg via ORAL
  Filled 2019-11-17 (×5): qty 1

## 2019-11-17 MED ORDER — PANTOPRAZOLE SODIUM 40 MG PO TBEC
40.0000 mg | DELAYED_RELEASE_TABLET | Freq: Every day | ORAL | Status: DC
  Administered 2019-11-17 – 2019-11-19 (×3): 40 mg via ORAL
  Filled 2019-11-17 (×3): qty 1

## 2019-11-17 NOTE — Progress Notes (Signed)
PROGRESS NOTE    Isabella Buchanan  ZOX:[id number] DOB: 1981/06/27 DOA: 11/16/2019 PCP: Ladora DanielBeal, Sheri, PA-C     Brief Narrative:  38 y.o. female with medical history significant of hypertension,  hyperlipidemia, who presented to the emergency room with complaints of worsening dysuria, nausea and fevers.  She was evaluated yesterday in the emergency room for the same complaints and found to have sepsis with urinary tract infection.  She received a dose of meropenem, IV fluids.  She initially felt better and decided to leave the hospital AGAINST MEDICAL ADVICE.  Since returning home, she had continued fevers.  She was called by the hospital today to inform her that her blood cultures had resulted positive and was advised to return to the hospital for further treatments.  She reports continued pain in her left flank, as well as her lower abdomen.  She has dysuria for the past week.  She has been having high fevers since yesterday morning.    Assessment & Plan: 1-sepsis secondary to E. coli bacteremia/UTI -Based on sensitivity microorganism is resistant to ampicillin, ciprofloxacin and Bactrim. -Patient with anaphylactic reaction to penicillin -For now continue IV meropenem -Will discuss with pharmacy to find out she has been able to tolerate cephalosporins in the past. -Continue IV fluids and supportive care -Continue as needed antipyretics -Patient still spiking fever.  2-dehydration, hyponatremia and hypokalemia -Improve after fluid resuscitation electrolyte repletion -Sodium is still borderline low -Patient eating and drinking better. -Potassium within normal limits currently. -Continue to follow electrolytes trend.  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 -Continue Neurontin  4-essential hypertension -Blood pressure stable -Holding Diovan and Lasix currently in the setting  of sepsis. -Follow vital signs and resume antihypertensive agents when safe.  5-gastroesophageal reflux disease -Continue PPI.  6-hyperlipidemia/hypertriglyceridemia -Will resume the use of Lipitor and fenofibrate  7-history of asthma/chronic bronchitis -Continue singular and related -No shortness of breath or wheezing currently.  DVT prophylaxis: Lovenox Code Status: Full code Family Communication: No family at bedside. Disposition Plan: Remains inpatient, continue IV fluids and IV antibiotics; patient still spiking fever.  Consultants:   None  Procedures:   See below for x-ray reports.  Antimicrobials:  Anti-infectives (From admission, onward)   Start     Dose/Rate Route Frequency Ordered Stop   11/16/19 1800  meropenem (MERREM) 1 g in sodium chloride 0.9 % 100 mL IVPB     1 g 200 mL/hr over 30 Minutes Intravenous Every 8 hours 11/16/19 1028     11/16/19 1015  meropenem (MERREM) 1 g in sodium chloride 0.9 % 100 mL IVPB     1 g 200 mL/hr over 30 Minutes Intravenous  Once 11/16/19 1012 11/16/19 1130       Subjective: No nausea, no vomiting, no chest pain, no shortness of breath.  Feeling better today.  Still spiking fever.  Reports some intermittent dysuria.  Objective: Vitals:   11/16/19 2130 11/16/19 2227 11/17/19 0055 11/17/19 0513  BP:  103/68 120/87 (!) 133/95  Pulse: (!) 107 (!) 103 92 96  Resp: (!) 22 20 20 20   Temp:  (!) 100.8 F (38.2 C) 98.8 F (37.1 C) (!) 100.4 F (38 C)  TempSrc:  Oral Oral Oral  SpO2: 95% 96% 96% 96%  Weight:  96.2 kg    Height:  5\' 6"  (1.676 m)      Intake/Output Summary (Last 24 hours) at 11/17/2019 1205 Last  data filed at 11/17/2019 0900 Gross per 24 hour  Intake 4050 ml  Output 500 ml  Net 3550 ml   Filed Weights   11/16/19 1009 11/16/19 2227  Weight: 97.1 kg 96.2 kg    Examination: General exam: Alert, awake, oriented x 3; still spiking fever, reports no nausea, no vomiting.  No chest pain or shortness of  breath. Respiratory system: Clear to auscultation. Respiratory effort normal. Cardiovascular system: Slightly tachycardic; No murmurs, rubs, gallops. Gastrointestinal system: Abdomen is nondistended, soft and nontender. No organomegaly or masses felt. Normal bowel sounds heard. Central nervous system: Alert and oriented. No focal neurological deficits. Extremities: No C/C/E, +pedal pulses Skin: No rashes, lesions or ulcers Psychiatry: Judgement and insight appear normal. Mood & affect appropriate.     Data Reviewed: I have personally reviewed following labs and imaging studies  CBC: Recent Labs  Lab 11/15/19 0714 11/16/19 1029 11/17/19 0523  WBC 16.9* 7.5 8.8  NEUTROABS 14.3* 6.6  --   HGB 13.4 13.1 11.9*  HCT 42.1 42.1 38.4  MCV 87.9 87.9 89.5  PLT 265 200 150   Basic Metabolic Panel: Recent Labs  Lab 11/15/19 0714 11/16/19 1029 11/17/19 0523  NA 131* 136 133*  K 3.7 3.2* 3.7  CL 94* 101 100  CO2 24 25 23   GLUCOSE 158* 135* 154*  BUN 11 9 8   CREATININE 0.77 0.72 0.65  CALCIUM 8.8* 8.5* 7.8*   GFR: Estimated Creatinine Clearance: 110.4 mL/min (by C-G formula based on SCr of 0.65 mg/dL).   Liver Function Tests: Recent Labs  Lab 11/15/19 0714 11/16/19 1029 11/17/19 0523  AST 25 60* 110*  ALT 18 36 59*  ALKPHOS 77 90 115  BILITOT 1.0 0.7 0.5  PROT 7.4 6.6 5.9*  ALBUMIN 3.8 3.2* 2.8*   Coagulation Profile: Recent Labs  Lab 11/15/19 0714  INR 1.1   HbA1C: Recent Labs    11/16/19 1029  HGBA1C 7.0*   CBG: Recent Labs  Lab 11/16/19 2225 11/17/19 0728 11/17/19 1130  GLUCAP 191* 188* 132*   Urine analysis:    Component Value Date/Time   COLORURINE YELLOW 11/15/2019 0735   APPEARANCEUR HAZY (A) 11/15/2019 0735   LABSPEC 1.014 11/15/2019 0735   PHURINE 7.0 11/15/2019 0735   GLUCOSEU NEGATIVE 11/15/2019 0735   HGBUR SMALL (A) 11/15/2019 0735   BILIRUBINUR NEGATIVE 11/15/2019 0735   KETONESUR NEGATIVE 11/15/2019 0735   PROTEINUR 30 (A)  11/15/2019 0735   UROBILINOGEN 0.2 04/18/2015 2030   NITRITE POSITIVE (A) 11/15/2019 0735   LEUKOCYTESUR LARGE (A) 11/15/2019 0735    Recent Results (from the past 240 hour(s))  Blood Culture (routine x 2)     Status: None (Preliminary result)   Collection Time: 11/15/19  7:14 AM   Specimen: Right Antecubital; Blood  Result Value Ref Range Status   Specimen Description   Final    RIGHT ANTECUBITAL BOTTLES DRAWN AEROBIC AND ANAEROBIC   Special Requests Blood Culture adequate volume  Final   Culture   Final    NO GROWTH 2 DAYS Performed at [facility], [address], [place], [address] [place]    Report Status PENDING  Incomplete  Blood Culture (routine x 2)     Status: Abnormal (Preliminary result)   Collection Time: 11/15/19  7:19 AM   Specimen: Left Antecubital; Blood  Result Value Ref Range Status   Specimen Description   Final    LEFT ANTECUBITAL BOTTLES DRAWN AEROBIC AND ANAEROBIC Performed at [facility], [address], [place],  [place] [address]    Special Requests   Final    Blood Culture adequate volume Performed at [facility], [address] [place], [place] [address]    Culture  Setup Time   Final    GRAM NEGATIVE RODS Gram Stain Report Called to,Read Back By and Verified With: NICHOLS,K. AT 2312 ON 11/15/2019 BY EVA AEROBIC BOTTLE ONLY Performed at [facility]    Culture (A)  Final    ESCHERICHIA COLI SUSCEPTIBILITIES TO FOLLOW Performed at [facility], [address] [place], [place] [address]    Report Status PENDING  Incomplete  Blood Culture ID Panel (Reflexed)     Status: Abnormal   Collection Time: 11/15/19  7:19 AM  Result Value Ref Range Status   Enterococcus species NOT DETECTED NOT DETECTED Final   Listeria monocytogenes NOT DETECTED NOT DETECTED Final   Staphylococcus species NOT DETECTED NOT DETECTED Final   Staphylococcus aureus (BCID) NOT DETECTED NOT DETECTED Final   Streptococcus species NOT DETECTED NOT DETECTED Final    Streptococcus agalactiae NOT DETECTED NOT DETECTED Final   Streptococcus pneumoniae NOT DETECTED NOT DETECTED Final   Streptococcus pyogenes NOT DETECTED NOT DETECTED Final   Acinetobacter baumannii NOT DETECTED NOT DETECTED Final   Enterobacteriaceae species DETECTED (A) NOT DETECTED Final    Comment: Enterobacteriaceae represent a large family of gram-negative bacteria, not a single organism. CRITICAL RESULT CALLED TO, READ BACK BY AND VERIFIED WITH: Dory Peru RN @ 0500 ON 11/16/19 BY ROBINSON Z.    Enterobacter cloacae complex NOT DETECTED NOT DETECTED Final   Escherichia coli DETECTED (A) NOT DETECTED Final    Comment: CRITICAL RESULT CALLED TO, READ BACK BY AND VERIFIED WITH: Dory Peru RN @ 0500 ON 11/16/19 BY ROBINSON Z.    Klebsiella oxytoca NOT DETECTED NOT DETECTED Final   Klebsiella pneumoniae NOT DETECTED NOT DETECTED Final   Proteus species NOT DETECTED NOT DETECTED Final   Serratia marcescens NOT DETECTED NOT DETECTED Final   Carbapenem resistance NOT DETECTED NOT DETECTED Final   Haemophilus influenzae NOT DETECTED NOT DETECTED Final   Neisseria meningitidis NOT DETECTED NOT DETECTED Final   Pseudomonas aeruginosa NOT DETECTED NOT DETECTED Final   Candida albicans NOT DETECTED NOT DETECTED Final   Candida glabrata NOT DETECTED NOT DETECTED Final   Candida krusei NOT DETECTED NOT DETECTED Final   Candida parapsilosis NOT DETECTED NOT DETECTED Final   Candida tropicalis NOT DETECTED NOT DETECTED Final    Comment: Performed at [facility], [address], [place], [place] [address]  Urine culture     Status: Abnormal   Collection Time: 11/15/19  7:35 AM   Specimen: Urine, Clean Catch  Result Value Ref Range Status   Specimen Description   Final    URINE, CLEAN CATCH Performed at [facility], [address], [place], [place] [address]    Special Requests   Final    NONE Performed at [facility], [address], [place], [place] [address]    Culture  >=100,000 COLONIES/mL ESCHERICHIA COLI (A)  Final   Report Status 11/17/2019 FINAL  Final   Organism ID, Bacteria ESCHERICHIA COLI (A)  Final      Susceptibility   Escherichia coli - MIC*    AMPICILLIN >=32 RESISTANT Resistant     CEFAZOLIN <=4 SENSITIVE Sensitive     CEFTRIAXONE <=1 SENSITIVE Sensitive     CIPROFLOXACIN >=4 RESISTANT Resistant     GENTAMICIN <=1 SENSITIVE Sensitive     IMIPENEM <=  0.25 SENSITIVE Sensitive     NITROFURANTOIN <=16 SENSITIVE Sensitive     TRIMETH/SULFA >=320 RESISTANT Resistant     AMPICILLIN/SULBACTAM 16 INTERMEDIATE Intermediate     PIP/TAZO <=4 SENSITIVE Sensitive     * >=100,000 COLONIES/mL ESCHERICHIA COLI  SARS CORONAVIRUS 2 (TAT 6-24 HRS) Nasopharyngeal Nasopharyngeal Swab     Status: None   Collection Time: 11/16/19 12:31 PM   Specimen: Nasopharyngeal Swab  Result Value Ref Range Status   SARS Coronavirus 2 NEGATIVE NEGATIVE Final    Comment: (NOTE) SARS-CoV-2 target nucleic acids are NOT DETECTED. The SARS-CoV-2 RNA is generally detectable in upper and lower respiratory specimens during the acute phase of infection. Negative results do not preclude SARS-CoV-2 infection, do not rule out co-infections with other pathogens, and should not be used as the sole basis for treatment or other patient management decisions. Negative results must be combined with clinical observations, patient history, and epidemiological information. The expected result is Negative. Fact Sheet for Patients: SugarRoll.be Fact Sheet for Healthcare Providers: https://www.woods-mathews.com/ This test is not yet approved or cleared by the [place] FDA and  has been authorized for detection and/or diagnosis of SARS-CoV-2 by FDA under an Emergency Use Authorization (EUA). This EUA will remain  in effect (meaning this test can be used) for the duration of the COVID-19 declaration under Section 56 4(b)(1) of the Act, 21  U.S.C. section 360bbb-3(b)(1), unless the authorization is terminated or revoked sooner. Performed at [facility], [place] [address] [place], [place] [address]      Radiology Studies: No results found.   Scheduled Meds: . enoxaparin (LOVENOX) injection  50 mg Subcutaneous Q24H  . insulin aspart  0-15 Units Subcutaneous TID WC  . insulin aspart  0-5 Units Subcutaneous QHS   Continuous Infusions: . lactated ringers Stopped (11/16/19 1510)  . lactated ringers 100 mL/hr at 11/17/19 0534  . meropenem (MERREM) IV Stopped (11/17/19 0603)     LOS: 0 days    Time spent: 35 minutes. Greater than 50% of this time was spent in direct contact with the patient, coordinating care and discussing relevant ongoing clinical issues, including bacteremia and sepsis in the setting of E. coli pyelonephritis.  Patient is still spiking fever, even overall feeling better.  We will continue IV antibiotics and follow sensitivity.     Barton Dubois, MD [facility] Pager [phone]   11/17/2019, 12:05 PM

## 2019-11-18 ENCOUNTER — Telehealth: Payer: Self-pay

## 2019-11-18 ENCOUNTER — Inpatient Hospital Stay: Payer: Self-pay

## 2019-11-18 LAB — BASIC METABOLIC PANEL
Anion gap: 11 (ref 5–15)
BUN: 9 mg/dL (ref 6–20)
CO2: 21 mmol/L — ABNORMAL LOW (ref 22–32)
Calcium: 8.1 mg/dL — ABNORMAL LOW (ref 8.9–10.3)
Chloride: 105 mmol/L (ref 98–111)
Creatinine, Ser: 0.58 mg/dL (ref 0.44–1.00)
GFR calc Af Amer: >60 mL/min (ref >60)
GFR calc non Af Amer: >60 mL/min (ref >60)
Glucose, Bld: 154 mg/dL — ABNORMAL HIGH (ref 70–99)
Potassium: 3.8 mmol/L (ref 3.5–5.1)
Sodium: 137 mmol/L (ref 135–145)

## 2019-11-18 LAB — CULTURE, BLOOD (ROUTINE X 2): Special Requests: ADEQUATE

## 2019-11-18 LAB — GLUCOSE, CAPILLARY: Glucose-Capillary: 165 mg/dL — ABNORMAL HIGH (ref 70–99)

## 2019-11-18 MED ORDER — SODIUM CHLORIDE 0.9% FLUSH
10.0000 mL | Freq: Two times a day (BID) | INTRAVENOUS | Status: DC
  Administered 2019-11-18: 10 mL

## 2019-11-18 MED ORDER — SODIUM CHLORIDE 0.9% FLUSH
10.0000 mL | INTRAVENOUS | Status: DC | PRN
  Administered 2019-11-18: 10 mL

## 2019-11-18 MED ORDER — ACYCLOVIR 5 % EX CREA
TOPICAL_CREAM | Freq: Four times a day (QID) | CUTANEOUS | Status: DC
  Filled 2019-11-18: qty 5

## 2019-11-18 MED ORDER — MOMETASONE FURO-FORMOTEROL FUM 200-5 MCG/ACT IN AERO
2.0000 | INHALATION_SPRAY | Freq: Two times a day (BID) | RESPIRATORY_TRACT | Status: DC
  Filled 2019-11-18: qty 8.8

## 2019-11-18 MED ORDER — CHLORHEXIDINE GLUCONATE CLOTH 2 % EX PADS
6.0000 | MEDICATED_PAD | Freq: Every day | CUTANEOUS | Status: DC
  Administered 2019-11-19: 6 via TOPICAL

## 2019-11-18 NOTE — Progress Notes (Addendum)
PHARMACY CONSULT NOTE FOR:  OUTPATIENT  PARENTERAL ANTIBIOTIC THERAPY (OPAT)  Indication: E. Coli bacteremia/UTI with resistance/allergies Regimen: Ertapenem 1000 mg IV every 24 hours. End date: Last day of therapy 11/22/19  IV antibiotic discharge orders are pended. To discharging provider:  please sign these orders via discharge navigator,  Select New Orders & click on the button choice - Manage This Unsigned Work.     Thank you for allowing pharmacy to be a part of this patient's care.  Ramond Craver 11/18/2019, 2:39 PM

## 2019-11-18 NOTE — Progress Notes (Signed)
Peripherally Inserted Central Catheter/Midline Placement  The IV Nurse has discussed with the patient and/or persons authorized to consent for the patient, the purpose of this procedure and the potential benefits and risks involved with this procedure.  The benefits include less needle sticks, lab draws from the catheter, and the patient may be discharged home with the catheter. Risks include, but not limited to, infection, bleeding, blood clot (thrombus formation), and puncture of an artery; nerve damage and irregular heartbeat and possibility to perform a PICC exchange if needed/ordered by physician.  Alternatives to this procedure were also discussed.  Bard Power PICC patient education guide, fact sheet on infection prevention and patient information card has been provided to patient /or left at bedside.    PICC/Midline Placement Documentation  PICC Single Lumen 35/00/93 PICC Right Basilic 41 cm 0 cm (Active)  Indication for Insertion or Continuance of Line Home intravenous therapies (PICC only) 11/18/19 1800  Exposed Catheter (cm) 0 cm 11/18/19 1800  Site Assessment Clean;Dry;Intact 11/18/19 1800  Line Status Flushed;Saline locked;Blood return noted 11/18/19 1800  Dressing Type Transparent;Securing device 11/18/19 1800  Dressing Status Clean;Dry;Intact;Antimicrobial disc in place 11/18/19 1800  [place] checked and tightened 11/18/19 1800  Dressing Change Due 11/25/19 11/18/19 1800       Darlyn Read 11/18/2019, 6:56 PM

## 2019-11-18 NOTE — Progress Notes (Signed)
Pharmacy Antibiotic Note  Isabella Buchanan is a 38 y.o. female admitted on 11/16/2019 with UTI/pyelonephritis. Pharmacy has been consulted for merrem dosing. E. Coli bacteremia/UTI resistant to cipro and bactrim.  Patient has severe allergy to amoxicillin and cefdinir.  Plan: merrem 1gm iv q8h  Monitor labs, c/s, and patient improvement.  Height: 5\' 6"  (167.6 cm) Weight: 212 lb 1.3 oz (96.2 kg) IBW/kg (Calculated) : 59.3  Temp (24hrs), Avg:99.1 F (37.3 C), Min:98.4 F (36.9 C), Max:100 F (37.8 C)  Recent Labs  Lab 11/15/19 0714 11/15/19 0927 11/16/19 1029 11/16/19 1213 11/17/19 0523 11/18/19 0652  WBC 16.9*  --  7.5  --  8.8  --   CREATININE 0.77  --  0.72  --  0.65 0.58  LATICACIDVEN 3.1* 2.3* 1.7 1.4  --   --     Estimated Creatinine Clearance: 110.4 mL/min (by C-G formula based on SCr of 0.58 mg/dL).    Allergies  Allergen Reactions  . Omnicef [Cefdinir] Anaphylaxis    Swelling of tongue  . Amoxicillin Nausea And Vomiting and Rash  . Celebrex [Celecoxib] Nausea And Vomiting  . Codeine Nausea Only    Stomach aches  . Penicillins Nausea And Vomiting and Rash    .Has patient had a PCN reaction causing immediate rash, facial/tongue/throat swelling, SOB or lightheadedness with hypotension: unknown Has patient had a PCN reaction causing severe rash involving mucus membranes or skin necrosis: unknown Has patient had a PCN reaction that required hospitalization: unknown Has patient had a PCN reaction occurring within the last 10 years: no If all of the above answers are "NO", then may proceed with Cephalosporin use.   . Sulfa Antibiotics Nausea And Vomiting    Antimicrobials this admission: Merrem 12/26 >>    Microbiology results: 12/26 BCx: e.coli 12/26 UCx: e. coli   Thank you for allowing pharmacy to be a part of this patient's care.  Ramond Craver 11/18/2019 9:17 AM

## 2019-11-18 NOTE — Clinical Social Work Note (Signed)
Per attending, patient will need IV abx at home. Message left for Carolynn Sayers with [facility] regarding potential to accept patient.     Kielan Dreisbach, Clydene Pugh, LCSW

## 2019-11-18 NOTE — Telephone Encounter (Signed)
Positive UC AP Ed 11/15/2019  currenlty adm and appro. Treatment per Elicia Lamp Pharm D

## 2019-11-18 NOTE — Progress Notes (Signed)
PROGRESS NOTE    Isabella Buchanan  KZS:[id number] DOB: 1981-02-04 DOA: 11/16/2019 PCP: Nicholes Rough, PA-C     Brief Narrative:  38 y.o. female with medical history significant of hypertension,  hyperlipidemia, who presented to the emergency room with complaints of worsening dysuria, nausea and fevers.  She was evaluated yesterday in the emergency room for the same complaints and found to have sepsis with urinary tract infection.  She received a dose of meropenem, IV fluids.  She initially felt better and decided to leave the hospital [place].  Since returning home, she had continued fevers.  She was called by the hospital today to inform her that her blood cultures had resulted positive and was advised to return to the hospital for further treatments.  She reports continued pain in her left flank, as well as her lower abdomen.  She has dysuria for the past week.  She has been having high fevers since yesterday morning.    Assessment & Plan: 1-sepsis secondary to E. coli bacteremia/UTI -Based on sensitivity microorganism is resistant to ampicillin, ciprofloxacin and Bactrim. -Patient with anaphylactic reaction to penicillin -After discussing with infectious disease provider recommendations given for IV meropenem as antibiotic of choice and looking to treat for a total of 7 days. -PICC line placement has been requested. -Continue IV fluids (R adjusted rate) and supportive care -Continue as needed antipyretics -Patient with low-grade temperature overnight; so far afebrile and feeling otherwise significantly improved.  2-dehydration, hyponatremia and hypokalemia -Improve after fluid resuscitation electrolyte repletion -Sodium is still borderline low -Patient eating and drinking better. -Potassium within normal limits currently. -Continue to follow electrolytes trend.  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 -Continue Neurontin  4-essential hypertension -Blood pressure stable -Holding Diovan and Lasix currently in the setting of sepsis. -Follow vital signs and resume antihypertensive agents when safe.  5-gastroesophageal reflux disease -Continue PPI.  6-hyperlipidemia/hypertriglyceridemia -Will resume the use of Lipitor and fenofibrate  7-history of asthma/chronic bronchitis -Continue singular and related -No shortness of breath or wheezing currently.  DVT prophylaxis: Lovenox Code Status: Full code Family Communication: No family at bedside. Disposition Plan: Remains inpatient, continue IV fluids and IV antibiotics; temperature overnight.  Order for PICC line placement and home health services provided.  Hopefully discharge home in the next 24 hours if she remains afebrile..  Consultants:   Infectious disease curbside (Dr. Tommy Medal); recommendations given for 7 days of IV meropenem and.  Procedures:   See below for x-ray reports.  Antimicrobials:  Anti-infectives (From admission, onward)   Start     Dose/Rate Route Frequency Ordered Stop   11/16/19 1800  meropenem (MERREM) 1 g in sodium chloride 0.9 % 100 mL IVPB     1 g 200 mL/hr over 30 Minutes Intravenous Every 8 hours 11/16/19 1028     11/16/19 1015  meropenem (MERREM) 1 g in sodium chloride 0.9 % 100 mL IVPB     1 g 200 mL/hr over 30 Minutes Intravenous  Once 11/16/19 1012 11/16/19 1130       Subjective: Low-grade temperature overnight; denies chest pain, no nausea, no vomiting, no abdominal pain.  Mild intermittent dysuria but significantly improved.  Objective: Vitals:   11/17/19 2037 11/17/19 2236 11/18/19 0459 11/18/19 0758  BP: (!) 135/94  106/60   Pulse: 99 97 92   Resp: 20  18   Temp: 100 F (37.8 C) 98.8  F (37.1 C) 98.4 F (36.9 C)   TempSrc: Oral Oral Oral   SpO2: 98% 99% 99% 98%  Weight:      Height:        Intake/Output Summary (Last 24 hours) at  11/18/2019 1435 Last data filed at 11/18/2019 0700 Gross per 24 hour  Intake 1245 ml  Output 4200 ml  Net -2955 ml   Filed Weights   11/16/19 1009 11/16/19 2227  Weight: 97.1 kg 96.2 kg    Examination: General exam: overall feeling better.  Patient reports no nausea, no vomiting, no abdominal pain.  Low-grade temperature appreciated overnight.   Respiratory system: Clear to auscultation. Respiratory effort normal. Cardiovascular system:RRR. No murmurs, rubs, gallops. Gastrointestinal system: Abdomen is nondistended, soft and nontender. No organomegaly or masses felt. Normal bowel sounds heard. Central nervous system: Alert and oriented. No focal neurological deficits. Extremities: No C/C/E, +pedal pulses Skin: No rashes, lesions or ulcers Psychiatry: Judgement and insight appear normal. Mood & affect appropriate.    Data Reviewed: I have personally reviewed following labs and imaging studies  CBC: Recent Labs  Lab 11/15/19 0714 11/16/19 1029 11/17/19 0523  WBC 16.9* 7.5 8.8  NEUTROABS 14.3* 6.6  --   HGB 13.4 13.1 11.9*  HCT 42.1 42.1 38.4  MCV 87.9 87.9 89.5  PLT 265 200 150   Basic Metabolic Panel: Recent Labs  Lab 11/15/19 0714 11/16/19 1029 11/17/19 0523 11/18/19 0652  NA 131* 136 133* 137  K 3.7 3.2* 3.7 3.8  CL 94* 101 100 105  CO2 24 25 23  21*  GLUCOSE 158* 135* 154* 154*  BUN 11 9 8 9   CREATININE 0.77 0.72 0.65 0.58  CALCIUM 8.8* 8.5* 7.8* 8.1*   GFR: Estimated Creatinine Clearance: 110.4 mL/min (by C-G formula based on SCr of 0.58 mg/dL).   Liver Function Tests: Recent Labs  Lab 11/15/19 0714 11/16/19 1029 11/17/19 0523  AST 25 60* 110*  ALT 18 36 59*  ALKPHOS 77 90 115  BILITOT 1.0 0.7 0.5  PROT 7.4 6.6 5.9*  ALBUMIN 3.8 3.2* 2.8*   Coagulation Profile: Recent Labs  Lab 11/15/19 0714  INR 1.1   HbA1C: Recent Labs    11/16/19 1029  HGBA1C 7.0*   CBG: Recent Labs  Lab 11/17/19 0728 11/17/19 1130 11/17/19 1635  11/17/19 2039 11/18/19 0742  GLUCAP 188* 132* 130* 162* 165*   Urine analysis:    Component Value Date/Time   COLORURINE YELLOW 11/15/2019 0735   APPEARANCEUR HAZY (A) 11/15/2019 0735   LABSPEC 1.014 11/15/2019 0735   PHURINE 7.0 11/15/2019 0735   GLUCOSEU NEGATIVE 11/15/2019 0735   HGBUR SMALL (A) 11/15/2019 0735   BILIRUBINUR NEGATIVE 11/15/2019 0735   KETONESUR NEGATIVE 11/15/2019 0735   PROTEINUR 30 (A) 11/15/2019 0735   UROBILINOGEN 0.2 04/18/2015 2030   NITRITE POSITIVE (A) 11/15/2019 0735   LEUKOCYTESUR LARGE (A) 11/15/2019 0735    Recent Results (from the past 240 hour(s))  Blood Culture (routine x 2)     Status: None (Preliminary result)   Collection Time: 11/15/19  7:14 AM   Specimen: Right Antecubital; Blood  Result Value Ref Range Status   Specimen Description   Final    RIGHT ANTECUBITAL BOTTLES DRAWN AEROBIC AND ANAEROBIC   Special Requests Blood Culture adequate volume  Final   Culture   Final    NO GROWTH 3 DAYS Performed at [facility], [address], [place], [address] [place]    Report Status PENDING  Incomplete  Blood Culture (routine  x 2)     Status: Abnormal   Collection Time: 11/15/19  7:19 AM   Specimen: Left Antecubital; Blood  Result Value Ref Range Status   Specimen Description   Final    LEFT ANTECUBITAL BOTTLES DRAWN AEROBIC AND ANAEROBIC Performed at [facility], [address] [place], [place] [address]    Special Requests   Final    Blood Culture adequate volume Performed at [facility], [address], [place], [place] [address]    Culture  Setup Time   Final    GRAM NEGATIVE RODS Gram Stain Report Called to,Read Back By and Verified With: NICHOLS,K. AT 2312 ON 11/15/2019 BY EVA AEROBIC BOTTLE ONLY Performed at [facility]    Culture ESCHERICHIA COLI (A)  Final   Report Status 11/18/2019 FINAL  Final   Organism ID, Bacteria ESCHERICHIA COLI  Final      Susceptibility   Escherichia coli - MIC*    AMPICILLIN >=32  RESISTANT Resistant     CEFAZOLIN <=4 SENSITIVE Sensitive     CEFEPIME <=1 SENSITIVE Sensitive     CEFTAZIDIME <=1 SENSITIVE Sensitive     CEFTRIAXONE <=1 SENSITIVE Sensitive     CIPROFLOXACIN >=4 RESISTANT Resistant     GENTAMICIN <=1 SENSITIVE Sensitive     IMIPENEM <=0.25 SENSITIVE Sensitive     TRIMETH/SULFA >=320 RESISTANT Resistant     AMPICILLIN/SULBACTAM 16 INTERMEDIATE Intermediate     PIP/TAZO <=4 SENSITIVE Sensitive     * ESCHERICHIA COLI  Blood Culture ID Panel (Reflexed)     Status: Abnormal   Collection Time: 11/15/19  7:19 AM  Result Value Ref Range Status   Enterococcus species NOT DETECTED NOT DETECTED Final   Listeria monocytogenes NOT DETECTED NOT DETECTED Final   Staphylococcus species NOT DETECTED NOT DETECTED Final   Staphylococcus aureus (BCID) NOT DETECTED NOT DETECTED Final   Streptococcus species NOT DETECTED NOT DETECTED Final   Streptococcus agalactiae NOT DETECTED NOT DETECTED Final   Streptococcus pneumoniae NOT DETECTED NOT DETECTED Final   Streptococcus pyogenes NOT DETECTED NOT DETECTED Final   Acinetobacter baumannii NOT DETECTED NOT DETECTED Final   Enterobacteriaceae species DETECTED (A) NOT DETECTED Final    Comment: Enterobacteriaceae represent a large family of gram-negative bacteria, not a single organism. CRITICAL RESULT CALLED TO, READ BACK BY AND VERIFIED WITH: Dory Peru RN @ 0500 ON 11/16/19 BY ROBINSON Z.    Enterobacter cloacae complex NOT DETECTED NOT DETECTED Final   Escherichia coli DETECTED (A) NOT DETECTED Final    Comment: CRITICAL RESULT CALLED TO, READ BACK BY AND VERIFIED WITH: Dory Peru RN @ 0500 ON 11/16/19 BY ROBINSON Z.    Klebsiella oxytoca NOT DETECTED NOT DETECTED Final   Klebsiella pneumoniae NOT DETECTED NOT DETECTED Final   Proteus species NOT DETECTED NOT DETECTED Final   Serratia marcescens NOT DETECTED NOT DETECTED Final   Carbapenem resistance NOT DETECTED NOT DETECTED Final   Haemophilus  influenzae NOT DETECTED NOT DETECTED Final   Neisseria meningitidis NOT DETECTED NOT DETECTED Final   Pseudomonas aeruginosa NOT DETECTED NOT DETECTED Final   Candida albicans NOT DETECTED NOT DETECTED Final   Candida glabrata NOT DETECTED NOT DETECTED Final   Candida krusei NOT DETECTED NOT DETECTED Final   Candida parapsilosis NOT DETECTED NOT DETECTED Final   Candida tropicalis NOT DETECTED NOT DETECTED Final    Comment: Performed at [facility], [address] [place], [place] [address]  Urine culture     Status: Abnormal  Collection Time: 11/15/19  7:35 AM   Specimen: Urine, Clean Catch  Result Value Ref Range Status   Specimen Description   Final    URINE, CLEAN CATCH Performed at [facility], [address] [place], [place] [address]    Special Requests   Final    NONE Performed at [facility], [address] [place], [place] [address]    Culture >=100,000 COLONIES/mL ESCHERICHIA COLI (A)  Final   Report Status 11/17/2019 FINAL  Final   Organism ID, Bacteria ESCHERICHIA COLI (A)  Final      Susceptibility   Escherichia coli - MIC*    AMPICILLIN >=32 RESISTANT Resistant     CEFAZOLIN <=4 SENSITIVE Sensitive     CEFTRIAXONE <=1 SENSITIVE Sensitive     CIPROFLOXACIN >=4 RESISTANT Resistant     GENTAMICIN <=1 SENSITIVE Sensitive     IMIPENEM <=0.25 SENSITIVE Sensitive     NITROFURANTOIN <=16 SENSITIVE Sensitive     TRIMETH/SULFA >=320 RESISTANT Resistant     AMPICILLIN/SULBACTAM 16 INTERMEDIATE Intermediate     PIP/TAZO <=4 SENSITIVE Sensitive     * >=100,000 COLONIES/mL ESCHERICHIA COLI  SARS CORONAVIRUS 2 (TAT 6-24 HRS) Nasopharyngeal Nasopharyngeal Swab     Status: None   Collection Time: 11/16/19 12:31 PM   Specimen: Nasopharyngeal Swab  Result Value Ref Range Status   SARS Coronavirus 2 NEGATIVE NEGATIVE Final    Comment: (NOTE) SARS-CoV-2 target nucleic acids are NOT DETECTED. The SARS-CoV-2 RNA is generally detectable in upper and lower respiratory  specimens during the acute phase of infection. Negative results do not preclude SARS-CoV-2 infection, do not rule out co-infections with other pathogens, and should not be used as the sole basis for treatment or other patient management decisions. Negative results must be combined with clinical observations, patient history, and epidemiological information. The expected result is Negative. Fact Sheet for Patients: HairSlick.nohttps://www.fda.gov/media/138098/download Fact Sheet for Healthcare Providers: quierodirigir.comhttps://www.fda.gov/media/138095/download This test is not yet approved or cleared by the [place] FDA and  has been authorized for detection and/or diagnosis of SARS-CoV-2 by FDA under an Emergency Use Authorization (EUA). This EUA will remain  in effect (meaning this test can be used) for the duration of the COVID-19 declaration under Section 56 4(b)(1) of the Act, 21 U.S.C. section 360bbb-3(b)(1), unless the authorization is terminated or revoked sooner. Performed at [facility], [address] [place], [place] [address]      Radiology Studies: US EKG SITE RITE  Result Date: 11/18/2019 If Site Rite image not attached, placement could not be confirmed due to current cardiac rhythm.   Scheduled Meds: . atorvastatin  20 mg Oral Daily  . enoxaparin (LOVENOX) injection  50 mg Subcutaneous Q24H  . fenofibrate  160 mg Oral Daily  . gabapentin  300 mg Oral BID  . insulin aspart  0-15 Units Subcutaneous TID WC  . insulin aspart  0-5 Units Subcutaneous QHS  . mometasone-formoterol  2 puff Inhalation BID  . montelukast  10 mg Oral QHS  . pantoprazole  40 mg Oral Daily   Continuous Infusions: . lactated ringers Stopped (11/16/19 1510)  . lactated ringers 100 mL/hr at 11/18/19 1354  . meropenem (MERREM) IV 1 g (11/18/19 1351)     LOS: 1 day    Time spent: 35 minutes.    Vassie Lollarlos Jonmarc Bodkin, MD [facility] Pager [phone]   11/18/2019, 2:35 PM

## 2019-11-19 ENCOUNTER — Telehealth: Payer: Self-pay

## 2019-11-19 DIAGNOSIS — N390 Urinary tract infection, site not specified: Secondary | ICD-10-CM

## 2019-11-19 DIAGNOSIS — N12 Tubulo-interstitial nephritis, not specified as acute or chronic: Secondary | ICD-10-CM

## 2019-11-19 MED ORDER — SODIUM CHLORIDE 0.9 % IV SOLN
1.0000 g | Freq: Once | INTRAVENOUS | Status: AC
  Administered 2019-11-19: 1000 mg via INTRAVENOUS
  Filled 2019-11-19: qty 1

## 2019-11-19 MED ORDER — ACETAMINOPHEN 325 MG PO TABS: 650.0000 mg | ORAL_TABLET | Freq: Four times a day (QID) | ORAL | 0 refills | Status: AC | PRN

## 2019-11-19 MED ORDER — ESOMEPRAZOLE MAGNESIUM 40 MG PO CPDR: 40.0000 mg | DELAYED_RELEASE_CAPSULE | Freq: Every day | ORAL | 2 refills | Status: AC

## 2019-11-19 MED ORDER — METFORMIN HCL ER 500 MG PO TB24: 500.0000 mg | ORAL_TABLET | Freq: Every day | ORAL | 5 refills | Status: AC

## 2019-11-19 MED ORDER — ERTAPENEM IV (FOR PTA / DISCHARGE USE ONLY): 1.0000 g | INTRAVENOUS | 0 refills | Status: AC

## 2019-11-19 NOTE — TOC Transition Note (Signed)
Transition of Care [facility]) - CM/SW Discharge Note   Patient Details  Name: Isabella Buchanan MRN: [id number] Date of Birth: September 15, 1981  [facility]) CM/SW Contact:  Salvadore Valvano Dimitri Ped, LCSW Phone Number: 11/19/2019, 4:15 PM   Clinical Narrative:   Patient discharging home with HH/RN on IV antibiotics. Pt and her fiance received education around administering IV antibiotics from Carolynn Sayers on today.   [place] Transitions of Care  Clinical Social Worker  Ph: [phone]    Final next level of care: Home w Hospice Care Barriers to Discharge: No Barriers Identified   Patient Goals and CMS Choice Patient states their goals for this hospitalization and ongoing recovery are:: to discharge home whie being followed by home health care      Discharge Placement                  Name of family member notified: Mickie Hillier PH:[phone] Patient and family notified of of transfer: 11/19/19  Discharge Plan and Services                DME Arranged: IV pump/equipment         HH Arranged: RN, IV Antibiotics HH Agency: [facility] Date HH Agency Contacted: 11/18/19   Representative spoke with at [place]: [place] (SDOH) Interventions     Readmission Risk Interventions No flowsheet data found.

## 2019-11-19 NOTE — Telephone Encounter (Signed)
+   BC from ED 11/15/2019  Currently adm and being treated per Young Berry D

## 2019-11-19 NOTE — Discharge Summary (Signed)
Isabella Buchanan, is a 38 y.o. female  DOB [birth year]  MRN [id number].  Admission date:  11/16/2019  Admitting Physician  Barton Dubois, MD  Discharge Date:  11/19/2019   Primary MD  Nicholes Rough, PA-C  Recommendations for primary care physician for things to follow:   1) IV antibiotic/Invanz/Ertapenem as prescribed through November 21, 2018 2) smoking cessation strongly advised--- may use over-the-counter nicotine patch to help you quit smoking 3) drink plenty of fluids--- avoid dehydration 4) follow-up with the primary care physician on Monday November 24, 2019 for repeat CBC and BMP test 5) Home health registered nurse to pull (discontinue) right arm PICC line per protocol on 11/21/2018 after completing Invanz infusion  Admission Diagnosis  Bacteremia [R78.81] Pyelonephritis [N12] Sepsis ([place]) [A41.9] Sepsis, due to unspecified organism, unspecified whether acute organ dysfunction present ([place]) [A41.9] Sepsis secondary to UTI ([place]) [A41.9, N39.0]   Discharge Diagnosis  Bacteremia [R78.81] Pyelonephritis [N12] Sepsis ([place]) [A41.9] Sepsis, due to unspecified organism, unspecified whether acute organ dysfunction present ([place]) [A41.9] Sepsis secondary to UTI ([place]) [A41.9, N39.0]    Active Problems:   Sepsis ([place])   Anxiety, generalized   Essential hypertension   Hypertriglyceridemia without hypercholesterolemia   Asthmatic bronchitis , chronic ([place])   E coli bacteremia   Acute pyelonephritis   Sepsis secondary to UTI [facility])      Past Medical History:  Diagnosis Date  . Anxiety   . Chronic back pain   . Chronic flank pain   . Chronic pain   . DOE (dyspnea on exertion)   . Fibromyalgia   . GERD (gastroesophageal reflux disease)   . Headache(784.0)    mygrains  . Hiatal hernia   . History of electroencephalogram 11/2013   normal EEG, "psychogenic nonepileptic spell" during EEG  . Hypertension    . IBS (irritable bowel syndrome)   . Interstitial cystitis   . Kidney stones   . Polycystic ovarian syndrome   . PONV (postoperative nausea and vomiting)   . Restless leg   . Seizures ([place])   . Swelling     Past Surgical History:  Procedure Laterality Date  . CHOLECYSTECTOMY    . CYSTOSCOPY W/ URETERAL STENT PLACEMENT Left 08/06/2017   Procedure: CYSTOSCOPY WITH RETROGRADE PYELOGRAM/URETERAL STENT PLACEMENT;  Surgeon: Franchot Gallo, MD;  Location: [facility];  Service: Urology;  Laterality: Left;  . CYSTOSCOPY/URETEROSCOPY/HOLMIUM LASER/STENT PLACEMENT Left 08/21/2017   Procedure: CYSTOSCOPY,LEFT URETERAL STENT REMOVAL, LEFT URETEROSCOPY, STONE BASKET EXTRACTION AND LEFT URETERAL STENT PLACEMENT;  Surgeon: Franchot Gallo, MD;  Location: [facility];  Service: Urology;  Laterality: Left;  . ESOPHAGOGASTRODUODENOSCOPY ENDOSCOPY    . TONSILLECTOMY    . TUBAL LIGATION    . TYMPANOPLASTY        HPI  from the history and physical done on the day of admission:    Chief Complaint: Fever  HPI: Isabella Buchanan is a 38 y.o. female with medical history significant of hypertension,  hyperlipidemia, who presented to the emergency room with complaints of worsening dysuria, nausea  and fevers.  She was evaluated yesterday in the emergency room for the same complaints and found to have sepsis with urinary tract infection.  She received a dose of meropenem, IV fluids.  She initially felt better and decided to leave the hospital [place].  Since returning home, she had continued fevers.  She was called by the hospital today to inform her that her blood cultures had resulted positive and was advised to return to the hospital for further treatments.  She reports continued pain in her left flank, as well as her lower abdomen.  She has dysuria for the past week.  She has been having high fevers since yesterday morning.  Please refer to history and physical done by Dr. Manuella Ghazi on 12/26 for further  details.  ED Course: She was noted to be febrile in the emergency room.  She is tachycardic, blood pressure stable.  WBC count has shown improvement since yesterday.  Renal function is stable.  Lactic acid has trended down.     Hospital Course:   Brief Narrative:  38 y.o.femalewith medical history significant of hypertension, hyperlipidemia, who presented to the emergency room with complaints of worsening dysuria, nausea and fevers. She was evaluated yesterday in the emergency room for the same complaints and found to have sepsis with urinary tract infection. She received a dose of meropenem, IV fluids. She initially felt better and decided to leave the hospital [place]. Since returning home, she had continued fevers. She was called by the hospital today to inform her that her blood cultures had resulted positive and was advised to return to the hospital for further treatments. She reports continued pain in her left flank, as well as her lower abdomen. She has dysuria for the past week. She has been having high fevers since yesterday morning.    Assessment & Plan: 1-sepsis secondary to E. coli bacteremia/UTI -Based on sensitivity microorganism is resistant to ampicillin, ciprofloxacin and Bactrim. -Patient with anaphylactic reaction to penicillin -After discussing with infectious disease provider recommendations given for IV meropenem as antibiotic of choice and looking to treat for a total of 7 days. 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 -Resume  Metformin  4-HTN-resume valsartan, renal function has normalized  5-gastroesophageal reflux disease -Continue PPI.  6-hyperlipidemia/hypertriglyceridemia \ use of Lipitor and fenofibrate  7-history of asthma/chronic bronchitis-no acute flareup -Resume Singulair    Code Status: Full code Disposition -discharge home with PICC line and home health RN for IV antibiotic through 11/21/2018.Marland Kitchen  Consultants:   Infectious disease curbside (Dr. Tommy Medal); recommendations given for 7 days of IV meropenem/Carbapenem and.  Procedures:   See below for x-ray reports.  Discharge Condition: Stable  Follow UP--- PCP as advised  Diet and Activity recommendation:  As advised  Discharge Instructions    Discharge Instructions    Call MD for:  difficulty breathing, headache or visual disturbances   Complete by: As directed    Call MD for:  persistant dizziness or light-headedness   Complete by: As directed    Call MD for:  persistant nausea and vomiting   Complete by: As directed  Call MD for:  severe uncontrolled pain   Complete by: As directed    Call MD for:  temperature >100.4   Complete by: As directed    Diet - low sodium heart healthy   Complete by: As directed    Diet Carb Modified   Complete by: As directed    Discharge instructions   Complete by: As directed    1) IV antibiotic/Invanz/Ertapenem as prescribed through November 21, 2018 2) smoking cessation strongly advised--- may use over-the-counter nicotine patch to help you quit smoking 3) drink plenty of fluids--- avoid dehydration 4) follow-up with the primary care physician on Monday November 24, 2019 for repeat CBC and BMP test 5) Home health registered nurse to pull (discontinue) right arm PICC line per protocol on 11/21/2018 after completing Invanz infusion   Home infusion instructions   Complete by: As directed    1) IV antibiotic/Invanz/Ertapenem as prescribed through November 21, 2018 2) Home health registered  nurse to pull (discontinue) right arm PICC line per protocol on 11/21/2018 after completing Invanz infusion   Instructions: Flushing of vascular access device: 0.9% NaCl pre/post medication administration and prn patency; Heparin 100 u/ml, 8m for implanted ports and Heparin 10u/ml, 569mfor all other central venous catheters.   Increase activity slowly   Complete by: As directed        Discharge Medications     Allergies as of 11/19/2019      Reactions   Omnicef [cefdinir] Anaphylaxis   Swelling of tongue   Amoxicillin Nausea And Vomiting, Rash   Celebrex [celecoxib] Nausea And Vomiting   Codeine Nausea Only   Stomach aches   Penicillins Nausea And Vomiting, Rash   .Has patient had a PCN reaction causing immediate rash, facial/tongue/throat swelling, SOB or lightheadedness with hypotension: unknown Has patient had a PCN reaction causing severe rash involving mucus membranes or skin necrosis: unknown Has patient had a PCN reaction that required hospitalization: unknown Has patient had a PCN reaction occurring within the last 10 years: no If all of the above answers are "NO", then may proceed with Cephalosporin use.   Sulfa Antibiotics Nausea And Vomiting      Medication List    STOP taking these medications   famotidine 20 MG tablet Commonly known as: Pepcid   HYDROcodone-acetaminophen 5-325 MG tablet Commonly known as: NORCO/VICODIN   tiZANidine 4 MG tablet Commonly known as: ZANAFLEX     TAKE these medications   acetaminophen 325 MG tablet Commonly known as: TYLENOL Take 2 tablets (650 mg total) by mouth every 6 (six) hours as needed for mild pain or fever.   atorvastatin 20 MG tablet Commonly known as: LIPITOR Take 20 mg by mouth daily.   clonazePAM 0.5 MG tablet Commonly known as: KLONOPIN Take 0.25-0.5 mg by mouth 2 (two) times daily as needed.   ertapenem  IVPB Commonly known as: INVANZ Inject 1 g into the vein daily for 3 days. Indication:  Bacteremia/UTI  with resistance/allergies Last Day of Therapy:  11/22/2019 Labs - Once weekly:  CBC/D and BMP, Labs - Every other week:  ESR and CRP Start taking on: November 20, 2019   esomeprazole 40 MG capsule Commonly known as: NEXIUM Take 1 capsule (40 mg total) by mouth daily with breakfast. What changed: when to take this   fenofibrate 160 MG tablet Take 160 mg by mouth daily.   furosemide 20 MG tablet Commonly known as: LASIX Take 20 mg by mouth as needed.   gabapentin 300 MG  capsule Commonly known as: NEURONTIN Take 300 mg by mouth 2 (two) times a day.   metFORMIN 500 MG 24 hr tablet Commonly known as: GLUCOPHAGE-XR Take 1 tablet (500 mg total) by mouth daily with breakfast. What changed: when to take this   mometasone-formoterol 200-5 MCG/ACT Aero Commonly known as: DULERA Inhale 2 puffs into the lungs 2 (two) times daily.   montelukast 10 MG tablet Commonly known as: SINGULAIR Take 10 mg by mouth at bedtime.   ondansetron 4 MG tablet Commonly known as: Zofran Take 1 tablet (4 mg total) by mouth every 6 (six) hours as needed for nausea or vomiting.   pramipexole 1 MG tablet Commonly known as: MIRAPEX Take 1.5 mg by mouth at bedtime.   valsartan 80 MG tablet Commonly known as: DIOVAN Take 80 mg by mouth daily.            Home Infusion Instuctions  (From admission, onward)         Start     Ordered   11/19/19 0000  Home infusion instructions    Comments: 1) IV antibiotic/Invanz/Ertapenem as prescribed through November 21, 2018 2) Home health registered nurse to pull (discontinue) right arm PICC line per protocol on 11/21/2018 after completing Invanz infusion  Question:  Instructions  Answer:  Flushing of vascular access device: 0.9% NaCl pre/post medication administration and prn patency; Heparin 100 u/ml, 104m for implanted ports and Heparin 10u/ml, 573mfor all other central venous catheters.   11/19/19 1116          Major procedures and Radiology Reports -  PLEASE review detailed and final reports for all details, in brief -   CT ABDOMEN PELVIS WO CONTRAST  Result Date: 11/15/2019 CLINICAL DATA:  Fever and flank pain starting this morning EXAM: CT ABDOMEN AND PELVIS WITHOUT CONTRAST TECHNIQUE: Multidetector CT imaging of the abdomen and pelvis was performed following the standard protocol without IV contrast. COMPARISON:  September 19, 2017 FINDINGS: Lower chest: No acute abnormality. Hepatobiliary: Diffuse low density of liver is identified without focal liver lesion. Patient status post prior cholecystectomy. The biliary tree is normal. Pancreas: Unremarkable. No pancreatic ductal dilatation or surrounding inflammatory changes. Spleen: Normal in size without focal abnormality. Adrenals/Urinary Tract: The bilateral adrenal glands are normal. There is a 2 mm nonobstructing stone in a left upper pole calyx. There is mild left hydronephrosis but no focal discrete obstructing stone is identified in the left collecting system. The right kidney is normal. Bladder is normal. Stomach/Bowel: Stomach is within normal limits. Appendix appears normal. No evidence of bowel wall thickening, distention, or inflammatory changes. Vascular/Lymphatic: Aortic atherosclerosis. No enlarged abdominal or pelvic lymph nodes. Reproductive: Uterus and bilateral adnexa are unremarkable. Other: None. Musculoskeletal: No acute abnormality. IMPRESSION: 1. Mild left hydronephrosis but no focal discrete obstructing stone is identified in the left collecting system. 2. Nonobstructing stone in the left upper pole calyx. 3. Fatty infiltration of liver. Electronically Signed   By: WeAbelardo Diesel.D.   On: 11/15/2019 10:36   [place] SITE RITE  Result Date: 11/18/2019 If Site Rite image not attached, placement could not be confirmed due to current cardiac rhythm.   Micro Results    Recent Results (from the past 240 hour(s))  Blood Culture (routine x 2)     Status: None (Preliminary result)    Collection Time: 11/15/19  7:14 AM   Specimen: Right Antecubital; Blood  Result Value Ref Range Status   Specimen Description   Final  RIGHT ANTECUBITAL BOTTLES DRAWN AEROBIC AND ANAEROBIC   Special Requests Blood Culture adequate volume  Final   Culture   Final    NO GROWTH 4 DAYS Performed at [facility], [address], [place], [place] [address]    Report Status PENDING  Incomplete  Blood Culture (routine x 2)     Status: Abnormal   Collection Time: 11/15/19  7:19 AM   Specimen: Left Antecubital; Blood  Result Value Ref Range Status   Specimen Description   Final    LEFT ANTECUBITAL BOTTLES DRAWN AEROBIC AND ANAEROBIC Performed at [facility], [address] [place], [place] [address]    Special Requests   Final    Blood Culture adequate volume Performed at [facility], [address] [place], [place] [address]    Culture  Setup Time   Final    GRAM NEGATIVE RODS Gram Stain Report Called to,Read Back By and Verified With: [place]. AT 2312 ON 11/15/2019 BY EVA AEROBIC BOTTLE ONLY Performed at [place] (A)  Final   Report Status 11/18/2019 FINAL  Final   Organism ID, Bacteria ESCHERICHIA COLI  Final      Susceptibility   Escherichia coli - MIC*    AMPICILLIN >=32 RESISTANT Resistant     CEFAZOLIN <=4 SENSITIVE Sensitive     CEFEPIME <=1 SENSITIVE Sensitive     CEFTAZIDIME <=1 SENSITIVE Sensitive     CEFTRIAXONE <=1 SENSITIVE Sensitive     CIPROFLOXACIN >=4 RESISTANT Resistant     GENTAMICIN <=1 SENSITIVE Sensitive     IMIPENEM <=0.25 SENSITIVE Sensitive     TRIMETH/SULFA >=320 RESISTANT Resistant     AMPICILLIN/SULBACTAM 16 INTERMEDIATE Intermediate     PIP/TAZO <=4 SENSITIVE Sensitive     * ESCHERICHIA COLI  Blood Culture ID Panel (Reflexed)     Status: Abnormal   Collection Time: 11/15/19  7:19 AM  Result Value Ref Range Status   Enterococcus species NOT DETECTED NOT DETECTED Final   Listeria monocytogenes NOT DETECTED  NOT DETECTED Final   Staphylococcus species NOT DETECTED NOT DETECTED Final   Staphylococcus aureus (BCID) NOT DETECTED NOT DETECTED Final   Streptococcus species NOT DETECTED NOT DETECTED Final   Streptococcus agalactiae NOT DETECTED NOT DETECTED Final   Streptococcus pneumoniae NOT DETECTED NOT DETECTED Final   Streptococcus pyogenes NOT DETECTED NOT DETECTED Final   Acinetobacter baumannii NOT DETECTED NOT DETECTED Final   Enterobacteriaceae species DETECTED (A) NOT DETECTED Final    Comment: Enterobacteriaceae represent a large family of gram-negative bacteria, not a single organism. CRITICAL RESULT CALLED TO, READ BACK BY AND VERIFIED WITH: Cristy Friedlander RN @ 0500 ON 11/16/19 BY ROBINSON Z.    Enterobacter cloacae complex NOT DETECTED NOT DETECTED Final   Escherichia coli DETECTED (A) NOT DETECTED Final    Comment: CRITICAL RESULT CALLED TO, READ BACK BY AND VERIFIED WITH: Cristy Friedlander RN @ 0500 ON 11/16/19 BY ROBINSON Z.    Klebsiella oxytoca NOT DETECTED NOT DETECTED Final   Klebsiella pneumoniae NOT DETECTED NOT DETECTED Final   Proteus species NOT DETECTED NOT DETECTED Final   Serratia marcescens NOT DETECTED NOT DETECTED Final   Carbapenem resistance NOT DETECTED NOT DETECTED Final   Haemophilus influenzae NOT DETECTED NOT DETECTED Final   Neisseria meningitidis NOT DETECTED NOT DETECTED Final   Pseudomonas aeruginosa NOT DETECTED NOT DETECTED Final   Candida albicans NOT DETECTED NOT DETECTED Final   Candida glabrata NOT DETECTED NOT DETECTED Final   Candida  krusei NOT DETECTED NOT DETECTED Final   Candida parapsilosis NOT DETECTED NOT DETECTED Final   Candida tropicalis NOT DETECTED NOT DETECTED Final    Comment: Performed at [facility], [place] [address] [place], [place] [address]  Urine culture     Status: Abnormal   Collection Time: 11/15/19  7:35 AM   Specimen: Urine, Clean Catch  Result Value Ref Range Status   Specimen Description   Final    URINE,  CLEAN CATCH Performed at [facility], [address] [place], [place] [address]    Special Requests   Final    NONE Performed at [facility], [address] [place], [place] [address]    Culture >=100,000 COLONIES/mL ESCHERICHIA COLI (A)  Final   Report Status 11/17/2019 FINAL  Final   Organism ID, Bacteria ESCHERICHIA COLI (A)  Final      Susceptibility   Escherichia coli - MIC*    AMPICILLIN >=32 RESISTANT Resistant     CEFAZOLIN <=4 SENSITIVE Sensitive     CEFTRIAXONE <=1 SENSITIVE Sensitive     CIPROFLOXACIN >=4 RESISTANT Resistant     GENTAMICIN <=1 SENSITIVE Sensitive     IMIPENEM <=0.25 SENSITIVE Sensitive     NITROFURANTOIN <=16 SENSITIVE Sensitive     TRIMETH/SULFA >=320 RESISTANT Resistant     AMPICILLIN/SULBACTAM 16 INTERMEDIATE Intermediate     PIP/TAZO <=4 SENSITIVE Sensitive     * >=100,000 COLONIES/mL ESCHERICHIA COLI  SARS CORONAVIRUS 2 (TAT 6-24 HRS) Nasopharyngeal Nasopharyngeal Swab     Status: None   Collection Time: 11/16/19 12:31 PM   Specimen: Nasopharyngeal Swab  Result Value Ref Range Status   SARS Coronavirus 2 NEGATIVE NEGATIVE Final    Comment: (NOTE) SARS-CoV-2 target nucleic acids are NOT DETECTED. The SARS-CoV-2 RNA is generally detectable in upper and lower respiratory specimens during the acute phase of infection. Negative results do not preclude SARS-CoV-2 infection, do not rule out co-infections with other pathogens, and should not be used as the sole basis for treatment or other patient management decisions. Negative results must be combined with clinical observations, patient history, and epidemiological information. The expected result is Negative. Fact Sheet for Patients: SugarRoll.be Fact Sheet for Healthcare Providers: https://www.woods-mathews.com/ This test is not yet approved or cleared by the [place] FDA and  has been authorized for detection and/or diagnosis of SARS-CoV-2 by FDA  under an Emergency Use Authorization (EUA). This EUA will remain  in effect (meaning this test can be used) for the duration of the COVID-19 declaration under Section 56 4(b)(1) of the Act, 21 U.S.C. section 360bbb-3(b)(1), unless the authorization is terminated or revoked sooner. Performed at [facility], [place] [address] [place], [place] [address]        Today   Subjective    Etrulia Porco today has no new concerns -Eager to go home, inpatient, requesting discharge home ASAP No fever  Or chills   No Nausea, Vomiting or Diarrhea        Patient has been seen and examined prior to discharge   Objective   Blood pressure 116/82, pulse 70, temperature 97.8 F (36.6 C), temperature source Oral, resp. rate 16, height '5\' 6"'  (1.676 m), weight 96.2 kg, last menstrual period 10/21/2019, SpO2 99 %.   Intake/Output Summary (Last 24 hours) at 11/19/2019 1116 Last data filed at 11/19/2019 0600 Gross per 24 hour  Intake 2374.97 ml  Output --  Net 2374.97 ml    Exam Gen:- Awake Alert, no acute distress  HEENT:- [place].AT, No sclera icterus Neck-Supple Neck,No JVD,.  Lungs-  CTAB , good air movement bilaterally  CV- S1, S2 normal, regular Abd-  +ve B.Sounds, Abd Soft, No tenderness,    Extremity/Skin:- No  edema,   good pulses Psych-affect is appropriate, oriented x3 Neuro-no new focal deficits, no tremors  MSK- Picc line site is Clean/Dry/Intact    Data Review   CBC w Diff:  Lab Results  Component Value Date   WBC 8.8 11/17/2019   HGB 11.9 (L) 11/17/2019   HCT 38.4 11/17/2019   PLT 150 11/17/2019   LYMPHOPCT 6 11/16/2019   BANDSPCT 0 08/14/2014   MONOPCT 4 11/16/2019   EOSPCT 1 11/16/2019   BASOPCT 0 11/16/2019    CMP:  Lab Results  Component Value Date   NA 137 11/18/2019   K 3.8 11/18/2019   CL 105 11/18/2019   CO2 21 (L) 11/18/2019   BUN 9 11/18/2019   CREATININE 0.58 11/18/2019   CREATININE 0.77 08/08/2016   PROT 5.9 (L) 11/17/2019   ALBUMIN 2.8  (L) 11/17/2019   BILITOT 0.5 11/17/2019   ALKPHOS 115 11/17/2019   AST 110 (H) 11/17/2019   ALT 59 (H) 11/17/2019  .   Total Discharge time is about 33 minutes  Roxan Hockey M.D on 11/19/2019 at 11:16 AM  Go to [email or web address] -  for contact info  [facility] - Office  [phone]

## 2019-11-19 NOTE — Discharge Instructions (Signed)
1) IV antibiotic/Invanz/Ertapenem as prescribed through November 21, 2018 2) smoking cessation strongly advised--- may use over-the-counter nicotine patch to help you quit smoking 3) drink plenty of fluids--- avoid dehydration 4) follow-up with the primary care physician on Monday November 24, 2019 for repeat CBC and BMP test 5) Home health registered nurse to pull (discontinue) right arm PICC line per protocol on 11/21/2018 after completing Invanz infusion

## 2019-11-20 LAB — CULTURE, BLOOD (ROUTINE X 2)
Culture: NO GROWTH
Special Requests: ADEQUATE

## 2020-01-31 DIAGNOSIS — N3010 Interstitial cystitis (chronic) without hematuria: Secondary | ICD-10-CM | POA: Insufficient documentation

## 2020-01-31 DIAGNOSIS — E282 Polycystic ovarian syndrome: Secondary | ICD-10-CM | POA: Insufficient documentation

## 2020-05-10 DIAGNOSIS — E119 Type 2 diabetes mellitus without complications: Secondary | ICD-10-CM | POA: Insufficient documentation

## 2020-10-27 DIAGNOSIS — H722X1 Other marginal perforations of tympanic membrane, right ear: Secondary | ICD-10-CM | POA: Insufficient documentation

## 2020-11-30 ENCOUNTER — Telehealth: Payer: Self-pay | Admitting: Adult Health

## 2020-11-30 NOTE — Telephone Encounter (Signed)
Pt called to check on her referral, we searched - we do not have a referral for this pt Called pt back & asked her to have her PCP to re-send so we can review & get her scheduled, pt verbalized understanding

## 2020-12-11 ENCOUNTER — Emergency Department (HOSPITAL_COMMUNITY)
Admission: EM | Admit: 2020-12-11 | Discharge: 2020-12-11 | Disposition: A | Discharge status: Home / Self Care | Payer: Medicaid Other | Attending: Emergency Medicine | Admitting: Emergency Medicine

## 2020-12-11 ENCOUNTER — Encounter (HOSPITAL_COMMUNITY): Payer: Self-pay | Admitting: *Deleted

## 2020-12-11 ENCOUNTER — Other Ambulatory Visit: Payer: Self-pay

## 2020-12-11 DIAGNOSIS — Z87442 Personal history of urinary calculi: Secondary | ICD-10-CM | POA: Insufficient documentation

## 2020-12-11 DIAGNOSIS — Z9049 Acquired absence of other specified parts of digestive tract: Secondary | ICD-10-CM | POA: Insufficient documentation

## 2020-12-11 DIAGNOSIS — Z1152 Encounter for screening for COVID-19: Secondary | ICD-10-CM

## 2020-12-11 DIAGNOSIS — Z79899 Other long term (current) drug therapy: Secondary | ICD-10-CM | POA: Diagnosis not present

## 2020-12-11 DIAGNOSIS — U071 COVID-19: Secondary | ICD-10-CM | POA: Insufficient documentation

## 2020-12-11 DIAGNOSIS — I10 Essential (primary) hypertension: Secondary | ICD-10-CM | POA: Diagnosis not present

## 2020-12-11 DIAGNOSIS — R197 Diarrhea, unspecified: Secondary | ICD-10-CM | POA: Diagnosis present

## 2020-12-11 DIAGNOSIS — R1030 Lower abdominal pain, unspecified: Secondary | ICD-10-CM | POA: Insufficient documentation

## 2020-12-11 DIAGNOSIS — R112 Nausea with vomiting, unspecified: Secondary | ICD-10-CM

## 2020-12-11 DIAGNOSIS — Z960 Presence of urogenital implants: Secondary | ICD-10-CM | POA: Diagnosis not present

## 2020-12-11 DIAGNOSIS — F17210 Nicotine dependence, cigarettes, uncomplicated: Secondary | ICD-10-CM | POA: Diagnosis not present

## 2020-12-11 DIAGNOSIS — K219 Gastro-esophageal reflux disease without esophagitis: Secondary | ICD-10-CM | POA: Diagnosis not present

## 2020-12-11 HISTORY — DX: Papillomavirus as the cause of diseases classified elsewhere: B97.7

## 2020-12-11 LAB — COMPREHENSIVE METABOLIC PANEL
ALT: 62 U/L — ABNORMAL HIGH (ref 0–44)
AST: 120 U/L — ABNORMAL HIGH (ref 15–41)
Albumin: 3.9 g/dL (ref 3.5–5.0)
Alkaline Phosphatase: 114 U/L (ref 38–126)
Anion gap: 10 (ref 5–15)
BUN: 17 mg/dL (ref 6–20)
CO2: 26 mmol/L (ref 22–32)
Calcium: 8.9 mg/dL (ref 8.9–10.3)
Chloride: 99 mmol/L (ref 98–111)
Creatinine, Ser: 0.8 mg/dL (ref 0.44–1.00)
GFR, Estimated: >60 mL/min (ref >60)
Glucose, Bld: 195 mg/dL — ABNORMAL HIGH (ref 70–99)
Potassium: 3.7 mmol/L (ref 3.5–5.1)
Sodium: 135 mmol/L (ref 135–145)
Total Bilirubin: 0.4 mg/dL (ref 0.3–1.2)
Total Protein: 7.6 g/dL (ref 6.5–8.1)

## 2020-12-11 LAB — LIPASE, BLOOD: Lipase: 49 U/L (ref 11–51)

## 2020-12-11 LAB — CBC
HCT: 46.2 % — ABNORMAL HIGH (ref 36.0–46.0)
Hemoglobin: 14.4 g/dL (ref 12.0–15.0)
MCH: 27.4 pg (ref 26.0–34.0)
MCHC: 31.2 g/dL (ref 30.0–36.0)
MCV: 87.8 fL (ref 80.0–100.0)
Platelets: 243 10*3/uL (ref 150–400)
RBC: 5.26 MIL/uL — ABNORMAL HIGH (ref 3.87–5.11)
RDW: 14.5 % (ref 11.5–15.5)
WBC: 8.6 10*3/uL (ref 4.0–10.5)
nRBC: 0 % (ref 0.0–0.2)

## 2020-12-11 MED ORDER — SODIUM CHLORIDE 0.9 % IV BOLUS
1000.0000 mL | Freq: Once | INTRAVENOUS | Status: AC
  Administered 2020-12-11: 1000 mL via INTRAVENOUS

## 2020-12-11 MED ORDER — ACETAMINOPHEN 325 MG PO TABS
650.0000 mg | ORAL_TABLET | Freq: Once | ORAL | Status: AC
  Administered 2020-12-11: 650 mg via ORAL
  Filled 2020-12-11: qty 2

## 2020-12-11 MED ORDER — ONDANSETRON HCL 4 MG/2ML IJ SOLN
4.0000 mg | Freq: Once | INTRAMUSCULAR | Status: AC
  Administered 2020-12-11: 4 mg via INTRAVENOUS
  Filled 2020-12-11: qty 2

## 2020-12-11 NOTE — ED Notes (Signed)
Water given for po challenge.

## 2020-12-11 NOTE — ED Notes (Signed)
Given fluids.   

## 2020-12-11 NOTE — ED Provider Notes (Signed)
[facility] Provider Note   CSN: [id number] Arrival date & time: 12/11/20  1311     History Chief Complaint  Patient presents with  . Emesis    Isabella Buchanan is a 40 y.o. female.  HPI     Isabella Buchanan is a 40 y.o. female who presents to the Emergency Department complaining of nausea vomiting and diarrhea that began last evening.  She reports multiple episodes of watery diarrhea no black or bloody stools.  This morning she woke with fever and chills and generalized body aches.  Max temp at home at 4 AM was 101.  She took Tylenol.  She states that today her vomiting has subsided and she has tolerated small amounts of soda.  She continues to have body aches and some lower abdominal discomfort.  No known COVID exposures but states that several coworkers have had COVID.  She is unvaccinated for COVID.  She denies chest pain, cough, shortness of breath.  She was admitted here on 11/16/2023 sepsis and pyelonephritis.  She currently denies flank pain or dysuria.   Past Medical History:  Diagnosis Date  . Anxiety   . Chronic back pain   . Chronic flank pain   . Chronic pain   . DOE (dyspnea on exertion)   . Fibromyalgia   . GERD (gastroesophageal reflux disease)   . Headache(784.0)    mygrains  . Hiatal hernia   . History of electroencephalogram 11/2013   normal EEG, "psychogenic nonepileptic spell" during EEG  . HPV (human papilloma virus) infection   . Hypertension   . IBS (irritable bowel syndrome)   . Interstitial cystitis   . Kidney stones   . Polycystic ovarian syndrome   . PONV (postoperative nausea and vomiting)   . Restless leg   . Seizures (HCC)   . Swelling     Patient Active Problem List   Diagnosis Date Noted  . Sepsis secondary to UTI (HCC) 11/17/2019  . E coli bacteremia 11/16/2019  . Acute pyelonephritis 11/16/2019  . Asthmatic bronchitis , chronic (HCC) 05/12/2019  . Ureteral calculus, left 08/06/2017  . Morbid obesity (HCC)  complicated by hyperlipid/hbp 08/02/2016  . Hypertriglyceridemia without hypercholesterolemia 08/02/2016  . Cigarette smoker 08/02/2016  . Essential hypertriglyceridemia 06/30/2015  . Combined fat and carbohydrate induced hyperlipemia 06/30/2015  . Nicotine addiction 06/28/2015  . Cyclic vomiting syndrome 06/28/2015  . Avitaminosis D 06/28/2015  . Essential hypertension 05/04/2015  . Degeneration of intervertebral disc of lumbar region 02/05/2015  . Anxiety, generalized 02/05/2015  . Community acquired pneumonia 07/17/2014  . CAP (community acquired pneumonia) 07/17/2014  . Sepsis (HCC) 07/17/2014  . Encephalopathy 07/17/2014  . Acute respiratory failure (HCC) 07/17/2014  . Seizure (HCC) 12/13/2013  . Seizures (HCC) 12/13/2013  . History of IBS 12/13/2013  . Fibromyalgia   . Chronic back pain   . Interstitial cystitis   . Polycystic ovarian syndrome     Past Surgical History:  Procedure Laterality Date  . CHOLECYSTECTOMY    . CYSTOSCOPY W/ URETERAL STENT PLACEMENT Left 08/06/2017   Procedure: CYSTOSCOPY WITH RETROGRADE PYELOGRAM/URETERAL STENT PLACEMENT;  Surgeon: Marcine Matarahlstedt, Stephen, MD;  Location: [facility];  Service: Urology;  Laterality: Left;  . CYSTOSCOPY/URETEROSCOPY/HOLMIUM LASER/STENT PLACEMENT Left 08/21/2017   Procedure: CYSTOSCOPY,LEFT URETERAL STENT REMOVAL, LEFT URETEROSCOPY, STONE BASKET EXTRACTION AND LEFT URETERAL STENT PLACEMENT;  Surgeon: Marcine Matarahlstedt, Stephen, MD;  Location: [facility];  Service: Urology;  Laterality: Left;  . ESOPHAGOGASTRODUODENOSCOPY ENDOSCOPY    . TONSILLECTOMY    .  TUBAL LIGATION    . TYMPANOPLASTY       OB History    Gravida  2   Para  2   Term      Preterm  2   AB      Living  2     SAB      IAB      Ectopic      Multiple      Live Births              Family History  Problem Relation Age of Onset  . Hypertension Father   . Diabetes Father   . Seizures Father   . Allergies Father   . Cancer Other   . Diabetes  Other   . Lymphoma Maternal Grandmother   . Heart disease Maternal Grandmother   . Leukemia Maternal Grandfather     Social History   Tobacco Use  . Smoking status: Current Every Day Smoker    Packs/day: 1.00    Years: 23.00    Pack years: 23.00    Types: Cigarettes  . Smokeless tobacco: Never Used  Vaping Use  . Vaping Use: Never used  Substance Use Topics  . Alcohol use: Yes    Comment: rarely  . Drug use: No    Home Medications Prior to Admission medications   Medication Sig Start Date End Date Taking? Authorizing Provider  acetaminophen (TYLENOL) 325 MG tablet Take 2 tablets (650 mg total) by mouth every 6 (six) hours as needed for mild pain or fever. 11/19/19   Shon Hale, MD  atorvastatin (LIPITOR) 20 MG tablet Take 20 mg by mouth daily.    [provider]  clonazePAM (KLONOPIN) 0.5 MG tablet Take 0.25-0.5 mg by mouth 2 (two) times daily as needed. 08/08/17   [provider]  esomeprazole (NEXIUM) 40 MG capsule Take 1 capsule (40 mg total) by mouth daily with breakfast. 11/19/19   Mariea Clonts, Courage, MD  fenofibrate 160 MG tablet Take 160 mg by mouth daily.    [provider]  furosemide (LASIX) 20 MG tablet Take 20 mg by mouth as needed.    [provider]  gabapentin (NEURONTIN) 300 MG capsule Take 300 mg by mouth 2 (two) times a day. 05/02/19   [provider]  metFORMIN (GLUCOPHAGE-XR) 500 MG 24 hr tablet Take 1 tablet (500 mg total) by mouth daily with breakfast. 11/19/19   Emokpae, Courage, MD  mometasone-formoterol (DULERA) 200-5 MCG/ACT AERO Inhale 2 puffs into the lungs 2 (two) times daily. 07/02/19   Nyoka Cowden, MD  montelukast (SINGULAIR) 10 MG tablet Take 10 mg by mouth at bedtime.    [provider]  ondansetron (ZOFRAN) 4 MG tablet Take 1 tablet (4 mg total) by mouth every 6 (six) hours as needed for nausea or vomiting. 10/12/17   Lavera Guise, MD  pramipexole (MIRAPEX) 1 MG tablet Take 1.5 mg by  mouth at bedtime.     [provider]  valsartan (DIOVAN) 80 MG tablet Take 80 mg by mouth daily.    [provider]    Allergies    Omnicef [cefdinir], Amoxicillin, Celebrex [celecoxib], Codeine, Penicillins, and Sulfa antibiotics  Review of Systems   Review of Systems  Constitutional: Positive for chills and fever. Negative for appetite change.  Respiratory: Negative for cough and shortness of breath.   Cardiovascular: Negative for chest pain.  Gastrointestinal: Positive for abdominal pain, diarrhea, nausea and vomiting. Negative for blood in stool.  Genitourinary: Negative for difficulty urinating, dysuria, flank pain, pelvic pain, vaginal bleeding and vaginal discharge.  Musculoskeletal: Positive for myalgias. Negative for back pain.  Skin: Negative for color change and rash.  Neurological: Negative for dizziness, weakness and numbness.  Hematological: Negative for adenopathy.    Physical Exam Updated Vital Signs BP 103/64   Pulse 90   Temp 99.4 F (37.4 C) (Oral)   Resp 15   Ht 5\' 6"  (1.676 m)   Wt 93.9 kg   LMP 11/14/2020   SpO2 98%   BMI 33.41 kg/m   Physical Exam Vitals and nursing note reviewed.  Constitutional:      General: She is not in acute distress.    Appearance: Normal appearance. She is not toxic-appearing.  Eyes:     Conjunctiva/sclera: Conjunctivae normal.  Cardiovascular:     Rate and Rhythm: Normal rate.     Pulses: Normal pulses.  Pulmonary:     Effort: Pulmonary effort is normal.     Breath sounds: Normal breath sounds.  Chest:     Chest wall: No tenderness.  Abdominal:     Palpations: Abdomen is soft. There is no mass.     Tenderness: There is abdominal tenderness.     Comments: Diffuse tenderness of the lower abdomen.  No guarding or rebound tenderness.  Abdomen is soft.  Musculoskeletal:        General: Normal range of motion.     Cervical back: Normal range of motion.     Right lower leg: No edema.     Left lower  leg: No edema.  Lymphadenopathy:     Cervical: No cervical adenopathy.  Skin:    General: Skin is warm.     Capillary Refill: Capillary refill takes less than 2 seconds.  Neurological:     General: No focal deficit present.     Mental Status: She is alert.     Sensory: No sensory deficit.     Motor: No weakness.     ED Results / Procedures / Treatments   Labs (all labs ordered are listed, but only abnormal results are displayed) Labs Reviewed  COMPREHENSIVE METABOLIC PANEL - Abnormal; Notable for the following components:      Result Value   Glucose, Bld 195 (*)    AST 120 (*)    ALT 62 (*)    All other components within normal limits  CBC - Abnormal; Notable for the following components:   RBC 5.26 (*)    HCT 46.2 (*)    All other components within normal limits  SARS CORONAVIRUS 2 (TAT 6-24 HRS)  LIPASE, BLOOD  URINALYSIS, ROUTINE W REFLEX MICROSCOPIC  PREGNANCY, URINE    EKG None  Radiology No results found.  Procedures Procedures (including critical care time)  Medications Ordered in ED Medications - No data to display  ED Course  I have reviewed the triage vital signs and the nursing notes.  Pertinent labs & imaging results that were available during my care of the patient were reviewed by me and considered in my medical decision making (see chart for details).    MDM Rules/Calculators/A&P                          Patient here for evaluation of nausea, vomiting, and diarrhea last night and woke this morning with fever, chills and generalized body aches.  Symptoms suggestive of COVID.  She is unvaccinated.  On exam, she does have some  mild, diffuse lower abdominal tenderness without guarding or rebound.  No active vomiting or diarrhea.  Vital signs are reassuring.  No hypoxia, tachycardia or tachypnea.  We will proceed with IV fluids and check labs.   On recheck, pt reports feeling better and tolerated oral fluid challenge.  Serial abdominal exams and  pain has improved.  Low clinical suspicion for acute surgical abdomen and doubt need for emergent abd imaging at this time as I feel her sx's are more suggestive of covid.  She is requesting discharge home.  U/a and preg was ordered, but pt urinated and specimen was not collected and she does not want stay to have it collected.  She denies having any urinary symptoms and she agrees to return if her covid is negative and her symptoms worsen.  Her covid test is pending.  She agrees to home isolation instructions as discussed.  Strict return precautions given.    Isabella Buchanan was evaluated in Emergency Department on 12/13/2020 for the symptoms described in the history of present illness. She was evaluated in the context of the global COVID-19 pandemic, which necessitated consideration that the patient might be at risk for infection with the SARS-CoV-2 virus that causes COVID-19. Institutional protocols and algorithms that pertain to the evaluation of patients at risk for COVID-19 are in a state of rapid change based on information released by regulatory bodies including the CDC and federal and state organizations. These policies and algorithms were followed during the patient's care in the ED.   Final Clinical Impression(s) / ED Diagnoses Final diagnoses:  Nausea vomiting and diarrhea  Encounter for screening for COVID-19    Rx / DC Orders ED Discharge Orders    None       Pauline Aus, PA-C 12/13/20 1329    Pollyann Savoy, MD 12/16/20 [phone]

## 2020-12-11 NOTE — Discharge Instructions (Addendum)
Your COVID test is pending.  Your results should be back within 24 hours.  You may review the results on the MyChart app.  You will need to isolate at home until your results are back.  If positive, continue to isolate at home for 5 days.  On day 5 if you are symptoms are improving and you do not have a fever(without taking fever reducing medication) you may return to normal activities but continue to wear a mask when around others.  If on day 5 you are still having symptoms or fever you will need to continue to isolate for the full 10 days.  Follow-up with your primary doctor next week for recheck, or return to the emergency department if you develop worsening symptoms

## 2020-12-11 NOTE — ED Notes (Signed)
Given water, tolerating well.

## 2020-12-11 NOTE — ED Triage Notes (Signed)
Pt with emesis and diarrhea since last night.  Fever at home as high as 101 and took tylenol at 0300 this am.  + body aches.

## 2020-12-12 LAB — SARS CORONAVIRUS 2 (TAT 6-24 HRS): SARS Coronavirus 2: POSITIVE — AB

## 2020-12-13 ENCOUNTER — Telehealth: Payer: Self-pay | Admitting: Physician Assistant

## 2020-12-13 ENCOUNTER — Telehealth: Payer: Self-pay | Admitting: *Deleted

## 2020-12-13 NOTE — Telephone Encounter (Signed)
Called to discuss with patient about Covid symptoms and the use of a monoclonal antibody infusion for those with mild to moderate Covid symptoms and at a high risk of hospitalization.   Offer monoclonal antibody infusion, but declined. She will call us back if interested. Call back number given.  Preventative practices reviewed. Patient verbalized understanding.  Onset 1/ 20 Non vaccinated and boosted Risk factors: DM, HTN, BMI > 25, current smoker and asthma (was no Dulera and Singulair but run of rx due to non follow up).   [place], [place]  12/13/2020 3:52 PM

## 2020-12-13 NOTE — Telephone Encounter (Signed)
Called to discuss with patient about COVID-19 symptoms and the use of one of the available treatments for those with mild to moderate Covid symptoms and at a high risk of hospitalization.  Pt appears to qualify for outpatient treatment due to co-morbid conditions and/or a member of an at-risk group in accordance with the FDA Emergency Use Authorization.    Symptom onset: 12/09/20 Today is day 5 Vaccinated: no Booster? no Immunocompromised? no Qualifiers: HTN, asthma, BMI>25  Numerous Covid symptoms today including fatigue, body aches, fever, cough, SOB.  Isabella Buchanan

## 2021-01-20 ENCOUNTER — Emergency Department (HOSPITAL_COMMUNITY)
Admission: EM | Admit: 2021-01-20 | Discharge: 2021-01-20 | Disposition: A | Discharge status: Home / Self Care | Payer: Medicaid Other | Attending: Emergency Medicine | Admitting: Emergency Medicine

## 2021-01-20 ENCOUNTER — Other Ambulatory Visit: Payer: Self-pay

## 2021-01-20 ENCOUNTER — Encounter (HOSPITAL_COMMUNITY): Payer: Self-pay | Admitting: Emergency Medicine

## 2021-01-20 DIAGNOSIS — Z5321 Procedure and treatment not carried out due to patient leaving prior to being seen by health care provider: Secondary | ICD-10-CM | POA: Diagnosis not present

## 2021-01-20 DIAGNOSIS — R5383 Other fatigue: Secondary | ICD-10-CM | POA: Diagnosis not present

## 2021-01-20 DIAGNOSIS — R531 Weakness: Secondary | ICD-10-CM | POA: Diagnosis not present

## 2021-01-20 DIAGNOSIS — R202 Paresthesia of skin: Secondary | ICD-10-CM | POA: Insufficient documentation

## 2021-01-20 HISTORY — DX: Type 2 diabetes mellitus without complications: E11.9

## 2021-01-20 LAB — CBG MONITORING, ED: Glucose-Capillary: 258 mg/dL — ABNORMAL HIGH (ref 70–99)

## 2021-01-20 NOTE — ED Triage Notes (Signed)
Pt reports feeling fatigued and weak x 2 weeks; normally has bilateral hand and foot tingling-report today reports the back of her head and face feel tingly as well; reports she has been having elevated CBGs (200-300) x 1 week

## 2021-01-20 NOTE — ED Notes (Signed)
Pt states she feels fine and does not want to be seen. EDP made aware.

## 2021-02-16 ENCOUNTER — Emergency Department (HOSPITAL_COMMUNITY)
Admission: EM | Admit: 2021-02-16 | Discharge: 2021-02-16 | Disposition: A | Discharge status: Home / Self Care | Payer: Medicaid Other | Attending: Emergency Medicine | Admitting: Emergency Medicine

## 2021-02-16 ENCOUNTER — Emergency Department (HOSPITAL_COMMUNITY): Payer: Medicaid Other

## 2021-02-16 ENCOUNTER — Other Ambulatory Visit: Payer: Self-pay

## 2021-02-16 ENCOUNTER — Encounter (HOSPITAL_COMMUNITY): Payer: Self-pay | Admitting: Emergency Medicine

## 2021-02-16 DIAGNOSIS — Z7951 Long term (current) use of inhaled steroids: Secondary | ICD-10-CM | POA: Diagnosis not present

## 2021-02-16 DIAGNOSIS — K219 Gastro-esophageal reflux disease without esophagitis: Secondary | ICD-10-CM | POA: Diagnosis not present

## 2021-02-16 DIAGNOSIS — E781 Pure hyperglyceridemia: Secondary | ICD-10-CM | POA: Diagnosis not present

## 2021-02-16 DIAGNOSIS — R1011 Right upper quadrant pain: Secondary | ICD-10-CM | POA: Diagnosis not present

## 2021-02-16 DIAGNOSIS — E1169 Type 2 diabetes mellitus with other specified complication: Secondary | ICD-10-CM | POA: Insufficient documentation

## 2021-02-16 DIAGNOSIS — Z7984 Long term (current) use of oral hypoglycemic drugs: Secondary | ICD-10-CM | POA: Diagnosis not present

## 2021-02-16 DIAGNOSIS — G8929 Other chronic pain: Secondary | ICD-10-CM | POA: Insufficient documentation

## 2021-02-16 DIAGNOSIS — R197 Diarrhea, unspecified: Secondary | ICD-10-CM | POA: Insufficient documentation

## 2021-02-16 DIAGNOSIS — R112 Nausea with vomiting, unspecified: Secondary | ICD-10-CM

## 2021-02-16 DIAGNOSIS — J45909 Unspecified asthma, uncomplicated: Secondary | ICD-10-CM | POA: Insufficient documentation

## 2021-02-16 DIAGNOSIS — I10 Essential (primary) hypertension: Secondary | ICD-10-CM | POA: Insufficient documentation

## 2021-02-16 DIAGNOSIS — F17210 Nicotine dependence, cigarettes, uncomplicated: Secondary | ICD-10-CM | POA: Diagnosis not present

## 2021-02-16 DIAGNOSIS — Z79899 Other long term (current) drug therapy: Secondary | ICD-10-CM | POA: Insufficient documentation

## 2021-02-16 LAB — URINALYSIS, ROUTINE W REFLEX MICROSCOPIC
Bilirubin Urine: NEGATIVE
Glucose, UA: >=500 mg/dL — AB
Hgb urine dipstick: NEGATIVE
Ketones, ur: NEGATIVE mg/dL
Leukocytes,Ua: NEGATIVE
Nitrite: NEGATIVE
Protein, ur: NEGATIVE mg/dL
Specific Gravity, Urine: 1.031 — ABNORMAL HIGH (ref 1.005–1.030)
pH: 6 (ref 5.0–8.0)

## 2021-02-16 LAB — COMPREHENSIVE METABOLIC PANEL
ALT: 34 U/L (ref 0–44)
AST: 54 U/L — ABNORMAL HIGH (ref 15–41)
Albumin: 4.1 g/dL (ref 3.5–5.0)
Alkaline Phosphatase: 94 U/L (ref 38–126)
Anion gap: 13 (ref 5–15)
BUN: 15 mg/dL (ref 6–20)
CO2: 22 mmol/L (ref 22–32)
Calcium: 9.3 mg/dL (ref 8.9–10.3)
Chloride: 100 mmol/L (ref 98–111)
Creatinine, Ser: 0.86 mg/dL (ref 0.44–1.00)
GFR, Estimated: >60 mL/min (ref >60)
Glucose, Bld: 195 mg/dL — ABNORMAL HIGH (ref 70–99)
Potassium: 3.4 mmol/L — ABNORMAL LOW (ref 3.5–5.1)
Sodium: 135 mmol/L (ref 135–145)
Total Bilirubin: 0.3 mg/dL (ref 0.3–1.2)
Total Protein: 7.7 g/dL (ref 6.5–8.1)

## 2021-02-16 LAB — CBC
HCT: 46.1 % — ABNORMAL HIGH (ref 36.0–46.0)
Hemoglobin: 15 g/dL (ref 12.0–15.0)
MCH: 28 pg (ref 26.0–34.0)
MCHC: 32.5 g/dL (ref 30.0–36.0)
MCV: 86 fL (ref 80.0–100.0)
Platelets: 339 10*3/uL (ref 150–400)
RBC: 5.36 MIL/uL — ABNORMAL HIGH (ref 3.87–5.11)
RDW: 14.4 % (ref 11.5–15.5)
WBC: 14.3 10*3/uL — ABNORMAL HIGH (ref 4.0–10.5)
nRBC: 0 % (ref 0.0–0.2)

## 2021-02-16 LAB — LIPASE, BLOOD: Lipase: 53 U/L — ABNORMAL HIGH (ref 11–51)

## 2021-02-16 MED ORDER — SODIUM CHLORIDE 0.9 % IV BOLUS
1000.0000 mL | Freq: Once | INTRAVENOUS | Status: AC
  Administered 2021-02-16: 1000 mL via INTRAVENOUS

## 2021-02-16 MED ORDER — IOHEXOL 300 MG/ML  SOLN
100.0000 mL | Freq: Once | INTRAMUSCULAR | Status: AC | PRN
  Administered 2021-02-16: 100 mL via INTRAVENOUS

## 2021-02-16 MED ORDER — MORPHINE SULFATE (PF) 2 MG/ML IV SOLN
2.0000 mg | Freq: Once | INTRAVENOUS | Status: AC
  Administered 2021-02-16: 2 mg via INTRAVENOUS
  Filled 2021-02-16: qty 1

## 2021-02-16 MED ORDER — MORPHINE SULFATE (PF) 4 MG/ML IV SOLN
4.0000 mg | Freq: Once | INTRAVENOUS | Status: AC
  Administered 2021-02-16: 4 mg via INTRAVENOUS
  Filled 2021-02-16: qty 1

## 2021-02-16 MED ORDER — ONDANSETRON 4 MG PO TBDP
4.0000 mg | ORAL_TABLET | Freq: Once | ORAL | Status: AC | PRN
  Administered 2021-02-16: 4 mg via ORAL
  Filled 2021-02-16: qty 1

## 2021-02-16 MED ORDER — DICYCLOMINE HCL 20 MG PO TABS: 20.0000 mg | ORAL_TABLET | Freq: Two times a day (BID) | ORAL | 0 refills | Status: DC

## 2021-02-16 NOTE — ED Triage Notes (Signed)
Pt to the ED with RUQ abdominal pain with N/V/D that began last night.

## 2021-02-16 NOTE — ED Provider Notes (Signed)
[facility] Provider Note   CSN: [id number] Arrival date & time: 02/16/21  1649     History Chief Complaint  Patient presents with  . Abdominal Pain    Isabella Allyson SabalM Buchanan is a 40 y.o. female with history significant for diabetes, fibromyalgia, IBS, interstitial cystitis, seizures, chronic pain who presents for evaluation of abdominal pain.  States yesterday evening she developed right upper quadrant abdominal pain.  Radiates into epigastric region.  States initially had resolved.  Pain worse with food intake.  Associated nausea.  One episode of NBNB emesis.  She feels like she has to have a bowel movement and has diffuse abdominal cramping however subsequently does not have a bowel movement.  He rates her pain a 10/10.  She denies any flank pain.  No fever, chills, chest pain, shortness of breath, dysuria, hematuria, pelvic pain, vaginal discharge.  No lower abdominal pain.  She is s/p lap cholecystectomy.  Not take anything for symptoms.  She also admits to epigastric burning.  No prior history of reflux.  Denies additional aggravating or alleviating factors. No recent abx or travel. No sick contacts.  History obtained from patient and past medical records.  No interpreter used  HPI     Past Medical History:  Diagnosis Date  . Anxiety   . Chronic back pain   . Chronic flank pain   . Chronic pain   . Diabetes mellitus without complication (HCC)   . DOE (dyspnea on exertion)   . Fibromyalgia   . GERD (gastroesophageal reflux disease)   . Headache(784.0)    mygrains  . Hiatal hernia   . History of electroencephalogram 11/2013   normal EEG, "psychogenic nonepileptic spell" during EEG  . HPV (human papilloma virus) infection   . Hypertension   . IBS (irritable bowel syndrome)   . Interstitial cystitis   . Kidney stones   . Polycystic ovarian syndrome   . PONV (postoperative nausea and vomiting)   . Restless leg   . Seizures (HCC)   . Swelling     Patient  Active Problem List   Diagnosis Date Noted  . Sepsis secondary to UTI (HCC) 11/17/2019  . E coli bacteremia 11/16/2019  . Acute pyelonephritis 11/16/2019  . Asthmatic bronchitis , chronic (HCC) 05/12/2019  . Ureteral calculus, left 08/06/2017  . Morbid obesity (HCC) complicated by hyperlipid/hbp 08/02/2016  . Hypertriglyceridemia without hypercholesterolemia 08/02/2016  . Cigarette smoker 08/02/2016  . Essential hypertriglyceridemia 06/30/2015  . Combined fat and carbohydrate induced hyperlipemia 06/30/2015  . Nicotine addiction 06/28/2015  . Cyclic vomiting syndrome 06/28/2015  . Avitaminosis D 06/28/2015  . Essential hypertension 05/04/2015  . Degeneration of intervertebral disc of lumbar region 02/05/2015  . Anxiety, generalized 02/05/2015  . Community acquired pneumonia 07/17/2014  . CAP (community acquired pneumonia) 07/17/2014  . Sepsis (HCC) 07/17/2014  . Encephalopathy 07/17/2014  . Acute respiratory failure (HCC) 07/17/2014  . Seizure (HCC) 12/13/2013  . Seizures (HCC) 12/13/2013  . History of IBS 12/13/2013  . Fibromyalgia   . Chronic back pain   . Interstitial cystitis   . Polycystic ovarian syndrome     Past Surgical History:  Procedure Laterality Date  . CHOLECYSTECTOMY    . CYSTOSCOPY W/ URETERAL STENT PLACEMENT Left 08/06/2017   Procedure: CYSTOSCOPY WITH RETROGRADE PYELOGRAM/URETERAL STENT PLACEMENT;  Surgeon: Marcine Matarahlstedt, Stephen, MD;  Location: [facility];  Service: Urology;  Laterality: Left;  . CYSTOSCOPY/URETEROSCOPY/HOLMIUM LASER/STENT PLACEMENT Left 08/21/2017   Procedure: CYSTOSCOPY,LEFT URETERAL STENT REMOVAL, LEFT URETEROSCOPY, STONE BASKET EXTRACTION  AND LEFT URETERAL STENT PLACEMENT;  Surgeon: Marcine Matar, MD;  Location: [facility];  Service: Urology;  Laterality: Left;  . ESOPHAGOGASTRODUODENOSCOPY ENDOSCOPY    . TONSILLECTOMY    . TUBAL LIGATION    . TYMPANOPLASTY       OB History    Gravida  2   Para  2   Term      Preterm  2   AB       Living  2     SAB      IAB      Ectopic      Multiple      Live Births              Family History  Problem Relation Age of Onset  . Hypertension Father   . Diabetes Father   . Seizures Father   . Allergies Father   . Cancer Other   . Diabetes Other   . Lymphoma Maternal Grandmother   . Heart disease Maternal Grandmother   . Leukemia Maternal Grandfather     Social History   Tobacco Use  . Smoking status: Current Every Day Smoker    Packs/day: 1.00    Years: 23.00    Pack years: 23.00    Types: Cigarettes  . Smokeless tobacco: Never Used  Vaping Use  . Vaping Use: Never used  Substance Use Topics  . Alcohol use: Yes    Comment: rarely  . Drug use: No    Home Medications Prior to Admission medications   Medication Sig Start Date End Date Taking? Authorizing Provider  acetaminophen (TYLENOL) 325 MG tablet Take 2 tablets (650 mg total) by mouth every 6 (six) hours as needed for mild pain or fever. 11/19/19   Shon Hale, MD  atorvastatin (LIPITOR) 20 MG tablet Take 20 mg by mouth daily.    [provider]  clonazePAM (KLONOPIN) 0.5 MG tablet Take 0.25-0.5 mg by mouth 2 (two) times daily as needed. 08/08/17   [provider]  esomeprazole (NEXIUM) 40 MG capsule Take 1 capsule (40 mg total) by mouth daily with breakfast. 11/19/19   Mariea Clonts, Courage, MD  fenofibrate 160 MG tablet Take 160 mg by mouth daily.    [provider]  furosemide (LASIX) 20 MG tablet Take 20 mg by mouth as needed.    [provider]  gabapentin (NEURONTIN) 300 MG capsule Take 300 mg by mouth 2 (two) times a day. 05/02/19   [provider]  metFORMIN (GLUCOPHAGE-XR) 500 MG 24 hr tablet Take 1 tablet (500 mg total) by mouth daily with breakfast. 11/19/19   Emokpae, Courage, MD  mometasone-formoterol (DULERA) 200-5 MCG/ACT AERO Inhale 2 puffs into the lungs 2 (two) times daily. 07/02/19   Nyoka Cowden, MD  montelukast (SINGULAIR) 10 MG tablet  Take 10 mg by mouth at bedtime.    [provider]  ondansetron (ZOFRAN) 4 MG tablet Take 1 tablet (4 mg total) by mouth every 6 (six) hours as needed for nausea or vomiting. 10/12/17   Lavera Guise, MD  pramipexole (MIRAPEX) 1 MG tablet Take 1.5 mg by mouth at bedtime.     [provider]  valsartan (DIOVAN) 80 MG tablet Take 80 mg by mouth daily.    [provider]    Allergies    Omnicef [cefdinir], Amoxicillin, Celebrex [celecoxib], Codeine, Penicillins, and Sulfa antibiotics  Review of Systems   Review of Systems  Constitutional: Negative.   HENT: Negative.   Respiratory:  Negative.   Gastrointestinal: Positive for abdominal pain, diarrhea, nausea and vomiting. Negative for abdominal distention, anal bleeding, blood in stool, constipation and rectal pain.  Genitourinary: Negative.   Musculoskeletal: Negative.   Skin: Negative.   Neurological: Negative.   All other systems reviewed and are negative.   Physical Exam Updated Vital Signs BP 121/84   Pulse (!) 103   Temp 98.5 F (36.9 C) (Oral)   Resp 18   Ht 5\' 6"  (1.676 m)   Wt 89.4 kg   LMP 01/18/2021 (Approximate)   SpO2 100%   BMI 31.80 kg/m   Physical Exam Vitals and nursing note reviewed.  Constitutional:      General: She is not in acute distress.    Appearance: She is well-developed. She is not ill-appearing, toxic-appearing or diaphoretic.     Comments: Tearful in room  HENT:     Head: Normocephalic and atraumatic.     Mouth/Throat:     Pharynx: Oropharynx is clear.  Eyes:     Pupils: Pupils are equal, round, and reactive to light.  Cardiovascular:     Rate and Rhythm: Normal rate.  Pulmonary:     Effort: Pulmonary effort is normal. No respiratory distress.     Breath sounds: Normal breath sounds.  Abdominal:     General: Bowel sounds are normal. There is no distension.     Palpations: Abdomen is soft.     Tenderness: There is generalized abdominal tenderness and tenderness  in the right upper quadrant and epigastric area. There is no right CVA tenderness, left CVA tenderness, guarding or rebound. Negative signs include Murphy's sign and McBurney's sign.     Hernia: No hernia is present.  Musculoskeletal:        General: Normal range of motion.     Cervical back: Normal range of motion.  Skin:    General: Skin is warm and dry.     Capillary Refill: Capillary refill takes less than 2 seconds.  Neurological:     General: No focal deficit present.     Mental Status: She is alert and oriented to person, place, and time.     ED Results / Procedures / Treatments   Labs (all labs ordered are listed, but only abnormal results are displayed) Labs Reviewed  CBC - Abnormal; Notable for the following components:      Result Value   WBC 14.3 (*)    RBC 5.36 (*)    HCT 46.1 (*)    All other components within normal limits  LIPASE, BLOOD  COMPREHENSIVE METABOLIC PANEL  URINALYSIS, ROUTINE W REFLEX MICROSCOPIC  POC URINE PREG, ED    EKG None  Radiology No results found.  Procedures Procedures   Medications Ordered in ED Medications  ondansetron (ZOFRAN-ODT) disintegrating tablet 4 mg (4 mg Oral Given 02/16/21 1747)  sodium chloride 0.9 % bolus 1,000 mL (1,000 mLs Intravenous New Bag/Given 02/16/21 1810)  morphine 4 MG/ML injection 4 mg (4 mg Intravenous Given 02/16/21 1809)   ED Course  I have reviewed the triage vital signs and the nursing notes.  Pertinent labs & imaging results that were available during my care of the patient were reviewed by me and considered in my medical decision making (see chart for details).   40 year old here for evaluation of nausea, vomiting, diarrhea with associated abdominal pain.  She is afebrile, nonseptic appearing.  She is tearful in room.  Diffuse abdominal pain however worse to right upper quadrant, epigastric region. Pain does not  radiate to back, flank.  She is neurovascularly intact.  Low suspicion for AAA,  dissection.  She is s/p cholecystectomy.  No lower abdominal pain, vaginal bleeding, vaginal discharge to suggest torsion, PID.  Heart and lungs clear.   CBC with leukocytosis at 14.3  Care transferred to [facility], PA-C who will plan on follow up on remaining labs, imaging and determine disposition. Anticipate if no significant abnormality on labs and imaging to likely dc home with symptomatic management.    MDM Rules/Calculators/A&P                           Final Clinical Impression(s) / ED Diagnoses Final diagnoses:  Nausea vomiting and diarrhea    Rx / DC Orders ED Discharge Orders    None       Teoman Giraud A, PA-C 02/16/21 1836    Linwood Dibbles, MD 02/17/21 [phone]

## 2021-02-16 NOTE — Discharge Instructions (Addendum)
Your CT this evening was reassuring.  You have been prescribed medication that may help with your abdominal symptoms.  Call your primary care provider to arrange a follow-up appointment.  Return emergency department if you develop any worsening symptoms.

## 2021-02-16 NOTE — ED Provider Notes (Signed)
MSE was initiated and I personally evaluated the patient and placed orders (if any) at  5:32 PM on February 16, 2021.  Patient plaints of burning pain in her upper abdomen.  He has had trouble with nausea vomiting or diarrhea.  Patient does have history of IBS.  States she has had her gallbladder removed.  The patient appears stable so that the remainder of the MSE may be completed by another provider.  Labs and x-rays ordered.   Linwood Dibbles, MD 02/16/21 [id number]

## 2021-02-16 NOTE — ED Provider Notes (Signed)
   Patient signed out to me at end of shift by [facility], PA-C pending completion of work-up.  Patient is a 40 year old female here with right upper quadrant pain, nausea vomiting and diarrhea.  Symptoms began last evening.  She has history of prior cholecystectomy.  Labs show leukocytosis, lipase mildly elevated at 53.  Transaminases improved from January.  On my exam, patient has mild to moderate right upper quadrant tenderness, abdomen is soft no guarding or rebound tenderness.  She is overall well-appearing and nontoxic.    Labs Reviewed  LIPASE, BLOOD - Abnormal; Notable for the following components:      Result Value   Lipase 53 (*)    All other components within normal limits  COMPREHENSIVE METABOLIC PANEL - Abnormal; Notable for the following components:   Potassium 3.4 (*)    Glucose, Bld 195 (*)    AST 54 (*)    All other components within normal limits  CBC - Abnormal; Notable for the following components:   WBC 14.3 (*)    RBC 5.36 (*)    HCT 46.1 (*)    All other components within normal limits  URINALYSIS, ROUTINE W REFLEX MICROSCOPIC - Abnormal; Notable for the following components:   APPearance HAZY (*)    Specific Gravity, Urine 1.031 (*)    Glucose, UA >=500 (*)    Bacteria, UA RARE (*)    All other components within normal limits  URINE CULTURE  POC URINE PREG, ED     CT Abdomen Pelvis W Contrast  Result Date: 02/16/2021 CLINICAL DATA:  Right-sided abdominal pain for 2 days EXAM: CT ABDOMEN AND PELVIS WITH CONTRAST TECHNIQUE: Multidetector CT imaging of the abdomen and pelvis was performed using the standard protocol following bolus administration of intravenous contrast. CONTRAST:  OMNIPAQUE IOHEXOL 300 MG/ML  SOLN COMPARISON:  11/15/2019 FINDINGS: Lower chest: No acute abnormality. Hepatobiliary: Fatty infiltration of the liver is noted. Gallbladder has been surgically removed. Pancreas: Unremarkable. No pancreatic ductal dilatation or  surrounding inflammatory changes. Spleen: Normal in size without focal abnormality. Adrenals/Urinary Tract: Adrenal glands are within normal limits. Kidneys demonstrate a normal enhancement pattern bilaterally. Tiny nonobstructing left renal stones are seen. No obstructive changes are seen. The bladder is decompressed. Stomach/Bowel: Appendix is within normal limits. No obstructive or inflammatory changes of the colon or small bowel are seen. The stomach is unremarkable. Vascular/Lymphatic: Aortic atherosclerosis. No enlarged abdominal or pelvic lymph nodes. Reproductive: Uterus and bilateral adnexa are unremarkable. Other: No abdominal wall hernia or abnormality. No abdominopelvic ascites. Musculoskeletal: No acute or significant osseous findings. IMPRESSION: Fatty liver. Nonobstructing left renal calculi are seen. No other focal abnormality is noted. Electronically Signed   By: Alcide Clever M.D.   On: 02/16/2021 20:44    CT scan without acute findings.  Source of patient's right upper quadrant pain is unclear.  She does endorse some heavy lifting several days ago and questions whether this may be musculoskeletal. Discussed findings with patient.  I feel that she is appropriate for discharge home.  She agrees to close follow-up with PCP.  She has Zofran at home.  Will prescribe Bentyl for her symptoms.  Return precautions were discussed.    Pauline Aus, PA-C 02/16/21 2120    Linwood Dibbles, MD 02/17/21 1501

## 2021-02-17 LAB — POC URINE PREG, ED: Preg Test, Ur: NEGATIVE

## 2021-02-18 LAB — URINE CULTURE: Culture: <10000 — AB

## 2021-03-03 DIAGNOSIS — H9311 Tinnitus, right ear: Secondary | ICD-10-CM | POA: Insufficient documentation

## 2021-03-03 DIAGNOSIS — Z9889 Other specified postprocedural states: Secondary | ICD-10-CM | POA: Insufficient documentation

## 2021-05-26 DIAGNOSIS — Q165 Congenital malformation of inner ear: Secondary | ICD-10-CM | POA: Insufficient documentation

## 2021-06-17 DIAGNOSIS — E1142 Type 2 diabetes mellitus with diabetic polyneuropathy: Secondary | ICD-10-CM | POA: Insufficient documentation

## 2021-06-27 ENCOUNTER — Emergency Department (HOSPITAL_COMMUNITY): Payer: Medicaid Other

## 2021-06-27 ENCOUNTER — Other Ambulatory Visit: Payer: Self-pay

## 2021-06-27 ENCOUNTER — Encounter (HOSPITAL_COMMUNITY): Payer: Self-pay | Admitting: *Deleted

## 2021-06-27 ENCOUNTER — Emergency Department (HOSPITAL_COMMUNITY)
Admission: EM | Admit: 2021-06-27 | Discharge: 2021-06-27 | Disposition: A | Discharge status: Home / Self Care | Payer: Medicaid Other | Attending: Emergency Medicine | Admitting: Emergency Medicine

## 2021-06-27 DIAGNOSIS — R5383 Other fatigue: Secondary | ICD-10-CM | POA: Insufficient documentation

## 2021-06-27 DIAGNOSIS — F17210 Nicotine dependence, cigarettes, uncomplicated: Secondary | ICD-10-CM | POA: Insufficient documentation

## 2021-06-27 DIAGNOSIS — I10 Essential (primary) hypertension: Secondary | ICD-10-CM | POA: Diagnosis not present

## 2021-06-27 DIAGNOSIS — E119 Type 2 diabetes mellitus without complications: Secondary | ICD-10-CM | POA: Diagnosis not present

## 2021-06-27 DIAGNOSIS — Z79899 Other long term (current) drug therapy: Secondary | ICD-10-CM | POA: Insufficient documentation

## 2021-06-27 DIAGNOSIS — Z7984 Long term (current) use of oral hypoglycemic drugs: Secondary | ICD-10-CM | POA: Insufficient documentation

## 2021-06-27 DIAGNOSIS — Z20822 Contact with and (suspected) exposure to covid-19: Secondary | ICD-10-CM | POA: Diagnosis not present

## 2021-06-27 DIAGNOSIS — M791 Myalgia, unspecified site: Secondary | ICD-10-CM | POA: Diagnosis not present

## 2021-06-27 DIAGNOSIS — R059 Cough, unspecified: Secondary | ICD-10-CM | POA: Insufficient documentation

## 2021-06-27 DIAGNOSIS — Z8616 Personal history of COVID-19: Secondary | ICD-10-CM | POA: Insufficient documentation

## 2021-06-27 DIAGNOSIS — R5381 Other malaise: Secondary | ICD-10-CM | POA: Diagnosis not present

## 2021-06-27 LAB — CBC WITH DIFFERENTIAL/PLATELET
Abs Immature Granulocytes: 0.06 K/uL (ref 0.00–0.07)
Basophils Absolute: 0.1 K/uL (ref 0.0–0.1)
Basophils Relative: 1 %
Eosinophils Absolute: 0.6 K/uL — ABNORMAL HIGH (ref 0.0–0.5)
Eosinophils Relative: 5 %
HCT: 43.9 % (ref 36.0–46.0)
Hemoglobin: 13.7 g/dL (ref 12.0–15.0)
Immature Granulocytes: 1 %
Lymphocytes Relative: 25 %
Lymphs Abs: 3.1 K/uL (ref 0.7–4.0)
MCH: 27.6 pg (ref 26.0–34.0)
MCHC: 31.2 g/dL (ref 30.0–36.0)
MCV: 88.5 fL (ref 80.0–100.0)
Monocytes Absolute: 0.7 K/uL (ref 0.1–1.0)
Monocytes Relative: 6 %
Neutro Abs: 8 K/uL — ABNORMAL HIGH (ref 1.7–7.7)
Neutrophils Relative %: 62 %
Platelets: 292 K/uL (ref 150–400)
RBC: 4.96 MIL/uL (ref 3.87–5.11)
RDW: 14.3 % (ref 11.5–15.5)
WBC: 12.6 K/uL — ABNORMAL HIGH (ref 4.0–10.5)
nRBC: 0 % (ref 0.0–0.2)

## 2021-06-27 LAB — BASIC METABOLIC PANEL WITH GFR
Anion gap: 10 (ref 5–15)
BUN: 9 mg/dL (ref 6–20)
CO2: 26 mmol/L (ref 22–32)
Calcium: 8.8 mg/dL — ABNORMAL LOW (ref 8.9–10.3)
Chloride: 100 mmol/L (ref 98–111)
Creatinine, Ser: 0.74 mg/dL (ref 0.44–1.00)
GFR, Estimated: >60 mL/min
Glucose, Bld: 169 mg/dL — ABNORMAL HIGH (ref 70–99)
Potassium: 3.7 mmol/L (ref 3.5–5.1)
Sodium: 136 mmol/L (ref 135–145)

## 2021-06-27 NOTE — ED Provider Notes (Signed)
[facility] Provider Note   CSN: [id number] Arrival date & time: 06/27/21  1901     History Chief Complaint  Patient presents with   Generalized Body Aches    Isabella Buchanan is a 40 y.o. female.  HPI  Patient presents to the ED for evaluation of generalized malaise, fatigue and feeling tired.  Patient states she pulled a tick off of herself on August 3.  Patient had a video visit and was started on doxycycline on the third.  Patient was instructed by her doctor to come to the emergency room if she had worsening or persistent symptoms.  Patient states she continues to feel generalized malaise.  She has not noticed any increasing rash.  The tick bite itself does not look any worse than it did initially.  She denies any dysuria.  She is had a slight cough but she smokes and is no more than usual.  She does not have any known COVID exposures.  Past Medical History:  Diagnosis Date   Anxiety    Chronic back pain    Chronic flank pain    Chronic pain    Diabetes mellitus without complication (HCC)    DOE (dyspnea on exertion)    Fibromyalgia    GERD (gastroesophageal reflux disease)    Headache(784.0)    mygrains   Hiatal hernia    History of electroencephalogram 11/2013   normal EEG, "psychogenic nonepileptic spell" during EEG   HPV (human papilloma virus) infection    Hypertension    IBS (irritable bowel syndrome)    Interstitial cystitis    Kidney stones    Polycystic ovarian syndrome    PONV (postoperative nausea and vomiting)    Restless leg    Seizures (HCC)    Swelling     Patient Active Problem List   Diagnosis Date Noted   Sepsis secondary to UTI (HCC) 11/17/2019   E coli bacteremia 11/16/2019   Acute pyelonephritis 11/16/2019   Asthmatic bronchitis , chronic (HCC) 05/12/2019   Ureteral calculus, left 08/06/2017   Morbid obesity (HCC) complicated by hyperlipid/hbp 08/02/2016   Hypertriglyceridemia without hypercholesterolemia 08/02/2016    Cigarette smoker 08/02/2016   Essential hypertriglyceridemia 06/30/2015   Combined fat and carbohydrate induced hyperlipemia 06/30/2015   Nicotine addiction 06/28/2015   Cyclic vomiting syndrome 06/28/2015   Avitaminosis D 06/28/2015   Essential hypertension 05/04/2015   Degeneration of intervertebral disc of lumbar region 02/05/2015   Anxiety, generalized 02/05/2015   Community acquired pneumonia 07/17/2014   CAP (community acquired pneumonia) 07/17/2014   Sepsis (HCC) 07/17/2014   Encephalopathy 07/17/2014   Acute respiratory failure (HCC) 07/17/2014   Seizure (HCC) 12/13/2013   Seizures (HCC) 12/13/2013   History of IBS 12/13/2013   Fibromyalgia    Chronic back pain    Interstitial cystitis    Polycystic ovarian syndrome     Past Surgical History:  Procedure Laterality Date   CHOLECYSTECTOMY     CYSTOSCOPY W/ URETERAL STENT PLACEMENT Left 08/06/2017   Procedure: CYSTOSCOPY WITH RETROGRADE PYELOGRAM/URETERAL STENT PLACEMENT;  Surgeon: Marcine Matarahlstedt, Stephen, MD;  Location: [facility];  Service: Urology;  Laterality: Left;   CYSTOSCOPY/URETEROSCOPY/HOLMIUM LASER/STENT PLACEMENT Left 08/21/2017   Procedure: CYSTOSCOPY,LEFT URETERAL STENT REMOVAL, LEFT URETEROSCOPY, STONE BASKET EXTRACTION AND LEFT URETERAL STENT PLACEMENT;  Surgeon: Marcine Matarahlstedt, Stephen, MD;  Location: [facility];  Service: Urology;  Laterality: Left;   ESOPHAGOGASTRODUODENOSCOPY ENDOSCOPY     TONSILLECTOMY     TUBAL LIGATION     TYMPANOPLASTY  OB History     Gravida  2   Para  2   Term      Preterm  2   AB      Living  2      SAB      IAB      Ectopic      Multiple      Live Births              Family History  Problem Relation Age of Onset   Hypertension Father    Diabetes Father    Seizures Father    Allergies Father    Cancer Other    Diabetes Other    Lymphoma Maternal Grandmother    Heart disease Maternal Grandmother    Leukemia Maternal Grandfather     Social History    Tobacco Use   Smoking status: Every Day    Packs/day: 1.00    Years: 23.00    Pack years: 23.00    Types: Cigarettes   Smokeless tobacco: Never  Vaping Use   Vaping Use: Never used  Substance Use Topics   Alcohol use: Yes    Comment: rarely   Drug use: No    Home Medications Prior to Admission medications   Medication Sig Start Date End Date Taking? Authorizing Provider  acetaminophen (TYLENOL) 325 MG tablet Take 2 tablets (650 mg total) by mouth every 6 (six) hours as needed for mild pain or fever. 11/19/19   Shon Hale, MD  atorvastatin (LIPITOR) 20 MG tablet Take 20 mg by mouth daily.    [provider]  clonazePAM (KLONOPIN) 0.5 MG tablet Take 0.25-0.5 mg by mouth 2 (two) times daily as needed. 08/08/17   [provider]  dicyclomine (BENTYL) 20 MG tablet Take 1 tablet (20 mg total) by mouth 2 (two) times daily. 02/16/21   Triplett, Tammy, PA-C  esomeprazole (NEXIUM) 40 MG capsule Take 1 capsule (40 mg total) by mouth daily with breakfast. 11/19/19   Mariea Clonts, Courage, MD  fenofibrate 160 MG tablet Take 160 mg by mouth daily.    [provider]  furosemide (LASIX) 20 MG tablet Take 20 mg by mouth as needed.    [provider]  gabapentin (NEURONTIN) 300 MG capsule Take 300 mg by mouth 2 (two) times a day. 05/02/19   [provider]  metFORMIN (GLUCOPHAGE-XR) 500 MG 24 hr tablet Take 1 tablet (500 mg total) by mouth daily with breakfast. 11/19/19   Emokpae, Courage, MD  mometasone-formoterol (DULERA) 200-5 MCG/ACT AERO Inhale 2 puffs into the lungs 2 (two) times daily. 07/02/19   Nyoka Cowden, MD  montelukast (SINGULAIR) 10 MG tablet Take 10 mg by mouth at bedtime.    [provider]  ondansetron (ZOFRAN) 4 MG tablet Take 1 tablet (4 mg total) by mouth every 6 (six) hours as needed for nausea or vomiting. 10/12/17   Lavera Guise, MD  pramipexole (MIRAPEX) 1 MG tablet Take 1.5 mg by mouth at bedtime.     [provider]  valsartan (DIOVAN) 80 MG tablet Take 80 mg by mouth daily.    [provider]    Allergies    Omnicef [cefdinir], Amoxicillin, Celebrex [celecoxib], Codeine, Penicillins, and Sulfa antibiotics  Review of Systems   Review of Systems  All other systems reviewed and are negative.  Physical Exam Updated Vital Signs BP 129/81 (BP Location: Right Arm)   Pulse 89   Temp 98.6 F (37 C) (Oral)  Resp 16   Ht 1.651 m (5\' 5" )   Wt 85.7 kg   SpO2 98%   BMI 31.45 kg/m   Physical Exam Vitals and nursing note reviewed.  Constitutional:      General: She is not in acute distress.    Appearance: She is well-developed.  HENT:     Head: Normocephalic and atraumatic.     Right Ear: External ear normal.     Left Ear: External ear normal.  Eyes:     General: No scleral icterus.       Right eye: No discharge.        Left eye: No discharge.     Conjunctiva/sclera: Conjunctivae normal.  Neck:     Trachea: No tracheal deviation.  Cardiovascular:     Rate and Rhythm: Normal rate and regular rhythm.  Pulmonary:     Effort: Pulmonary effort is normal. No respiratory distress.     Breath sounds: Normal breath sounds. No stridor. No wheezing or rales.  Abdominal:     General: Bowel sounds are normal. There is no distension.     Palpations: Abdomen is soft.     Tenderness: There is no abdominal tenderness. There is no guarding or rebound.  Musculoskeletal:        General: No tenderness or deformity.     Cervical back: Neck supple.  Skin:    General: Skin is warm and dry.     Findings: No rash.  Neurological:     General: No focal deficit present.     Mental Status: She is alert.     Cranial Nerves: No cranial nerve deficit (no facial droop, extraocular movements intact, no slurred speech).     Sensory: No sensory deficit.     Motor: No abnormal muscle tone or seizure activity.     Coordination: Coordination normal.  Psychiatric:        Mood and Affect: Mood  normal.    ED Results / Procedures / Treatments   Labs (all labs ordered are listed, but only abnormal results are displayed) Labs Reviewed  CBC WITH DIFFERENTIAL/PLATELET - Abnormal; Notable for the following components:      Result Value   WBC 12.6 (*)    Neutro Abs 8.0 (*)    Eosinophils Absolute 0.6 (*)    All other components within normal limits  BASIC METABOLIC PANEL - Abnormal; Notable for the following components:   Glucose, Bld 169 (*)    Calcium 8.8 (*)    All other components within normal limits  SARS CORONAVIRUS 2 (TAT 6-24 HRS)    EKG None  Radiology DG Chest Portable 1 View  Result Date: 06/27/2021 CLINICAL DATA:  Cough, fatigue, fever EXAM: PORTABLE CHEST 1 VIEW COMPARISON:  None. FINDINGS: The heart size and mediastinal contours are within normal limits. Both lungs are clear. The visualized skeletal structures are unremarkable. IMPRESSION: No active disease. Electronically Signed   By: 08/27/2021 M.D.   On: 06/27/2021 20:01    Procedures Procedures   Medications Ordered in ED Medications - No data to display  ED Course  I have reviewed the triage vital signs and the nursing notes.  Pertinent labs & imaging results that were available during my care of the patient were reviewed by me and considered in my medical decision making (see chart for details).    MDM Rules/Calculators/A&P  Patient with recent tick bite illness.  She was empirically started on doxycycline.  Patient still has been feeling weak fatigued and tired.  No evidence of anemia.  No signs of dehydration or electrolyte abnormalities.  Patient's vital signs are normal.  No signs of sepsis.  Possible her symptoms could be related to a tickborne illness but she appears stable to continue outpatient doxycycline.  COVID test was added on and this will be available by tomorrow. Final Clinical Impression(s) / ED Diagnoses Final diagnoses:  Myalgia    Rx / DC  Orders ED Discharge Orders     None        Linwood Dibbles, MD 06/27/21 2133

## 2021-06-27 NOTE — Discharge Instructions (Addendum)
Continue your current medications.  Your COVID test should be available in MyChart.

## 2021-06-27 NOTE — ED Triage Notes (Signed)
Pt states she pulled a tick off her 8/3 and was placed on doxycycline but pt states she doesn't feel well; she states she feels weak and tired

## 2021-06-28 LAB — SARS CORONAVIRUS 2 (TAT 6-24 HRS): SARS Coronavirus 2: NEGATIVE

## 2021-08-09 DIAGNOSIS — Z9889 Other specified postprocedural states: Secondary | ICD-10-CM | POA: Insufficient documentation

## 2021-10-14 ENCOUNTER — Encounter (HOSPITAL_COMMUNITY): Payer: Self-pay

## 2021-10-14 ENCOUNTER — Emergency Department (HOSPITAL_COMMUNITY): Payer: Medicaid Other

## 2021-10-14 ENCOUNTER — Other Ambulatory Visit: Payer: Self-pay

## 2021-10-14 ENCOUNTER — Emergency Department (HOSPITAL_COMMUNITY)
Admission: EM | Admit: 2021-10-14 | Discharge: 2021-10-14 | Disposition: A | Discharge status: Home / Self Care | Payer: Medicaid Other | Attending: Emergency Medicine | Admitting: Emergency Medicine

## 2021-10-14 DIAGNOSIS — Z7984 Long term (current) use of oral hypoglycemic drugs: Secondary | ICD-10-CM | POA: Diagnosis not present

## 2021-10-14 DIAGNOSIS — R051 Acute cough: Secondary | ICD-10-CM | POA: Insufficient documentation

## 2021-10-14 DIAGNOSIS — M79602 Pain in left arm: Secondary | ICD-10-CM | POA: Diagnosis not present

## 2021-10-14 DIAGNOSIS — Z20822 Contact with and (suspected) exposure to covid-19: Secondary | ICD-10-CM | POA: Diagnosis not present

## 2021-10-14 DIAGNOSIS — R0789 Other chest pain: Secondary | ICD-10-CM | POA: Diagnosis present

## 2021-10-14 DIAGNOSIS — M542 Cervicalgia: Secondary | ICD-10-CM | POA: Diagnosis not present

## 2021-10-14 DIAGNOSIS — Z79899 Other long term (current) drug therapy: Secondary | ICD-10-CM | POA: Insufficient documentation

## 2021-10-14 DIAGNOSIS — F17210 Nicotine dependence, cigarettes, uncomplicated: Secondary | ICD-10-CM | POA: Insufficient documentation

## 2021-10-14 DIAGNOSIS — E1142 Type 2 diabetes mellitus with diabetic polyneuropathy: Secondary | ICD-10-CM | POA: Diagnosis not present

## 2021-10-14 DIAGNOSIS — R110 Nausea: Secondary | ICD-10-CM | POA: Diagnosis not present

## 2021-10-14 DIAGNOSIS — I10 Essential (primary) hypertension: Secondary | ICD-10-CM | POA: Insufficient documentation

## 2021-10-14 LAB — BASIC METABOLIC PANEL
Anion gap: 12 (ref 5–15)
BUN: 10 mg/dL (ref 6–20)
CO2: 27 mmol/L (ref 22–32)
Calcium: 9.2 mg/dL (ref 8.9–10.3)
Chloride: 99 mmol/L (ref 98–111)
Creatinine, Ser: 0.81 mg/dL (ref 0.44–1.00)
GFR, Estimated: >60 mL/min (ref >60)
Glucose, Bld: 229 mg/dL — ABNORMAL HIGH (ref 70–99)
Potassium: 3.4 mmol/L — ABNORMAL LOW (ref 3.5–5.1)
Sodium: 138 mmol/L (ref 135–145)

## 2021-10-14 LAB — CBC WITH DIFFERENTIAL/PLATELET
Abs Immature Granulocytes: 0.04 10*3/uL (ref 0.00–0.07)
Basophils Absolute: 0.1 10*3/uL (ref 0.0–0.1)
Basophils Relative: 1 %
Eosinophils Absolute: 0.4 10*3/uL (ref 0.0–0.5)
Eosinophils Relative: 3 %
HCT: 42.1 % (ref 36.0–46.0)
Hemoglobin: 13.4 g/dL (ref 12.0–15.0)
Immature Granulocytes: 0 %
Lymphocytes Relative: 19 %
Lymphs Abs: 2.6 10*3/uL (ref 0.7–4.0)
MCH: 27.9 pg (ref 26.0–34.0)
MCHC: 31.8 g/dL (ref 30.0–36.0)
MCV: 87.7 fL (ref 80.0–100.0)
Monocytes Absolute: 0.9 10*3/uL (ref 0.1–1.0)
Monocytes Relative: 6 %
Neutro Abs: 9.4 10*3/uL — ABNORMAL HIGH (ref 1.7–7.7)
Neutrophils Relative %: 71 %
Platelets: 284 10*3/uL (ref 150–400)
RBC: 4.8 MIL/uL (ref 3.87–5.11)
RDW: 14.1 % (ref 11.5–15.5)
WBC: 13.4 10*3/uL — ABNORMAL HIGH (ref 4.0–10.5)
nRBC: 0 % (ref 0.0–0.2)

## 2021-10-14 LAB — RESP PANEL BY RT-PCR (FLU A&B, COVID) ARPGX2
Influenza A by PCR: NEGATIVE
Influenza B by PCR: NEGATIVE
SARS Coronavirus 2 by RT PCR: NEGATIVE

## 2021-10-14 LAB — TROPONIN I (HIGH SENSITIVITY): Troponin I (High Sensitivity): 3 ng/L (ref <18)

## 2021-10-14 LAB — D-DIMER, QUANTITATIVE: D-Dimer, Quant: 1.01 ug/mL-FEU — ABNORMAL HIGH (ref 0.00–0.50)

## 2021-10-14 MED ORDER — IOHEXOL 350 MG/ML SOLN
100.0000 mL | Freq: Once | INTRAVENOUS | Status: AC | PRN
  Administered 2021-10-14: 100 mL via INTRAVENOUS

## 2021-10-14 MED ORDER — ALBUTEROL SULFATE HFA 108 (90 BASE) MCG/ACT IN AERS
1.0000 | INHALATION_SPRAY | Freq: Once | RESPIRATORY_TRACT | Status: AC
Start: 2021-10-14 — End: 2021-10-14
  Administered 2021-10-14: 2 via RESPIRATORY_TRACT
  Filled 2021-10-14: qty 6.7

## 2021-10-14 MED ORDER — ONDANSETRON HCL 4 MG/2ML IJ SOLN
4.0000 mg | Freq: Once | INTRAMUSCULAR | Status: AC
  Administered 2021-10-14: 4 mg via INTRAVENOUS
  Filled 2021-10-14: qty 2

## 2021-10-14 NOTE — Discharge Instructions (Addendum)
As discussed, take your prednisone as directed.  Please monitor your blood sugars closely while taking the prednisone.  You may need to stop the prednisone if your blood sugars become elevated.  Use the albuterol inhaler 1 to 2 puffs every 4-6 hours as needed.  Follow-up with your primary care provider next week for recheck.  Return to the emergency department for any new or worsening symptoms.

## 2021-10-14 NOTE — ED Notes (Signed)
Patient transported to CT 

## 2021-10-14 NOTE — ED Provider Notes (Signed)
[facility] EMERGENCY DEPARTMENT Provider Note   CSN: [id number] Arrival date & time: 10/14/21  1456     History Chief Complaint  Patient presents with   Arm Pain   Chest Pain    Isabella Buchanan is a 40 y.o. female.   Arm Pain Associated symptoms include chest pain. Pertinent negatives include no abdominal pain and no headaches.  Chest Pain Associated symptoms: cough   Associated symptoms: no abdominal pain, no dizziness, no fever, no headache, no nausea, no numbness, no vomiting and no weakness       Isabella Buchanan is a 40 y.o. female with past medical history of diabetes, fibromyalgia, and seizures who presents to the Emergency Department complaining of left-sided chest, neck, and arm pain that started last evening around 8:52 PM.  She describes the pain as aching and associated with feeling like she was "drunk."  Symptoms lasted approximately 2 hours then spontaneously resolved.  She woke this morning felt normal continued with her normal activities, pain returned this evening, now mostly into her left arm down to the level of her hand, chest pain has improved.  She denies dizziness, headache, visual change, shortness of breath, jaw pain, diaphoresis.  She states that her daughter was recently diagnosed with the flu.  She does endorse some coughing and body aches as well.  No fever.  Symptoms have been associated with nausea.  No vomiting or diarrhea.  No abdominal pain.  Has recently completed several courses of antibiotics including clindamycin, doxycycline and 5 days of Levaquin.  All without relief of symptoms.   Past Medical History:  Diagnosis Date   Anxiety    Chronic back pain    Chronic flank pain    Chronic pain    Diabetes mellitus without complication (HCC)    DOE (dyspnea on exertion)    Fibromyalgia    GERD (gastroesophageal reflux disease)    Headache(784.0)    mygrains   Hiatal hernia    History of electroencephalogram 11/2013   normal EEG,  "psychogenic nonepileptic spell" during EEG   HPV (human papilloma virus) infection    Hypertension    IBS (irritable bowel syndrome)    Interstitial cystitis    Kidney stones    Polycystic ovarian syndrome    PONV (postoperative nausea and vomiting)    Restless leg    Seizures (HCC)    Swelling     Patient Active Problem List   Diagnosis Date Noted   History of tympanomastoidectomy 08/09/2021   Diabetic polyneuropathy associated with type 2 diabetes mellitus (HCC) 06/17/2021   Tegmen defect of base of skull 05/26/2021   History of tympanoplasty of right ear 03/03/2021   Tinnitus, subjective, right 03/03/2021   Tympanic membrane perforation, marginal, right 10/27/2020   Type 2 diabetes mellitus without complication, without long-term current use of insulin (HCC) 05/10/2020   IC (interstitial cystitis) 01/31/2020   PCOS (polycystic ovarian syndrome) 01/31/2020   E coli bacteremia 11/16/2019   Pyelonephritis 11/16/2019   Chronic mycotic otitis externa 05/20/2019   Chronic pansinusitis 05/20/2019   Otorrhea of right ear 05/20/2019   Asthmatic bronchitis , chronic (HCC) 05/12/2019   Peripheral edema 01/22/2019   Gastroesophageal reflux disease without esophagitis 05/22/2018   Nephrolithiasis 08/08/2017   Ureteral calculus, left 08/06/2017   Morbid obesity (HCC) complicated by hyperlipid/hbp 08/02/2016   Hypertriglyceridemia without hypercholesterolemia 08/02/2016   Cigarette smoker 08/02/2016   Essential hypertriglyceridemia 06/30/2015   Mixed hyperlipidemia 06/30/2015   Nicotine addiction 06/28/2015   Cyclic  vomiting syndrome 06/28/2015   Vitamin D deficiency 06/28/2015   Essential hypertension 05/04/2015   Degeneration of intervertebral disc of lumbar region 02/05/2015   Anxiety, generalized 02/05/2015   Community acquired pneumonia 07/17/2014   CAP (community acquired pneumonia) 07/17/2014   Sepsis (HCC) 07/17/2014   Encephalopathy 07/17/2014   Acute respiratory  failure (HCC) 07/17/2014   Sepsis secondary to UTI (HCC) 07/17/2014   Seizure (HCC) 12/13/2013   Seizures (HCC) 12/13/2013   History of IBS 12/13/2013   Personal history of other diseases of the digestive system 12/13/2013   Fibromyalgia    Chronic back pain     Past Surgical History:  Procedure Laterality Date   CHOLECYSTECTOMY     CYSTOSCOPY W/ URETERAL STENT PLACEMENT Left 08/06/2017   Procedure: CYSTOSCOPY WITH RETROGRADE PYELOGRAM/URETERAL STENT PLACEMENT;  Surgeon: Marcine Matar, MD;  Location: [facility];  Service: Urology;  Laterality: Left;   CYSTOSCOPY/URETEROSCOPY/HOLMIUM LASER/STENT PLACEMENT Left 08/21/2017   Procedure: CYSTOSCOPY,LEFT URETERAL STENT REMOVAL, LEFT URETEROSCOPY, STONE BASKET EXTRACTION AND LEFT URETERAL STENT PLACEMENT;  Surgeon: Marcine Matar, MD;  Location: [facility];  Service: Urology;  Laterality: Left;   ESOPHAGOGASTRODUODENOSCOPY ENDOSCOPY     TONSILLECTOMY     TUBAL LIGATION     TYMPANOPLASTY       OB History     Gravida  2   Para  2   Term      Preterm  2   AB      Living  2      SAB      IAB      Ectopic      Multiple      Live Births              Family History  Problem Relation Age of Onset   Hypertension Father    Diabetes Father    Seizures Father    Allergies Father    Cancer Other    Diabetes Other    Lymphoma Maternal Grandmother    Heart disease Maternal Grandmother    Leukemia Maternal Grandfather     Social History   Tobacco Use   Smoking status: Every Day    Packs/day: 1.00    Years: 23.00    Pack years: 23.00    Types: Cigarettes   Smokeless tobacco: Never  Vaping Use   Vaping Use: Never used  Substance Use Topics   Alcohol use: Yes    Comment: rarely   Drug use: No    Home Medications Prior to Admission medications   Medication Sig Start Date End Date Taking? Authorizing Provider  acetaminophen (TYLENOL) 325 MG tablet Take 2 tablets (650 mg total) by mouth every 6 (six) hours as  needed for mild pain or fever. 11/19/19   Shon Hale, MD  atorvastatin (LIPITOR) 20 MG tablet Take 20 mg by mouth daily.    [provider]  clonazePAM (KLONOPIN) 0.5 MG tablet Take 0.25-0.5 mg by mouth 2 (two) times daily as needed. 08/08/17   [provider]  dicyclomine (BENTYL) 20 MG tablet Take 1 tablet (20 mg total) by mouth 2 (two) times daily. 02/16/21   Haidyn Kilburg, PA-C  esomeprazole (NEXIUM) 40 MG capsule Take 1 capsule (40 mg total) by mouth daily with breakfast. 11/19/19   Mariea Clonts, Courage, MD  fenofibrate 160 MG tablet Take 160 mg by mouth daily.    [provider]  furosemide (LASIX) 20 MG tablet Take 20 mg by mouth as needed.    [provider]  gabapentin (NEURONTIN) 300 MG capsule Take 300 mg by mouth 2 (two) times a day. 05/02/19   [provider]  metFORMIN (GLUCOPHAGE-XR) 500 MG 24 hr tablet Take 1 tablet (500 mg total) by mouth daily with breakfast. 11/19/19   Emokpae, Courage, MD  mometasone-formoterol (DULERA) 200-5 MCG/ACT AERO Inhale 2 puffs into the lungs 2 (two) times daily. 07/02/19   Nyoka Cowden, MD  montelukast (SINGULAIR) 10 MG tablet Take 10 mg by mouth at bedtime.    [provider]  ondansetron (ZOFRAN) 4 MG tablet Take 1 tablet (4 mg total) by mouth every 6 (six) hours as needed for nausea or vomiting. 10/12/17   Lavera Guise, MD  pramipexole (MIRAPEX) 1 MG tablet Take 1.5 mg by mouth at bedtime.     [provider]  valsartan (DIOVAN) 80 MG tablet Take 80 mg by mouth daily.    [provider]    Allergies    Omnicef [cefdinir], Amoxicillin, Celebrex [celecoxib], Codeine, Penicillins, and Sulfa antibiotics  Review of Systems   Review of Systems  Constitutional:  Negative for fever.  HENT:  Positive for congestion.   Respiratory:  Positive for cough.   Cardiovascular:  Positive for chest pain.  Gastrointestinal:  Negative for abdominal pain, nausea and vomiting.   Musculoskeletal:  Positive for myalgias (Left arm pain) and neck pain. Negative for arthralgias.  Skin:  Negative for rash and wound.  Neurological:  Negative for dizziness, syncope, weakness, numbness and headaches.  Psychiatric/Behavioral:  Negative for confusion.   All other systems reviewed and are negative.  Physical Exam Updated Vital Signs BP (!) 100/58   Pulse 87   Temp 98.5 F (36.9 C) (Oral)   Resp 19   Ht 5\' 5"  (1.651 m)   Wt 86.2 kg   LMP 10/10/2021   SpO2 91%   BMI 31.62 kg/m   Physical Exam Vitals and nursing note reviewed.  Constitutional:      General: She is not in acute distress.    Appearance: She is well-developed. She is not toxic-appearing.  HENT:     Nose: No rhinorrhea.     Mouth/Throat:     Mouth: Mucous membranes are moist.     Pharynx: No oropharyngeal exudate or posterior oropharyngeal erythema.  Cardiovascular:     Rate and Rhythm: Normal rate and regular rhythm.     Pulses: Normal pulses.  Pulmonary:     Effort: Pulmonary effort is normal. No respiratory distress.     Breath sounds: Normal breath sounds. No wheezing.  Abdominal:     Palpations: Abdomen is soft.     Tenderness: There is no abdominal tenderness.  Musculoskeletal:        General: Normal range of motion.     Right lower leg: No edema.     Left lower leg: No edema.  Skin:    General: Skin is warm.     Capillary Refill: Capillary refill takes less than 2 seconds.  Neurological:     General: No focal deficit present.     Mental Status: She is alert.     Sensory: No sensory deficit.     Motor: No weakness.    ED Results / Procedures / Treatments   Labs (all labs ordered are listed, but only abnormal results are displayed) Labs Reviewed  BASIC METABOLIC PANEL - Abnormal; Notable for the following components:      Result Value   Potassium 3.4 (*)    Glucose, Bld 229 (*)  All other components within normal limits  CBC WITH DIFFERENTIAL/PLATELET - Abnormal; Notable  for the following components:   WBC 13.4 (*)    Neutro Abs 9.4 (*)    All other components within normal limits  D-DIMER, QUANTITATIVE - Abnormal; Notable for the following components:   D-Dimer, Quant 1.01 (*)    All other components within normal limits  RESP PANEL BY RT-PCR (FLU A&B, COVID) ARPGX2  TROPONIN I (HIGH SENSITIVITY)    EKG EKG Interpretation  Date/Time:  Friday October 14 2021 15:17:57 EST Ventricular Rate:  97 PR Interval:  147 QRS Duration: 85 QT Interval:  361 QTC Calculation: 459 R Axis:   44 Text Interpretation: Sinus rhythm Borderline T abnormalities, inferior leads since last tracing no significant change Confirmed by Mancel Bale [phone]) on 10/14/2021 3:29:38 PM  Radiology CT Angio Chest PE W and/or Wo Contrast  Result Date: 10/14/2021 CLINICAL DATA:  Midsternal chest pain radiating to the left shoulder with elevated D-dimer levels for 1 day. Pulmonary embolism suspected, low/intermediate probability. No recent injury or prior relevant surgery. EXAM: CT ANGIOGRAPHY CHEST WITH CONTRAST TECHNIQUE: Multidetector CT imaging of the chest was performed using the standard protocol during bolus administration of intravenous contrast. Multiplanar CT image reconstructions and MIPs were obtained to evaluate the vascular anatomy. CONTRAST:  OMNIPAQUE IOHEXOL 350 MG/ML SOLN COMPARISON:  Radiographs same date.  Abdominopelvic CT 02/16/2021. FINDINGS: Cardiovascular: The pulmonary arteries are well opacified with contrast to the level of the subsegmental branches. There is no evidence of acute pulmonary embolism. Mild aortic atherosclerosis. No acute systemic arterial abnormalities are identified. The heart size is normal. There is no pericardial effusion. Mediastinum/Nodes: There are no enlarged mediastinal, hilar or axillary lymph nodes.Stable fluid density lesion adjacent to the distal esophagus, measuring 1.9 x 1.2 cm on image 63/4. This is unchanged from at least  09/20/2017 and is probably a small bronchogenic cyst or other benign finding. The thyroid gland, trachea and esophagus demonstrate no significant findings. Lungs/Pleura: There is no pleural effusion. Patchy ground-glass opacities within the lungs which could reflect mild edema. No focal airspace disease or suspicious pulmonary nodule. No pneumothorax. Upper abdomen: Hepatic low density consistent with steatosis. No focal abnormality identified. Musculoskeletal/Chest wall: There is no chest wall mass or suspicious osseous finding. Review of the MIP images confirms the above findings. IMPRESSION: 1. No evidence of acute pulmonary embolism or other acute vascular findings in the chest. 2. Faint ground-glass opacities in both lungs could reflect mild edema. No focal airspace disease, pleural effusion or pneumothorax. 3. Mild Aortic Atherosclerosis (ICD10-I70.0). 4. Hepatic steatosis. Electronically Signed   By: Carey Bullocks M.D.   On: 10/14/2021 20:20   DG Chest Port 1 View  Result Date: 10/14/2021 CLINICAL DATA:  Chest pain EXAM: PORTABLE CHEST 1 VIEW COMPARISON:  06/27/2021 FINDINGS: The heart size and mediastinal contours are within normal limits. Both lungs are clear. The visualized skeletal structures are unremarkable. IMPRESSION: No active disease. Electronically Signed   By: Jasmine Pang M.D.   On: 10/14/2021 18:32    Procedures Procedures   Medications Ordered in ED Medications - No data to display  ED Course  I have reviewed the triage vital signs and the nursing notes.  Pertinent labs & imaging results that were available during my care of the patient were reviewed by me and considered in my medical decision making (see chart for details).    MDM Rules/Calculators/A&P  Patient here with left-sided chest pain and left arm pain, symptoms episodic beginning last night and again this afternoon. Cough with recent flu  On exam, well appearing.  No focal neuro  deficits.  Vitals reassuring. No tachycardia or hypoxia.  Lungs clear  EKG w/o ischemic change.  Trop reassuring.  D dimer elevated.  Remaining labs unremarkable.   Will proceed with CTA chest  CTA chest w/o evidence of PE.  Faint ground glass opacities felt to be related to edema.  Pt reports multiple recent antibiotics.  Doubt infectious process.    Pt requesting d/c home.  Appears appropriate for d/c home. Agrees to close out patient follow up.  Final Clinical Impression(s) / ED Diagnoses Final diagnoses:  Atypical chest pain  Acute cough    Rx / DC Orders ED Discharge Orders     None        Rosey Bath 10/17/21 2053    Vanetta Mulders, MD 10/20/21 0002

## 2021-10-14 NOTE — ED Triage Notes (Signed)
Mid sternal chest pain and left arm pain that started yesterday, pt reports weakness and nausea with vomiting. Denies fever, + cough, runny nose.

## 2022-01-08 ENCOUNTER — Emergency Department (HOSPITAL_COMMUNITY)
Admission: EM | Admit: 2022-01-08 | Discharge: 2022-01-08 | Disposition: A | Discharge status: Home / Self Care | Payer: Medicaid Other | Attending: Emergency Medicine | Admitting: Emergency Medicine

## 2022-01-08 ENCOUNTER — Other Ambulatory Visit: Payer: Self-pay

## 2022-01-08 ENCOUNTER — Encounter (HOSPITAL_COMMUNITY): Payer: Self-pay

## 2022-01-08 DIAGNOSIS — J069 Acute upper respiratory infection, unspecified: Secondary | ICD-10-CM

## 2022-01-08 DIAGNOSIS — Z7984 Long term (current) use of oral hypoglycemic drugs: Secondary | ICD-10-CM | POA: Insufficient documentation

## 2022-01-08 DIAGNOSIS — R0981 Nasal congestion: Secondary | ICD-10-CM | POA: Diagnosis present

## 2022-01-08 MED ORDER — CLARITIN-D 24 HOUR 10-240 MG PO TB24: 1.0000 | ORAL_TABLET | Freq: Every day | ORAL | 0 refills | Status: AC

## 2022-01-08 MED ORDER — BENZONATATE 200 MG PO CAPS: 200.0000 mg | ORAL_CAPSULE | Freq: Three times a day (TID) | ORAL | 0 refills | Status: DC | PRN

## 2022-01-08 MED ORDER — ALBUTEROL SULFATE HFA 108 (90 BASE) MCG/ACT IN AERS: 2.0000 | INHALATION_SPRAY | RESPIRATORY_TRACT | 0 refills | Status: AC | PRN

## 2022-01-08 NOTE — ED Provider Notes (Signed)
[facility] EMERGENCY DEPARTMENT Provider Note   CSN: [id number] Arrival date & time: 01/08/22  0458     History  Chief Complaint  Patient presents with   Nasal Congestion    Isabella Buchanan is a 41 y.o. female.  Patient presents to the emergency department for evaluation of nasal congestion, sore throat, cough.  Symptoms ongoing for 3 days.  Patient reports that she could not go to work today because of her symptoms.      Home Medications Prior to Admission medications   Medication Sig Start Date End Date Taking? Authorizing Provider  albuterol (VENTOLIN HFA) 108 (90 Base) MCG/ACT inhaler Inhale 2 puffs into the lungs every 4 (four) hours as needed for wheezing or shortness of breath. 01/08/22  Yes Huma Imhoff, Canary Brim, MD  benzonatate (TESSALON) 200 MG capsule Take 1 capsule (200 mg total) by mouth 3 (three) times daily as needed for cough. 01/08/22  Yes Raynell Upton, Canary Brim, MD  loratadine-pseudoephedrine (CLARITIN-D 24 HOUR) 10-240 MG 24 hr tablet Take 1 tablet by mouth daily. 01/08/22  Yes Page Lancon, Canary Brim, MD  acetaminophen (TYLENOL) 325 MG tablet Take 2 tablets (650 mg total) by mouth every 6 (six) hours as needed for mild pain or fever. 11/19/19   Shon Hale, MD  atorvastatin (LIPITOR) 20 MG tablet Take 20 mg by mouth daily. Patient not taking: Reported on 10/14/2021    [provider]  atorvastatin (LIPITOR) 40 MG tablet Take by mouth. 07/15/20   [provider]  ciprofloxacin-dexamethasone (CIPRODEX) OTIC suspension SMARTSIG:4 Drop(s) Right Ear Twice Daily Patient not taking: Reported on 10/14/2021 08/02/21   [provider]  citalopram (CELEXA) 20 MG tablet Take by mouth. Patient not taking: Reported on 10/14/2021 05/26/19   [provider]  clonazePAM (KLONOPIN) 0.5 MG tablet Take 0.25-0.5 mg by mouth 2 (two) times daily as needed. Patient not taking: Reported on 10/14/2021 08/08/17   [provider]  clonazePAM  (KLONOPIN) 1 MG tablet Take 1 mg by mouth 2 (two) times daily as needed for anxiety. 06/24/21   [provider]  dapagliflozin propanediol (FARXIGA) 10 MG TABS tablet Take 1 tablet by mouth daily. 06/19/21   [provider]  dicyclomine (BENTYL) 20 MG tablet Take 1 tablet (20 mg total) by mouth 2 (two) times daily. Patient not taking: Reported on 10/14/2021 02/16/21   Pauline Aus, PA-C  esomeprazole (NEXIUM) 40 MG capsule Take 1 capsule (40 mg total) by mouth daily with breakfast. 11/19/19   Shon Hale, MD  fenofibrate (TRICOR) 145 MG tablet Take 1 tablet by mouth daily. 06/20/21   [provider]  fenofibrate 160 MG tablet Take 160 mg by mouth daily. Patient not taking: Reported on 10/14/2021    [provider]  fluconazole (DIFLUCAN) 100 MG tablet Take 100 mg by mouth daily. 09/30/21   [provider]  furosemide (LASIX) 20 MG tablet Take 20 mg by mouth as needed. Patient not taking: Reported on 10/14/2021    [provider]  gabapentin (NEURONTIN) 300 MG capsule Take 300 mg by mouth 2 (two) times a day. Patient not taking: Reported on 10/14/2021 05/02/19   [provider]  gabapentin (NEURONTIN) 600 MG tablet Take 600 mg by mouth 4 (four) times daily. 07/14/21   [provider]  ibuprofen (ADVIL) 200 MG tablet Take by mouth.    [provider]  levofloxacin (LEVAQUIN) 500 MG tablet Take 500 mg by mouth daily. Patient not taking: Reported on 10/14/2021 09/27/21   [provider]  metFORMIN (GLUCOPHAGE-XR) 500 MG 24 hr tablet Take 1 tablet (500 mg total) by mouth daily with breakfast. 11/19/19   Mariea Clonts, Courage, MD  metFORMIN (GLUCOPHAGE-XR) 500 MG 24 hr tablet Take by mouth. Patient not taking: Reported on 10/14/2021 07/18/21   [provider]  mometasone-formoterol (DULERA) 200-5 MCG/ACT AERO Inhale 2 puffs into the lungs 2 (two) times daily. 07/02/19   Nyoka Cowden, MD  montelukast  (SINGULAIR) 10 MG tablet Take 10 mg by mouth at bedtime. Patient not taking: Reported on 10/14/2021    [provider]  ondansetron (ZOFRAN) 4 MG tablet Take 1 tablet (4 mg total) by mouth every 6 (six) hours as needed for nausea or vomiting. 10/12/17   Lavera Guise, MD  pramipexole (MIRAPEX) 1 MG tablet Take 1.5 mg by mouth at bedtime.     [provider]  valsartan (DIOVAN) 80 MG tablet Take 80 mg by mouth daily.    [provider]      Allergies    Omnicef [cefdinir], Amoxicillin, Celebrex [celecoxib], Codeine, Penicillins, and Sulfa antibiotics    Review of Systems   Review of Systems  HENT:  Positive for congestion, sinus pain and sore throat.   Respiratory:  Positive for cough.    Physical Exam Updated Vital Signs BP 104/63    Pulse 79    Temp 98.4 F (36.9 C) (Oral)    Resp 19    Ht 5\' 5"  (1.651 m)    Wt 86.2 kg    SpO2 95%    BMI 31.62 kg/m  Physical Exam Vitals and nursing note reviewed.  Constitutional:      General: She is not in acute distress.    Appearance: She is well-developed.  HENT:     Head: Normocephalic and atraumatic.     Nose: Mucosal edema and congestion present.     Mouth/Throat:     Mouth: Mucous membranes are moist.  Eyes:     General: Vision grossly intact. Gaze aligned appropriately.     Extraocular Movements: Extraocular movements intact.     Conjunctiva/sclera: Conjunctivae normal.  Cardiovascular:     Rate and Rhythm: Normal rate and regular rhythm.     Pulses: Normal pulses.     Heart sounds: Normal heart sounds, S1 normal and S2 normal. No murmur heard.   No friction rub. No gallop.  Pulmonary:     Effort: Pulmonary effort is normal. No respiratory distress.     Breath sounds: Normal breath sounds.  Abdominal:     General: Bowel sounds are normal.     Palpations: Abdomen is soft.     Tenderness: There is no abdominal tenderness. There is no guarding or rebound.     Hernia: No hernia is present.   Musculoskeletal:        General: No swelling.     Cervical back: Full passive range of motion without pain, normal range of motion and neck supple. No spinous process tenderness or muscular tenderness. Normal range of motion.     Right lower leg: No edema.     Left lower leg: No edema.  Skin:    General: Skin is warm and dry.     Capillary Refill: Capillary refill takes less than 2 seconds.     Findings: No ecchymosis, erythema, rash or wound.  Neurological:     General: No focal deficit present.     Mental Status: She is alert and oriented to person, place, and time.  GCS: GCS eye subscore is 4. GCS verbal subscore is 5. GCS motor subscore is 6.     Cranial Nerves: Cranial nerves 2-12 are intact.     Sensory: Sensation is intact.     Motor: Motor function is intact.     Coordination: Coordination is intact.  Psychiatric:        Attention and Perception: Attention normal.        Mood and Affect: Mood normal.        Speech: Speech normal.        Behavior: Behavior normal.    ED Results / Procedures / Treatments   Labs (all labs ordered are listed, but only abnormal results are displayed) Labs Reviewed - No data to display  EKG None  Radiology No results found.  Procedures Procedures    Medications Ordered in ED Medications - No data to display  ED Course/ Medical Decision Making/ A&P                           Medical Decision Making  Patient presents with URI symptoms.  She is currently on Cipro, so this is unlikely to be a bacterial sinusitis or pneumonia.  Symptoms are more consistent with a viral process.  Will treat symptomatically.  She needs a work note.        Final Clinical Impression(s) / ED Diagnoses Final diagnoses:  Upper respiratory tract infection, unspecified type    Rx / DC Orders ED Discharge Orders          Ordered    benzonatate (TESSALON) 200 MG capsule  3 times daily PRN        01/08/22 0618    loratadine-pseudoephedrine  (CLARITIN-D 24 HOUR) 10-240 MG 24 hr tablet  Daily        01/08/22 0618    albuterol (VENTOLIN HFA) 108 (90 Base) MCG/ACT inhaler  Every 4 hours PRN        01/08/22 0618              Gilda Crease, MD 01/08/22 [phone]

## 2022-01-08 NOTE — ED Triage Notes (Signed)
Congestion for 3 days.  Denies fever.  Last had tylenol or ibuprofen over 8 hours ago.  Has not taken any mucinex/robitussin/etc for the cough or congestion.   Has not been to pcp for this. But was at pcp recently for UTI and URI was seen 2/10

## 2022-05-28 ENCOUNTER — Emergency Department (HOSPITAL_COMMUNITY)
Admission: EM | Admit: 2022-05-28 | Discharge: 2022-05-28 | Discharge status: Against Medical Advice | Payer: Medicaid Other | Source: Home / Self Care

## 2022-05-31 ENCOUNTER — Emergency Department (HOSPITAL_COMMUNITY)
Admission: EM | Admit: 2022-05-31 | Discharge: 2022-05-31 | Disposition: A | Discharge status: Home / Self Care | Payer: Medicaid Other | Attending: Emergency Medicine | Admitting: Emergency Medicine

## 2022-05-31 ENCOUNTER — Emergency Department (HOSPITAL_COMMUNITY): Payer: Medicaid Other

## 2022-05-31 ENCOUNTER — Other Ambulatory Visit: Payer: Self-pay

## 2022-05-31 ENCOUNTER — Encounter (HOSPITAL_COMMUNITY): Payer: Self-pay | Admitting: *Deleted

## 2022-05-31 DIAGNOSIS — I10 Essential (primary) hypertension: Secondary | ICD-10-CM | POA: Insufficient documentation

## 2022-05-31 DIAGNOSIS — M79674 Pain in right toe(s): Secondary | ICD-10-CM | POA: Insufficient documentation

## 2022-05-31 DIAGNOSIS — Z79899 Other long term (current) drug therapy: Secondary | ICD-10-CM | POA: Diagnosis not present

## 2022-05-31 MED ORDER — GADOBUTROL 1 MMOL/ML IV SOLN
8.5000 mL | Freq: Once | INTRAVENOUS | Status: AC | PRN
  Administered 2022-05-31: 8.5 mL via INTRAVENOUS

## 2022-05-31 MED ORDER — ACETAMINOPHEN 325 MG PO TABS
650.0000 mg | ORAL_TABLET | Freq: Four times a day (QID) | ORAL | Status: DC | PRN
  Administered 2022-05-31: 650 mg via ORAL
  Filled 2022-05-31: qty 2

## 2022-05-31 NOTE — ED Notes (Signed)
Pt returned from MRI °

## 2022-05-31 NOTE — ED Notes (Signed)
Patient transported to MRI 

## 2022-05-31 NOTE — Discharge Instructions (Signed)
Please return to the ED with any new or worsening symptoms.  As discussed, there was no fracture of your great toe seen today on imaging. Please begin taking ibuprofen and Tylenol at home for swelling control Please continue to ice the foot at home.  Please also utilize the RICE method.  This is rest, ice, compression and elevation to the affected area Please remain in postop boot for comfort.  He may also continue to buddy tape the toe at home for comfort.

## 2022-05-31 NOTE — ED Provider Notes (Signed)
[facility] EMERGENCY DEPARTMENT Provider Note   CSN: [id number] Arrival date & time: 05/31/22  1351     History  Chief Complaint  Patient presents with   Toe Pain    Isabella Buchanan is a 41 y.o. female with history of chronic back pain, chronic flank pain, headaches, hypertension, IBS, kidney stones, PCOS, seizures.  Patient presents to the ED for evaluation of right great toe pain.  Patient reports that on 7/9 she was preparing to make a lasagna at home when the lasagna fell onto her right great toe.  Patient reports the lasagna was frozen at this time.  Patient states that she had immediate pain and swelling to her right toe.  Patient states that she presented to ED at this time, was given ice pack and advised that if it was broken they would just "wrap it".  Patient states that she was seen by provider but on chart review it appears the patient left without being seen prior to having toe imaged.  Patient reports that she has not been taking ibuprofen or Tylenol at home.  Patient states that the swelling is increased however the pain has not increased.  Patient states that she also jammed the toe into her grandmother's walker yesterday which exacerbated swelling she believes.  Patient denies any numbness or tingling of the affected toe.   Toe Pain       Home Medications Prior to Admission medications   Medication Sig Start Date End Date Taking? Authorizing Provider  acetaminophen (TYLENOL) 325 MG tablet Take 2 tablets (650 mg total) by mouth every 6 (six) hours as needed for mild pain or fever. 11/19/19   Shon Hale, MD  albuterol (VENTOLIN HFA) 108 (90 Base) MCG/ACT inhaler Inhale 2 puffs into the lungs every 4 (four) hours as needed for wheezing or shortness of breath. 01/08/22   Gilda Crease, MD  atorvastatin (LIPITOR) 20 MG tablet Take 20 mg by mouth daily. Patient not taking: Reported on 10/14/2021    [provider]  atorvastatin (LIPITOR) 40 MG tablet  Take by mouth. 07/15/20   [provider]  benzonatate (TESSALON) 200 MG capsule Take 1 capsule (200 mg total) by mouth 3 (three) times daily as needed for cough. 01/08/22   Gilda Crease, MD  ciprofloxacin-dexamethasone (CIPRODEX) OTIC suspension SMARTSIG:4 Drop(s) Right Ear Twice Daily Patient not taking: Reported on 10/14/2021 08/02/21   [provider]  citalopram (CELEXA) 20 MG tablet Take by mouth. Patient not taking: Reported on 10/14/2021 05/26/19   [provider]  clonazePAM (KLONOPIN) 0.5 MG tablet Take 0.25-0.5 mg by mouth 2 (two) times daily as needed. Patient not taking: Reported on 10/14/2021 08/08/17   [provider]  clonazePAM (KLONOPIN) 1 MG tablet Take 1 mg by mouth 2 (two) times daily as needed for anxiety. 06/24/21   [provider]  dapagliflozin propanediol (FARXIGA) 10 MG TABS tablet Take 1 tablet by mouth daily. 06/19/21   [provider]  dicyclomine (BENTYL) 20 MG tablet Take 1 tablet (20 mg total) by mouth 2 (two) times daily. Patient not taking: Reported on 10/14/2021 02/16/21   Pauline Aus, PA-C  esomeprazole (NEXIUM) 40 MG capsule Take 1 capsule (40 mg total) by mouth daily with breakfast. 11/19/19   Shon Hale, MD  fenofibrate (TRICOR) 145 MG tablet Take 1 tablet by mouth daily. 06/20/21   [provider]  fenofibrate 160 MG tablet Take 160 mg by mouth daily. Patient not taking: Reported on 10/14/2021  [provider]  fluconazole (DIFLUCAN) 100 MG tablet Take 100 mg by mouth daily. 09/30/21   [provider]  furosemide (LASIX) 20 MG tablet Take 20 mg by mouth as needed. Patient not taking: Reported on 10/14/2021    [provider]  gabapentin (NEURONTIN) 300 MG capsule Take 300 mg by mouth 2 (two) times a day. Patient not taking: Reported on 10/14/2021 05/02/19   [provider]  gabapentin (NEURONTIN) 600 MG tablet Take 600 mg by mouth 4 (four) times  daily. 07/14/21   [provider]  ibuprofen (ADVIL) 200 MG tablet Take by mouth.    [provider]  levofloxacin (LEVAQUIN) 500 MG tablet Take 500 mg by mouth daily. Patient not taking: Reported on 10/14/2021 09/27/21   [provider]  loratadine-pseudoephedrine (CLARITIN-D 24 HOUR) 10-240 MG 24 hr tablet Take 1 tablet by mouth daily. 01/08/22   Gilda Crease, MD  metFORMIN (GLUCOPHAGE-XR) 500 MG 24 hr tablet Take 1 tablet (500 mg total) by mouth daily with breakfast. 11/19/19   Mariea Clonts, Courage, MD  metFORMIN (GLUCOPHAGE-XR) 500 MG 24 hr tablet Take by mouth. Patient not taking: Reported on 10/14/2021 07/18/21   [provider]  mometasone-formoterol (DULERA) 200-5 MCG/ACT AERO Inhale 2 puffs into the lungs 2 (two) times daily. 07/02/19   Nyoka Cowden, MD  montelukast (SINGULAIR) 10 MG tablet Take 10 mg by mouth at bedtime. Patient not taking: Reported on 10/14/2021    [provider]  ondansetron (ZOFRAN) 4 MG tablet Take 1 tablet (4 mg total) by mouth every 6 (six) hours as needed for nausea or vomiting. 10/12/17   Lavera Guise, MD  pramipexole (MIRAPEX) 1 MG tablet Take 1.5 mg by mouth at bedtime.     [provider]  valsartan (DIOVAN) 80 MG tablet Take 80 mg by mouth daily.    [provider]      Allergies    Omnicef [cefdinir], Amoxicillin, Celebrex [celecoxib], Codeine, Penicillins, and Sulfa antibiotics    Review of Systems   Review of Systems  Musculoskeletal:  Positive for arthralgias and myalgias.  Neurological:  Negative for numbness.  All other systems reviewed and are negative.   Physical Exam Updated Vital Signs BP (!) 155/88 (BP Location: Right Arm)   Pulse 89   Temp 98 F (36.7 C) (Oral)   Resp 18   Ht 5\' 4"  (1.626 m)   LMP 05/06/2022   SpO2 96%   BMI 32.62 kg/m  Physical Exam Vitals and nursing note reviewed.  Constitutional:      General: She is not in acute distress.     Appearance: Normal appearance. She is not ill-appearing, toxic-appearing or diaphoretic.  HENT:     Head: Normocephalic and atraumatic.     Nose: Nose normal. No congestion.     Mouth/Throat:     Mouth: Mucous membranes are moist.     Pharynx: Oropharynx is clear.  Eyes:     Extraocular Movements: Extraocular movements intact.     Conjunctiva/sclera: Conjunctivae normal.     Pupils: Pupils are equal, round, and reactive to light.  Cardiovascular:     Rate and Rhythm: Normal rate and regular rhythm.  Pulmonary:     Effort: Pulmonary effort is normal.     Breath sounds: Normal breath sounds. No wheezing.  Abdominal:     General: Abdomen is flat. Bowel sounds are normal.     Palpations: Abdomen is soft.     Tenderness: There is no abdominal  tenderness.  Musculoskeletal:     Cervical back: Normal range of motion and neck supple. No tenderness.     Comments: Right great toe with ecchymosis and swelling, no erythema.  No laceration.  Patient has decreased range of motion secondary to pain.  Patient neurovascularly intact.  Skin:    General: Skin is warm and dry.     Capillary Refill: Capillary refill takes less than 2 seconds.  Neurological:     Mental Status: She is alert and oriented to person, place, and time.     ED Results / Procedures / Treatments   Labs (all labs ordered are listed, but only abnormal results are displayed) Labs Reviewed - No data to display  EKG None  Radiology MR FOOT RIGHT W WO CONTRAST  Result Date: 05/31/2022 CLINICAL DATA:  Right great toe pain after injury.  Abnormal x-ray EXAM: MRI OF THE RIGHT FOREFOOT WITHOUT AND WITH CONTRAST TECHNIQUE: Multiplanar, multisequence MR imaging of the right forefoot was performed before and after the administration of intravenous contrast. CONTRAST:  8.31mL GADAVIST GADOBUTROL 1 MMOL/ML IV SOLN COMPARISON:  X-ray 05/31/2022 FINDINGS: Bones/Joint/Cartilage No acute fracture. No dislocation. No erosion. No focal bone  marrow edema or marrow replacement. There is increased T2 signal within the toes on T2 weighted imaging which is artifactual as no corresponding signal abnormality is seen on STIR sequences. No abnormal marrow enhancement. 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 Trace joint effusion of the IP joint. No enhancing synovitis is seen. Ligaments Intact Lisfranc ligament.  Collateral ligaments intact. Muscles and Tendons Trace tenosynovitis of the extensor hallucis longus tendon. Flexor and extensor tendons are otherwise intact and unremarkable. Normal muscle bulk and signal intensity without edema, atrophy, or fatty infiltration. Soft tissues Mild subcutaneous edema most pronounced at the dorsal aspect of the great toe extending to the level of the first metatarsal head. No organized or rim enhancing fluid collection. IMPRESSION: 1. No acute osseous abnormality of the right forefoot. Specifically, no evidence of osteomyelitis of the great toe. 2. Mild subcutaneous edema most pronounced at the dorsal aspect of the great toe extending to the level of the first metatarsal head. No organized or rim enhancing fluid collection. 3. Trace tenosynovitis of the extensor hallucis longus tendon. 4. 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 Electronically Signed   By: Duanne Guess D.O.   On: 05/31/2022 16:39   DG Toe Great Right  Result Date: 05/31/2022 CLINICAL DATA:  sp object falling onto great toe on right side EXAM: RIGHT GREAT TOE COMPARISON:  Toe radiograph 09/06/2018 FINDINGS: There is great toe soft tissue swelling. There is no definite acute fracture. There is mild lucency of the lateral aspect of the distal phalangeal tuft, similar prior exam, but with increased lucency along the plantar aspect. There is mild interphalangeal joint osteoarthritis. IMPRESSION: Great toe soft tissue swelling with increased lucency along the plantar aspect of the distal phalangeal tuft in  comparison to prior. This could potentially represent osteomyelitis. MRI would be more sensitive. No definite acute fracture. Electronically Signed   By: Caprice Renshaw M.D.   On: 05/31/2022 15:40    Procedures Procedures   Medications Ordered in ED Medications  acetaminophen (TYLENOL) tablet 650 mg (650 mg Oral Given 05/31/22 1510)  gadobutrol (GADAVIST) 1 MMOL/ML injection 8.5 mL (8.5 mLs Intravenous Contrast Given 05/31/22 1623)    ED Course/ Medical Decision Making/ A&P  Medical Decision Making Amount and/or Complexity of Data Reviewed Radiology: ordered.  Risk OTC drugs. Prescription drug management.   41 year old female presents to the ED for evaluation.  Please see HPI for further details.  On examination, the patient right great toe has swelling and ecchymosis.  There is no erythema to suggest cellulitis.  The patient is neurovascularly intact to baseline.  The patient is missing the toenail of the right great toe however she states that this occurred years ago.  Patient worked up utilizing the following imaging studies interpreted by me personally: - Plain film imaging of right great toe shows increased cortical lucency which concerning for osteomyelitis.  Radiologist recommends MRI - MR great toe shows no signs of acute osseous abnormality, no osteomyelitis of the great toe.  There is subcutaneous edema of the dorsal aspect of great toe  Patient will have toes buddy taped, placed in postop shoe for comfort.  The patient will be advised to follow-up with her PCP for ongoing needs.  Patient advised that swelling of toe will most likely decrease over time, patient was advised to begin taking ibuprofen and Tylenol at home for pain and swelling.  Patient had all of her questions answered to her satisfaction.  The patient was given return precautions and she voiced understanding with these.  The patient is stable at this time for discharge home  Final  Clinical Impression(s) / ED Diagnoses Final diagnoses:  Pain of toe of right foot    Rx / DC Orders ED Discharge Orders     None         Clent Ridges 05/31/22 1704    Gloris Manchester, MD 06/02/22 1210

## 2022-05-31 NOTE — ED Triage Notes (Signed)
Pt in c/o R big toe and reports being seen on Saturday for a broken toe, pt reports that her swelling is worse and she is a diabetic, denies worsening pain, ambulatory with pain, A&O x4

## 2022-06-17 ENCOUNTER — Other Ambulatory Visit: Payer: Self-pay

## 2022-06-17 ENCOUNTER — Emergency Department (HOSPITAL_COMMUNITY)
Admission: EM | Admit: 2022-06-17 | Discharge: 2022-06-17 | Disposition: A | Discharge status: Home / Self Care | Payer: Medicaid Other | Attending: Emergency Medicine | Admitting: Emergency Medicine

## 2022-06-17 ENCOUNTER — Emergency Department (HOSPITAL_COMMUNITY): Payer: Medicaid Other

## 2022-06-17 ENCOUNTER — Encounter (HOSPITAL_COMMUNITY): Payer: Self-pay | Admitting: *Deleted

## 2022-06-17 DIAGNOSIS — R062 Wheezing: Secondary | ICD-10-CM | POA: Diagnosis present

## 2022-06-17 DIAGNOSIS — Z7982 Long term (current) use of aspirin: Secondary | ICD-10-CM | POA: Diagnosis not present

## 2022-06-17 DIAGNOSIS — Z20822 Contact with and (suspected) exposure to covid-19: Secondary | ICD-10-CM | POA: Diagnosis not present

## 2022-06-17 DIAGNOSIS — R590 Localized enlarged lymph nodes: Secondary | ICD-10-CM | POA: Insufficient documentation

## 2022-06-17 DIAGNOSIS — J40 Bronchitis, not specified as acute or chronic: Secondary | ICD-10-CM

## 2022-06-17 DIAGNOSIS — E119 Type 2 diabetes mellitus without complications: Secondary | ICD-10-CM | POA: Insufficient documentation

## 2022-06-17 DIAGNOSIS — Z7984 Long term (current) use of oral hypoglycemic drugs: Secondary | ICD-10-CM | POA: Diagnosis not present

## 2022-06-17 DIAGNOSIS — Z79899 Other long term (current) drug therapy: Secondary | ICD-10-CM | POA: Diagnosis not present

## 2022-06-17 LAB — RESP PANEL BY RT-PCR (FLU A&B, COVID) ARPGX2
Influenza A by PCR: NEGATIVE
Influenza B by PCR: NEGATIVE
SARS Coronavirus 2 by RT PCR: NEGATIVE

## 2022-06-17 MED ORDER — DEXAMETHASONE SODIUM PHOSPHATE 10 MG/ML IJ SOLN
10.0000 mg | Freq: Once | INTRAMUSCULAR | Status: AC
  Administered 2022-06-17: 10 mg via INTRAMUSCULAR
  Filled 2022-06-17: qty 1

## 2022-06-17 MED ORDER — AEROCHAMBER PLUS FLO-VU MEDIUM MISC
1.0000 | Freq: Once | Status: AC
  Administered 2022-06-17: 1

## 2022-06-17 MED ORDER — BENZONATATE 100 MG PO CAPS: 100.0000 mg | ORAL_CAPSULE | Freq: Three times a day (TID) | ORAL | 0 refills | Status: AC

## 2022-06-17 NOTE — ED Notes (Signed)
To x-ray

## 2022-06-17 NOTE — ED Provider Notes (Signed)
[facility] EMERGENCY DEPARTMENT Provider Note   CSN: [id number] Arrival date & time: 06/17/22  0756     History  Chief Complaint  Patient presents with   Cough    Isabella Buchanan is a 41 y.o. female.   Cough   This patient is a 41 year old female, Isabella Buchanan has a history of underlying reactive airway disease for which Isabella Buchanan uses Dulera and albuterol.  Isabella Buchanan is a diabetic on Farxiga insulin and metformin, Isabella Buchanan presents to the hospital today because of a complaint of a cough.  Isabella Buchanan reports the cough started 10 days ago, it is productive of green phlegm occasionally, Isabella Buchanan is not having fevers or chills but Isabella Buchanan has had a bit of a sore throat, runny nose and congestion.  Isabella Buchanan states that Isabella Buchanan saw her family doctor 4 days ago by a virtual visit and they prescribed doxycycline, hydrocodone containing cough syrup and an albuterol inhaler but Isabella Buchanan states none of that is helping and Isabella Buchanan continues to cough.  The patient denies any swelling of the legs, vomiting or diarrhea.  Isabella Buchanan has not had any sick contacts.  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 Authorizing Provider  benzonatate (TESSALON) 100 MG capsule Take 1 capsule (100 mg total) by mouth every 8 (eight) hours. 06/17/22  Yes Eber Hong, MD  acetaminophen (TYLENOL) 325 MG tablet Take 2 tablets (650 mg total) by mouth every 6 (six) hours as needed for mild pain or fever. 11/19/19   Shon Hale, MD  albuterol (VENTOLIN HFA) 108 (90 Base) MCG/ACT inhaler Inhale 2 puffs into the lungs every 4 (four) hours as needed for wheezing or shortness of breath. 01/08/22   Gilda Crease, MD  atorvastatin (LIPITOR) 20 MG tablet Take 20 mg by mouth daily. Patient not taking: Reported  on 10/14/2021    [provider]  atorvastatin (LIPITOR) 40 MG tablet Take by mouth. 07/15/20   [provider]  ciprofloxacin-dexamethasone (CIPRODEX) OTIC suspension SMARTSIG:4 Drop(s) Right Ear Twice Daily Patient not taking: Reported on 10/14/2021 08/02/21   [provider]  citalopram (CELEXA) 20 MG tablet Take by mouth. Patient not taking: Reported on 10/14/2021 05/26/19   [provider]  clonazePAM (KLONOPIN) 0.5 MG tablet Take 0.25-0.5 mg by mouth 2 (two) times daily as needed. Patient not taking: Reported on 10/14/2021 08/08/17   [provider]  clonazePAM (KLONOPIN) 1 MG tablet Take 1 mg by mouth 2 (two) times daily as needed for anxiety. 06/24/21   [provider]  dapagliflozin propanediol (FARXIGA) 10 MG TABS tablet Take 1 tablet by mouth daily. 06/19/21   [provider]  dicyclomine (BENTYL) 20 MG tablet Take 1 tablet (20 mg total) by mouth 2 (two) times daily. Patient not taking: Reported on 10/14/2021 02/16/21   Pauline Aus, PA-C  esomeprazole (NEXIUM) 40 MG capsule Take 1 capsule (40 mg total) by mouth daily with breakfast. 11/19/19   Shon Hale, MD  fenofibrate (TRICOR) 145 MG tablet Take 1 tablet by mouth daily. 06/20/21   [provider]  fenofibrate 160 MG tablet Take 160 mg by mouth daily. Patient not taking: Reported on 10/14/2021    [provider]  fluconazole (DIFLUCAN) 100  MG tablet Take 100 mg by mouth daily. 09/30/21   [provider]  furosemide (LASIX) 20 MG tablet Take 20 mg by mouth as needed. Patient not taking: Reported on 10/14/2021    [provider]  gabapentin (NEURONTIN) 300 MG capsule Take 300 mg by mouth 2 (two) times a day. Patient not taking: Reported on 10/14/2021 05/02/19   [provider]  gabapentin (NEURONTIN) 600 MG tablet Take 600 mg by mouth 4 (four) times daily. 07/14/21   [provider]  ibuprofen (ADVIL) 200 MG tablet Take  by mouth.    [provider]  levofloxacin (LEVAQUIN) 500 MG tablet Take 500 mg by mouth daily. Patient not taking: Reported on 10/14/2021 09/27/21   [provider]  loratadine-pseudoephedrine (CLARITIN-D 24 HOUR) 10-240 MG 24 hr tablet Take 1 tablet by mouth daily. 01/08/22   Gilda Crease, MD  metFORMIN (GLUCOPHAGE-XR) 500 MG 24 hr tablet Take 1 tablet (500 mg total) by mouth daily with breakfast. 11/19/19   Mariea Clonts, Courage, MD  metFORMIN (GLUCOPHAGE-XR) 500 MG 24 hr tablet Take by mouth. Patient not taking: Reported on 10/14/2021 07/18/21   [provider]  mometasone-formoterol (DULERA) 200-5 MCG/ACT AERO Inhale 2 puffs into the lungs 2 (two) times daily. 07/02/19   Nyoka Cowden, MD  montelukast (SINGULAIR) 10 MG tablet Take 10 mg by mouth at bedtime. Patient not taking: Reported on 10/14/2021    [provider]  ondansetron (ZOFRAN) 4 MG tablet Take 1 tablet (4 mg total) by mouth every 6 (six) hours as needed for nausea or vomiting. 10/12/17   Lavera Guise, MD  pramipexole (MIRAPEX) 1 MG tablet Take 1.5 mg by mouth at bedtime.     [provider]  valsartan (DIOVAN) 80 MG tablet Take 80 mg by mouth daily.    [provider]      Allergies    Omnicef [cefdinir], Amoxicillin, Celebrex [celecoxib], Codeine, Penicillins, and Sulfa antibiotics    Review of Systems   Review of Systems  Respiratory:  Positive for cough.   All other systems reviewed and are negative.   Physical Exam Updated Vital Signs BP 111/74   Pulse 70   Resp 16   Ht 1.651 m (5\' 5" )   Wt 84.8 kg   LMP 06/10/2022 (Approximate)   SpO2 95%   BMI 31.12 kg/m  Physical Exam Vitals and nursing note reviewed.  Constitutional:      General: Isabella Buchanan is not in acute distress.    Appearance: Isabella Buchanan is well-developed.  HENT:     Head: Normocephalic and atraumatic.     Mouth/Throat:     Pharynx: No oropharyngeal exudate.  Eyes:     General: No scleral icterus.        Right eye: No discharge.        Left eye: No discharge.     Conjunctiva/sclera: Conjunctivae normal.     Pupils: Pupils are equal, round, and reactive to light.  Neck:     Thyroid: No thyromegaly.     Vascular: No JVD.  Cardiovascular:     Rate and Rhythm: Normal rate and regular rhythm.     Heart sounds: Normal heart sounds. No murmur heard.    No friction rub. No gallop.  Pulmonary:     Effort: Pulmonary effort is normal. No respiratory distress.     Breath sounds: Wheezing present. No rales.  Abdominal:     General: Bowel sounds are normal. There is no distension.  Palpations: Abdomen is soft. There is no mass.     Tenderness: There is no abdominal tenderness.  Musculoskeletal:        General: No tenderness. Normal range of motion.     Cervical back: Normal range of motion and neck supple.  Lymphadenopathy:     Cervical: Cervical adenopathy present.  Skin:    General: Skin is warm and dry.     Findings: No erythema or rash.  Neurological:     Mental Status: Isabella Buchanan is alert.     Coordination: Coordination normal.  Psychiatric:        Behavior: Behavior normal.     ED Results / Procedures / Treatments   Labs (all labs ordered are listed, but only abnormal results are displayed) Labs Reviewed  RESP PANEL BY RT-PCR (FLU A&B, COVID) [id number]    EKG None  Radiology DG Chest 2 View  Result Date: 06/17/2022 CLINICAL DATA:  41 year old female with history of persistent recurrent cough. EXAM: CHEST - 2 VIEW COMPARISON:  Chest x-ray 10/14/2021. FINDINGS: Lung volumes are normal. No consolidative airspace disease. No pleural effusions. No pneumothorax. No pulmonary nodule or mass noted. Pulmonary vasculature and the cardiomediastinal silhouette are within normal limits. IMPRESSION: No radiographic evidence of acute cardiopulmonary disease. Electronically Signed   By: Trudie Reed M.D.   On: 06/17/2022 09:18    Procedures Procedures    Medications Ordered in  ED Medications  dexamethasone (DECADRON) injection 10 mg (10 mg Intramuscular Given 06/17/22 0837)  AeroChamber Plus Flo-Vu Medium MISC 1 each (1 each Other Given 06/17/22 1610)    ED Course/ Medical Decision Making/ A&P                           Medical Decision Making Amount and/or Complexity of Data Reviewed Radiology: ordered.  Risk Prescription drug management.   This patient appears to be in no distress, Isabella Buchanan is coughing frequently throughout the exam and does have an expiratory wheeze but has no tachycardia no edema and her upper respiratory exam is unremarkable except for mild lymphadenopathy of the neck  We will test for COVID and the flu, check a chest x-ray, patient will be given a shot of Decadron.  I did have a discussion with the patient regarding the benefits and alternatives of using a steroid especially when you are a diabetic.  Isabella Buchanan states that Isabella Buchanan does much better with the intramuscular injection and does poorly with prednisone stating that it makes her very hyperglycemic.  Isabella Buchanan is not hypoxic, Isabella Buchanan is not tachycardic, Isabella Buchanan is not febrile.  The patient is agreeable to the plan  Imaging: I personally viewed and interpreted the x-rays which show no acute findings, I agree with radiologist interpretation.  Labs: At the time of discharge the COVID and flu test are pending and the patient can follow these up.  Either way Isabella Buchanan would not need any medications that Isabella Buchanan has been symptomatic for 10 days and is not hypoxic  I cautioned the patient about her being around others while Isabella Buchanan is potentially contagious and Isabella Buchanan is agreeable to stay to work for a few days          Final Clinical Impression(s) / ED Diagnoses Final diagnoses:  Bronchitis    Rx / DC Orders ED Discharge Orders          Ordered    benzonatate (TESSALON) 100 MG capsule  Every 8 hours  06/17/22 0935              Eber Hong, MD 06/17/22 [id number]

## 2022-06-17 NOTE — ED Notes (Signed)
EDP into room during triage, at [facility]

## 2022-06-17 NOTE — Discharge Instructions (Addendum)
Albuterol is an inhaled medication which can help you to breathe better, you should take 2 puffs every 4 hours as needed, this may cause your heart to feel like it is racing, this should be a temporary side effect.  Doxycycline is an antibiotic which is taken twice a day, this treats bacterial infections that can cause staph infections, it treats sinus infections and some pneumonia.  In this case I would like for you to take the antibiotic exactly as prescribed until it is completed.  Please be aware that occasionally people will get a rash if they are in the sunlight for extended periods of time while taking this medicine.  Tessalon is a cough medication that helps reduce the amount of coughing that you are having.  You may take up to 200 mg every 8 hours as needed.  This can be used safely with most or other over-the-counter medications but talk to your pharmacist before taking anything else over-the-counter with it  Your x-ray shows no signs of pneumonia, we have given you a steroid shot which should help a little bit with the coughing however I would also encourage you to stop smoking entirely as this will help you to heal faster as well and will be good for your long-term health.  The above medications I want you to take including continuing your inhaler, continuing the antibiotic until it is gone and adding in the cough medication.  You should no longer take the liquid hydrocodone containing cough medication especially when you take Klonopin as together they can be very dangerous for your health.  Thank you for allowing [place] to treat you in the emergency department today.  After reviewing your examination and potential testing that was done it appears that you are safe to go home.  I would like for you to follow-up with your doctor within the next several days, have them obtain your results and follow-up with them to review all of these tests.  If you should develop severe or worsening symptoms return  to the emergency department immediately

## 2022-06-17 NOTE — ED Triage Notes (Addendum)
Here for recurrent persistant cough. Onset 10d ago. Taking dulera, albuterol, doxy, hycodan. H/o DM. C/o productive cough, sob, wheezing, sore throat, and ear sx. Smoker. Pt alert, NAD, calm, interactive, resps e/u, speaking in clear complete sentences.

## 2022-11-23 ENCOUNTER — Emergency Department (HOSPITAL_COMMUNITY)
Admission: EM | Admit: 2022-11-23 | Discharge: 2022-11-23 | Discharge status: Against Medical Advice | Payer: Medicaid Other | Attending: Emergency Medicine | Admitting: Emergency Medicine

## 2022-11-23 ENCOUNTER — Emergency Department (HOSPITAL_COMMUNITY): Payer: Medicaid Other

## 2022-11-23 ENCOUNTER — Encounter (HOSPITAL_COMMUNITY): Payer: Self-pay

## 2022-11-23 DIAGNOSIS — R079 Chest pain, unspecified: Secondary | ICD-10-CM | POA: Insufficient documentation

## 2022-11-23 DIAGNOSIS — R110 Nausea: Secondary | ICD-10-CM | POA: Insufficient documentation

## 2022-11-23 DIAGNOSIS — Z5321 Procedure and treatment not carried out due to patient leaving prior to being seen by health care provider: Secondary | ICD-10-CM | POA: Diagnosis not present

## 2022-11-23 LAB — COMPREHENSIVE METABOLIC PANEL
ALT: 19 U/L (ref 0–44)
AST: 26 U/L (ref 15–41)
Albumin: 3.9 g/dL (ref 3.5–5.0)
Alkaline Phosphatase: 92 U/L (ref 38–126)
Anion gap: 9 (ref 5–15)
BUN: 19 mg/dL (ref 6–20)
CO2: 25 mmol/L (ref 22–32)
Calcium: 9 mg/dL (ref 8.9–10.3)
Chloride: 100 mmol/L (ref 98–111)
Creatinine, Ser: 1.04 mg/dL — ABNORMAL HIGH (ref 0.44–1.00)
GFR, Estimated: >60 mL/min (ref >60)
Glucose, Bld: 180 mg/dL — ABNORMAL HIGH (ref 70–99)
Potassium: 4 mmol/L (ref 3.5–5.1)
Sodium: 134 mmol/L — ABNORMAL LOW (ref 135–145)
Total Bilirubin: 0.7 mg/dL (ref 0.3–1.2)
Total Protein: 7.5 g/dL (ref 6.5–8.1)

## 2022-11-23 LAB — CBC WITH DIFFERENTIAL/PLATELET
Abs Immature Granulocytes: 0.03 10*3/uL (ref 0.00–0.07)
Basophils Absolute: 0.2 10*3/uL — ABNORMAL HIGH (ref 0.0–0.1)
Basophils Relative: 2 %
Eosinophils Absolute: 0.5 10*3/uL (ref 0.0–0.5)
Eosinophils Relative: 5 %
HCT: 48.4 % — ABNORMAL HIGH (ref 36.0–46.0)
Hemoglobin: 15.3 g/dL — ABNORMAL HIGH (ref 12.0–15.0)
Immature Granulocytes: 0 %
Lymphocytes Relative: 42 %
Lymphs Abs: 4.7 10*3/uL — ABNORMAL HIGH (ref 0.7–4.0)
MCH: 26.7 pg (ref 26.0–34.0)
MCHC: 31.6 g/dL (ref 30.0–36.0)
MCV: 84.6 fL (ref 80.0–100.0)
Monocytes Absolute: 0.8 10*3/uL (ref 0.1–1.0)
Monocytes Relative: 7 %
Neutro Abs: 4.9 10*3/uL (ref 1.7–7.7)
Neutrophils Relative %: 44 %
Platelets: 218 10*3/uL (ref 150–400)
RBC: 5.72 MIL/uL — ABNORMAL HIGH (ref 3.87–5.11)
RDW: 14.1 % (ref 11.5–15.5)
WBC: 11.1 10*3/uL — ABNORMAL HIGH (ref 4.0–10.5)
nRBC: 0 % (ref 0.0–0.2)

## 2022-11-23 LAB — TROPONIN I (HIGH SENSITIVITY): Troponin I (High Sensitivity): <2 ng/L (ref <18)

## 2022-11-23 NOTE — ED Triage Notes (Signed)
Pt c/o central chest pain that radiates down her left arm that started 2 hours ago. Pt describes the pain as "squeezing" and rates it 7/10, also states the left side of her face felt numb earlier but that has resolved. Pt states she has had nausea all day but denies vomiting.

## 2022-11-23 NOTE — ED Notes (Signed)
Called for pt x 1 for labs, no answer

## 2022-11-23 NOTE — ED Notes (Signed)
Called for pt, no answer x 3

## 2022-11-23 NOTE — ED Notes (Signed)
Nt attempted to obtain EKG. EKG machine not responding.

## 2022-11-23 NOTE — ED Notes (Signed)
Called for pt for repeat vitals, but no answer.

## 2022-11-23 NOTE — ED Provider Triage Note (Signed)
Emergency Medicine Provider Triage Evaluation Note  Isabella Buchanan , a 42 y.o. female  was evaluated in triage.  Pt complains of chest pain. 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 Pain has been persistent since onset. Radiation noted down left arm and described as squeezing. Took klonopin at home with no relief. Denies fever, chills, night sweats, shob, abd pain, emesis, cough, congestion  Review of Systems  Positive: See above Negative:   Physical Exam  BP 112/79   Pulse 78   Temp 97.9 F (36.6 C) (Oral)   Resp 13   Ht 5\' 5"  (1.651 m)   Wt 84.8 kg   SpO2 97%   BMI 31.12 kg/m  Gen:   Awake, no distress   Resp:  Normal effort  MSK:   Moves extremities without difficulty  Other:  No lower extremity edema. No obvious m/g/r. No obvious wheeze/rales/rhonchi  Medical Decision Making  Medically screening exam initiated at 5:33 PM.  Appropriate orders placed.  Isabella Buchanan was informed that the remainder of the evaluation will be completed by another provider, this initial triage assessment does not replace that evaluation, and the importance of remaining in the ED until their evaluation is complete.     Wilnette Kales, [place] 11/23/22 [phone]

## 2022-11-24 ENCOUNTER — Emergency Department (HOSPITAL_COMMUNITY): Payer: Medicaid Other

## 2022-11-24 ENCOUNTER — Emergency Department (HOSPITAL_COMMUNITY)
Admission: EM | Admit: 2022-11-24 | Discharge: 2022-11-24 | Disposition: A | Discharge status: Home / Self Care | Payer: Medicaid Other | Attending: Emergency Medicine | Admitting: Emergency Medicine

## 2022-11-24 ENCOUNTER — Encounter (HOSPITAL_COMMUNITY): Payer: Self-pay | Admitting: *Deleted

## 2022-11-24 ENCOUNTER — Other Ambulatory Visit: Payer: Self-pay

## 2022-11-24 DIAGNOSIS — Z7984 Long term (current) use of oral hypoglycemic drugs: Secondary | ICD-10-CM | POA: Diagnosis not present

## 2022-11-24 DIAGNOSIS — E1165 Type 2 diabetes mellitus with hyperglycemia: Secondary | ICD-10-CM | POA: Diagnosis not present

## 2022-11-24 DIAGNOSIS — E119 Type 2 diabetes mellitus without complications: Secondary | ICD-10-CM

## 2022-11-24 DIAGNOSIS — R072 Precordial pain: Secondary | ICD-10-CM | POA: Diagnosis not present

## 2022-11-24 DIAGNOSIS — R079 Chest pain, unspecified: Secondary | ICD-10-CM | POA: Diagnosis present

## 2022-11-24 DIAGNOSIS — R739 Hyperglycemia, unspecified: Secondary | ICD-10-CM

## 2022-11-24 LAB — CBC
HCT: 47.3 % — ABNORMAL HIGH (ref 36.0–46.0)
Hemoglobin: 14.9 g/dL (ref 12.0–15.0)
MCH: 26.9 pg (ref 26.0–34.0)
MCHC: 31.5 g/dL (ref 30.0–36.0)
MCV: 85.5 fL (ref 80.0–100.0)
Platelets: 194 10*3/uL (ref 150–400)
RBC: 5.53 MIL/uL — ABNORMAL HIGH (ref 3.87–5.11)
RDW: 13.9 % (ref 11.5–15.5)
WBC: 11.7 10*3/uL — ABNORMAL HIGH (ref 4.0–10.5)
nRBC: 0 % (ref 0.0–0.2)

## 2022-11-24 LAB — BASIC METABOLIC PANEL
Anion gap: 9 (ref 5–15)
BUN: 20 mg/dL (ref 6–20)
CO2: 26 mmol/L (ref 22–32)
Calcium: 8.7 mg/dL — ABNORMAL LOW (ref 8.9–10.3)
Chloride: 101 mmol/L (ref 98–111)
Creatinine, Ser: 0.88 mg/dL (ref 0.44–1.00)
GFR, Estimated: >60 mL/min (ref >60)
Glucose, Bld: 274 mg/dL — ABNORMAL HIGH (ref 70–99)
Potassium: 3.5 mmol/L (ref 3.5–5.1)
Sodium: 136 mmol/L (ref 135–145)

## 2022-11-24 LAB — TROPONIN I (HIGH SENSITIVITY): Troponin I (High Sensitivity): <2 ng/L (ref <18)

## 2022-11-24 MED ORDER — TRAMADOL HCL 50 MG PO TABS
50.0000 mg | ORAL_TABLET | Freq: Once | ORAL | Status: AC
  Administered 2022-11-24: 50 mg via ORAL
  Filled 2022-11-24: qty 1

## 2022-11-24 MED ORDER — IBUPROFEN 400 MG PO TABS
400.0000 mg | ORAL_TABLET | Freq: Once | ORAL | Status: AC
  Administered 2022-11-24: 400 mg via ORAL
  Filled 2022-11-24: qty 1

## 2022-11-24 NOTE — ED Notes (Addendum)
See triage notes. Pt denies sweating/vomiting/sob. Pt c/o some nausea, pain has been almost constant. Color wnl. Pt is non diaphoretic. Nad.  Pt took clonazapam last night and hydrocodone earlier this am with no changes.

## 2022-11-24 NOTE — ED Notes (Signed)
Pt states just urinated pts and has been sexually abstinent x 3 years

## 2022-11-24 NOTE — Discharge Instructions (Signed)
It was our pleasure to provide your ER care today - we hope that you feel better.  Today, your heart tests look good/normal.  Follow up closely with your cardiologist in the next 1-2 weeks.   Your blood sugar is high today (274) - continue meds, follow diabetes eating plan, and follow up closely with primary care doctor in the next couple weeks.  Return to ER if worse, new symptoms, recurrent/persistent chest pain, increased trouble breathing, or other concern.  You were given pain meds in the ER - no driving for the next 6 hours.

## 2022-11-24 NOTE — ED Triage Notes (Signed)
Pt c/o central chest pain that radiates down her left arm; pt states she has had some numbness to her left arm  Pt states she was seen here last night but was told the wait time was up to 8 hours and she left  Pt denies any sob but just feels weak

## 2022-11-24 NOTE — ED Provider Notes (Signed)
[facility] EMERGENCY DEPARTMENT Provider Note   CSN: [id number] Arrival date & time: 11/24/22  7893     History  Chief Complaint  Patient presents with   Chest Pain    Isabella Buchanan is a 42 y.o. female.  Pt c/o mid to left chestpain. Symptoms acute onset, in past two days, at rest, dull, non radiating, not pleuritic.  Denies hx cad. Indicates has seen cardiologist in past for chest pain. Denies recent exertional chest pain or discomfort. No associated nv, diaphoresis or sob or unusual doe. No leg pain or swelling. No heartburn. Indicates mom with hx heart failure. No fam hx premature cad.   The history is provided by the patient and medical records.  Chest Pain Associated symptoms: no abdominal pain, no back pain, no cough, no fever, no headache, no palpitations, no shortness of breath and no vomiting        Home Medications Prior to Admission medications   Medication Sig Start Date End Date Taking? Authorizing Provider  acetaminophen (TYLENOL) 325 MG tablet Take 2 tablets (650 mg total) by mouth every 6 (six) hours as needed for mild pain or fever. 11/19/19   Shon Hale, MD  albuterol (VENTOLIN HFA) 108 (90 Base) MCG/ACT inhaler Inhale 2 puffs into the lungs every 4 (four) hours as needed for wheezing or shortness of breath. 01/08/22   Gilda Crease, MD  atorvastatin (LIPITOR) 20 MG tablet Take 20 mg by mouth daily. Patient not taking: Reported on 10/14/2021    [provider]  atorvastatin (LIPITOR) 40 MG tablet Take by mouth. 07/15/20   [provider]  benzonatate (TESSALON) 100 MG capsule Take 1 capsule (100 mg total) by mouth every 8 (eight) hours. 06/17/22   Eber Hong, MD  ciprofloxacin-dexamethasone Bronwen Betters) OTIC suspension SMARTSIG:4 Drop(s) Right Ear Twice Daily Patient not taking: Reported on 10/14/2021 08/02/21   [provider]  citalopram (CELEXA) 20 MG tablet Take by mouth. Patient not taking: Reported on 10/14/2021  05/26/19   [provider]  clonazePAM (KLONOPIN) 0.5 MG tablet Take 0.25-0.5 mg by mouth 2 (two) times daily as needed. Patient not taking: Reported on 10/14/2021 08/08/17   [provider]  clonazePAM (KLONOPIN) 1 MG tablet Take 1 mg by mouth 2 (two) times daily as needed for anxiety. 06/24/21   [provider]  dapagliflozin propanediol (FARXIGA) 10 MG TABS tablet Take 1 tablet by mouth daily. 06/19/21   [provider]  dicyclomine (BENTYL) 20 MG tablet Take 1 tablet (20 mg total) by mouth 2 (two) times daily. Patient not taking: Reported on 10/14/2021 02/16/21   Pauline Aus, PA-C  esomeprazole (NEXIUM) 40 MG capsule Take 1 capsule (40 mg total) by mouth daily with breakfast. 11/19/19   Shon Hale, MD  fenofibrate (TRICOR) 145 MG tablet Take 1 tablet by mouth daily. 06/20/21   [provider]  fenofibrate 160 MG tablet Take 160 mg by mouth daily. Patient not taking: Reported on 10/14/2021    [provider]  fluconazole (DIFLUCAN) 100 MG tablet Take 100 mg by mouth daily. 09/30/21   [provider]  furosemide (LASIX) 20 MG tablet Take 20 mg by mouth as needed. Patient not taking: Reported on 10/14/2021    [provider]  gabapentin (NEURONTIN) 300 MG capsule Take 300 mg by mouth 2 (two) times a day. Patient not taking: Reported on 10/14/2021 05/02/19   [provider]  gabapentin (NEURONTIN) 600 MG tablet Take 600 mg by mouth 4 (four) times  daily. 07/14/21   [provider]  ibuprofen (ADVIL) 200 MG tablet Take by mouth.    [provider]  levofloxacin (LEVAQUIN) 500 MG tablet Take 500 mg by mouth daily. Patient not taking: Reported on 10/14/2021 09/27/21   [provider]  loratadine-pseudoephedrine (CLARITIN-D 24 HOUR) 10-240 MG 24 hr tablet Take 1 tablet by mouth daily. 01/08/22   Gilda Crease, MD  metFORMIN (GLUCOPHAGE-XR) 500 MG 24 hr tablet Take 1 tablet (500 mg total)  by mouth daily with breakfast. 11/19/19   Mariea Clonts, [place], MD  metFORMIN (GLUCOPHAGE-XR) 500 MG 24 hr tablet Take by mouth. Patient not taking: Reported on 10/14/2021 07/18/21   [provider]  mometasone-formoterol (DULERA) 200-5 MCG/ACT AERO Inhale 2 puffs into the lungs 2 (two) times daily. 07/02/19   Nyoka Cowden, MD  montelukast (SINGULAIR) 10 MG tablet Take 10 mg by mouth at bedtime. Patient not taking: Reported on 10/14/2021    [provider]  ondansetron (ZOFRAN) 4 MG tablet Take 1 tablet (4 mg total) by mouth every 6 (six) hours as needed for nausea or vomiting. 10/12/17   Lavera Guise, MD  pramipexole (MIRAPEX) 1 MG tablet Take 1.5 mg by mouth at bedtime.     [provider]  valsartan (DIOVAN) 80 MG tablet Take 80 mg by mouth daily.    [provider]      Allergies    Omnicef [cefdinir], Amoxicillin, Celebrex [celecoxib], Codeine, Penicillins, and Sulfa antibiotics    Review of Systems   Review of Systems  Constitutional:  Negative for fever.  HENT:  Negative for sore throat.   Eyes:  Negative for redness.  Respiratory:  Negative for cough and shortness of breath.   Cardiovascular:  Positive for chest pain. Negative for palpitations and leg swelling.  Gastrointestinal:  Negative for abdominal pain and vomiting.  Genitourinary:  Negative for flank pain.  Musculoskeletal:  Negative for back pain and neck pain.  Skin:  Negative for rash.  Neurological:  Negative for headaches.  Hematological:  Does not bruise/bleed easily.  Psychiatric/Behavioral:  Negative for confusion.     Physical Exam Updated Vital Signs BP 115/73   Pulse 70   Temp 97.7 F (36.5 C) (Oral)   Resp 11   Ht 1.651 m (5\' 5" )   Wt 84 kg   SpO2 98%   BMI 30.82 kg/m  Physical Exam Vitals and nursing note reviewed.  Constitutional:      Appearance: Normal appearance. She is well-developed.  HENT:     Head: Atraumatic.     Nose: Nose normal.      Mouth/Throat:     Mouth: Mucous membranes are moist.  Eyes:     General: No scleral icterus.    Conjunctiva/sclera: Conjunctivae normal.  Neck:     Trachea: No tracheal deviation.  Cardiovascular:     Rate and Rhythm: Normal rate and regular rhythm.     Pulses: Normal pulses.     Heart sounds: Normal heart sounds. No murmur heard.    No friction rub. No gallop.  Pulmonary:     Effort: Pulmonary effort is normal. No respiratory distress.     Breath sounds: Normal breath sounds.  Chest:     Chest wall: No tenderness.  Abdominal:     General: There is no distension.     Palpations: Abdomen is soft.     Tenderness: There is no abdominal tenderness.  Genitourinary:    Comments: No cva tenderness.  Musculoskeletal:  General: No swelling or tenderness.     Cervical back: Normal range of motion and neck supple. No rigidity. No muscular tenderness.     Right lower leg: No edema.     Left lower leg: No edema.  Skin:    General: Skin is warm and dry.     Findings: No rash.  Neurological:     Mental Status: She is alert.     Comments: Alert, speech normal.   Psychiatric:        Mood and Affect: Mood normal.     ED Results / Procedures / Treatments   Labs (all labs ordered are listed, but only abnormal results are displayed) Results for orders placed or performed during the hospital encounter of 34/19/62  Basic metabolic panel  Result Value Ref Range   Sodium 136 135 - 145 mmol/L   Potassium 3.5 3.5 - 5.1 mmol/L   Chloride 101 98 - 111 mmol/L   CO2 26 22 - 32 mmol/L   Glucose, Bld 274 (H) 70 - 99 mg/dL   BUN 20 6 - 20 mg/dL   Creatinine, Ser 0.88 0.44 - 1.00 mg/dL   Calcium 8.7 (L) 8.9 - 10.3 mg/dL   GFR, Estimated >60 >60 mL/min   Anion gap 9 5 - 15  CBC  Result Value Ref Range   WBC 11.7 (H) 4.0 - 10.5 K/uL   RBC 5.53 (H) 3.87 - 5.11 MIL/uL   Hemoglobin 14.9 12.0 - 15.0 g/dL   HCT 47.3 (H) 36.0 - 46.0 %   MCV 85.5 80.0 - 100.0 fL   MCH 26.9 26.0 - 34.0 pg    MCHC 31.5 30.0 - 36.0 g/dL   RDW 13.9 11.5 - 15.5 %   Platelets 194 150 - 400 K/uL   nRBC 0.0 0.0 - 0.2 %  Troponin I (High Sensitivity)  Result Value Ref Range   Troponin I (High Sensitivity) <2 <18 ng/L   DG Chest 2 View  Result Date: 11/24/2022 CLINICAL DATA:  Chest pain. EXAM: CHEST - 2 VIEW COMPARISON:  November 23, 2022. FINDINGS: The heart size and mediastinal contours are within normal limits. Both lungs are clear. The visualized skeletal structures are unremarkable. IMPRESSION: No active cardiopulmonary disease. Electronically Signed   By: Marijo Conception M.D.   On: 11/24/2022 08:10   DG Chest 2 View  Result Date: 11/23/2022 CLINICAL DATA:  Chest pain that radiates down left arm for 2 hours EXAM: CHEST - 2 VIEW COMPARISON:  X-ray 06/17/2022. Old CT scan 10/14/2021. Older exams as well. FINDINGS: The heart size and mediastinal contours are within normal limits. Both lungs are clear. The visualized skeletal structures are unremarkable. Surgical clips seen in the upper abdomen. IMPRESSION: No acute cardiopulmonary disease. Electronically Signed   By: Jill Side M.D.   On: 11/23/2022 18:33    EKG None  Radiology DG Chest 2 View  Result Date: 11/24/2022 CLINICAL DATA:  Chest pain. EXAM: CHEST - 2 VIEW COMPARISON:  November 23, 2022. FINDINGS: The heart size and mediastinal contours are within normal limits. Both lungs are clear. The visualized skeletal structures are unremarkable. IMPRESSION: No active cardiopulmonary disease. Electronically Signed   By: Marijo Conception M.D.   On: 11/24/2022 08:10   DG Chest 2 View  Result Date: 11/23/2022 CLINICAL DATA:  Chest pain that radiates down left arm for 2 hours EXAM: CHEST - 2 VIEW COMPARISON:  X-ray 06/17/2022. Old CT scan 10/14/2021. Older exams as well. FINDINGS: The heart size and  mediastinal contours are within normal limits. Both lungs are clear. The visualized skeletal structures are unremarkable. Surgical clips seen in the upper abdomen.  IMPRESSION: No acute cardiopulmonary disease. Electronically Signed   By: Jill Side M.D.   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 Decision-making details documented in ED Course. Radiology: ordered and independent interpretation performed. Decision-making details documented in ED Course. ECG/medicine tests: ordered and independent interpretation performed. Decision-making details documented in ED Course.  Risk Prescription drug management. Decision regarding hospitalization.   Iv ns. Continuous pulse ox and cardiac monitoring. Labs ordered/sent. Imaging ordered.   Diff dx includes acs, msk cp, gi cp, etc - dispo decision including potential need for admission considered - will get labs and imaging and reassess.   Reviewed nursing notes and prior charts for additional history. External reports reviewed. Additional history from:  Cardiac monitor: sinus rhythm, rate 77.  Labs reviewed/interpreted by me - trop normal. After constant symptoms for > 12 hours, felt not c/w acs.   Xrays reviewed/interpreted by me - no pna.   Ibuprofen po, ultram po, for symptom relief.  Pt comfortably, no acute distress.   Pt currently appears stable for d/c.  Return precautions provided.             Final Clinical Impression(s) / ED Diagnoses Final diagnoses:  None    Rx / DC Orders ED Discharge Orders     None         Lajean Saver, MD 11/24/22 [phone]

## 2022-12-20 ENCOUNTER — Emergency Department (HOSPITAL_COMMUNITY): Payer: Medicaid Other

## 2022-12-20 ENCOUNTER — Encounter (HOSPITAL_COMMUNITY): Payer: Self-pay

## 2022-12-20 ENCOUNTER — Other Ambulatory Visit: Payer: Self-pay

## 2022-12-20 ENCOUNTER — Emergency Department (HOSPITAL_COMMUNITY)
Admission: EM | Admit: 2022-12-20 | Discharge: 2022-12-20 | Discharge status: Against Medical Advice | Payer: Medicaid Other | Attending: Emergency Medicine | Admitting: Emergency Medicine

## 2022-12-20 DIAGNOSIS — R109 Unspecified abdominal pain: Secondary | ICD-10-CM | POA: Insufficient documentation

## 2022-12-20 DIAGNOSIS — R82998 Other abnormal findings in urine: Secondary | ICD-10-CM | POA: Diagnosis not present

## 2022-12-20 DIAGNOSIS — Z5321 Procedure and treatment not carried out due to patient leaving prior to being seen by health care provider: Secondary | ICD-10-CM | POA: Insufficient documentation

## 2022-12-20 NOTE — ED Triage Notes (Signed)
Pt reports left side flank pain with dark urine since this morning, hx of kidney stones.

## 2023-01-03 ENCOUNTER — Other Ambulatory Visit (HOSPITAL_COMMUNITY): Payer: Self-pay | Admitting: Physician Assistant

## 2023-01-03 ENCOUNTER — Ambulatory Visit (HOSPITAL_COMMUNITY)
Admission: RE | Admit: 2023-01-03 | Discharge: 2023-01-03 | Disposition: A | Discharge status: Home / Self Care | Payer: Medicaid Other | Source: Ambulatory Visit | Attending: Physician Assistant | Admitting: Physician Assistant

## 2023-01-03 DIAGNOSIS — R110 Nausea: Secondary | ICD-10-CM | POA: Diagnosis present

## 2023-01-03 DIAGNOSIS — R1011 Right upper quadrant pain: Secondary | ICD-10-CM

## 2023-01-11 ENCOUNTER — Encounter (HOSPITAL_COMMUNITY): Payer: Self-pay | Admitting: Emergency Medicine

## 2023-01-11 ENCOUNTER — Emergency Department (HOSPITAL_COMMUNITY)
Admission: EM | Admit: 2023-01-11 | Discharge: 2023-01-11 | Disposition: A | Discharge status: Home / Self Care | Payer: Medicaid Other | Attending: Emergency Medicine | Admitting: Emergency Medicine

## 2023-01-11 ENCOUNTER — Emergency Department (HOSPITAL_COMMUNITY): Payer: Medicaid Other

## 2023-01-11 ENCOUNTER — Other Ambulatory Visit: Payer: Self-pay

## 2023-01-11 DIAGNOSIS — Z7984 Long term (current) use of oral hypoglycemic drugs: Secondary | ICD-10-CM | POA: Diagnosis not present

## 2023-01-11 DIAGNOSIS — Z794 Long term (current) use of insulin: Secondary | ICD-10-CM | POA: Insufficient documentation

## 2023-01-11 DIAGNOSIS — R1013 Epigastric pain: Secondary | ICD-10-CM | POA: Diagnosis not present

## 2023-01-11 DIAGNOSIS — R739 Hyperglycemia, unspecified: Secondary | ICD-10-CM

## 2023-01-11 DIAGNOSIS — E1165 Type 2 diabetes mellitus with hyperglycemia: Secondary | ICD-10-CM | POA: Diagnosis not present

## 2023-01-11 LAB — COMPREHENSIVE METABOLIC PANEL
ALT: 26 U/L (ref 0–44)
AST: 31 U/L (ref 15–41)
Albumin: 3.7 g/dL (ref 3.5–5.0)
Alkaline Phosphatase: 100 U/L (ref 38–126)
Anion gap: 16 — ABNORMAL HIGH (ref 5–15)
BUN: 15 mg/dL (ref 6–20)
CO2: 21 mmol/L — ABNORMAL LOW (ref 22–32)
Calcium: 9.3 mg/dL (ref 8.9–10.3)
Chloride: 95 mmol/L — ABNORMAL LOW (ref 98–111)
Creatinine, Ser: 1.18 mg/dL — ABNORMAL HIGH (ref 0.44–1.00)
GFR, Estimated: 59 mL/min — ABNORMAL LOW (ref >60)
Glucose, Bld: 444 mg/dL — ABNORMAL HIGH (ref 70–99)
Potassium: 3.9 mmol/L (ref 3.5–5.1)
Sodium: 132 mmol/L — ABNORMAL LOW (ref 135–145)
Total Bilirubin: 0.5 mg/dL (ref 0.3–1.2)
Total Protein: 7.3 g/dL (ref 6.5–8.1)

## 2023-01-11 LAB — LIPASE, BLOOD: Lipase: 93 U/L — ABNORMAL HIGH (ref 11–51)

## 2023-01-11 LAB — CBC WITH DIFFERENTIAL/PLATELET
Abs Immature Granulocytes: 0.07 10*3/uL (ref 0.00–0.07)
Basophils Absolute: 0.2 10*3/uL — ABNORMAL HIGH (ref 0.0–0.1)
Basophils Relative: 1 %
Eosinophils Absolute: 0.6 10*3/uL — ABNORMAL HIGH (ref 0.0–0.5)
Eosinophils Relative: 4 %
HCT: 45.8 % (ref 36.0–46.0)
Hemoglobin: 15.3 g/dL — ABNORMAL HIGH (ref 12.0–15.0)
Immature Granulocytes: 0 %
Lymphocytes Relative: 22 %
Lymphs Abs: 3.5 10*3/uL (ref 0.7–4.0)
MCH: 27.4 pg (ref 26.0–34.0)
MCHC: 33.4 g/dL (ref 30.0–36.0)
MCV: 82.1 fL (ref 80.0–100.0)
Monocytes Absolute: 1.2 10*3/uL — ABNORMAL HIGH (ref 0.1–1.0)
Monocytes Relative: 8 %
Neutro Abs: 10.3 10*3/uL — ABNORMAL HIGH (ref 1.7–7.7)
Neutrophils Relative %: 65 %
Platelets: 261 10*3/uL (ref 150–400)
RBC: 5.58 MIL/uL — ABNORMAL HIGH (ref 3.87–5.11)
RDW: 14.4 % (ref 11.5–15.5)
WBC: 15.7 10*3/uL — ABNORMAL HIGH (ref 4.0–10.5)
nRBC: 0 % (ref 0.0–0.2)

## 2023-01-11 LAB — HCG, QUANTITATIVE, PREGNANCY: hCG, Beta Chain, Quant, S: <1 m[IU]/mL (ref <5)

## 2023-01-11 LAB — CBG MONITORING, ED: Glucose-Capillary: 310 mg/dL — ABNORMAL HIGH (ref 70–99)

## 2023-01-11 MED ORDER — IOHEXOL 300 MG/ML  SOLN
100.0000 mL | Freq: Once | INTRAMUSCULAR | Status: AC | PRN
  Administered 2023-01-11: 100 mL via INTRAVENOUS

## 2023-01-11 MED ORDER — FAMOTIDINE 20 MG PO TABS
20.0000 mg | ORAL_TABLET | Freq: Once | ORAL | Status: AC
  Administered 2023-01-11: 20 mg via ORAL
  Filled 2023-01-11: qty 1

## 2023-01-11 MED ORDER — LACTATED RINGERS IV BOLUS
1000.0000 mL | Freq: Once | INTRAVENOUS | Status: AC
  Administered 2023-01-11: 1000 mL via INTRAVENOUS

## 2023-01-11 MED ORDER — INSULIN ASPART 100 UNIT/ML IV SOLN
5.0000 [IU] | Freq: Once | INTRAVENOUS | Status: AC
  Administered 2023-01-11: 5 [IU] via INTRAVENOUS

## 2023-01-11 MED ORDER — SUCRALFATE 1 GM/10ML PO SUSP: 1.0000 g | Freq: Three times a day (TID) | ORAL | 0 refills | Status: AC

## 2023-01-11 MED ORDER — DICYCLOMINE HCL 20 MG PO TABS: 20.0000 mg | ORAL_TABLET | Freq: Two times a day (BID) | ORAL | 0 refills | Status: AC

## 2023-01-11 MED ORDER — ALUM & MAG HYDROXIDE-SIMETH 200-200-20 MG/5ML PO SUSP
30.0000 mL | Freq: Once | ORAL | Status: AC
  Administered 2023-01-11: 30 mL via ORAL
  Filled 2023-01-11: qty 30

## 2023-01-11 MED ORDER — PANTOPRAZOLE SODIUM 40 MG IV SOLR
40.0000 mg | Freq: Once | INTRAVENOUS | Status: AC
  Administered 2023-01-11: 40 mg via INTRAVENOUS
  Filled 2023-01-11: qty 10

## 2023-01-11 MED ORDER — FAMOTIDINE 20 MG PO TABS: 20.0000 mg | ORAL_TABLET | Freq: Two times a day (BID) | ORAL | 0 refills | Status: AC

## 2023-01-11 NOTE — ED Triage Notes (Addendum)
Pt with L upper abdominal pain and bloating. States she was seen for same by PCP on the 14th of this month and sent here for an [place] that same day. Pt states she is supposed to follow-up with [facility] on the 4th of next month.

## 2023-01-12 NOTE — ED Provider Notes (Signed)
[facility] Provider Note   CSN: [id number] Arrival date & time: 01/11/23  [id number]     History  Chief Complaint  Patient presents with   Abdominal Pain    Isabella Buchanan is a 42 y.o. female.  Ruq abdominal pain. History of same being seen by outpatient doctors. Tonight with nausea, bloating. About an hour after eating. Called nurse line and they said to get evaluated within four hours. No fevers or chills.    Abdominal Pain      Home Medications Prior to Admission medications   Medication Sig Start Date End Date Taking? Authorizing Provider  famotidine (PEPCID) 20 MG tablet Take 1 tablet (20 mg total) by mouth 2 (two) times daily. 01/11/23  Yes Hannalee Castor, Corene Cornea, MD  sucralfate (CARAFATE) 1 GM/10ML suspension Take 10 mLs (1 g total) by mouth 4 (four) times daily -  with meals and at bedtime. 01/11/23  Yes Lenise Jr, Corene Cornea, MD  acetaminophen (TYLENOL) 325 MG tablet Take 2 tablets (650 mg total) by mouth every 6 (six) hours as needed for mild pain or fever. 11/19/19   Roxan Hockey, MD  albuterol (VENTOLIN HFA) 108 (90 Base) MCG/ACT inhaler Inhale 2 puffs into the lungs every 4 (four) hours as needed for wheezing or shortness of breath. 01/08/22   Orpah Greek, MD  atorvastatin (LIPITOR) 40 MG tablet Take by mouth. 07/15/20   [provider]  benzonatate (TESSALON) 100 MG capsule Take 1 capsule (100 mg total) by mouth every 8 (eight) hours. 06/17/22   Noemi Chapel, MD  clonazePAM (KLONOPIN) 1 MG tablet Take 1 mg by mouth 2 (two) times daily as needed for anxiety. 06/24/21   [provider]  dapagliflozin propanediol (FARXIGA) 10 MG TABS tablet Take 1 tablet by mouth daily. 06/19/21   [provider]  dicyclomine (BENTYL) 20 MG tablet Take 1 tablet (20 mg total) by mouth 2 (two) times daily. 01/11/23   Caterine Mcmeans, Corene Cornea, MD  esomeprazole (NEXIUM) 40 MG capsule Take 1 capsule (40 mg total) by mouth daily with breakfast.  11/19/19   Denton Brick, Courage, MD  fenofibrate (TRICOR) 145 MG tablet Take 1 tablet by mouth daily. 06/20/21   [provider]  fluconazole (DIFLUCAN) 100 MG tablet Take 100 mg by mouth daily. 09/30/21   [provider]  gabapentin (NEURONTIN) 600 MG tablet Take 600 mg by mouth 4 (four) times daily. 07/14/21   [provider]  ibuprofen (ADVIL) 200 MG tablet Take by mouth.    [provider]  loratadine-pseudoephedrine (CLARITIN-D 24 HOUR) 10-240 MG 24 hr tablet Take 1 tablet by mouth daily. 01/08/22   Orpah Greek, MD  metFORMIN (GLUCOPHAGE-XR) 500 MG 24 hr tablet Take 1 tablet (500 mg total) by mouth daily with breakfast. 11/19/19   Emokpae, Courage, MD  mometasone-formoterol (DULERA) 200-5 MCG/ACT AERO Inhale 2 puffs into the lungs 2 (two) times daily. 07/02/19   Tanda Rockers, MD  ondansetron (ZOFRAN) 4 MG tablet Take 1 tablet (4 mg total) by mouth every 6 (six) hours as needed for nausea or vomiting. 10/12/17   Forde Dandy, MD  pramipexole (MIRAPEX) 1 MG tablet Take 1.5 mg by mouth at bedtime.     [provider]  valsartan (DIOVAN) 80 MG tablet Take 80 mg by mouth daily.    [provider]      Allergies    Omnicef [cefdinir], Amoxicillin, Celebrex [celecoxib], Codeine, Penicillins, and Sulfa antibiotics    Review of  Systems   Review of Systems  Gastrointestinal:  Positive for abdominal pain.    Physical Exam Updated Vital Signs BP 104/68   Pulse 81   Temp 98.6 F (37 C) (Oral)   Resp 18   Ht '5\' 4"'$  (1.626 m)   Wt 84 kg   LMP 12/18/2022 (Exact Date)   SpO2 95%   BMI 31.79 kg/m  Physical Exam Vitals and nursing note reviewed.  Constitutional:      Appearance: She is well-developed.  HENT:     Head: Normocephalic and atraumatic.  Cardiovascular:     Rate and Rhythm: Normal rate and regular rhythm.  Pulmonary:     Effort: No respiratory distress.     Breath sounds: No stridor.  Abdominal:     General: There  is no distension.     Tenderness: There is abdominal tenderness in the right upper quadrant and epigastric area.  Musculoskeletal:     Cervical back: Normal range of motion.  Neurological:     Mental Status: She is alert.     ED Results / Procedures / Treatments   Labs (all labs ordered are listed, but only abnormal results are displayed) Labs Reviewed  CBC WITH DIFFERENTIAL/PLATELET - Abnormal; Notable for the following components:      Result Value   WBC 15.7 (*)    RBC 5.58 (*)    Hemoglobin 15.3 (*)    Neutro Abs 10.3 (*)    Monocytes Absolute 1.2 (*)    Eosinophils Absolute 0.6 (*)    Basophils Absolute 0.2 (*)    All other components within normal limits  COMPREHENSIVE METABOLIC PANEL - Abnormal; Notable for the following components:   Sodium 132 (*)    Chloride 95 (*)    CO2 21 (*)    Glucose, Bld 444 (*)    Creatinine, Ser 1.18 (*)    GFR, Estimated 59 (*)    Anion gap 16 (*)    All other components within normal limits  LIPASE, BLOOD - Abnormal; Notable for the following components:   Lipase 93 (*)    All other components within normal limits  CBG MONITORING, ED - Abnormal; Notable for the following components:   Glucose-Capillary 310 (*)    All other components within normal limits  HCG, QUANTITATIVE, PREGNANCY    EKG None  Radiology CT ABDOMEN PELVIS W CONTRAST  Result Date: 01/11/2023 CLINICAL DATA:  Abdominal pain, acute, nonlocalized. Pt with L upper abdominal pain and bloating. States she was seen for same by PCP on the 14th of this month and sent here for an [place] that same day. Pt states she is supposed to follow-up with [facility] on the 4th of next mont EXAM: CT ABDOMEN AND PELVIS WITH CONTRAST TECHNIQUE: Multidetector CT imaging of the abdomen and pelvis was performed using the standard protocol following bolus administration of intravenous contrast. RADIATION DOSE REDUCTION: This exam was performed according to the departmental dose-optimization  program which includes automated exposure control, adjustment of the mA and/or kV according to patient size and/or use of iterative reconstruction technique. CONTRAST:  176m OMNIPAQUE IOHEXOL 300 MG/ML  SOLN COMPARISON:  None Available. FINDINGS: Lower chest: No acute abnormality. Hepatobiliary: Liver is enlarged measuring up to 26 cm. The hepatic parenchyma is diffusely hypodense compared to the splenic parenchyma consistent with fatty infiltration. No focal liver abnormality. Status post cholecystectomy. No biliary dilatation. Pancreas: No focal lesion. Normal pancreatic contour. No surrounding inflammatory changes. No main pancreatic ductal dilatation. SPleen: The spleen  is enlarged measuring up to 13 cm. No splenic lesion. Adrenals/Urinary Tract: No adrenal nodule bilaterally. Bilateral kidneys enhance symmetrically. Couple 1-2 mm left nephrolithiasis. No right nephrolithiasis. No ureterolithiasis. No hydronephrosis. No hydroureter. The urinary bladder is unremarkable. Stomach/Bowel: Stomach is within normal limits. Fecalized material within the distal small bowel lumen likely due to slow transition state versus incompetent ileocecal valve. No evidence of bowel wall thickening or dilatation. Appendix appears normal. Vascular/Lymphatic: No abdominal aorta or iliac aneurysm. Mild to moderate atherosclerotic plaque of the aorta and its branches. No abdominal, pelvic, or inguinal lymphadenopathy. Reproductive: Uterus and bilateral adnexa are unremarkable. Other: No intraperitoneal free fluid. No intraperitoneal free gas. No organized fluid collection. Musculoskeletal: No abdominal wall hernia or abnormality. No suspicious lytic or blastic osseous lesions. No acute displaced fracture. Multilevel degenerative changes of the spine. IMPRESSION: 1. Marked hepatomegaly and hepatic steatosis. 2. Mild splenomegaly. 3. Nonobstructive 1-78m left nephrolithiasis. 4. Fecalized material within the distal small bowel lumen  likely due to slow transition state versus incompetent ileocecal valve. 5. Status post cholecystectomy. 6.  Aortic Atherosclerosis (ICD10-I70.0). Electronically Signed   By: MIven FinnM.D.   On: 01/11/2023 03:27   DG Abdomen Acute W/Chest  Result Date: 01/11/2023 CLINICAL DATA:  Bloating. EXAM: DG ABDOMEN ACUTE WITH 1 VIEW CHEST COMPARISON:  Chest radiograph dated 11/24/2022. FINDINGS: Mild diffuse interstitial prominence. No focal consolidation, pleural effusion, pneumothorax. The cardiac silhouette is within normal limits. Moderate stool throughout the colon. No bowel dilatation or evidence of obstruction. No free air or radiopaque calculi. The osseous structures are intact. Right upper quadrant cholecystectomy clips. IMPRESSION: 1. No acute cardiopulmonary process. 2. Moderate stool burden. No bowel obstruction. Electronically Signed   By: AAnner CreteM.D.   On: 01/11/2023 02:41    Procedures Procedures    Medications Ordered in ED Medications  pantoprazole (PROTONIX) injection 40 mg (40 mg Intravenous Given 01/11/23 0140)  lactated ringers bolus 1,000 mL (0 mLs Intravenous Stopped 01/11/23 0228)  famotidine (PEPCID) tablet 20 mg (20 mg Oral Given 01/11/23 0138)  alum & mag hydroxide-simeth (MAALOX/MYLANTA) 200-200-20 MG/5ML suspension 30 mL (30 mLs Oral Given 01/11/23 0138)  lactated ringers bolus 1,000 mL (0 mLs Intravenous Stopped 01/11/23 0437)  insulin aspart (novoLOG) injection 5 Units (5 Units Intravenous Given 01/11/23 0315)  iohexol (OMNIPAQUE) 300 MG/ML solution 100 mL (100 mLs Intravenous Contrast Given 01/11/23 0247)    ED Course/ Medical Decision Making/ A&P                             Medical Decision Making Amount and/or Complexity of Data Reviewed Labs: ordered. Radiology: ordered.  Risk OTC drugs. Prescription drug management.   Abdominal pain with elevated wbc and lipase - ct done to eval for stones/pancreatitis/ulcer or other causes for symptoms. No  obvious biliary issues on my interpretation. Radiology notes hepatomegaly and splenomegaly. These are already known and she has follow up for them. Suspect her symptoms tonight to be from gastritis or something on that spectrum. Will treat accordingly.   Hyperglycemia - h/o DM and high glucoses. Improved with fluids and insulin. Has mild anion gap but no other signs of DKA. Will continue to monitor and work on treatments with PCP.   Final Clinical Impression(s) / ED Diagnoses Final diagnoses:  Epigastric pain  Hyperglycemia    Rx / DC Orders ED Discharge Orders          Ordered    dicyclomine (BENTYL) 20  MG tablet  2 times daily        01/11/23 0502    famotidine (PEPCID) 20 MG tablet  2 times daily        01/11/23 0502    sucralfate (CARAFATE) 1 GM/10ML suspension  3 times daily with meals & bedtime        01/11/23 0502              Larsen Zettel, Corene Cornea, MD 01/12/23 [id number]

## 2023-05-16 DIAGNOSIS — I10 Essential (primary) hypertension: Secondary | ICD-10-CM | POA: Diagnosis not present

## 2023-05-16 DIAGNOSIS — E1165 Type 2 diabetes mellitus with hyperglycemia: Secondary | ICD-10-CM | POA: Diagnosis not present

## 2023-05-16 DIAGNOSIS — E785 Hyperlipidemia, unspecified: Secondary | ICD-10-CM | POA: Diagnosis not present

## 2023-05-16 DIAGNOSIS — E1169 Type 2 diabetes mellitus with other specified complication: Secondary | ICD-10-CM | POA: Diagnosis not present

## 2023-06-12 DIAGNOSIS — Z794 Long term (current) use of insulin: Secondary | ICD-10-CM | POA: Diagnosis not present

## 2023-06-12 DIAGNOSIS — E1165 Type 2 diabetes mellitus with hyperglycemia: Secondary | ICD-10-CM | POA: Diagnosis not present

## 2023-09-21 DIAGNOSIS — R634 Abnormal weight loss: Secondary | ICD-10-CM | POA: Diagnosis not present

## 2023-09-21 DIAGNOSIS — Z124 Encounter for screening for malignant neoplasm of cervix: Secondary | ICD-10-CM | POA: Diagnosis not present

## 2023-09-21 DIAGNOSIS — E785 Hyperlipidemia, unspecified: Secondary | ICD-10-CM | POA: Diagnosis not present

## 2023-09-21 DIAGNOSIS — E1169 Type 2 diabetes mellitus with other specified complication: Secondary | ICD-10-CM | POA: Diagnosis not present

## 2023-09-21 DIAGNOSIS — Z7721 Contact with and (suspected) exposure to potentially hazardous body fluids: Secondary | ICD-10-CM | POA: Diagnosis not present

## 2023-12-28 NOTE — Progress Notes (Signed)
 Subjective   Patient ID:  Isabella Buchanan is a 43 y.o. (DOB 1980/12/02) female.   Chief Complaint  Patient presents with  . bleeding after intercourse     HPI  Pt presents c/o bleeding with intercourse over the last month. Was spotting, but in the last month, she is bleeding consistently. Denies any dyspareunia. Some cramping. Was having menses every 1-2 months prior to this. Pt has PCOS, but has not had any issues with this in the last several years. No known FHx of fibroids. Pap in November was normal with negative STD testing and negative HPV. Same partner. Pt has to reschedule her appt with endo as she missed her recent appt. Glucoses have been in the 120-130 range.  Problem List, Past Medical History, Past Surgical History, Past Family History, Social History, Medications, and Allergies, were reviewed and updated as appropriate.   Review of Systems Review of Systems  Constitutional:  Negative for chills, fatigue and fever.  Respiratory:  Negative for cough, chest tightness and shortness of breath.   Cardiovascular:  Negative for chest pain, palpitations and leg swelling.  Gastrointestinal:  Negative for abdominal pain, blood in stool, diarrhea, nausea and vomiting.  Genitourinary:  Positive for vaginal bleeding. Negative for dyspareunia, dysuria and hematuria.  Skin:  Negative for rash.  Neurological:  Negative for dizziness, syncope, weakness, light-headedness and headaches.     Objective   BP 122/84 (BP Location: Left Upper Arm, Patient Position: Sitting)   Pulse 77   Temp 98.2 F (36.8 C) (Oral)   Resp 20   Ht 5' 3 (1.6 m)   Wt 163 lb 3.2 oz (74 kg)   LMP  (LMP Unknown) Comment: possibly early January 2025, has been bleeding off and on x 1 month  SpO2 96%   Breastfeeding No   BMI 28.91 kg/m   Physical Exam Vitals and nursing note reviewed.  Constitutional:      General: She is not in acute distress.    Appearance: Normal appearance. She is  well-developed. She is not ill-appearing or toxic-appearing.  HENT:     Head: Normocephalic and atraumatic.     Mouth/Throat:     Lips: Pink.     Mouth: Mucous membranes are moist.     Pharynx: Oropharynx is clear.  Cardiovascular:     Rate and Rhythm: Normal rate and regular rhythm.     Heart sounds: Normal heart sounds, S1 normal and S2 normal. No murmur heard.    No friction rub. No gallop.  Musculoskeletal:     Cervical back: Neck supple.  Pulmonary:     Effort: Pulmonary effort is normal.     Breath sounds: Normal breath sounds. No decreased breath sounds, wheezing, rhonchi or rales.  Abdominal:     General: Abdomen is flat.     Palpations: Abdomen is soft. There is no mass.     Tenderness: There is abdominal tenderness in the suprapubic area. There is no right CVA tenderness, left CVA tenderness, guarding or rebound.  Genitourinary:    Comments: Exam deferred. Skin:    General: Skin is warm and dry.     Findings: No rash.  Neurological:     General: No focal deficit present.     Mental Status: She is alert and oriented to person, place, and time.  Psychiatric:        Mood and Affect: Mood normal.        Behavior: Behavior normal.        Thought  Content: Thought content normal.        Judgment: Judgment normal.     --------------------------- Labs last 12 hours No results found for this or any previous visit (from the past 12 hour(s)).    Impression   1. Dysfunctional uterine bleeding  CBC And Differential   US  Pelvis Transabdominal Transvaginal Without Doppler   norethindrone (ORTHO MICRONOR,CAMILA,NORA-BE,ERRIN,JOLIVETTE,HEATHER,SHAROBEL) 0.35 MG tablet   CBC And Differential    2. Type 2 diabetes mellitus with hyperglycemia, without long-term current use of insulin    Hemoglobin A1c   Comprehensive Metabolic Panel   Albumin/Creatinine Ratio, Random Urine   Albumin/Creatinine Ratio, Random Urine   Comprehensive Metabolic Panel   Hemoglobin A1c       Plan   Reassurance.  DUB: Discussed possible causes of this including fibroids, endometrial issues, etc. Recent pap was normal. Will check CBC and get pt set up for pelvic US . Start pt on progesterone-only BCPs to see if we can stop the bleeding - she is aware that she cannot take estrogen-containing pills due to her smoking and increased risk of clots. Take around the same time every day. Advised that this does not protect against STDs. Discussed that we may need to refer to GYN as well. Increase fluids.  DM TYPE 2: Check A1C, CMP and microalbumin and send to endo. Continue metformin  xr 2000mg  and farxiga 10mg  daily as well as as insulin  - will need to determine which insulin  she is using and what dose as this was not reconciled.  See encounter after visit summary for patient specific instructions.  I have reviewed the information contained in this note and personally verified its accuracy.  I obtained the history of present illness and personally performed the physical exam.  Patient verbalized to me that they understood what their problem is, what they need to do about it and why it is important that they do it.  No follow-ups on file.    Orders Placed This Encounter  Procedures  . US  Pelvis Transabdominal Transvaginal Without Doppler  . CBC And Differential  . Hemoglobin A1c  . Comprehensive Metabolic Panel  . Albumin/Creatinine Ratio, Random Urine     Patient's Medications       * Accurate as of December 28, 2023 12:17 PM. Reflects encounter med changes as of last refresh          New Prescriptions      Instructions  norethindrone 0.35 MG tablet Commonly known as: ORTHO MICRONOR,CAMILA,NORA-BE,ERRIN,JOLIVETTE,HEATHER,SHAROBEL Started by: Tylene Meres  0.35 mg, Oral, Daily       Continued Medications      Instructions  ACCU-CHEK AVIVA PLUS test strip Generic drug: glucose blood  Use to check glucoses up to twice daily.   ACCU-CHEK AVIVA PLUS w/Device Kit  1  box., Does not apply, Daily, Use to check fasting glucose daily.   ACCU-CHEK FASTCLIX LANCETS Misc  Use to monitor blood glucose 2 time(s) daily   adapalene 0.1% gel Commonly known as: DIFFERIN  Topical, At bedtime   alosetron 0.5 mg tablet Commonly known as: LOTRONEX  0.5 mg, Oral, 2 times a day   clonazePAM  1 mg tablet Commonly known as: KLONOPIN   TAKE 1 TABLET BY MOUTH TWICE A DAY AS NEEDED FOR ANXIETY, USE SPARINGLY FOR UP TO 30 DAYS   dapagliflozin 10 mg tablet Commonly known as: FARXIGA  10 mg, Oral, Daily   dexlansoprazole 60 mg capsule Commonly known as: DEXILANT  60 mg, Oral, Daily   ergocalciferol 50,000 units Caps capsule  Commonly known as: Vitamin D2  Take 1 capsule twice weekly.   fenofibrate  145 MG tablet  145 mg, Oral, Daily   fluconazole  150 mg tablet Commonly known as: DIFLUCAN   TAKE ONE TABLET EVERY THIRD DAY.   HYDROcodone -acetaminophen  5-325 mg per tablet Commonly known as: NORCO  1 tablet, Oral, Every 8 hours as needed, Use as needed for severe pain only. Do not take with clonazepam .   LANTUS SOLOSTAR 100 UNIT/ML Sopn  Subcutaneous, Daily   metFORMIN  ER 500 mg 24 hr tablet Commonly known as: GLUCOPHAGE -XR  2,000 mg, Oral, With supper   ondansetron  8 mg disintegrating tablet Commonly known as: ZOFRAN -ODT  DIZZOLVE 1 TABLET ON THE TONGUE TWICE A DAY AS NEEDED FOR NAUSEA   pramipexole  dihydrochloride 1 mg tablet Commonly known as: MIRAPEX   TAKE 1 AND A HALF TABLETS BY MOUTH AT BEDTIME   pregabalin  150 mg capsule Commonly known as: LYRICA   TAKE ONE CAPSULE 3 (THREE) TIMES A DAY. MAX DAILY AMOUNT: 450 MG   promethazine  25 MG tablet Commonly known as: PHENERGAN   TAKE 1 TABLET BY MOUTH EVERY 4 TO 6 HOURS AS NEEDED FOR NAUSEA AND VOMITING   rosuvastatin calcium  10 mg tablet Commonly known as: CRESTOR  10 mg, Oral, Daily   sucralfate  1 g/10 mL oral suspension Commonly known as: CARAFATE   1 g, Oral   TRESIBA FLEXTOUCH 100 UNIT/ML  injection Generic drug: Insulin  Degludec  Subcutaneous   valacyclovir 1000 mg tablet Commonly known as: VALTREX  Take 2 tablets at onset of symptoms and repeat 2 tabs in 12 hours. Use prn.        Risks, benefits, and alternatives of the medications and treatment plan prescribed today were discussed, and patient expressed understanding. Plan follow-up as discussed or as needed if any worsening symptoms or change in condition.    A yearly health maintenance exam was recommended where appropriate.     *Some images could not be shown.

## 2024-07-12 ENCOUNTER — Emergency Department (HOSPITAL_COMMUNITY): Payer: Self-pay

## 2024-07-12 ENCOUNTER — Emergency Department (HOSPITAL_COMMUNITY)
Admission: EM | Admit: 2024-07-12 | Discharge: 2024-07-12 | Disposition: A | Discharge status: Home / Self Care | Payer: Self-pay

## 2024-07-12 ENCOUNTER — Other Ambulatory Visit: Payer: Self-pay

## 2024-07-12 DIAGNOSIS — X500XXA Overexertion from strenuous movement or load, initial encounter: Secondary | ICD-10-CM | POA: Insufficient documentation

## 2024-07-12 DIAGNOSIS — M545 Low back pain, unspecified: Secondary | ICD-10-CM | POA: Insufficient documentation

## 2024-07-12 DIAGNOSIS — E119 Type 2 diabetes mellitus without complications: Secondary | ICD-10-CM | POA: Insufficient documentation

## 2024-07-12 LAB — PREGNANCY, URINE: Preg Test, Ur: NEGATIVE

## 2024-07-12 MED ORDER — PREDNISONE 20 MG PO TABS
40.0000 mg | ORAL_TABLET | Freq: Every day | ORAL | 0 refills | Status: AC
Start: 2024-07-12 — End: ?

## 2024-07-12 MED ORDER — KETOROLAC TROMETHAMINE 15 MG/ML IJ SOLN
15.0000 mg | Freq: Once | INTRAMUSCULAR | Status: DC
Start: 2024-07-12 — End: 2024-07-12
  Filled 2024-07-12: qty 1

## 2024-07-12 MED ORDER — HYDROCODONE-ACETAMINOPHEN 5-325 MG PO TABS
1.0000 | ORAL_TABLET | Freq: Once | ORAL | Status: AC
  Administered 2024-07-12: 1 via ORAL
  Filled 2024-07-12: qty 1

## 2024-07-12 MED ORDER — METHOCARBAMOL 500 MG PO TABS
1000.0000 mg | ORAL_TABLET | Freq: Once | ORAL | Status: AC
  Administered 2024-07-12: 1000 mg via ORAL
  Filled 2024-07-12: qty 2

## 2024-07-12 MED ORDER — LIDOCAINE 5 % EX PTCH
1.0000 | MEDICATED_PATCH | CUTANEOUS | Status: DC
  Administered 2024-07-12: 1 via TRANSDERMAL
  Filled 2024-07-12: qty 1

## 2024-07-12 MED ORDER — LIDOCAINE 5 % EX PTCH: 1.0000 | MEDICATED_PATCH | CUTANEOUS | 0 refills | Status: AC

## 2024-07-12 NOTE — ED Notes (Signed)
 Pt leaving stating her ride is here. Pt refused her medication. Discharge paperwork reviewed with patient at this time.

## 2024-07-12 NOTE — ED Provider Notes (Signed)
 [facility] AT [facility] Provider Note   CSN: [id number] Arrival date & time: 07/12/24  1355     Patient presents with: No chief complaint on file.   Isabella Buchanan is a 43 y.o. female patient with past medical history of fibromyalgia, restless leg syndrome, chronic back pain due to degenerative disc disease, uncomplicated type 2 diabetes presenting to emergency room with complaint of 2 days of low back pain she locates over her sacrum.  Reports this started after lifting heavy objects repetitively.  Pain is fine when lying down flat but worse with flexion at hips/standing up. No rash or fever.  No loss of bowel or bladder.  No saddle anesthesia.  No history of fever, IV drug use or cancer.  No radicular symptoms.   HPI     Prior to Admission medications   Medication Sig Start Date End Date Taking? Authorizing Provider  acetaminophen  (TYLENOL ) 325 MG tablet Take 2 tablets (650 mg total) by mouth every 6 (six) hours as needed for mild pain or fever. 11/19/19   Pearlean Manus, MD  albuterol  (VENTOLIN  HFA) 108 (90 Base) MCG/ACT inhaler Inhale 2 puffs into the lungs every 4 (four) hours as needed for wheezing or shortness of breath. 01/08/22   Haze Lonni PARAS, MD  atorvastatin  (LIPITOR) 40 MG tablet Take by mouth. 07/15/20   [provider]  benzonatate  (TESSALON ) 100 MG capsule Take 1 capsule (100 mg total) by mouth every 8 (eight) hours. 06/17/22   Cleotilde Rogue, MD  clonazePAM  (KLONOPIN ) 1 MG tablet Take 1 mg by mouth 2 (two) times daily as needed for anxiety. 06/24/21   [provider]  dapagliflozin propanediol (FARXIGA) 10 MG TABS tablet Take 1 tablet by mouth daily. 06/19/21   [provider]  dicyclomine  (BENTYL ) 20 MG tablet Take 1 tablet (20 mg total) by mouth 2 (two) times daily. 01/11/23   Mesner, Selinda, MD  esomeprazole  (NEXIUM ) 40 MG capsule Take 1 capsule (40 mg total) by mouth daily with breakfast. 11/19/19    Pearlean, Courage, MD  famotidine  (PEPCID ) 20 MG tablet Take 1 tablet (20 mg total) by mouth 2 (two) times daily. 01/11/23   Mesner, Selinda, MD  fenofibrate  (TRICOR ) 145 MG tablet Take 1 tablet by mouth daily. 06/20/21   [provider]  fluconazole  (DIFLUCAN ) 100 MG tablet Take 100 mg by mouth daily. 09/30/21   [provider]  gabapentin  (NEURONTIN ) 600 MG tablet Take 600 mg by mouth 4 (four) times daily. 07/14/21   [provider]  ibuprofen  (ADVIL ) 200 MG tablet Take by mouth.    [provider]  loratadine -pseudoephedrine  (CLARITIN -D 24 HOUR) 10-240 MG 24 hr tablet Take 1 tablet by mouth daily. 01/08/22   Haze Lonni PARAS, MD  metFORMIN  (GLUCOPHAGE -XR) 500 MG 24 hr tablet Take 1 tablet (500 mg total) by mouth daily with breakfast. 11/19/19   Pearlean Manus, MD  mometasone -formoterol  (DULERA ) 200-5 MCG/ACT AERO Inhale 2 puffs into the lungs 2 (two) times daily. 07/02/19   Darlean Ozell NOVAK, MD  ondansetron  (ZOFRAN ) 4 MG tablet Take 1 tablet (4 mg total) by mouth every 6 (six) hours as needed for nausea or vomiting. 10/12/17   Liu, Dana Duo, MD  pramipexole  (MIRAPEX ) 1 MG tablet Take 1.5 mg by mouth at bedtime.     [provider]  sucralfate  (CARAFATE ) 1 GM/10ML suspension Take 10 mLs (1 g total) by mouth 4 (four) times daily -  with meals and at bedtime. 01/11/23  Mesner, Selinda, MD  valsartan (DIOVAN) 80 MG tablet Take 80 mg by mouth daily.    [provider]    Allergies: Omnicef [cefdinir], Amoxicillin, Celebrex  [celecoxib ], Codeine, Penicillins, and Sulfa antibiotics    Review of Systems  Musculoskeletal:  Positive for back pain.    Updated Vital Signs There were no vitals taken for this visit.  Physical Exam Vitals and nursing note reviewed.  Constitutional:      General: She is not in acute distress.    Appearance: She is not toxic-appearing.  HENT:     Head: Normocephalic and atraumatic.  Eyes:     General: No scleral  icterus.    Conjunctiva/sclera: Conjunctivae normal.  Cardiovascular:     Rate and Rhythm: Normal rate and regular rhythm.     Pulses: Normal pulses.     Heart sounds: Normal heart sounds.  Pulmonary:     Effort: Pulmonary effort is normal. No respiratory distress.     Breath sounds: Normal breath sounds.  Abdominal:     General: Abdomen is flat. Bowel sounds are normal.     Palpations: Abdomen is soft.     Tenderness: There is no abdominal tenderness.  Musculoskeletal:       Arms:     Right lower leg: No edema.     Left lower leg: No edema.     Comments: No midline tenderness over spine, no step off. No rash/abscess. Strength and sensation of lower ext intact.   Skin:    General: Skin is warm and dry.     Findings: No lesion.  Neurological:     General: No focal deficit present.     Mental Status: She is alert and oriented to person, place, and time. Mental status is at baseline.     (all labs ordered are listed, but only abnormal results are displayed) Labs Reviewed - No data to display  EKG: None  Radiology: DG Sacrum/Coccyx Result Date: 07/12/2024 CLINICAL DATA:  Sacral pain that started yesterday. EXAM: SACRUM AND COCCYX - 2+ VIEW COMPARISON:  03/10/2014. FINDINGS: There is no evidence of fracture or other focal bone lesions. Facet arthropathy is present in the lower lumbar spine. IMPRESSION: No acute fracture. Electronically Signed   By: Leita Birmingham M.D.   On: 07/12/2024 16:08     Procedures   Medications Ordered in the ED  lidocaine  (LIDODERM ) 5 % 1 patch (1 patch Transdermal Patch Applied 07/12/24 1442)  ketorolac  (TORADOL ) 15 MG/ML injection 15 mg (15 mg Intramuscular Patient Refused/Not Given 07/12/24 1727)  HYDROcodone -acetaminophen  (NORCO/VICODIN) 5-325 MG per tablet 1 tablet (1 tablet Oral Given 07/12/24 1442)  methocarbamol  (ROBAXIN ) tablet 1,000 mg (1,000 mg Oral Given 07/12/24 1441)    Clinical Course as of 07/12/24 1804  Sat Jul 12, 2024  1639 Has  done well with Toradol  in past.  [JB]    Clinical Course User Index [JB] Adama Ivins, Warren SAILOR, PA-C                                 Medical Decision Making Amount and/or Complexity of Data Reviewed Labs: ordered. Radiology: ordered.  Risk Prescription drug management.   Isabella Buchanan 43 y.o. presented today for back pain. Working Ddx: MSK in nature, fracture, epidural hematoma/abscess, cauda equina syndrome, spinal stenosis, spinal malignancy, discitis, spinal infection, spondylitises/ spondylosis, conus medullaris, DDD of the back.  R/o DDx: Cauda equina syndrome and additional dx are less likely  than current impression due to history of present illness, physical exam, labs/imaging findings. No focal neurological deficits, no loss of bowel or bladder control.  Denies fever, night sweats, weight loss, h/o cancer, IVDU.   Imaging: Sacrum x-ray negative  Problem List / ED Course / Critical interventions / Medication management  Patient presented to emergency room with low back pain.  She has no abdominal pain nausea vomiting or diarrhea.  No urinary symptoms.  No CVA tenderness.  No midline tenderness or deformity.  No radicular symptoms no red flag symptoms that indicate patient would need emergent MRI.  He does have mild tenderness over muscle group that is worse with movement.  Patient has reassuring x-ray.  Feeling better after treatment here.  She reports that her back pain typically responds well to steroids thus we will try steroid taper as well as lidocaine  patches.  She has muscle relaxer and Norco at home.  She will follow-up with orthopedics.  Given return precautions.  Hemodynamically stable and well-appearing.  Ambulates with steady gait.       Final diagnoses:  Acute bilateral low back pain without sciatica    ED Discharge Orders          Ordered    predniSONE  (DELTASONE ) 20 MG tablet  Daily        07/12/24 1641    lidocaine  (LIDODERM ) 5 %  Every 24 hours         07/12/24 1641               Toini Failla, Warren SAILOR, PA-C 07/12/24 1806    Simon Lavonia SAILOR, MD 07/13/24 [phone]

## 2024-07-12 NOTE — ED Triage Notes (Signed)
 Pt c/o sacral pain that started yesterday. Pt stated she picks up a lot of heavy things at work.

## 2024-07-12 NOTE — Discharge Instructions (Signed)
 Your x-ray here is normal.  I do recommend following up with orthopedics.  Trial 5 days of steroids.  Continue using the lidocaine  patch, ice and heat.  Alternate Tylenol  and ibuprofen .  Return to ER with new or worsening symptoms.
